# Patient Record
Sex: Male | Born: 1960 | Race: White | Hispanic: No | Marital: Single | Smoking: Current every day smoker
Health system: Southern US, Community
[De-identification: ages and names within clinical notes are randomized; demographics above are authoritative.]

## PROBLEM LIST (undated history)

## (undated) DIAGNOSIS — M545 Low back pain, unspecified: Secondary | ICD-10-CM

## (undated) DIAGNOSIS — I10 Essential (primary) hypertension: Secondary | ICD-10-CM

## (undated) DIAGNOSIS — B019 Varicella without complication: Secondary | ICD-10-CM

## (undated) DIAGNOSIS — I739 Peripheral vascular disease, unspecified: Secondary | ICD-10-CM

## (undated) DIAGNOSIS — E139 Other specified diabetes mellitus without complications: Secondary | ICD-10-CM

## (undated) DIAGNOSIS — Z720 Tobacco use: Secondary | ICD-10-CM

## (undated) HISTORY — PX: INTRAOCULAR LENS INSERTION: SHX110

## (undated) HISTORY — DX: Varicella without complication: B01.9

## (undated) HISTORY — PX: CATARACT EXTRACTION: SUR2

## (undated) HISTORY — DX: Essential (primary) hypertension: I10

## (undated) HISTORY — PX: RETINAL DETACHMENT SURGERY: SHX105

## (undated) HISTORY — DX: Peripheral vascular disease, unspecified: I73.9

## (undated) HISTORY — DX: Other specified diabetes mellitus without complications: E13.9

## (undated) HISTORY — DX: Tobacco use: Z72.0

## (undated) HISTORY — DX: Low back pain, unspecified: M54.50

## (undated) HISTORY — DX: Low back pain: M54.5

---

## 1999-08-23 ENCOUNTER — Inpatient Hospital Stay (HOSPITAL_COMMUNITY): Admission: EM | Admit: 1999-08-23 | Discharge: 1999-08-24 | Payer: Self-pay | Admitting: Emergency Medicine

## 1999-08-24 ENCOUNTER — Encounter: Payer: Self-pay | Admitting: Internal Medicine

## 2000-05-19 ENCOUNTER — Inpatient Hospital Stay (HOSPITAL_COMMUNITY): Admission: EM | Admit: 2000-05-19 | Discharge: 2000-05-20 | Payer: Self-pay | Admitting: Emergency Medicine

## 2001-03-21 ENCOUNTER — Ambulatory Visit (HOSPITAL_COMMUNITY): Admission: RE | Admit: 2001-03-21 | Discharge: 2001-03-21 | Payer: Self-pay | Admitting: Family Medicine

## 2001-03-21 ENCOUNTER — Encounter: Payer: Self-pay | Admitting: Family Medicine

## 2003-04-28 ENCOUNTER — Emergency Department (HOSPITAL_COMMUNITY): Admission: EM | Admit: 2003-04-28 | Discharge: 2003-04-28 | Payer: Self-pay | Admitting: Emergency Medicine

## 2003-06-29 ENCOUNTER — Inpatient Hospital Stay (HOSPITAL_COMMUNITY): Admission: EM | Admit: 2003-06-29 | Discharge: 2003-07-01 | Payer: Self-pay | Admitting: Emergency Medicine

## 2004-01-13 ENCOUNTER — Emergency Department (HOSPITAL_COMMUNITY): Admission: EM | Admit: 2004-01-13 | Discharge: 2004-01-14 | Payer: Self-pay | Admitting: Emergency Medicine

## 2004-02-02 ENCOUNTER — Inpatient Hospital Stay (HOSPITAL_COMMUNITY): Admission: EM | Admit: 2004-02-02 | Discharge: 2004-02-04 | Payer: Self-pay | Admitting: Emergency Medicine

## 2005-03-07 ENCOUNTER — Emergency Department (HOSPITAL_COMMUNITY): Admission: EM | Admit: 2005-03-07 | Discharge: 2005-03-07 | Payer: Self-pay | Admitting: Family Medicine

## 2006-11-16 ENCOUNTER — Encounter
Admission: RE | Admit: 2006-11-16 | Discharge: 2007-02-14 | Payer: Self-pay | Admitting: Physical Medicine & Rehabilitation

## 2006-11-21 ENCOUNTER — Ambulatory Visit: Payer: Self-pay | Admitting: Physical Medicine & Rehabilitation

## 2007-07-16 ENCOUNTER — Encounter: Admission: RE | Admit: 2007-07-16 | Discharge: 2007-09-03 | Payer: Self-pay | Admitting: Anesthesiology

## 2007-11-28 ENCOUNTER — Ambulatory Visit: Payer: Self-pay | Admitting: Vascular Surgery

## 2009-09-08 ENCOUNTER — Encounter: Admission: RE | Admit: 2009-09-08 | Discharge: 2009-09-08 | Payer: Self-pay | Admitting: Endocrinology

## 2010-08-20 ENCOUNTER — Encounter
Admission: RE | Admit: 2010-08-20 | Discharge: 2010-08-20 | Payer: Self-pay | Source: Home / Self Care | Attending: Endocrinology | Admitting: Endocrinology

## 2010-09-17 ENCOUNTER — Other Ambulatory Visit: Payer: Self-pay | Admitting: Endocrinology

## 2010-09-21 ENCOUNTER — Other Ambulatory Visit: Payer: Self-pay | Admitting: Endocrinology

## 2010-09-21 DIAGNOSIS — T07XXXA Unspecified multiple injuries, initial encounter: Secondary | ICD-10-CM

## 2010-10-18 ENCOUNTER — Ambulatory Visit
Admission: RE | Admit: 2010-10-18 | Discharge: 2010-10-18 | Disposition: A | Discharge status: Home / Self Care | Payer: Medicare Other | Source: Ambulatory Visit | Attending: Endocrinology | Admitting: Endocrinology

## 2010-10-18 DIAGNOSIS — T07XXXA Unspecified multiple injuries, initial encounter: Secondary | ICD-10-CM

## 2010-12-24 NOTE — Discharge Summary (Signed)
NAME:  Peter Mendez, Peter Mendez                             ACCOUNT NO.:  [id number]   MEDICAL RECORD NO.:  [id number]                   PATIENT TYPE:  INP   LOCATION:  2023                                 FACILITY:  [facility]   PHYSICIAN:  Franchot Mimes, MD                   DATE OF BIRTH:  01-Oct-1960   DATE OF ADMISSION:  02/02/2004  DATE OF DISCHARGE:  02/04/2004                                 DISCHARGE SUMMARY   ATTENDING PHYSICIAN:  Dr. Lowella Bandy.   DATE OF ADMISSION:  February 02, 2004.   DATE OF DISCHARGE:  February 04, 2004.   DISCHARGE DIAGNOSES:  1. Diabetic ketoacidosis.  2. History of insulin dependent diabetes mellitus.  3. Acute renal failure.  4. Hypokalemia.  5. Hypertension.  6. 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   DISCHARGE MEDICATIONS:  1. Novolin N 30 units in A.M. and 15 units in P.M.  2. Novolin R 15 units in A.M. and 7 units in P.M.  3. Benazepril 10 mg p.o. q.d.  4. Xanax as needed.  5. Percocet as needed for pain.   DISPOSITION:  The patient is discharged home.   FOLLOW UP:  The patient is to follow up with Dr. Luciana Axe for follow-up of his  diabetes at the [facility].  In addition, the patient is to follow  up at [facility] for his continued eye care.   BRIEF HISTORY OF PRESENT ILLNESS:  Mr. Loudermilk is a 50 year old Caucasian male  who presented to [facility] Emergency Department with a three to four day  history of diarrhea, nausea and vomiting.  The patient states that following  his dinner on Friday, which was three days prior to admission, he began to  experience diarrhea.  On Sunday, two days later, he began to experience  vomiting and became dehydrated.  On the day of admission, he noticed that he  was starting to feel very sick and that he was probably going into acidosis,  thus prompting his visit to the emergency department.  The patient does not  report any missed insulin doses or running out of his medications.  He does  not report any  chest pain or shortness of breath with these episodes.  In  addition, he does not report any other fevers.  However, he was around his  mother and sister who had been having upper respiratory symptoms the few  days prior.  However, they had not been having diarrhea.  Thus, there is no  known trigger for his acidosis spell.   PHYSICAL EXAMINATION:  VITAL SIGNS:  Temperature 98.7, blood pressure  102/62, pulse 90, respirations 20, oxygen sat 98% on room air.  GENERAL:  Pleasant.  Awake, alert and oriented x3.  The patient was not in  any apparent distress.  HEENT:  The patient's pupils are minimally reactive.  There is edema  in the  right fundus with poor visualization of the vessels.  There is no  visualization of vessels with edema in the left fundus.  Oropharyngeal exam  revealed dry membranes with false teeth.  NECK:  No JVD, no lymphadenopathy and no thyromegaly.  LUNGS:  Respirations were clear to auscultation bilaterally.  CARDIOVASCULAR:  Regular rate with normal S1 and S2 and no murmurs.  GI:  Soft, nontender and nondistended.  There was no right upper quadrant  tenderness and no hepatosplenomegaly appreciated.  EXTREMITIES:  No edema with +2 dorsalis pedis pulses.  SKIN:  No ulcerations.  However, there were multiple bilateral ecchymoses of  the lower extremities.  The patient is rather tan in his upper extremities.  NEUROLOGIC:  Cranial nerves II-XII are intact.  Detailed exam:  The  patient's gross sensation is slightly decreased in his lower extremities and  he does complain of occasional neuropathic pain in his lower extremities.  His strength was grossly normal.   ADMISSION LABORATORY DATA:  Admission ABG:  His pH 7.30, pCO2 33, pO2 102,  bicarb 16 on room air.  Sodium 143, potassium 4.0, chloride 103, bicarb 19,  BUN 36, creatinine 2.0, glucose 361.  White blood cell count 12, hemoglobin  15.8, BUN 46, platelets 273.  AST 15, ALT 13, protein 5.8, albumin 3.3,  bilirubin  2.3, alkaline phosphatase 62.  The patient had an anion gap of 21.  He had a large amount of acetone in his blood.  Urinalysis revealed a  specific gravity of 1.028, greater than 1,000 glucose, greater than 80  ketone, 30 of protein with negative nitrite and negative leukocyte esterase.   DIAGNOSES:  1. Diabetic ketoacidosis.  The patient had a very mild ketoacidosis on     presentation.  He was started on an insulin drip at 6 units per hour with     CBGs checked every hour.  The patient was given 4 liters of normal saline     while in the emergency department and then was started on 200 cc of     normal saline for IV fluid resuscitation.  Once the patient's glucose was     less than 300, the IV fluids were changed to D5 1/2 normal saline.  The     patient's glucose continued to follow and the insulin drip was adjusted     accordingly.  Once the patient's bicarb was greater than 20, the insulin     drip was continued for one additional hour and then stopped.  At that     time, he was given 10 units of NPH and his CBGs were checked every four     hours with sliding scale of regular insulin.  The following morning after     admission the patient had no nausea, was feeling much better and his     anion gap had completely closed and his bicarb was normal.  Thus we     advanced his diet as tolerated.  The patient advanced his diet without     complication.  On the day of discharge, the patient's  anion gap was 4     and his bicarb was 31.  He did have a slightly low potassium at 3.2.  He     also had potassium in his IV fluids and this was also replaced orally.     On the day of discharge, the patient was on his home regimen of insulin     with adequate control  of his blood sugars.  2. Acute renal failure.  Following aggressive IV fluid resuscitation, the     patient's creatinine decreased from 2.0 to a level of 0.9 on discharge.    A microalbumin was obtained and the patient was found to have  30 mg per     24 hours of albumin in his urine.  Thus, there is no concern for     nephropathic disease.  3. Hypokalemia.  As stated above, the patient's potassium was 3.2 at     discharge.  The patient will be given a small amount of potassium for     continued supplementation as an outpatient.   ADDITIONAL LABORATORY DATA:  The patient's hemoglobin A1c on admission was  10.7.                                                Franchot Mimes, MD    TV/MEDQ  D:  02/04/2004  T:  02/04/2004  Job:  [id number]   cc:   Fanny Dance. Rankins, M.D.  [address]  [place]  [place] [address]  Fax: [phone]
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
## 2010-12-24 NOTE — Discharge Summary (Signed)
NAME:  Peter Mendez, Peter Mendez                             ACCOUNT NO.:  [id number]   MEDICAL RECORD NO.:  [id number]                   PATIENT TYPE:  INP   LOCATION:  2905                                 FACILITY:  [facility]   PHYSICIAN:  Alvester Morin, M.D.               DATE OF BIRTH:  October 13, 1960   DATE OF ADMISSION:  06/29/2003  DATE OF DISCHARGE:  07/01/2003                                 DISCHARGE SUMMARY   DISCHARGE DIAGNOSES:  1. Diabetes mellitus, insulin dependent--diabetic ketoacidosis.  2. Nausea and vomiting.  3. History of febrile illness.  4. Anemia.  5. Hypertension (?).   DISCHARGE MEDICATIONS:  1. Ten units of Regular insulin q.a.m., 30 units of NPH insulin q.a.m.; 16     units of NPH q.p.m., 10 units of Regular insulin q.p.m.  2. Xanax 1 mg t.i.d.  3. Percocet 10/325 mg one t.i.d. p.r.n. pain.   ADMITTING HISTORY AND PHYSICAL:  The patient presented to the ED with a four-  day history of nausea, vomiting and diarrhea.  The patient reports that he  was unable to give himself insulin due to blurry vision and blindness in his  left eye.  He reports a positive history of fever and chills during this  illness.   HOSPITAL COURSE:  PROBLEM #1 - DIABETIC KETOACIDOSIS:  The patient was  appropriately hydrated with normal saline and subsequently half normal  saline with D-5.  A 10-unit insulin bolus was given upon admission and then  the patient was started on an insulin drip.  Serum electrolytes were monitor  and repleted as necessary.  The patient was given subcu insulin and  subsequently the insulin drip was discontinued on discharge.  The patient  was discharged after tolerating adequate p.o. intake on his home insulin  regimen.  Labs on the day of discharge:  CMET was as follows on insulin  drip:  Sodium 135, potassium 3.4, chloride 99, CO2 31, BUN 14, creatinine  0.8, glucose 150, anion gap of 5.  Please note that the patient's potassium  was repleted with 40 mEq of p.o.  K-Dur prior to discharge.   PROBLEM #2 - NAUSEA AND VOMITING:  Initially, the patient was treated with  IV Phenergan 12.5 mg q.6 h. p.r.n., however, nausea, vomiting and diarrhea  symptoms resolved within the first 24 hours of admission.   PROBLEM #3 - HISTORY OF FEBRILE ILLNESS:  The patient was cultured, blood  cultures x2.  Chest film, UA and CBC with differential.  Blood cultures are  negative to date and chest x-ray showed no acute disease.  Urinalysis was  positive for greater than 1000 of glucose and a small amount of hemoglobin,  moderate bilirubin and greater than 80 ketones and 30 of protein; this is  likely due to his state of DKA, but the UA was negative for leukocyte  esterase and nitrite and had 0 to  2 white blood cells, so it was not felt  that he had a urinary tract infection at the time that can be attributed to  his signs and symptoms of febrile illness.  CBC with differential:  Initial  CBC had a white count of 13, hemoglobin 16.9, hematocrit 50.6 and a platelet  count of 257,000 with an absolute neutrophil count of 11.5.  His MCV at that  time was 89.0.  The patient did have an elevated white count, however, he  was dehydrated and in a state of DKA, which also can cause an elevation in  the white count.  He did have an elevated absolute neutrophil count at that  time.  This was monitored closely and on day of discharge, the patient had a  normal white count of 7.6, a hemoglobin of 12.5, hematocrit 36 and platelet  count of 147,000.  The patient is mildly anemic at 12.5 and this may need to  be followed by his primary care physician, Dr. Fanny Dance. Rankins, at  [facility].   PROBLEM #4 - ? ANEMIA:  The patient had a hemoglobin of 12.5 on discharge,  which is a mild anemia.  He will be followed by his primary care physician,  Dr. Barbaraann Barthel, at [facility].   PROBLEM #5 - HYPERTENSION:  The patient did have some elevated blood  pressures during his stay with ranges on  the first day of admission from 120  to 160s over 60s to 70s and on day of discharge, 140s over 60s to 70s and  this should be followed by his primary care physician, Dr. Barbaraann Barthel, at  [facility].   DISPOSITION:  The patient is discharged to home in stable condition with the  following instructions.   FOLLOWUP:  [name] to follow up at [facility] Tuesday morning at  8:30 a.m. on the July 08, 2003 with his primary care physician, Dr  Barbaraann Barthel, and also with the diabetes coordinator there.  This appointment has  already been established for the patient.   DIET AND ACTIVITY:  [name] to follow a diabetic-prudent diet and to return to  activity as normal.      Donald Pore, MD                          Alvester Morin, M.D.    HP/MEDQ  D:  07/01/2003  T:  07/01/2003  Job:  [id number]

## 2011-11-15 ENCOUNTER — Ambulatory Visit: Payer: Medicare Other | Attending: Ophthalmology | Admitting: Occupational Therapy

## 2011-11-15 DIAGNOSIS — E11319 Type 2 diabetes mellitus with unspecified diabetic retinopathy without macular edema: Secondary | ICD-10-CM | POA: Insufficient documentation

## 2011-11-15 DIAGNOSIS — H5410 Blindness, one eye, low vision other eye, unspecified eyes: Secondary | ICD-10-CM | POA: Insufficient documentation

## 2011-11-15 DIAGNOSIS — E1139 Type 2 diabetes mellitus with other diabetic ophthalmic complication: Secondary | ICD-10-CM | POA: Insufficient documentation

## 2011-11-15 DIAGNOSIS — IMO0001 Reserved for inherently not codable concepts without codable children: Secondary | ICD-10-CM | POA: Insufficient documentation

## 2013-02-20 ENCOUNTER — Other Ambulatory Visit (INDEPENDENT_AMBULATORY_CARE_PROVIDER_SITE_OTHER): Payer: Medicare Other

## 2013-02-20 ENCOUNTER — Other Ambulatory Visit: Payer: Self-pay | Admitting: *Deleted

## 2013-02-20 DIAGNOSIS — E1065 Type 1 diabetes mellitus with hyperglycemia: Secondary | ICD-10-CM

## 2013-02-20 DIAGNOSIS — IMO0002 Reserved for concepts with insufficient information to code with codable children: Secondary | ICD-10-CM

## 2013-02-20 DIAGNOSIS — E119 Type 2 diabetes mellitus without complications: Secondary | ICD-10-CM

## 2013-02-20 LAB — COMPREHENSIVE METABOLIC PANEL
ALT: 13 U/L (ref 0–53)
AST: 22 U/L (ref 0–37)
Albumin: 4 g/dL (ref 3.5–5.2)
BUN: 7 mg/dL (ref 6–23)
CO2: 29 mEq/L (ref 19–32)
Calcium: 9.1 mg/dL (ref 8.4–10.5)
Chloride: 95 mEq/L — ABNORMAL LOW (ref 96–112)
Creatinine, Ser: 0.8 mg/dL (ref 0.4–1.5)
GFR: 102.81 mL/min (ref >60.00)
Potassium: 5 mEq/L (ref 3.5–5.1)

## 2013-02-20 LAB — MICROALBUMIN / CREATININE URINE RATIO
Microalb Creat Ratio: 2.6 mg/g (ref 0.0–30.0)
Microalb, Ur: 4.6 mg/dL — ABNORMAL HIGH (ref 0.0–1.9)

## 2013-02-20 LAB — URINALYSIS
Bilirubin Urine: NEGATIVE
Specific Gravity, Urine: 1.02 (ref 1.000–1.030)
Total Protein, Urine: NEGATIVE
pH: 6 (ref 5.0–8.0)

## 2013-02-20 LAB — HEMOGLOBIN A1C: Hgb A1c MFr Bld: 8.2 % — ABNORMAL HIGH (ref 4.6–6.5)

## 2013-02-25 ENCOUNTER — Ambulatory Visit (INDEPENDENT_AMBULATORY_CARE_PROVIDER_SITE_OTHER): Payer: Medicare Other | Admitting: Endocrinology

## 2013-02-25 ENCOUNTER — Encounter: Payer: Self-pay | Admitting: Endocrinology

## 2013-02-25 VITALS — BP 128/70 | HR 63 | Ht 70.0 in | Wt 151.6 lb

## 2013-02-25 DIAGNOSIS — E871 Hypo-osmolality and hyponatremia: Secondary | ICD-10-CM | POA: Insufficient documentation

## 2013-02-25 DIAGNOSIS — E7800 Pure hypercholesterolemia, unspecified: Secondary | ICD-10-CM | POA: Insufficient documentation

## 2013-02-25 DIAGNOSIS — I10 Essential (primary) hypertension: Secondary | ICD-10-CM | POA: Insufficient documentation

## 2013-02-25 DIAGNOSIS — E1065 Type 1 diabetes mellitus with hyperglycemia: Secondary | ICD-10-CM

## 2013-02-25 DIAGNOSIS — E139 Other specified diabetes mellitus without complications: Secondary | ICD-10-CM | POA: Insufficient documentation

## 2013-02-25 DIAGNOSIS — E1049 Type 1 diabetes mellitus with other diabetic neurological complication: Secondary | ICD-10-CM | POA: Insufficient documentation

## 2013-02-25 NOTE — Progress Notes (Signed)
Patient ID: Peter Mendez, male   DOB: May 19, 1961, 52 y.o.   MRN: [id number]  Reason for Appointment: Diabetes follow-up   History of Present Illness    Diagnosis: Type 1 diabetes mellitus.   Complications: retinopathy, peripheral neuropathy.  Diabetic nephropathy: None, Last microalbumin normal.   Insulin delivery device: Pens.  Monitors blood glucose: 6.5 times a day.  Glucometer: One Touch.   Blood Glucose readings: Overall median 178 Highest blood sugars are after about 7 PM with median 206 at bedtime and also readings with averages about 200 late evening and overnight as well as early morning Blood sugars are the lowest before supper with median 127  Hypoglycemia frequency: None recently, was having some low readings may be about 2 weeks ago along with one reading of 64 at 2 AM  Hypoglycemic awareness: Usually develops symptoms when blood glucose is less than 50, usually sweating.   Meals: Inconsistent schedule, Inconsistent quantity and sometimes eating snacks at night.  Carbohydrate intake: at snacks: some during the night with milk. Also diet may be high in fat at times.  Physical activity: exercise: walking very little.  Dietician visit: Most recent:, 4/09.   His A1c previously was consistently around 7.3 along with  marked fluctuation in blood sugars. However it is now relatively higher with somewhat higher readings recently especially overnight [name] still trying to monitor about 7 times a day to help with control but blood sugars can be as high as 435 and as low as 36 Highest blood sugars are late evening and overnight as well as some in the morning lately He has not increased his Lantus even with his morning sugars being high Some of  the high readings at breakfast may be from eating a snack during the night without taking any insulin and also not getting enough insulin to cover his evening meal  He was switched from NovoLog and Humalog because of insurance preference. Overall  he thinks he is needing to take more Humalog and NovoLog around mealtime for control  The insulin regimen is described as premeal rapid analog 6-8 usually, Lantus 35 in a.m. and 23 p.m.Marland Kitchen  Prior A1c readings usually 7.2-7.4  Hypertension:  Here for hypertension followup: BP is now well-controlled with good reading today. Current regimen is  ramipril 10 mg. 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 Cortrosyn stimulation test was also normal Fluid intake has been better restricted and he is not getting Gatorade and other drinks as much. May drink milk at night. Urinary sodium previously was 138 and U Osm 286 in 3/13. Demeclocycline was prescribed but is not covered by insurance. Again currently asymptomatic and has no nausea or weight loss.    Appointment on 02/20/2013  Component Date Value Range Status  . Hemoglobin A1C 02/20/2013 8.2* 4.6 - 6.5 % Final   Glycemic Control Guidelines for People with Diabetes:Non Diabetic:  <6%Goal of Therapy: <7%Additional Action Suggested:  >8%   . Microalb, Ur 02/20/2013 4.6* 0.0 - 1.9 mg/dL Final  . Creatinine,U 40/98/1191 180.0   Final  . Microalb Creat Ratio 02/20/2013 2.6  0.0 - 30.0 mg/g Final  . Color,  Urine 02/20/2013 YELLOW  Yellow;Lt. Yellow Final  . APPearance 02/20/2013 CLEAR  Clear Final  . Specific Gravity, Urine 02/20/2013 1.020  1.000-1.030 Final  . pH 02/20/2013 6.0  5.0 - 8.0 Final  . Total Protein, Urine 02/20/2013 NEGATIVE  Negative Final  . Urine Glucose 02/20/2013 NEGATIVE  Negative Final  . Ketones, ur 02/20/2013 TRACE  Negative Final  . Bilirubin Urine  02/20/2013 NEGATIVE  Negative Final  . Hgb urine dipstick 02/20/2013 NEGATIVE  Negative Final  . Urobilinogen, UA 02/20/2013 1.0  0.0 - 1.0 Final  . Leukocytes, UA 02/20/2013 NEGATIVE  Negative Final  . Nitrite 02/20/2013 NEGATIVE  Negative Final  . Sodium 02/20/2013 130* 135 - 145 mEq/L Final  . Potassium 02/20/2013 5.0  3.5 - 5.1 mEq/L Final  . Chloride 02/20/2013 95* 96 - 112 mEq/L Final  . CO2 02/20/2013 29  19 - 32 mEq/L Final  . Glucose, Bld 02/20/2013 139* 70 - 99 mg/dL Final  . BUN 78/46/9629 7  6 - 23 mg/dL Final  . Creatinine, Ser 02/20/2013 0.8  0.4 - 1.5 mg/dL Final  . Total Bilirubin 02/20/2013 0.5  0.3 - 1.2 mg/dL Final  . Alkaline Phosphatase 02/20/2013 63  39 - 117 U/L Final  . AST 02/20/2013 22  0 - 37 U/L Final  . ALT 02/20/2013 13  0 - 53 U/L Final  . Total Protein 02/20/2013 7.3  6.0 - 8.3 g/dL Final  . Albumin 52/84/1324 4.0  3.5 - 5.2 g/dL Final  . Calcium 40/05/2724 9.1  8.4 - 10.5 mg/dL Final  . GFR 36/64/4034 102.81  >60.00 mL/min Final      Medication List       This list is accurate as of: 02/25/13  2:38 PM.  Always use your most recent med list.               alprazolam 2 MG tablet  Commonly known as:  XANAX  2 mg.     HUMALOG KWIKPEN 100 UNIT/ML Sopn  Generic drug:  insulin lispro     LANTUS SOLOSTAR 100 UNIT/ML Sopn  Generic drug:  Insulin Glargine  34 Units.     oxycodone 30 MG immediate release tablet  Commonly known as:  ROXICODONE     pravastatin 40 MG tablet  Commonly known as:  PRAVACHOL     ramipril 10 MG capsule  Commonly known as:  ALTACE        Allergies: No Known Allergies  No past medical history on file.  No past surgical history on file.  No family history on file.  Social History:  reports that he has been smoking.  He does not have any smokeless tobacco history on file. His alcohol and drug histories are not on file.  Review of Systems   Chronic back pain Visual loss from retinopathy He has had symptoms  of neuropathy    Examination:   BP 128/70  Pulse 63  Ht 5\' 10"  (1.778 m)  Wt 151 lb 9.6 oz (68.765 kg)  BMI 21.75 kg/m2  SpO2 98%  Body mass index is 21.75 kg/(m^2).   Assesment:   1. Diabetes type 2, uncontrolled - 250.02  The patient's diabetes control appears to be not as well controlled with higher A1c  Highest blood sugars around 6-a.m. overall with median 235 which he thinks is from eating a snack or drinking milk during the night without taking any coverage. However may also have an element of a dawn effect. Also has occasional blood  sugars in the 40s, usually before noon or once at 4 p.m.. Overall he thinks he is taking a little more Humalog and NovoLog 4 mealtime control but since his overall control is similar will not request a change from insurance company. [name] taking fairly consistent amounts of Lantus for quite some time and will continue the same doses for now. Will avoiding treating evening doses because of some n low readings overnight also. Suppertime readings are generally fairly good also.   2.  HYPONATREMIA: on his blood test the sodium is now 130 compared to 132 previously. Has not changed his diet or fluid intake and is more consistently normal now. Also was previously  asymptomatic with lower levels and we'll continue to observe   3. Hypertension is well controlled  PLAN:   Robb Sibal 02/25/2013, 2:38 PM

## 2013-02-25 NOTE — Patient Instructions (Signed)
Humalog 6-8 at supper  Lantus 36 in am and 25 in pm  If eating during night take 2-3 units Humalog

## 2013-04-09 ENCOUNTER — Other Ambulatory Visit: Payer: Self-pay | Admitting: *Deleted

## 2013-04-09 MED ORDER — INSULIN GLARGINE 100 UNIT/ML SOLOSTAR PEN: 34.0000 [IU] | PEN_INJECTOR | Freq: Two times a day (BID) | SUBCUTANEOUS | Status: DC

## 2013-04-09 MED ORDER — "PEN NEEDLES 5/16"" 31G X 8 MM MISC": Status: DC

## 2013-04-09 MED ORDER — INSULIN LISPRO 100 UNIT/ML (KWIKPEN): PEN_INJECTOR | SUBCUTANEOUS | Status: DC

## 2013-04-09 MED ORDER — GLUCOSE BLOOD VI STRP: ORAL_STRIP | Status: DC

## 2013-04-12 ENCOUNTER — Telehealth: Payer: Self-pay | Admitting: Endocrinology

## 2013-04-12 MED ORDER — GLUCOSE BLOOD VI STRP: ORAL_STRIP | Status: DC

## 2013-04-12 NOTE — Telephone Encounter (Signed)
Call pharmacy, faxed over a corrected version of his test strips, his insurance will only pay for 5 pens of Lantus at one time.  Tried calling patient back but no answer

## 2013-04-29 ENCOUNTER — Encounter: Payer: Self-pay | Admitting: Endocrinology

## 2013-04-29 ENCOUNTER — Ambulatory Visit (INDEPENDENT_AMBULATORY_CARE_PROVIDER_SITE_OTHER): Payer: Medicare Other | Admitting: Endocrinology

## 2013-04-29 VITALS — BP 130/70 | HR 75 | Temp 98.4°F | Ht 70.0 in | Wt 145.9 lb

## 2013-04-29 DIAGNOSIS — IMO0002 Reserved for concepts with insufficient information to code with codable children: Secondary | ICD-10-CM

## 2013-04-29 DIAGNOSIS — E871 Hypo-osmolality and hyponatremia: Secondary | ICD-10-CM

## 2013-04-29 DIAGNOSIS — I10 Essential (primary) hypertension: Secondary | ICD-10-CM

## 2013-04-29 DIAGNOSIS — E1065 Type 1 diabetes mellitus with hyperglycemia: Secondary | ICD-10-CM

## 2013-04-29 DIAGNOSIS — E7800 Pure hypercholesterolemia, unspecified: Secondary | ICD-10-CM

## 2013-04-29 LAB — MICROALBUMIN / CREATININE URINE RATIO
Creatinine,U: 118.7 mg/dL
Microalb, Ur: 5.6 mg/dL — ABNORMAL HIGH (ref 0.0–1.9)

## 2013-04-29 LAB — URINALYSIS, ROUTINE W REFLEX MICROSCOPIC
Hgb urine dipstick: NEGATIVE
Leukocytes, UA: NEGATIVE
Nitrite: NEGATIVE
Specific Gravity, Urine: 1.02 (ref 1.000–1.030)
Urobilinogen, UA: 0.2 (ref 0.0–1.0)

## 2013-04-29 NOTE — Patient Instructions (Addendum)
Add 1-2 units at lunch if eating starchy foods  Cut smoking down  Mark sugar after meals on meter

## 2013-04-29 NOTE — Progress Notes (Signed)
Patient ID: Peter Mendez, male   DOB: 1960-12-24, 52 y.o.   MRN: [id number]  Reason for Appointment: Diabetes follow-up   History of Present Illness   Diagnosis: Type 1 diabetes mellitus, diagnosis 1991.  He has been on basal bolus insulin regimen for a few years His A1c previously was consistently around 7.3 along with marked fluctuation in blood sugars. However A1c has been relatively higher this year.  On his last visit he was advised to cover his snacks better and he is also avoiding snacks during the night. Although blood sugars are averaging 168 he still has significant fluctuation [name] still trying to monitor about 6-7 times a day to help with control  His and median blood sugar is usually about 170 most of the time except lower late morning and midday Highest blood sugars are late evening and overnight as well as some in the morning again More recently he has had relatively high readings after lunch and has not increased the coverage for his meal. Usually compliant with taking the NovoLog with his meal and trying to adjust it based on meal size  The insulin regimen is described as premeal rapid analog 6-8 usually, Lantus 35 in a.m. and 23 p.m..  Insulin delivery device: Pens.  Monitors blood glucose: 6.5 times a day.  Glucometer: One Touch.   Blood Glucose readings: Overall median 167, previously 178 with median about 170-175 except late morning and mid day Recent readings before breakfast 123-229, midday 79-224, afternoon recently 149-229. Early evening 60-253 and late evening/bedtime 75-284 Overnight 56-337  Hypoglycemia frequency: None recently, was having some low readings may be about 2 weeks ago along with one reading of 64 at 2 AM  Hypoglycemic awareness: Usually develops symptoms when blood glucose is less than 50, usually sweating.   Meals: Inconsistent schedule, Inconsistent quantity and sometimes eating snacks at night. bfst 7-9 am Supper 6-7 pm  1/2 sandwich hs;  no  night snack Carbohydrate intake: at snacks: some during the night with milk. More chicken [place]  Physical activity: exercise: walking some.  Dietician visit: Most recent:, 4/09.   Prior A1c readings usually 7.2-7.4 Complications: retinopathy, peripheral neuropathy.  Diabetic nephropathy: None, Last microalbumin normal.   Hypertension:  Here for hypertension followup: BP is now well-controlled with good reading today. Current regimen is  ramipril 10 mg only. Home blood pressure checks about 130/60  HYPONATREMIA: Long-standing and it appears idiopathic. Appears sodium was consistently below 130 previously it is better more recently without any specific intervention. 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 Cortrosyn stimulation test was also normal Fluid intake has been better restricted and he is not drinking Gatorade as before. May drink milk at night. Urinary sodium previously was 138 and U Osm 286 in 3/13. Demeclocycline was prescribed but is not covered by insurance.      Medication List       This list is accurate as of: 04/29/13  3:13 PM.  Always use your most recent med list.               alprazolam 2 MG tablet  Commonly known as:  XANAX  2 mg.     glucose blood test strip  Use to check blood sugars 7 times per day dx code 250.03     Insulin Glargine 100 UNIT/ML Sopn  Commonly known as:  LANTUS SOLOSTAR  Inject 34 Units into the skin  2 (two) times daily.     insulin lispro 100 UNIT/ML Sopn  Commonly known as:  HUMALOG KWIKPEN  Use as directed     oxycodone 30 MG immediate release tablet  Commonly known as:  ROXICODONE     PEN NEEDLES 31GX5/16" 31G X 8 MM Misc  Use to check blood sugar 7 times per day     pravastatin 40 MG tablet  Commonly known as:  PRAVACHOL     ramipril 10 MG capsule  Commonly known as:  ALTACE        Allergies: No Known Allergies  History  reviewed. No pertinent past medical history.  History reviewed. No pertinent past surgical history.  History reviewed. No pertinent family history.  Social History:  reports that he has been smoking.  He does not have any smokeless tobacco history on file. His alcohol and drug histories are not on file.  Review of Systems   Chronic back pain Visual loss from retinopathy He has had symptoms of neuropathy    Examination:   BP 130/70  Pulse 75  Temp(Src) 98.4 F (36.9 C) (Oral)  Ht 5\' 10"  (1.778 m)  Wt 145 lb 14.4 oz (66.18 kg)  BMI 20.93 kg/m2  SpO2 96%  Body mass index is 20.93 kg/(m^2).   Assesment: 9/14  1. Diabetes type 2, uncontrolled - 250.02  The patient's diabetes control appears to be about the same as usual with fluctuation in blood sugars at all different times. His median reading is about 170 most of the time but relatively lower between about 9 AM-1 PM Most recently appears to be having significantly high readings mid afternoon from his lunch although readings around supper time are sometimes high also. Discussed needing 1 to 2 units at least more at lunch time especially if eating any starch [name] significantly afraid of hypoglycemia and probably did not take enough coverage for his meals and snacks especially late at night. FASTING readings are relatively better recently including a low sugar of 56 at 5 AM; also readings before supper or near normal last 3 days; for this reason will continue his Lantus unchanged   2.  HYPONATREMIA: This is the idiopathic. Possibly some degree of SIADH from taking chronic narcotic pain medications  Last level was 130 compared to 132 previously. Has not changed his diet or fluid intake. He been asymptomatic even with lower levels and will continue to observe   3. Hypertension is well controlled with ramipril alone  PLAN:   Peter Mendez 04/29/2013, 3:13 PM

## 2013-04-30 LAB — BASIC METABOLIC PANEL
BUN: 9 mg/dL (ref 6–23)
Chloride: 95 mEq/L — ABNORMAL LOW (ref 96–112)
Creatinine, Ser: 0.9 mg/dL (ref 0.4–1.5)
GFR: 91.22 mL/min (ref >60.00)
Potassium: 5.1 mEq/L (ref 3.5–5.1)

## 2013-04-30 LAB — LIPID PANEL
Cholesterol: 150 mg/dL (ref 0–200)
LDL Cholesterol: 93 mg/dL (ref 0–99)
Triglycerides: 34 mg/dL (ref 0.0–149.0)

## 2013-05-01 ENCOUNTER — Telehealth: Payer: Self-pay | Admitting: *Deleted

## 2013-05-01 NOTE — Telephone Encounter (Signed)
Left message to return call 

## 2013-05-01 NOTE — Telephone Encounter (Signed)
Message copied by Hermenia Bers on Wed May 01, 2013  2:19 PM ------      Message from: Reather Littler      Created: Wed May 01, 2013 12:52 PM       Notify A1c 8.0 ------

## 2013-07-29 ENCOUNTER — Ambulatory Visit (INDEPENDENT_AMBULATORY_CARE_PROVIDER_SITE_OTHER): Payer: Medicare Other | Admitting: Endocrinology

## 2013-07-29 ENCOUNTER — Encounter: Payer: Self-pay | Admitting: Endocrinology

## 2013-07-29 VITALS — BP 132/82 | HR 61 | Temp 98.2°F | Resp 12 | Ht 70.0 in | Wt 158.1 lb

## 2013-07-29 DIAGNOSIS — E1065 Type 1 diabetes mellitus with hyperglycemia: Secondary | ICD-10-CM

## 2013-07-29 DIAGNOSIS — E7800 Pure hypercholesterolemia, unspecified: Secondary | ICD-10-CM

## 2013-07-29 DIAGNOSIS — E871 Hypo-osmolality and hyponatremia: Secondary | ICD-10-CM

## 2013-07-29 DIAGNOSIS — I10 Essential (primary) hypertension: Secondary | ICD-10-CM

## 2013-07-29 DIAGNOSIS — E1049 Type 1 diabetes mellitus with other diabetic neurological complication: Secondary | ICD-10-CM

## 2013-07-29 LAB — COMPREHENSIVE METABOLIC PANEL
AST: 25 U/L (ref 0–37)
Albumin: 4.1 g/dL (ref 3.5–5.2)
BUN: 5 mg/dL — ABNORMAL LOW (ref 6–23)
CO2: 33 mEq/L — ABNORMAL HIGH (ref 19–32)
Calcium: 8.8 mg/dL (ref 8.4–10.5)
Chloride: 89 mEq/L — ABNORMAL LOW (ref 96–112)
Creatinine, Ser: 1 mg/dL (ref 0.4–1.5)
GFR: 81.83 mL/min (ref >60.00)
Glucose, Bld: 210 mg/dL — ABNORMAL HIGH (ref 70–99)
Potassium: 4.4 mEq/L (ref 3.5–5.1)

## 2013-07-29 NOTE — Progress Notes (Signed)
Patient ID: Peter Mendez, male   DOB: 07/13/1961, 52 y.o.   MRN: [id number]  Reason for Appointment: Diabetes follow-up   History of Present Illness   Diagnosis: Type 1 diabetes mellitus, diagnosis 1991.  He has been on basal bolus insulin regimen for a few years His A1c previously was consistently around 7.3 along with marked fluctuation in blood sugars. He has difficulty giving blood sugars consistent around mealtimes and estimating the amount of insulin needed for various kinds of foods A1c has been higher in 2014.  The insulin regimen is described as premeal rapid analog 6-8 usually, Lantus 35 in a.m. and 23 p.m..  On his last visit he was advised to cover his snacks better and also recommended avoiding snacks during the night without coverage. Current problems:  Significant variation in blood sugars especially around breakfast time. He tends to eat cereal at breakfast and appears to have some high readings with this  Blood sugars are trending higher in the afternoon with median reading 217 and this is the highest in the day; blood sugars are not always high right after lunch  Relatively high readings before supper  Tendency to low normal or low readings around lunchtime possibly from not getting enough protein at breakfast  Significant variability in blood sugars after supper although median reading is only 161. Has a most variability at this time  Not being able to estimate accurately how to cover his meals and snacks as he is not doing carbohydrate counting Usually compliant with taking the NovoLog with his meal and trying to adjust it based on meal size  [name] still trying to monitor about 6-7 times a day to help with control Blood Glucose readings:   PREMEAL Breakfast Lunch Dinner Bedtime Overall  Glucose range:  110-322   46-289   141-221   163-206    Mean/median:  208   126   176    176    POST-MEAL PC Breakfast PC Lunch PC Dinner  Glucose range:  56-290   104-312     Mean/median:   217     Hypoglycemia frequency: Infrequent recently, having occasional low sugars late morning or before lunch  Hypoglycemic awareness: Usually develops symptoms when blood glucose is less than 50, usually sweating.   Meals: Inconsistent schedule, Inconsistent quantity and sometimes eating "junk". Breakfast is at 6-8 am Supper 6-7 pm  Cereal in ams; 1/2 sandwich hs;  no night snack Carbohydrate intake: at snacks: some during the night with milk. Eating more chicken and [place], less meat  Physical activity: exercise: walking some.  Dietician visit: Most recent:, 4/09.   Wt Readings from Last 3 Encounters:  07/29/13 158 lb 1.6 oz (71.714 kg)  04/29/13 145 lb 14.4 oz (66.18 kg)  02/25/13 151 lb 9.6 oz (68.765 kg)    Lab Results  Component Value Date   HGBA1C 7.8* 07/29/2013   HGBA1C 8.0* 04/29/2013   HGBA1C 8.2* 02/20/2013   Lab Results  Component Value Date   MICROALBUR 5.6* 04/29/2013   LDLCALC 93 04/29/2013   CREATININE 1.0 07/29/2013    Complications: retinopathy, peripheral neuropathy.  Diabetic nephropathy: None, Last microalbumin normal.    HYPONATREMIA: Long-standing and it appears idiopathic. Appears sodium was consistently below 130 previously it is better more recently without any specific intervention. 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 Cortrosyn stimulation test was also normal Fluid intake has been better restricted  and he is not drinking Gatorade as before. May drink milk at night. Urinary sodium previously was 138 and U Osm 286 in 3/13. Demeclocycline was prescribed but is not covered by insurance.      Medication List       This list is accurate as of: 07/29/13  3:39 PM.  Always use your most recent med list.               alprazolam 2 MG tablet  Commonly known as:  XANAX  2 mg.     glucose blood test strip  Use to check blood sugars 7 times per  day dx code 250.03     Insulin Glargine 100 UNIT/ML Sopn  Commonly known as:  LANTUS SOLOSTAR  Inject 34 Units into the skin 2 (two) times daily.     insulin lispro 100 UNIT/ML Sopn  Commonly known as:  HUMALOG  4-6 Units. Use as directed     oxycodone 30 MG immediate release tablet  Commonly known as:  ROXICODONE     PEN NEEDLES 31GX5/16" 31G X 8 MM Misc  Use to check blood sugar 7 times per day     pravastatin 40 MG tablet  Commonly known as:  PRAVACHOL     ramipril 10 MG capsule  Commonly known as:  ALTACE        Allergies: No Known Allergies  No past medical history on file.  No past surgical history on file.  No family history on file.  Social History:  reports that he has been smoking.  He does not have any smokeless tobacco history on file. His alcohol and drug histories are not on file.  Review of Systems   Chronic back pain Visual loss from retinopathy He has had symptoms of neuropathy, diabetic foot exam on 07/29/13 HYPERTENSION: Well controlled, he also monitors periodically at drug store . Current regimen is  ramipril 10 mg only. Home blood pressure checks about 130/60   Examination:   BP 132/82  Pulse 61  Temp(Src) 98.2 F (36.8 C)  Resp 12  Ht 5\' 10"  (1.778 m)  Wt 158 lb 1.6 oz (71.714 kg)  BMI 22.69 kg/m2  SpO2 98%  Body mass index is 22.69 kg/(m^2).   Diabetic foot exam done No ankle edema present  Assesment/PLAN:  1. Diabetes type 2, uncontrolled - 250.02  The patient's diabetes control appears to be slightly better overall but with fluctuation in blood sugars at all different times. See history of present illness for detailed discussion of current blood sugar patterns and problems identified He does need to modify his breakfast and have more protein instead of eating cereal Also because of higher readings before supper will increase his LANTUS by 2 units in the morning, advised him to possibly lower the evening dose if overnight  readings start getting at least below 100 [name] significantly afraid of hypoglycemia and probably did not take enough coverage for his meals and snacks especially late at night. Does need to add 1-2 units more for eating out in higher fat meals   2.  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 Probably does better when he does restrict his fluids.  He been asymptomatic even with lower levels and will continue to monitor   3. Hypertension is well controlled with ramipril alone  4. Neuropathy: Symptoms are relatively mild, has only mild sensory loss on the left side   Doristine Shehan  07/29/2013, 3:39 PM    Addendum: A1c 7.8 Sodium is 128, needs better fluid restriction; consider followup chest x-ray also on next visit  Office Visit on 07/29/2013  Component Date Value Range Status  . Hemoglobin A1C 07/29/2013 7.8* 4.6 - 6.5 % Final   Glycemic Control Guidelines for People with Diabetes:Non Diabetic:  <6%Goal of Therapy: <7%Additional Action Suggested:  >8%   . Sodium 07/29/2013 128* 135 - 145 mEq/L Final  . Potassium 07/29/2013 4.4  3.5 - 5.1 mEq/L Final  . Chloride 07/29/2013 89* 96 - 112 mEq/L Final  . CO2 07/29/2013 33* 19 - 32 mEq/L Final  . Glucose, Bld 07/29/2013 210* 70 - 99 mg/dL Final  . BUN 16/05/9603 5* 6 - 23 mg/dL Final  . Creatinine, Ser 07/29/2013 1.0  0.4 - 1.5 mg/dL Final  . Total Bilirubin 07/29/2013 0.4  0.3 - 1.2 mg/dL Final  . Alkaline Phosphatase 07/29/2013 63  39 - 117 U/L Final  . AST 07/29/2013 25  0 - 37 U/L Final  . ALT 07/29/2013 15  0 - 53 U/L Final  . Total Protein 07/29/2013 6.8  6.0 - 8.3 g/dL Final  . Albumin 54/04/8118 4.1  3.5 - 5.2 g/dL Final  . Calcium 14/78/2956 8.8  8.4 - 10.5 mg/dL Final  . GFR 21/30/8657 81.83  >60.00 mL/min Final

## 2013-07-29 NOTE — Patient Instructions (Signed)
Lantus 36 in am and stay on 34 in pm unless 3 am sugar low  No cereal in am

## 2013-08-23 ENCOUNTER — Other Ambulatory Visit: Payer: Self-pay | Admitting: *Deleted

## 2013-08-23 MED ORDER — INSULIN GLARGINE 100 UNIT/ML SOLOSTAR PEN: 34.0000 [IU] | PEN_INJECTOR | Freq: Two times a day (BID) | SUBCUTANEOUS | Status: DC

## 2013-09-19 ENCOUNTER — Other Ambulatory Visit: Payer: Self-pay | Admitting: *Deleted

## 2013-09-19 ENCOUNTER — Telehealth: Payer: Self-pay | Admitting: Endocrinology

## 2013-09-19 MED ORDER — ONETOUCH ULTRASOFT LANCETS MISC: Status: DC

## 2013-09-19 NOTE — Telephone Encounter (Signed)
Pharmacy called and stated that pt needs and Rx for his one touch ultra soft lancets  Call [phone] [facility] on [address]   Thank You :)

## 2013-09-19 NOTE — Telephone Encounter (Signed)
rx sent

## 2013-10-28 ENCOUNTER — Other Ambulatory Visit: Payer: Medicare Other

## 2013-10-28 ENCOUNTER — Encounter: Payer: Self-pay | Admitting: Endocrinology

## 2013-10-28 ENCOUNTER — Ambulatory Visit (INDEPENDENT_AMBULATORY_CARE_PROVIDER_SITE_OTHER): Payer: Medicare Other | Admitting: Endocrinology

## 2013-10-28 VITALS — BP 138/70 | HR 67 | Temp 98.3°F | Resp 16 | Ht 70.0 in | Wt 159.4 lb

## 2013-10-28 DIAGNOSIS — E1049 Type 1 diabetes mellitus with other diabetic neurological complication: Secondary | ICD-10-CM

## 2013-10-28 DIAGNOSIS — I10 Essential (primary) hypertension: Secondary | ICD-10-CM

## 2013-10-28 DIAGNOSIS — E1065 Type 1 diabetes mellitus with hyperglycemia: Principal | ICD-10-CM

## 2013-10-28 DIAGNOSIS — E7800 Pure hypercholesterolemia, unspecified: Secondary | ICD-10-CM

## 2013-10-28 DIAGNOSIS — E871 Hypo-osmolality and hyponatremia: Secondary | ICD-10-CM

## 2013-10-28 LAB — URINALYSIS, ROUTINE W REFLEX MICROSCOPIC
BILIRUBIN URINE: NEGATIVE
Hgb urine dipstick: NEGATIVE
KETONES UR: NEGATIVE
LEUKOCYTES UA: NEGATIVE
Nitrite: NEGATIVE
PH: 6 (ref 5.0–8.0)
Specific Gravity, Urine: 1.01 (ref 1.000–1.030)
Total Protein, Urine: NEGATIVE
URINE GLUCOSE: NEGATIVE
Urobilinogen, UA: 0.2 (ref 0.0–1.0)

## 2013-10-28 LAB — BASIC METABOLIC PANEL
BUN: 6 mg/dL (ref 6–23)
CHLORIDE: 93 meq/L — AB (ref 96–112)
CO2: 32 meq/L (ref 19–32)
CREATININE: 0.8 mg/dL (ref 0.4–1.5)
Calcium: 8.8 mg/dL (ref 8.4–10.5)
GFR: 108.55 mL/min (ref >60.00)
Glucose, Bld: 113 mg/dL — ABNORMAL HIGH (ref 70–99)
POTASSIUM: 4.7 meq/L (ref 3.5–5.1)
Sodium: 129 mEq/L — ABNORMAL LOW (ref 135–145)

## 2013-10-28 LAB — MICROALBUMIN / CREATININE URINE RATIO
Creatinine,U: 59.6 mg/dL
MICROALB/CREAT RATIO: 1.2 mg/g (ref 0.0–30.0)
Microalb, Ur: 0.7 mg/dL (ref 0.0–1.9)

## 2013-10-28 LAB — LIPID PANEL
CHOL/HDL RATIO: 3
Cholesterol: 127 mg/dL (ref 0–200)
HDL: 46.3 mg/dL (ref >39.00)
LDL CALC: 75 mg/dL (ref 0–99)
TRIGLYCERIDES: 28 mg/dL (ref 0.0–149.0)
VLDL: 5.6 mg/dL (ref 0.0–40.0)

## 2013-10-28 LAB — HEMOGLOBIN A1C: HEMOGLOBIN A1C: 7.9 % — AB (ref 4.6–6.5)

## 2013-10-28 NOTE — Patient Instructions (Signed)
If sugar < 120 during night have only 4 oz juice or reg. Neita CarpSoda

## 2013-10-28 NOTE — Progress Notes (Addendum)
Patient ID: Peter Mendez, male   DOB: [birth year], 53 y.o.   MRN: [id number]   Reason for Appointment: Diabetes follow-up   History of Present Illness   Diagnosis: Type 1 diabetes mellitus, diagnosis 1991.  He has been on basal bolus insulin regimen for a few years His A1c previously was consistently around 7-7.5 He tends to have significant fluctuation in blood sugars at all times. He has difficulty keeping his blood sugars consistent with a meal coverage and estimating the amount of insulin needed for various kinds of foods since he does not count carbohydrates A1c has been higher since 2014.  The insulin regimen is described as premeal rapid analog 6-8 usually, Lantus 35 in a.m. and 23 p.m..  On his last visit his Lantus was increased by 2 units in the morning because of higher readings around supper time. Again he is still having mostly high readings in the early evenings but somewhat better in the last 3 days Current problems:  Significant variation in blood sugars at all times.   He tends to eat cereal at breakfast and appears to have some high readings with this  If the blood sugar is in the normal range during the night he will have been chiropractic present and this will raise his blood sugar at breakfast time  Blood sugars are overall the highest  in the afternoon with median reading 217 around 6 PM. His blood sugars are as high  after lunch  Only rare hypoglycemia with delayed mealtime coverage or or estimating his food intake in the evening Usually compliant with taking the NovoLog with his meal and trying to adjust it based on meal size  [name] still trying to monitor about 6-7 times a day to help with control Blood Glucose readings:  PREMEAL Breakfast Lunch Dinner  late p.m.  Overall  Glucose range:  94-277   65-268   102-259   46-281    Mean/median:      157    Hypoglycemia frequency: Infrequent recently, having occasional low sugars after supper  Hypoglycemic  awareness: Usually develops symptoms when blood glucose is less than 50, usually sweating.   Meals: Inconsistent schedule, Inconsistent quantity. Breakfast is at 6-8 am Supper 6-7 pm  Cereal in ams; 1/2 sandwich hs;  no night snack Carbohydrate intake: at snacks: some during the night with milk. Eating more chicken and [place], less meat  Physical activity: exercise: walking some.  Dietician visit: Most recent:, 4/09.   Wt Readings from Last 3 Encounters:  10/28/13 159 lb 6.4 oz (72.303 kg)  07/29/13 158 lb 1.6 oz (71.714 kg)  04/29/13 145 lb 14.4 oz (66.18 kg)    Lab Results  Component Value Date   HGBA1C 7.9* 10/28/2013   HGBA1C 7.8* 07/29/2013   HGBA1C 8.0* 04/29/2013   Lab Results  Component Value Date   MICROALBUR 0.7 10/28/2013   LDLCALC 75 10/28/2013   CREATININE 0.8 10/28/2013    Complications: retinopathy, peripheral neuropathy.  Diabetic nephropathy: None, Last microalbumin normal.    HYPONATREMIA: Long-standing and it appears idiopathic. Appears sodium was consistently below 130 previously it is better more recently without any specific intervention. 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 Cortrosyn stimulation test was also normal Fluid intake has been better restricted and he is not drinking Gatorade as before. May drink milk at night. Urinary sodium previously was 138 and U Osm 286 in 3/13. Demeclocycline  was prescribed but is not covered by insurance.      Medication List       This list is accurate as of: 10/28/13 11:59 PM.  Always use your most recent med list.               alprazolam 2 MG tablet  Commonly known as:  XANAX  2 mg.     glucose blood test strip  Use to check blood sugars 7 times per day dx code 250.03     Insulin Glargine 100 UNIT/ML Solostar Pen  Commonly known as:  LANTUS  Inject 34 Units into the skin 2 (two) times daily. 36 units in am and 34 units  in pm     insulin lispro 100 UNIT/ML KiwkPen  Commonly known as:  HUMALOG  Inject 6-8 Units into the skin 3 (three) times daily. Use as directed     onetouch ultrasoft lancets  Use as instructed to check blood sugars 7 times per day     oxycodone 30 MG immediate release tablet  Commonly known as:  ROXICODONE     PEN NEEDLES 31GX5/16" 31G X 8 MM Misc  Use to check blood sugar 7 times per day     pravastatin 40 MG tablet  Commonly known as:  PRAVACHOL     ramipril 10 MG capsule  Commonly known as:  ALTACE        Allergies: No Known Allergies  No past medical history on file.  No past surgical history on file.  No family history on file.  Social History:  reports that he has been smoking Cigarettes.  He has been smoking about 0.50 packs per day. He does not have any smokeless tobacco history on file. His alcohol and drug histories are not on file.  Review of Systems   Chronic back pain Visual loss from retinopathy He has had symptoms of neuropathy, diabetic foot exam on 07/29/13 HYPERTENSION: Well controlled, he also monitors periodically at drug store . Current regimen is  ramipril 10 mg only. Home blood pressure checks about 128-138 /70 Diabetic foot exam done in 12/14   Examination:   BP 138/70  Pulse 67  Temp(Src) 98.3 F (36.8 C)  Resp 16  Ht 5\' 10"  (1.778 m)  Wt 159 lb 6.4 oz (72.303 kg)  BMI 22.87 kg/m2  SpO2 97%  Body mass index is 22.87 kg/(m^2).   No ankle edema present  Assesment/PLAN:  1. Diabetes type 1, uncontrolled   The patient's diabetes control appears to be about the same as before with usual fluctuations See history of present illness for detailed discussion of current blood sugar patterns and problems identified  Recommendations made today:  Avoid snacking during the night, if he is afraid of eating low sugars he may drink 4 ounces of juice or similar drink  If blood sugars are getting higher at suppertime again Will increase  morning Lantus by 2 units  Continue to be more active which appears to be helping his control  Extra 1-2 units more for eating out and with higher fat meals  Consistent administration of NovoLog right before eating  No change in evening Lantus insulin   2.  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  Usually has better sodium levels when he does restrict his fluids.  He been asymptomatic even with levels below 130 and will continue to follow, urine osmolality to be checked again  3. Hypertension is well controlled with ramipril alone and he is doing some monitoring on his own  4. Neuropathy: Symptoms are relatively mild, has only mild sensory loss on the left side  5. Smoking cessation: He says he is cutting back on his smoking and using some vapor inhalation now, is down to half a pack from one pack a day previously  Counseling time over 50% of today's 25 minute visit   Marcedes Tech 10/29/2013, 11:56 AM    Addendum: Sodium 129, stable, A1c 7.9  Office Visit on 10/28/2013  Component Date Value Ref Range Status  . Hemoglobin A1C 10/28/2013 7.9* 4.6 - 6.5 % Final   Glycemic Control Guidelines for People with Diabetes:Non Diabetic:  <6%Goal of Therapy: <7%Additional Action Suggested:  >8%   . Sodium 10/28/2013 129* 135 - 145 mEq/L Final  . Potassium 10/28/2013 4.7  3.5 - 5.1 mEq/L Final  . Chloride 10/28/2013 93* 96 - 112 mEq/L Final  . CO2 10/28/2013 32  19 - 32 mEq/L Final  . Glucose, Bld 10/28/2013 113* 70 - 99 mg/dL Final  . BUN 16/10/960403/23/2015 6  6 - 23 mg/dL Final  . Creatinine, Ser 10/28/2013 0.8  0.4 - 1.5 mg/dL Final  . Calcium 54/09/811903/23/2015 8.8  8.4 - 10.5 mg/dL Final  . GFR 14/78/295603/23/2015 108.55  >60.00 mL/min Final  . Cholesterol 10/28/2013 127  0 - 200 mg/dL Final   ATP III Classification       Desirable:  < 200 mg/dL               Borderline High:  200 - 239 mg/dL          High:  > = 213240 mg/dL  .  Triglycerides 10/28/2013 28.0  0.0 - 149.0 mg/dL Final   Normal:  <086<150 mg/dLBorderline High:  150 - 199 mg/dL  . HDL 10/28/2013 46.30  >39.00 mg/dL Final  . VLDL 57/84/696203/23/2015 5.6  0.0 - 95.240.0 mg/dL Final  . LDL Cholesterol 10/28/2013 75  0 - 99 mg/dL Final  . Total CHOL/HDL Ratio 10/28/2013 3   Final                  Men          Women1/2 Average Risk     3.4          3.3Average Risk          5.0          4.42X Average Risk          9.6          7.13X Average Risk          15.0          11.0                      . Microalb, Ur 10/28/2013 0.7  0.0 - 1.9 mg/dL Final  . Creatinine,U 84/13/244003/23/2015 59.6   Final  . Microalb Creat Ratio 10/28/2013 1.2  0.0 - 30.0 mg/g Final  . Color, Urine 10/28/2013 YELLOW  Yellow;Lt. Yellow Final  . APPearance 10/28/2013 CLEAR  Clear Final  . Specific Gravity, Urine 10/28/2013 1.010  1.000-1.030 Final  . pH 10/28/2013 6.0  5.0 - 8.0 Final  . Total Protein, Urine 10/28/2013 NEGATIVE  Negative Final  . Urine Glucose 10/28/2013 NEGATIVE  Negative Final  . Ketones, ur 10/28/2013 NEGATIVE  Negative Final  . Bilirubin Urine 10/28/2013 NEGATIVE  Negative Final  . Hgb urine dipstick  10/28/2013 NEGATIVE  Negative Final  . Urobilinogen, UA 10/28/2013 0.2  0.0 - 1.0 Final  . Leukocytes, UA 10/28/2013 NEGATIVE  Negative Final  . Nitrite 10/28/2013 NEGATIVE  Negative Final  . WBC, UA 10/28/2013 0-2/hpf  0-2/hpf Final  . RBC / HPF 10/28/2013 0-2/hpf  0-2/hpf Final  . Squamous Epithelial / LPF 10/28/2013 Rare(0-4/hpf)  Rare(0-4/hpf) Final

## 2013-10-30 LAB — OSMOLALITY, URINE: OSMOLALITY UR: 245 mosm/kg — AB (ref 390–1090)

## 2013-11-11 ENCOUNTER — Telehealth: Payer: Self-pay | Admitting: *Deleted

## 2013-11-11 NOTE — Telephone Encounter (Signed)
Results given to patient

## 2013-11-11 NOTE — Telephone Encounter (Signed)
Patient is requesting lab results.

## 2013-11-11 NOTE — Telephone Encounter (Signed)
Sodium 129, still slightly low, you may give him A1c report

## 2014-01-27 ENCOUNTER — Ambulatory Visit (INDEPENDENT_AMBULATORY_CARE_PROVIDER_SITE_OTHER): Payer: Medicare Other | Admitting: Endocrinology

## 2014-01-27 ENCOUNTER — Other Ambulatory Visit: Payer: Self-pay | Admitting: *Deleted

## 2014-01-27 ENCOUNTER — Encounter: Payer: Self-pay | Admitting: Endocrinology

## 2014-01-27 VITALS — BP 142/74 | HR 60 | Temp 98.3°F | Resp 14 | Ht 70.0 in | Wt 149.8 lb

## 2014-01-27 DIAGNOSIS — E871 Hypo-osmolality and hyponatremia: Secondary | ICD-10-CM

## 2014-01-27 DIAGNOSIS — R634 Abnormal weight loss: Secondary | ICD-10-CM | POA: Diagnosis not present

## 2014-01-27 DIAGNOSIS — I10 Essential (primary) hypertension: Secondary | ICD-10-CM | POA: Diagnosis not present

## 2014-01-27 DIAGNOSIS — E1049 Type 1 diabetes mellitus with other diabetic neurological complication: Secondary | ICD-10-CM

## 2014-01-27 DIAGNOSIS — E1065 Type 1 diabetes mellitus with hyperglycemia: Principal | ICD-10-CM

## 2014-01-27 LAB — COMPREHENSIVE METABOLIC PANEL
ALBUMIN: 4 g/dL (ref 3.5–5.2)
ALK PHOS: 51 U/L (ref 39–117)
ALT: 9 U/L (ref 0–53)
AST: 18 U/L (ref 0–37)
BUN: 8 mg/dL (ref 6–23)
CO2: 32 mEq/L (ref 19–32)
Calcium: 8.8 mg/dL (ref 8.4–10.5)
Chloride: 89 mEq/L — ABNORMAL LOW (ref 96–112)
Creatinine, Ser: 0.8 mg/dL (ref 0.4–1.5)
GFR: 105.37 mL/min (ref >60.00)
Glucose, Bld: 194 mg/dL — ABNORMAL HIGH (ref 70–99)
POTASSIUM: 4.8 meq/L (ref 3.5–5.1)
SODIUM: 125 meq/L — AB (ref 135–145)
TOTAL PROTEIN: 6.6 g/dL (ref 6.0–8.3)
Total Bilirubin: 0.3 mg/dL (ref 0.2–1.2)

## 2014-01-27 LAB — TSH: TSH: 0.34 u[IU]/mL — ABNORMAL LOW (ref 0.35–4.50)

## 2014-01-27 LAB — T4, FREE: Free T4: 1.24 ng/dL (ref 0.60–1.60)

## 2014-01-27 LAB — HEMOGLOBIN A1C: Hgb A1c MFr Bld: 7.8 % — ABNORMAL HIGH (ref 4.6–6.5)

## 2014-01-27 LAB — CORTISOL: Cortisol, Plasma: 6.2 ug/dL

## 2014-01-27 MED ORDER — INSULIN LISPRO 100 UNIT/ML (KWIKPEN): PEN_INJECTOR | SUBCUTANEOUS | Status: DC

## 2014-01-27 NOTE — Patient Instructions (Signed)
INCREASE Lantus to 26 in pms  Ask if [facility]o insulin covered by insurance

## 2014-01-27 NOTE — Progress Notes (Signed)
Patient ID: Peter Mendez, male   DOB: 09/10/1960, 53 y.o.   MRN: [id number]   Reason for Appointment: Diabetes follow-up   History of Present Illness   Diagnosis: Type 1 diabetes mellitus, diagnosis 1991.  He has been on basal bolus insulin regimen for a few years His A1c previously was consistently around 7-7.5 He tends to have significant fluctuation in blood sugars at all times. He has difficulty keeping his blood sugars consistent with a meal coverage and estimating the amount of insulin needed for various kinds of foods since he does not count carbohydrates A1c has been higher since 2014 The insulin regimen is described as premeal rapid analog 6-8 usually, Lantus 36 in a.m. and 24 p.m..  His blood sugars are again quite labile and overall poorly controlled Current problems and blood sugar patterns  Significant variation in blood sugars at all times.   Blood sugars are overall significantly high overnight and early morning although some readings overnight are fairly good also. Overall highest blood sugars are between 65M-9 AM with median 211  Blood sugars are variable midday and afternoon but overall still high  Blood sugars are generally lower before and after supper  He will sometimes take correction doses of insulin during the night or early morning and occasionally this causes hypoglycemia  Does not try to adjust his Lantus insulin even when fasting readings are high  Blood sugars are recently higher at lunchtime also  He says he ran out of his Humalog couple of days ago and this may be causing some of his high readings. [name] Usually compliant with taking the NovoLog with his meal and trying to adjust it based on meal size  [name] still trying to monitor about 6-7 times a day to help with control Blood Glucose readings:  PREMEAL Breakfast Lunch Dinner  PCS  Overall  Glucose range:  44-247   103-292   120-238   98-254     Mean/median:  211   188   145   155  187   Hypoglycemia frequency: Infrequent recently, having occasional low sugars midmorning and once after lunch  Hypoglycemic awareness: Usually develops symptoms when blood glucose is less than 50, usually sweating.   Meals: Inconsistent schedule, Inconsistent quantity. Breakfast is at 6-8 am Supper 6-7 pm  Cereal in ams; 1/2 sandwich hs;  no night snack Carbohydrate intake: at snacks: some during the night with milk. Eating more chicken and [place], less meat  Physical activity: exercise: walking some.  Dietician visit: Most recent:, 4/09.   Wt Readings from Last 3 Encounters:  01/27/14 149 lb 12.8 oz (67.949 kg)  10/28/13 159 lb 6.4 oz (72.303 kg)  07/29/13 158 lb 1.6 oz (71.714 kg)    Lab Results  Component Value Date   HGBA1C 7.8* 01/27/2014   HGBA1C 7.9* 10/28/2013   HGBA1C 7.8* 07/29/2013   Lab Results  Component Value Date   MICROALBUR 0.7 10/28/2013   LDLCALC 75 10/28/2013   CREATININE 0.8 01/27/2014    Complications: retinopathy, peripheral neuropathy.  Diabetic nephropathy: None, Last microalbumin normal.    HYPONATREMIA: Long-standing and it appears idiopathic. Appears sodium was consistently below 130 previously it is better more recently without any specific intervention. 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 Cortrosyn stimulation test was also normal Fluid intake has been better restricted and he is not  drinking Gatorade as before. May drink milk at night. Urinary sodium previously was 138 and U Osm 245 Drinks 4 bottles of water with other fluids   Demeclocycline was prescribed but is not covered by insurance.      Medication List       This list is accurate as of: 01/27/14 11:59 PM.  Always use your most recent med list.               alprazolam 2 MG tablet  Commonly known as:  XANAX  2 mg.     glucose blood test strip  Use to  check blood sugars 7 times per day dx code 250.03     Insulin Glargine 100 UNIT/ML Solostar Pen  Commonly known as:  LANTUS  Inject 34 Units into the skin 2 (two) times daily. 36 units in am and 34 units in pm     insulin lispro 100 UNIT/ML KiwkPen  Commonly known as:  HUMALOG  6-8 units 4 times per day     onetouch ultrasoft lancets  Use as instructed to check blood sugars 7 times per day     oxycodone 30 MG immediate release tablet  Commonly known as:  ROXICODONE     PEN NEEDLES 31GX5/16" 31G X 8 MM Misc  Use to check blood sugar 7 times per day     pravastatin 40 MG tablet  Commonly known as:  PRAVACHOL     ramipril 10 MG capsule  Commonly known as:  ALTACE        Allergies: No Known Allergies  Past Medical History  Diagnosis Date  . Low back pain     History reviewed. No pertinent past surgical history.  Family History  Problem Relation Age of Onset  . Diabetes Mother   . Diabetes Maternal Uncle   . Diabetes Maternal Grandmother   . Cancer Maternal Grandfather   . Diabetes Maternal Grandfather     Social History:  reports that he has been smoking Cigarettes.  He has been smoking about 0.50 packs per day. He does not have any smokeless tobacco history on file. His alcohol and drug histories are not on file.  Review of Systems   Weight loss: This is from stress, family illness  Chronic back pain taking narcotic medications Visual loss from retinopathy He has had symptoms of neuropathy, diabetic foot exam on 07/29/13  HYPERTENSION: Well controlled, he also monitors periodically at drug store . Current regimen is  ramipril 10 mg only. Home blood pressure checks about 128-138 /70 Diabetic foot exam done in 12/14  Smoking cessation: [name] cutting back on his smoking and using some vapor inhalation now, is down to half a pack from one pack a day previously   Examination:   BP 142/74  Pulse 60  Temp(Src) 98.3 F (36.8 C)  Resp 14  Ht 5\' 10"  (1.778 m)   Wt 149 lb 12.8 oz (67.949 kg)  BMI 21.49 kg/m2  SpO2 96%  Body mass index is 21.49 kg/(m^2).   No ankle edema present  Assesment/PLAN:  1. Diabetes type 1, uncontrolled    His blood sugars are overall appearing higher especially overnight See history of present illness for detailed discussion of current blood sugar patterns and problems identified [name] not able to adjust Lantus on his own even though he is having consistently high readings in the mornings Does not also adjust his mealtime dose adequately and mostly taking 6 units Not checking enough correction doses for  high readings  Recommendations made today:  Increase evening Lantus by at least 2 units, discussed needing to increase it further fasting readings are still over at least 150  Take 1 unit correction for every 50 mg over 150  Adjust the mealtime dose based on size of meal and Carbohydrates  Will change his prescription for Humalog so he can get enough supplies to last a whole month    2.  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  Usually has better sodium levels when he does restrict his fluids. Urine osmolality is nearly the same as serum osmolality  He been asymptomatic even with levels below 130 and will  repeat his level today Discussed needing to cut back on his fluid intake as he is drinking excessive amounts of water  3. Hypertension is well controlled with ramipril alone and he is doing some monitoring on his own  4. Neuropathy: Symptoms are relatively mild, has only mild sensory loss on the left side  Counseling time over 50% of today's 25 minute visit   KUMAR,AJAY 01/28/2014, 3:02 PM    Addendum: Sodium 125, stable, A1c. Needs better fluid restriction  Office Visit on 01/27/2014  Component Date Value Ref Range Status  . Hemoglobin A1C 01/27/2014 7.8* 4.6 - 6.5 % Final   Glycemic Control Guidelines for People with  Diabetes:Non Diabetic:  <6%Goal of Therapy: <7%Additional Action Suggested:  >8%   . Sodium 01/27/2014 125* 135 - 145 mEq/L Final  . Potassium 01/27/2014 4.8  3.5 - 5.1 mEq/L Final  . Chloride 01/27/2014 89* 96 - 112 mEq/L Final  . CO2 01/27/2014 32  19 - 32 mEq/L Final  . Glucose, Bld 01/27/2014 194* 70 - 99 mg/dL Final  . BUN 16/10/960406/22/2015 8  6 - 23 mg/dL Final  . Creatinine, Ser 01/27/2014 0.8  0.4 - 1.5 mg/dL Final  . Total Bilirubin 01/27/2014 0.3  0.2 - 1.2 mg/dL Final  . Alkaline Phosphatase 01/27/2014 51  39 - 117 U/L Final  . AST 01/27/2014 18  0 - 37 U/L Final  . ALT 01/27/2014 9  0 - 53 U/L Final  . Total Protein 01/27/2014 6.6  6.0 - 8.3 g/dL Final  . Albumin 54/09/811906/22/2015 4.0  3.5 - 5.2 g/dL Final  . Calcium 14/78/295606/22/2015 8.8  8.4 - 10.5 mg/dL Final  . GFR 21/30/865706/22/2015 105.37  >60.00 mL/min Final  . Free T4 01/27/2014 1.24  0.60 - 1.60 ng/dL Final  . TSH 84/69/629506/22/2015 0.34* 0.35 - 4.50 uIU/mL Final  . Cortisol, Plasma 01/27/2014 6.2   Final   AM:  4.3 - 22.4 ug/dLPM:  3.1 - 16.7 ug/dL

## 2014-01-28 NOTE — Progress Notes (Signed)
Quick Note:  Sodium is down to 125, needs to restrict water as directed. Needs followup BMP in 3 weeks, A1c 7.8 ______

## 2014-02-18 ENCOUNTER — Other Ambulatory Visit (INDEPENDENT_AMBULATORY_CARE_PROVIDER_SITE_OTHER): Payer: Medicare Other

## 2014-02-18 DIAGNOSIS — E871 Hypo-osmolality and hyponatremia: Secondary | ICD-10-CM

## 2014-02-18 LAB — BASIC METABOLIC PANEL
BUN: 10 mg/dL (ref 6–23)
CO2: 31 meq/L (ref 19–32)
CREATININE: 0.9 mg/dL (ref 0.4–1.5)
Calcium: 9.1 mg/dL (ref 8.4–10.5)
Chloride: 98 mEq/L (ref 96–112)
GFR: 93.29 mL/min (ref >60.00)
Glucose, Bld: 124 mg/dL — ABNORMAL HIGH (ref 70–99)
Potassium: 4.4 mEq/L (ref 3.5–5.1)
SODIUM: 134 meq/L — AB (ref 135–145)

## 2014-02-19 NOTE — Progress Notes (Signed)
Quick Note:  Please let patient know that the sodium result is much better and continue fluid restriction as discussed ______

## 2014-03-10 ENCOUNTER — Other Ambulatory Visit: Payer: Self-pay | Admitting: *Deleted

## 2014-03-10 MED ORDER — INSULIN GLARGINE 100 UNIT/ML SOLOSTAR PEN: PEN_INJECTOR | SUBCUTANEOUS | Status: DC

## 2014-04-28 ENCOUNTER — Other Ambulatory Visit: Payer: Self-pay | Admitting: *Deleted

## 2014-04-28 ENCOUNTER — Telehealth: Payer: Self-pay | Admitting: Endocrinology

## 2014-04-28 MED ORDER — INSULIN LISPRO 100 UNIT/ML (KWIKPEN)
PEN_INJECTOR | SUBCUTANEOUS | Status: DC
Start: 2014-04-28 — End: 2015-05-27

## 2014-04-28 MED ORDER — "PEN NEEDLES 5/16"" 31G X 8 MM MISC": Status: DC

## 2014-04-28 NOTE — Telephone Encounter (Signed)
Patient would like a prescription refill of humalog and pin tips 31 gage, he called pharmacy they told him he didn't have any more refills

## 2014-04-28 NOTE — Telephone Encounter (Signed)
rx sent

## 2014-04-29 ENCOUNTER — Encounter: Payer: Self-pay | Admitting: Endocrinology

## 2014-04-29 ENCOUNTER — Other Ambulatory Visit: Payer: Self-pay | Admitting: *Deleted

## 2014-04-29 ENCOUNTER — Other Ambulatory Visit (INDEPENDENT_AMBULATORY_CARE_PROVIDER_SITE_OTHER): Payer: Medicare Other

## 2014-04-29 ENCOUNTER — Ambulatory Visit (INDEPENDENT_AMBULATORY_CARE_PROVIDER_SITE_OTHER): Payer: Medicare Other | Admitting: Endocrinology

## 2014-04-29 VITALS — BP 142/72 | HR 55 | Temp 97.9°F | Resp 16 | Ht 70.0 in | Wt 149.6 lb

## 2014-04-29 DIAGNOSIS — E1065 Type 1 diabetes mellitus with hyperglycemia: Principal | ICD-10-CM

## 2014-04-29 DIAGNOSIS — I10 Essential (primary) hypertension: Secondary | ICD-10-CM

## 2014-04-29 DIAGNOSIS — E1049 Type 1 diabetes mellitus with other diabetic neurological complication: Secondary | ICD-10-CM

## 2014-04-29 DIAGNOSIS — E871 Hypo-osmolality and hyponatremia: Secondary | ICD-10-CM

## 2014-04-29 LAB — LIPID PANEL
CHOLESTEROL: 143 mg/dL (ref 0–200)
HDL: 45.3 mg/dL (ref >39.00)
LDL Cholesterol: 87 mg/dL (ref 0–99)
NonHDL: 97.7
Total CHOL/HDL Ratio: 3
Triglycerides: 52 mg/dL (ref 0.0–149.0)
VLDL: 10.4 mg/dL (ref 0.0–40.0)

## 2014-04-29 LAB — COMPREHENSIVE METABOLIC PANEL
ALBUMIN: 3.8 g/dL (ref 3.5–5.2)
ALT: 9 U/L (ref 0–53)
AST: 18 U/L (ref 0–37)
Alkaline Phosphatase: 56 U/L (ref 39–117)
BUN: 6 mg/dL (ref 6–23)
CO2: 33 mEq/L — ABNORMAL HIGH (ref 19–32)
Calcium: 8.7 mg/dL (ref 8.4–10.5)
Chloride: 93 mEq/L — ABNORMAL LOW (ref 96–112)
Creatinine, Ser: 0.9 mg/dL (ref 0.4–1.5)
GFR: 94.43 mL/min (ref >60.00)
GLUCOSE: 114 mg/dL — AB (ref 70–99)
POTASSIUM: 4.3 meq/L (ref 3.5–5.1)
Sodium: 127 mEq/L — ABNORMAL LOW (ref 135–145)
TOTAL PROTEIN: 6.8 g/dL (ref 6.0–8.3)
Total Bilirubin: 0.4 mg/dL (ref 0.2–1.2)

## 2014-04-29 LAB — HEMOGLOBIN A1C: Hgb A1c MFr Bld: 7.8 % — ABNORMAL HIGH (ref 4.6–6.5)

## 2014-04-29 NOTE — Progress Notes (Signed)
Patient ID: Peter Mendez, male   DOB: 02-26-1961, 53 y.o.   MRN: [id number]   Reason for Appointment: Diabetes follow-up   History of Present Illness   Diagnosis: Type 1 diabetes mellitus, diagnosis 1991.  He has been on basal bolus insulin regimen for a few years His A1c previously was consistently around 7-7.5 He tends to have significant fluctuation in blood sugars at all times. He has difficulty keeping his postprandial blood sugars consistent with mealtime insulin coverage and estimating the amount of insulin needed for various kinds of foods since he does not count carbohydrates A1c has been higher since 2014  The insulin regimen is described as premeal rapid analog 6-8 usually, Lantus 36 in a.m. and 24 p.m..  His blood sugars are  variable but on an average are not as high Also not having significant hypoglycemia except for a couple of occasions Current problems and blood sugar patterns  Variability in blood sugars at all times, most significantly overnight when he is checking his blood sugars at least once or twice.   Blood sugars are overall significantly high after lunch with a median of 183 but this is not consistent especially recently Has tendency to rising blood sugars late in the evening but less recently  Usually compliant with taking the Humalog with his meal and trying to adjust it based on meal size. He thinks he is taking 1-2 units more compared to before and this may be helping recently with postprandial control  [name] still trying to monitor about 6-7 times a day to help with control Blood Glucose readings:  PREMEAL Breakfast Lunch Dinner  PCS  Overall  Glucose range:  65-300   39-239   74-168   74-257    Mean/median:  154   138   166  158   Hypoglycemia frequency: Infrequent recently, having occasional low sugars midmorning and once before lunch. Recently had significant low blood sugar with trying to correct the high reading in the morning  Hypoglycemic  awareness: Usually develops symptoms when blood glucose is less than 50, usually sweating.   Meals: Inconsistent schedule, Inconsistent quantity. Breakfast is at 6-8 am Supper 6-7 pm  Cereal in ams; 1/2 sandwich hs;  no night snack Carbohydrate intake: at snacks: some during the night with milk. Eating more chicken and [place], less meat  Physical activity: exercise: minimal Dietician visit: Most recent:, 4/09.   Wt Readings from Last 3 Encounters:  04/29/14 149 lb 9.6 oz (67.858 kg)  01/27/14 149 lb 12.8 oz (67.949 kg)  10/28/13 159 lb 6.4 oz (72.303 kg)    Lab Results  Component Value Date   HGBA1C 7.8* 04/29/2014   HGBA1C 7.8* 01/27/2014   HGBA1C 7.9* 10/28/2013   Lab Results  Component Value Date   MICROALBUR 0.7 10/28/2013   LDLCALC 87 04/29/2014   CREATININE 0.9 04/29/2014    Complications: retinopathy, peripheral neuropathy.  Diabetic nephropathy: None, Last microalbumin normal.    HYPONATREMIA: Long-standing and it appears idiopathic. Appears sodium was consistently below 130 previously it is better more recently without any specific intervention. 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  Cortrosyn stimulation test was also normal   Urinary sodium previously was 138 and U Osm 245  Fluid intake has been better restricted overall and he is not drinking Gatorade as before.  On his last visit sodium was 124 and he demonstrated drinking extra water and  Gatorade and sodium came back to 134   Demeclocycline was prescribed previously but  not covered by insurance.      Medication List       This list is accurate as of: 04/29/14  9:18 PM.  Always use your most recent med list.               alprazolam 2 MG tablet  Commonly known as:  XANAX  2 mg.     glucose blood test strip  Use to check blood sugars 7 times per day dx code 250.03     Insulin Glargine 100 UNIT/ML Solostar Pen   Commonly known as:  LANTUS  Inject 36 units in am and 34 units in pm     insulin lispro 100 UNIT/ML KiwkPen  Commonly known as:  HUMALOG  6-8 units 4 times per day     onetouch ultrasoft lancets  Use as instructed to check blood sugars 7 times per day     oxycodone 30 MG immediate release tablet  Commonly known as:  ROXICODONE     PEN NEEDLES 31GX5/16" 31G X 8 MM Misc  Use to check blood sugar 7 times per day     pravastatin 40 MG tablet  Commonly known as:  PRAVACHOL     ramipril 10 MG capsule  Commonly known as:  ALTACE        Allergies: No Known Allergies  Past Medical History  Diagnosis Date  . Low back pain     No past surgical history on file.  Family History  Problem Relation Age of Onset  . Diabetes Mother   . Diabetes Maternal Uncle   . Diabetes Maternal Grandmother   . Cancer Maternal Grandfather   . Diabetes Maternal Grandfather     Social History:  reports that he has been smoking Cigarettes.  He has been smoking about 0.50 packs per day. He does not have any smokeless tobacco history on file. His alcohol and drug histories are not on file.  Review of Systems   Weight loss:  Has leveled off but still has significant stress   Chronic back pain taking narcotic medications Visual loss from retinopathy, to see his eye doctor today   He has had symptoms of neuropathy, diabetic foot exam on 07/29/13  HYPERTENSION: Well controlled, he also monitors periodically at drug store. Current regimen is  ramipril 10 mg only. Home blood pressure checks as low as 113 and otherwise around 128  Smoking cessation: [name] cutting back on his smoking and using a vapor inhalation now, is down to half a pack or less   Examination:   BP 142/72  Pulse 55  Temp(Src) 97.9 F (36.6 C)  Resp 16  Ht  (1.778 m)  Wt 149 lb 9.6 oz (67.858 kg)  BMI 21.47 kg/m2  SpO2 97%  Body mass index is 21.47 kg/(m^2).      Assesment/PLAN:  1. Diabetes type 1, uncontrolled     His blood sugars are overall appearing better compared to the last visit but still sporadically getting higher overnight, afternoon and after dinner Most likely with trying to increase his mealtime coverage his postprandial readings are somewhat better Also may be watching his diet better He has not changed his Lantus and recently sugars before breakfast and supper are fairly good  See history of present illness for detailed discussion of current blood sugar patterns and problems identified [name] not able to adjust Lantus on his own even  Has not had significant hypoglycemia except once or twice possibly from over correcting high readings or from inadequate protein in the morning    2.  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  Usually has better sodium levels when he does restrict his fluids and his last level was improved at 134. Urine osmolality is nearly the same as serum osmolality  He been asymptomatic even with levels below 130 and will  repeat his level today Discussed needing to  consistently reduce his fluid intake except as when he is thirsty   3. Hypertension is well controlled with ramipril alone and he doesn't monitor periodically also   4. smoking cessation: [name] trying to do better   Counseling time over 50% of today's 25 minute visit   Derriana Oser 04/29/2014, 9:18 PM    Addendum:  A1c about the same, sodium 127 and needs to further restrict water intake  Appointment on 04/29/2014  Component Date Value Ref Range Status  . Hemoglobin A1C 04/29/2014 7.8* 4.6 - 6.5 % Final   Glycemic Control Guidelines for People with Diabetes:Non Diabetic:  <6%Goal of Therapy: <7%Additional Action Suggested:  >8%   . Sodium 04/29/2014 127* 135 - 145 mEq/L Final  . Potassium 04/29/2014 4.3  3.5 - 5.1 mEq/L Final  . Chloride 04/29/2014 93* 96 - 112 mEq/L Final  . CO2 04/29/2014 33* 19 - 32 mEq/L Final  .  Glucose, Bld 04/29/2014 114* 70 - 99 mg/dL Final  . BUN 40/98/1191 6  6 - 23 mg/dL Final  . Creatinine, Ser 04/29/2014 0.9  0.4 - 1.5 mg/dL Final  . Total Bilirubin 04/29/2014 0.4  0.2 - 1.2 mg/dL Final  . Alkaline Phosphatase 04/29/2014 56  39 - 117 U/L Final  . AST 04/29/2014 18  0 - 37 U/L Final  . ALT 04/29/2014 9  0 - 53 U/L Final  . Total Protein 04/29/2014 6.8  6.0 - 8.3 g/dL Final  . Albumin 47/82/9562 3.8  3.5 - 5.2 g/dL Final  . Calcium 13/03/6577 8.7  8.4 - 10.5 mg/dL Final  . GFR 46/96/2952 94.43  >60.00 mL/min Final  . Cholesterol 04/29/2014 143  0 - 200 mg/dL Final   ATP III Classification       Desirable:  < 200 mg/dL               Borderline High:  200 - 239 mg/dL          High:  > = 841 mg/dL  . Triglycerides 04/29/2014 52.0  0.0 - 149.0 mg/dL Final   Normal:  <324 mg/dLBorderline High:  150 - 199 mg/dL  . HDL 04/29/2014 45.30  >39.00 mg/dL Final  . VLDL 40/05/2724 10.4  0.0 - 40.0 mg/dL Final  . LDL Cholesterol 04/29/2014 87  0 - 99 mg/dL Final  . Total CHOL/HDL Ratio 04/29/2014 3   Final                  Men          Women1/2 Average Risk     3.4          3.3Average Risk          5.0          4.42X Average Risk          9.6          7.13X Average Risk  15.0          11.0                      . NonHDL 04/29/2014 97.70   Final   NOTE:  Non-HDL goal should be 30 mg/dL higher than patient's LDL goal (i.e. LDL goal of < 70 mg/dL, would have non-HDL goal of < 100 mg/dL)

## 2014-04-29 NOTE — Progress Notes (Signed)
Quick Note:  A1c about the same at 7.8, sodium again lower at 127 and needs to further restrict water intake ______

## 2014-04-29 NOTE — Patient Instructions (Signed)
Take 2-4 units Humalog for snacks in the night

## 2014-05-08 ENCOUNTER — Encounter: Payer: Self-pay | Admitting: Vascular Surgery

## 2014-05-09 ENCOUNTER — Encounter: Payer: Self-pay | Admitting: Cardiovascular Disease

## 2014-05-09 ENCOUNTER — Encounter: Payer: Medicare Other | Admitting: Vascular Surgery

## 2014-05-09 ENCOUNTER — Ambulatory Visit (INDEPENDENT_AMBULATORY_CARE_PROVIDER_SITE_OTHER): Payer: Medicare Other | Admitting: Cardiovascular Disease

## 2014-05-09 VITALS — BP 162/94 | HR 60 | Ht 71.0 in | Wt 151.0 lb

## 2014-05-09 DIAGNOSIS — I739 Peripheral vascular disease, unspecified: Secondary | ICD-10-CM | POA: Insufficient documentation

## 2014-05-09 DIAGNOSIS — I10 Essential (primary) hypertension: Secondary | ICD-10-CM

## 2014-05-09 NOTE — Assessment & Plan Note (Signed)
Patient was referred for evaluation of peripheral arterial disease. 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 There are no open wounds. Based on this, I do not feel compelled to pursue this interventionally at this time I have told him that should his symptoms become lifestyle limiting I would be happy to angiogram him and the fine his anatomy and potentially perform endovascular therapy.

## 2014-05-09 NOTE — Progress Notes (Signed)
05/09/2014 Peter Mendez.   [birth year]  [id number]  Primary Physician Peter Mendez, Peter Millin, Peter Mendez Primary Cardiologist: Peter Gess MD Peter Mendez   HPI:  Peter Mendez is a 53 year old thin appearing single Caucasian male with no children referred by [facility] urgent care for evaluation of peripheral vascular disease. His cardiovascular risk factor profile is remarkable for 40-pack-years of tobacco abuse currently smoking one pack per day, treated hypertension, type 1 diabetes as well as a family history of heart disease with a father who had a myocardial infarction at age 20. He has never had a heart attack or stroke. He denies chest pain or shortness of breath. He has been a type I diabetic for 25 or 30 years. He does have peripheral neuropathy as well as retinopathy. 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   Current Outpatient Prescriptions  Medication Sig Dispense Refill  . alprazolam (XANAX) 2 MG tablet 2 mg.      Marland Kitchen glucose blood test strip Use to check blood sugars 7 times per day dx code 250.03  200 each  5  . Insulin Glargine (LANTUS) 100 UNIT/ML Solostar Pen Inject 36 units in am and 34 units in pm  30 mL  3  . insulin lispro (HUMALOG) 100 UNIT/ML KiwkPen 6-8 units 4 times per day  15 mL  5  . Insulin Pen Needle (PEN NEEDLES 31GX5/16") 31G X 8 MM MISC Use to check blood sugar 7 times per day  250 each  5  . Lancets (ONETOUCH ULTRASOFT) lancets Use as instructed to check blood sugars 7 times per day  210 each  3  . oxycodone (ROXICODONE) 30 MG immediate release tablet       . ramipril (ALTACE) 10 MG capsule Take 10 mg by mouth as needed (take as needed for htn).        No current facility-administered medications for this visit.    Allergies  Allergen Reactions  . Pravastatin Sodium     myalgia    History   Social History  .  Marital Status: Single    Spouse Name: N/A    Number of Children: N/A  . Years of Education: N/A   Occupational History  . Not on file.   Social History Main Topics  . Smoking status: Current Every Day Smoker -- 0.50 packs/day    Types: Cigarettes  . Smokeless tobacco: Not on file  . Alcohol Use: Not on file  . Drug Use: Not on file  . Sexual Activity: Not on file   Other Topics Concern  . Not on file   Social History Narrative  . No narrative on file     Review of Systems: General: negative for chills, fever, night sweats or weight changes.  Cardiovascular: negative for chest pain, dyspnea on exertion, edema, orthopnea, palpitations, paroxysmal nocturnal dyspnea or shortness of breath Dermatological: negative for rash Respiratory: negative for cough or wheezing Urologic: negative for hematuria Abdominal: negative for nausea, vomiting, diarrhea, bright red blood per rectum, melena, or hematemesis Neurologic: negative for visual changes, syncope, or dizziness All other systems reviewed and are otherwise negative except as noted above.    Blood pressure 162/94, pulse 60, height 5\' 11"  (1.803 m), weight 151 lb (68.493 kg).  General appearance: alert and no distress Neck: no adenopathy, no carotid bruit, no JVD, supple, symmetrical, trachea midline and  thyroid not enlarged, symmetric, no tenderness/mass/nodules Lungs: clear to auscultation bilaterally Heart: regular rate and rhythm, S1, S2 normal, no murmur, click, rub or gallop Extremities: extremities normal, atraumatic, no cyanosis or edema and left common femoral pulse, 2+ right common femoral pulse with a soft bruit, absent pedal pulses  EKG not performed today  ASSESSMENT AND PLAN:   Essential hypertension Controlled on current medications  Claudication Patient was referred for evaluation of peripheral arterial disease. [facility] revealing what appeared to be left common  iliac artery occlusion.he does have diabetic peripheral neuropathy but denies lifestyle limiting claudication. There are no open wounds. Based on this, I do not feel compelled to pursue this interventionally at this time I have told him that should his symptoms become lifestyle limiting I would be happy to angiogram him and the fine his anatomy and potentially perform endovascular therapy.      Peter GessJonathan J. Ashaya Raftery MD FACP,FACC,FAHA, [facility] 05/09/2014 12:11 PM

## 2014-05-09 NOTE — Assessment & Plan Note (Signed)
Controlled on current medications 

## 2014-05-09 NOTE — Patient Instructions (Signed)
Follow up with Dr Berry as needed.  

## 2014-05-21 ENCOUNTER — Encounter: Payer: Medicare Other | Admitting: Surgery

## 2014-05-26 ENCOUNTER — Ambulatory Visit: Payer: Medicare Other | Admitting: Cardiology

## 2014-05-28 ENCOUNTER — Encounter: Payer: Medicare Other | Admitting: Vascular Surgery

## 2014-06-10 ENCOUNTER — Ambulatory Visit: Payer: Medicare Other | Admitting: Cardiovascular Disease

## 2014-07-09 ENCOUNTER — Other Ambulatory Visit: Payer: Self-pay | Admitting: *Deleted

## 2014-07-09 ENCOUNTER — Telehealth: Payer: Self-pay | Admitting: Endocrinology

## 2014-07-09 MED ORDER — GLUCOSE BLOOD VI STRP: ORAL_STRIP | Status: DC

## 2014-07-09 NOTE — Telephone Encounter (Signed)
[facility] [place] [facility] and never got a response for patient request for the One touch diabetic test strips.  Please advise

## 2014-07-29 ENCOUNTER — Ambulatory Visit (INDEPENDENT_AMBULATORY_CARE_PROVIDER_SITE_OTHER): Payer: Medicare Other | Admitting: Endocrinology

## 2014-07-29 ENCOUNTER — Encounter: Payer: Self-pay | Admitting: Endocrinology

## 2014-07-29 VITALS — BP 139/59 | HR 63 | Temp 98.5°F | Resp 14 | Ht 71.0 in | Wt 151.6 lb

## 2014-07-29 DIAGNOSIS — E871 Hypo-osmolality and hyponatremia: Secondary | ICD-10-CM

## 2014-07-29 DIAGNOSIS — IMO0002 Reserved for concepts with insufficient information to code with codable children: Secondary | ICD-10-CM

## 2014-07-29 DIAGNOSIS — E1041 Type 1 diabetes mellitus with diabetic mononeuropathy: Secondary | ICD-10-CM

## 2014-07-29 DIAGNOSIS — I10 Essential (primary) hypertension: Secondary | ICD-10-CM

## 2014-07-29 DIAGNOSIS — E1065 Type 1 diabetes mellitus with hyperglycemia: Secondary | ICD-10-CM

## 2014-07-29 DIAGNOSIS — E1049 Type 1 diabetes mellitus with other diabetic neurological complication: Secondary | ICD-10-CM

## 2014-07-29 LAB — URINALYSIS, ROUTINE W REFLEX MICROSCOPIC
Bilirubin Urine: NEGATIVE
Hgb urine dipstick: NEGATIVE
KETONES UR: NEGATIVE
Leukocytes, UA: NEGATIVE
Nitrite: NEGATIVE
PH: 6 (ref 5.0–8.0)
RBC / HPF: NONE SEEN (ref 0–?)
SPECIFIC GRAVITY, URINE: 1.02 (ref 1.000–1.030)
TOTAL PROTEIN, URINE-UPE24: NEGATIVE
Urine Glucose: >=1000 — AB
Urobilinogen, UA: 0.2 (ref 0.0–1.0)

## 2014-07-29 LAB — BASIC METABOLIC PANEL
BUN: 7 mg/dL (ref 6–23)
CHLORIDE: 94 meq/L — AB (ref 96–112)
CO2: 31 meq/L (ref 19–32)
Calcium: 8.8 mg/dL (ref 8.4–10.5)
Creatinine, Ser: 0.9 mg/dL (ref 0.4–1.5)
GFR: 99.48 mL/min (ref >60.00)
GLUCOSE: 185 mg/dL — AB (ref 70–99)
Potassium: 5.1 mEq/L (ref 3.5–5.1)
Sodium: 131 mEq/L — ABNORMAL LOW (ref 135–145)

## 2014-07-29 LAB — T4, FREE: Free T4: 1.01 ng/dL (ref 0.60–1.60)

## 2014-07-29 LAB — MICROALBUMIN / CREATININE URINE RATIO
Creatinine,U: 136.6 mg/dL
MICROALB/CREAT RATIO: 2.9 mg/g (ref 0.0–30.0)
Microalb, Ur: 3.9 mg/dL — ABNORMAL HIGH (ref 0.0–1.9)

## 2014-07-29 LAB — HEMOGLOBIN A1C: Hgb A1c MFr Bld: 8.4 % — ABNORMAL HIGH (ref 4.6–6.5)

## 2014-07-29 LAB — TSH: TSH: 1.47 u[IU]/mL (ref 0.35–4.50)

## 2014-07-29 NOTE — Patient Instructions (Addendum)
Supper Novolog AT LEAST 7 UNITS  Lantus 26 in pm and keep at 36 in am  If Toujeo insulin covered switch the dose to one shot of 65 units in am ONLY; need follow up in 3 weeks

## 2014-07-29 NOTE — Progress Notes (Signed)
Patient ID: Peter NewnessEarl T Stickley Jr., male   DOB: 09/08/1960, 53 y.o.   MRN: [id number]   Reason for Appointment: Diabetes follow-up   History of Present Illness   Diagnosis: Type 1 diabetes mellitus, diagnosis 1991.  He has been on basal bolus insulin regimen for a few years His A1c previously was consistently around 7-7.5 He tends to have significant fluctuation in blood sugars at all times. He has difficulty keeping his postprandial blood sugars consistent with mealtime insulin coverage and estimating the amount of insulin needed for various kinds of foods since he does not count carbohydrates A1c has been higher since 2014  The insulin regimen is described as premeal rapid analog 5-6 usually, Lantus 36 in 7a.m. and 24 at 7 p.m..  Recent history: His blood sugars are overall higher than the last time and not clear why He appears to be taking at least 1-2 units less mealtime coverage of the NovoLog He does not adjust his Lantus on his own even though his fasting readings are mostly high Also not having significant hypoglycemia except for a couple of occasions Current problems and blood sugar patterns  Variability in blood sugars at all times, most significantly in the mornings and some overnight.    Fasting blood sugars and overnight blood sugars are showing median readings of 200 or more  He has usually arise of blood sugar between supper and 9 PM when he checks it.  He will sometimes take as much as 4 units of correction doses of NovoLog after his evening meal when blood sugar is high but this does not cause please night hypoglycemia  Has not had any significant hypoglycemia recently and lowest sugar was 50 at about 4:30 PM  Usually compliant with taking the Humalog with his meal  But recently not trying to adjust it based on meal size.  He will take 1-2 units more when blood sugars are higher before eating  [name] still trying to monitor about 6-7 times a day including overnight Blood  Glucose readings:  PRE-MEAL Breakfast Lunch Dinner Bedtime Overall  Glucose range:  154-302   116-261   71-256     Mean/median:  242   194   135   184   POST-MEAL PC Breakfast PC Lunch PC Dinner  Glucose range   79-166   50-302   115-250  Mean/median:   195 189      PREMEAL Breakfast Lunch Dinner  PCS  Overall  Glucose range:  65-300   39-239   74-168   74-257    Mean/median:  154   138   166  158   Hypoglycemia frequency: Infrequent recently, having occasional low sugars midmorning and once before lunch. Recently had significant low blood sugar with trying to correct the high reading in the morning  Hypoglycemic awareness: Usually develops symptoms when blood glucose is less than 50, usually sweating.   Meals: Inconsistent schedule, Inconsistent quantity. Breakfast is at 6-8 am Supper 6-7 pm  Cereal in ams; 1/2 sandwich hs;  no night snack Carbohydrate intake: at snacks: some more chicken and [place], less meat  Physical activity: exercise: minimal Dietician visit: Most recent:, 4/09.   Wt Readings from Last 3 Encounters:  07/29/14 151 lb 9.6 oz (68.765 kg)  05/09/14 151 lb (68.493 kg)  04/29/14 149 lb 9.6 oz (67.858 kg)    Lab Results  Component Value Date   HGBA1C 7.8* 04/29/2014   HGBA1C 7.8* 01/27/2014   HGBA1C 7.9* 10/28/2013  Lab Results  Component Value Date   MICROALBUR 0.7 10/28/2013   LDLCALC 87 04/29/2014   CREATININE 0.9 04/29/2014    Complications: retinopathy, peripheral neuropathy.  Diabetic nephropathy: None, Last microalbumin normal.       Medication List       This list is accurate as of: 07/29/14 10:11 AM.  Always use your most recent med list.               alprazolam 2 MG tablet  Commonly known as:  XANAX  2 mg.     glucose blood test strip  Use to check blood sugars 7 times per day dx code E10.41     ibuprofen 200 MG tablet  Commonly known as:  ADVIL,MOTRIN  Take 200 mg by mouth every 6 (six) hours as needed.     Insulin  Glargine 100 UNIT/ML Solostar Pen  Commonly known as:  LANTUS  Inject 36 units in am and 34 units in pm     insulin lispro 100 UNIT/ML KiwkPen  Commonly known as:  HUMALOG  6-8 units 4 times per day     onetouch ultrasoft lancets  Use as instructed to check blood sugars 7 times per day     oxycodone 30 MG immediate release tablet  Commonly known as:  ROXICODONE     PEN NEEDLES 31GX5/16" 31G X 8 MM Misc  Use to check blood sugar 7 times per day     ramipril 10 MG capsule  Commonly known as:  ALTACE  Take 10 mg by mouth as needed (take as needed for htn).        Allergies:  Allergies  Allergen Reactions  . Pravastatin Sodium     myalgia    Past Medical History  Diagnosis Date  . Low back pain   . Tobacco abuse   . Diabetes 1.5, managed as type 1   . Hypertension   . Claudication     No past surgical history on file.  Family History  Problem Relation Age of Onset  . Diabetes Mother   . Diabetes Maternal Uncle   . Diabetes Maternal Grandmother   . Cancer Maternal Grandfather   . Diabetes Maternal Grandfather   . Heart Problems Mother     enlarged heart  . Heart attack Father   . Stroke Father   . Heart attack Sister     at age 54  . Heart attack Maternal Grandmother     Social History:  reports that he has been smoking Cigarettes.  He has been smoking about 0.50 packs per day. He does not have any smokeless tobacco history on file. His alcohol and drug histories are not on file.  Review of Systems    HYPONATREMIA: Long-standing and asymptomatic. 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  Cortrosyn stimulation test was also normal   Urinary sodium previously was 138 and U Osm 245 Fluid intake has been not monitored and he may be drinking more water  recently.  Avoiding Gatorade and other fluids On his last visit sodium was 127    Demeclocycline was prescribed previously but  not covered by insurance.   Chronic back pain taking narcotic medications Visual loss from retinopathy, to see his eye doctor today   He has had symptoms of  neuropathy, diabetic foot exam on 07/29/13  HYPERTENSION: Well controlled, he also monitors periodically at drug store. Current regimen is  ramipril 10 mg only. Home blood pressure checks as low as 113 and otherwise around 128  Smoking cessation: [name] cutting back on his smoking and using a vapor inhalation now, is still on half a pack    Examination:   BP 139/59 mmHg  Pulse 63  Temp(Src) 98.5 F (36.9 C)  Resp 14  Ht 5\' 11"  (1.803 m)  Wt 151 lb 9.6 oz (68.765 kg)  BMI 21.15 kg/m2  SpO2 96%  Body mass index is 21.15 kg/(m^2).    no ankle edema   Assesment/PLAN:  1. Diabetes type 1, uncontrolled    His blood sugars are overall appearing higher on this visit Most of his high readings are after supper and overnight and this is fairly consistent now See history of present illness for detailed discussion of current blood sugar patterns and problems identified Does not appear to be going off his diet to cause the higher blood sugars  [name] not able to adjust Lantus on his own even when instructed on changing the evening dose based on fasting blood sugars Currently does not appear to be getting enough coverage of his evening meal and he will take at least 2 more units on a regular basis Also increase his supper Lantus by at least 2 units Will check to see if Nathen May is covered by his insurance and will try 65 units in the morning daily   Has not had significant hypoglycemia  Will check A1c today   2.  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  Usually has better sodium levels when he does restrict his fluids . Urine  osmolality is nearly the same as serum osmolality  He been asymptomatic even with levels below 130 and will  repeat his level today Discussed needing to  consistently reduce his fluid intake  especially water   3. Hypertension is well controlled with ramipril alone     4. LIPIDS:  Well controlled as of 9/15   Counseling time over 50% of today's 25 minute visit  Patient Instructions  Supper Novolog AT LEAST 7 UNITS  Lantus 26 in pm and keep at 36 in am  If Toujeo insulin covered switch the dose to one shot of 65 units in am ONLY; need follow up in 3 weeks     Tydus Sanmiguel 07/29/2014, 10:11 AM

## 2014-07-30 NOTE — Progress Notes (Signed)
Quick Note:  Please let patient know that the A1c result is 8.4, sodium better  ______

## 2014-08-22 ENCOUNTER — Ambulatory Visit: Payer: Medicare Other | Admitting: Endocrinology

## 2014-09-15 ENCOUNTER — Encounter: Payer: Self-pay | Admitting: Endocrinology

## 2014-09-15 ENCOUNTER — Ambulatory Visit (INDEPENDENT_AMBULATORY_CARE_PROVIDER_SITE_OTHER): Payer: Medicare Other | Admitting: Endocrinology

## 2014-09-15 VITALS — BP 156/84 | HR 58 | Temp 98.2°F | Resp 14 | Ht 71.0 in | Wt 156.2 lb

## 2014-09-15 DIAGNOSIS — E1065 Type 1 diabetes mellitus with hyperglycemia: Secondary | ICD-10-CM

## 2014-09-15 DIAGNOSIS — I10 Essential (primary) hypertension: Secondary | ICD-10-CM | POA: Diagnosis not present

## 2014-09-15 DIAGNOSIS — IMO0002 Reserved for concepts with insufficient information to code with codable children: Secondary | ICD-10-CM

## 2014-09-15 NOTE — Progress Notes (Signed)
Patient ID: Peter Mendez., male   DOB: 12-11-1960, 54 y.o.   MRN: [id number]   Reason for Appointment: Diabetes follow-up   History of Present Illness   Diagnosis: Type 1 diabetes mellitus, diagnosis 1991.  He has been on basal bolus insulin regimen for a few years His A1c previously was consistently around 7-7.5 He tends to have significant fluctuation in blood sugars at all times. He has difficulty keeping his postprandial blood sugars consistent with mealtime insulin coverage and estimating the amount of insulin needed for various kinds of foods since he does not count carbohydrates A1c has been higher since 2014  The insulin regimen is described as premeal rapid Humalog 3-4 usually, Toujeo 46-48 in a.m. (was on Lantus 36 in 7a.m. and 24 at 7 p.m.).   Recent history: His blood sugars are significantly better with switching to Toujeo about a month ago. He was started on 65 units but because of significantly low blood sugars down to about 30 he has cut back on this However he still is tending to have low sugars overnight and they may be low normal in the morning. He thinks he is also taking about 2 units less Humalog at his meals because of tendency to lower readings after eating   Current problems and blood sugar patterns  Variability in blood sugars at all times with standard deviation 61  His fasting blood sugars are quite variable but usually would be higher if he has lower readings during the night and occasionally may be lower if taking a correction dose at 2 or 3 AM  Blood sugars are modestly higher around lunchtime but not consistent  Has had somewhat higher readings after lunch at times but usually better by suppertime  Blood sugars are on average the lowest before supper  Blood sugars after supper are recently better but they have been previously higher and are the highest of the day  Low blood sugar episodes are sporadically occurring during the night and randomly at  lunch or afternoon but has only 8.5% readings below 70.  Overall highest blood sugar on average is midmorning   Usually compliant with taking the Humalog with his meal  But recently not trying to adjust it based on meal size.  He will take 1-2 units more when blood sugars are higher before eating  [name] still trying to monitor about 6-7 times a day including overnight Blood Glucose readings:  As follows, overnight blood sugars are averaging 140 with overall average 153  PRE-MEAL Breakfast Lunch Dinner Bedtime Overall  Glucose range:  52-272   69-254   40-236   117-183    median:  188   178   125   130  150    Hypoglycemia frequency: Infrequent recently as above  Hypoglycemic awareness: Usually develops symptoms when blood glucose is less than 50, usually sweating.   Meals: Inconsistent schedule, Inconsistent quantity. Breakfast is at 6-8 am Supper 6-7 pm  Cereal in ams; 1/2 sandwich hs;  no night snack Carbohydrate intake: at snacks: some more chicken and [place], less meat  Physical activity: exercise: minimal Dietician visit: Most recent:, 4/09.   Wt Readings from Last 3 Encounters:  09/15/14 156 lb 3.2 oz (70.852 kg)  07/29/14 151 lb 9.6 oz (68.765 kg)  05/09/14 151 lb (68.493 kg)    Lab Results  Component Value Date   HGBA1C 8.4* 07/29/2014   HGBA1C 7.8* 04/29/2014   HGBA1C 7.8* 01/27/2014   Lab Results  Component  Value Date   MICROALBUR 3.9* 07/29/2014   LDLCALC 87 04/29/2014   CREATININE 0.9 07/29/2014    Complications: retinopathy, peripheral neuropathy.  Diabetic nephropathy: None, Last microalbumin normal.       Medication List       This list is accurate as of: 09/15/14 11:59 PM.  Always use your most recent med list.               alprazolam 2 MG tablet  Commonly known as:  XANAX  2 mg.     glucose blood test strip  Use to check blood sugars 7 times per day dx code E10.41     ibuprofen 200 MG tablet  Commonly known as:  ADVIL,MOTRIN  Take 200  mg by mouth every 6 (six) hours as needed.     Insulin Glargine 100 UNIT/ML Solostar Pen  Commonly known as:  LANTUS  Inject 36 units in am and 34 units in pm     TOUJEO SOLOSTAR 300 UNIT/ML Sopn  Generic drug:  Insulin Glargine  Inject 50 Units into the skin daily.     insulin lispro 100 UNIT/ML KiwkPen  Commonly known as:  HUMALOG  6-8 units 4 times per day     onetouch ultrasoft lancets  Use as instructed to check blood sugars 7 times per day     oxycodone 30 MG immediate release tablet  Commonly known as:  ROXICODONE     PEN NEEDLES 31GX5/16" 31G X 8 MM Misc  Use to check blood sugar 7 times per day     ramipril 10 MG capsule  Commonly known as:  ALTACE  Take 10 mg by mouth as needed (take as needed for htn).        Allergies:  Allergies  Allergen Reactions  . Pravastatin Sodium     myalgia    Past Medical History  Diagnosis Date  . Low back pain   . Tobacco abuse   . Diabetes 1.5, managed as type 1   . Hypertension   . Claudication     No past surgical history on file.  Family History  Problem Relation Age of Onset  . Diabetes Mother   . Diabetes Maternal Uncle   . Diabetes Maternal Grandmother   . Cancer Maternal Grandfather   . Diabetes Maternal Grandfather   . Heart Problems Mother     enlarged heart  . Heart attack Father   . Stroke Father   . Heart attack Sister     at age 55  . Heart attack Maternal Grandmother     Social History:  reports that he has been smoking Cigarettes.  He has been smoking about 0.50 packs per day. He does not have any smokeless tobacco history on file. His alcohol and drug histories are not on file.  Review of Systems    HYPONATREMIA: Long-standing and asymptomatic. 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    Demeclocycline was prescribed previously but  not covered by insurance.  Cortrosyn stimulation test was also normal   Urinary sodium previously was 138 and U Osm 245  Fluid intake has been more modest recently.  Avoiding Gatorade and other fluids On his last visit sodium was improved    Chemistry      Component Value Date/Time  NA 131* 07/29/2014 1031   K 5.1 07/29/2014 1031   CL 94* 07/29/2014 1031   CO2 31 07/29/2014 1031   BUN 7 07/29/2014 1031   CREATININE 0.9 07/29/2014 1031      Component Value Date/Time   CALCIUM 8.8 07/29/2014 1031   ALKPHOS 56 04/29/2014 1010   AST 18 04/29/2014 1010   ALT 9 04/29/2014 1010   BILITOT 0.4 04/29/2014 1010      Chronic back pain taking narcotic medications Visual loss from retinopathy, to see his eye doctor today   He has had symptoms of neuropathy, diabetic foot exam on 07/29/13  HYPERTENSION: Blood pressure is higher because he ran out of his medication  Well controlled, he also monitors periodically at drug store. Current regimen is  ramipril 10 mg only. Home blood pressure checks 120-136  Smoking cessation: [name] interested in reducing  his smoking and using a vapor inhalation now, is still on half a pack    Examination:   BP 156/84 mmHg  Pulse 58  Temp(Src) 98.2 F (36.8 C)  Resp 14  Ht  (1.803 m)  Wt 156 lb 3.2 oz (70.852 kg)  BMI 21.80 kg/m2  SpO2 98%  Body mass index is 21.8 kg/(m^2).    no ankle edema   Assesment/PLAN:  1. Diabetes type 1, uncontrolled    His blood sugars are overall appearing much better with his switching to Toujeo instead of Lantus For some reason he is requiring much less basal insulin also with this change Also with Toujeo he is taking 1-2 units less at least obtained mealtime insulin  [name] overall having somewhat less variability However he still is tending to have lower readings overnight and in the mornings and probably needs less basal insulin again Discussed again  adjusting mealtime dose based on amount of carbohydrate and overall meal size and may need somewhat higher doses at lunch; however postprandial readings are quite variable and he probably will not follow carbohydrate counting Has not had significant hypoglycemia since he cut back on his Toujeo except once in the afternoon with glucose 44 Will check A1c on his next visit since he had one less than 2 months ago   2.  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  Usually has better sodium levels when he does restrict his fluids and his last level was excellent at 131. Labs to be checked on the next visit  3. Hypertension is well controlled with ramipril usually but higher today because of running out of medications   4. LIPIDS:  Well controlled as of 9/15   Counseling time over 50% of today's 25 minute visit  Patient Instructions  Toujeo 45 units daily and keep overnight sugars in the 100-130 rage         Erasmus Bistline 09/16/2014, 12:02 PM

## 2014-09-15 NOTE — Patient Instructions (Signed)
Toujeo 45 units daily and keep overnight sugars in the 100-130 rage

## 2014-11-14 ENCOUNTER — Ambulatory Visit (INDEPENDENT_AMBULATORY_CARE_PROVIDER_SITE_OTHER): Payer: Medicare Other | Admitting: Endocrinology

## 2014-11-14 ENCOUNTER — Encounter: Payer: Self-pay | Admitting: Endocrinology

## 2014-11-14 VITALS — BP 135/70 | HR 80 | Temp 98.0°F | Wt 154.0 lb

## 2014-11-14 DIAGNOSIS — I10 Essential (primary) hypertension: Secondary | ICD-10-CM

## 2014-11-14 DIAGNOSIS — E871 Hypo-osmolality and hyponatremia: Secondary | ICD-10-CM

## 2014-11-14 DIAGNOSIS — E7800 Pure hypercholesterolemia, unspecified: Secondary | ICD-10-CM

## 2014-11-14 DIAGNOSIS — E1065 Type 1 diabetes mellitus with hyperglycemia: Secondary | ICD-10-CM | POA: Diagnosis not present

## 2014-11-14 DIAGNOSIS — IMO0002 Reserved for concepts with insufficient information to code with codable children: Secondary | ICD-10-CM

## 2014-11-14 LAB — COMPREHENSIVE METABOLIC PANEL
ALBUMIN: 3.6 g/dL (ref 3.5–5.2)
ALK PHOS: 59 U/L (ref 39–117)
ALT: 10 U/L (ref 0–53)
AST: 20 U/L (ref 0–37)
BILIRUBIN TOTAL: 0.4 mg/dL (ref 0.2–1.2)
BUN: 12 mg/dL (ref 6–23)
CO2: 33 mEq/L — ABNORMAL HIGH (ref 19–32)
CREATININE: 0.91 mg/dL (ref 0.40–1.50)
Calcium: 9.1 mg/dL (ref 8.4–10.5)
Chloride: 93 mEq/L — ABNORMAL LOW (ref 96–112)
GFR: 91.85 mL/min (ref >60.00)
GLUCOSE: 98 mg/dL (ref 70–99)
Potassium: 4.7 mEq/L (ref 3.5–5.1)
Sodium: 128 mEq/L — ABNORMAL LOW (ref 135–145)
Total Protein: 6.4 g/dL (ref 6.0–8.3)

## 2014-11-14 LAB — HEMOGLOBIN A1C: Hgb A1c MFr Bld: 7.5 % — ABNORMAL HIGH (ref 4.6–6.5)

## 2014-11-14 NOTE — Progress Notes (Signed)
Pre visit review using our clinic review tool, if applicable. No additional management support is needed unless otherwise documented below in the visit note. 

## 2014-11-14 NOTE — Patient Instructions (Addendum)
Humalog average 7 units at supper and extra 1 unit if 200 and 2 units if over 250  No extra Humalog during night  LANTUS 38 IN AM AND 30 AT NIGHT  Stop smoking

## 2014-11-14 NOTE — Progress Notes (Signed)
Patient ID: Peter Mendez., male   DOB: 1960-09-11, 54 y.o.   MRN: [id number]   Reason for Appointment: Diabetes follow-up   History of Present Illness   Diagnosis: Type 1 diabetes mellitus, diagnosis 1991.  He has been on basal bolus insulin regimen for a few years His A1c previously was consistently around 7-7.5 He tends to have significant fluctuation in blood sugars at all times. He has difficulty keeping his postprandial blood sugars consistent with mealtime insulin coverage and estimating the amount of insulin needed for various kinds of foods since he does not count carbohydrates A1c has been higher since 2014  The insulin regimen is described as premeal rapid Humalog 4-6 usually,  Toujeo 46-48 in a.m. (was on  Lantus 36 in 7a.m. and 32 at 7 p.m.   Recent history: His blood sugars were significantly better with switching to Toujeo in 08/2014 Although he was able to reduce the dose of his insulin and he was told continued reducing it if he has low sugars see stopped taking this about 2 months ago because he thought it was causing excessive hypoglycemia overnight He has gone back to taking LANTUS insulin but is taking more insulin in the evening than he used to previously. Surprisingly his blood sugars are overall somewhat better even with Lantus However he is appearing to need more mealtime coverage with using Lantus His last A1c had been 8.4 in 12/15   Current problems and blood sugar patterns  Variability in blood sugars is present at all times with standard deviation 67, slightly more than before  His fasting blood sugars again inconsistent.  However this is partly determined by the fact that he sometimes will have lower high readings during the night and will take extra NovoLog or eat some food based on his blood sugar during the night.  He will get some low sugars early morning if he takes extra 2-3 units of NovoLog for high readings at 2-4 AM  SUPPERTIME blood sugars are  sometimes significantly over 200 but partly depend on his blood sugars after lunch which also are inconsistent; overall blood sugars are higher at suppertime than on his last visit  Blood sugars are on average the lowest OVERNIGHT  Blood sugars after supper are recently better but they have been previously higher and are the highest of the day  Low blood sugar episodes are sporadically between 5 AM-9 AM mostly but generally related to taking extra NovoLog in the night, has only one low sugar around supper time last month  He thinks his blood sugars are much higher around the end of last month with going off his diet  Overall highest blood sugar on average is before supper time  Usually compliant with taking the Humalog with his meal although recently not trying to adjust it based on meal size.   He will take 1-2 units more when blood sugars are higher before eating  [name] still trying to monitor about 6-7 times a day including overnight Blood Glucose readings:  As follows, overnight blood sugars are averaging 140 with overall average 153  PRE-MEAL Breakfast Lunch Dinner Bedtime Overall  Glucose range:  62-374   66-261   50-313   49-288    median:  145   151   171   166   151    Hypoglycemia frequency: Infrequent recently as above  Hypoglycemic awareness: Usually develops symptoms when blood glucose is less than 50, usually sweating.   Meals: Inconsistent schedule,  Inconsistent quantity. Breakfast is at 6-8 am Supper 6-7 pm  Cereal in ams; 1/2 sandwich hs;  no night snack Carbohydrate intake: at snacks: some more chicken and [place], less meat  Physical activity: exercise: minimal Dietician visit: Most recent:, 4/09.   Wt Readings from Last 3 Encounters:  11/14/14 154 lb (69.854 kg)  09/15/14 156 lb 3.2 oz (70.852 kg)  07/29/14 151 lb 9.6 oz (68.765 kg)    Lab Results  Component Value Date   HGBA1C 7.5* 11/14/2014   HGBA1C 8.4* 07/29/2014   HGBA1C 7.8* 04/29/2014   Lab  Results  Component Value Date   MICROALBUR 3.9* 07/29/2014   LDLCALC 87 04/29/2014   CREATININE 0.91 11/14/2014    Complications: retinopathy, peripheral neuropathy.  Diabetic nephropathy: None, Last microalbumin normal.       Medication List       This list is accurate as of: 11/14/14 11:59 PM.  Always use your most recent med list.               alprazolam 2 MG tablet  Commonly known as:  XANAX  2 mg.     EVZIO 0.4 MG/0.4ML Soaj  Generic drug:  Naloxone HCl     glucose blood test strip  Use to check blood sugars 7 times per day dx code E10.41     ibuprofen 200 MG tablet  Commonly known as:  ADVIL,MOTRIN  Take 200 mg by mouth every 6 (six) hours as needed.     Insulin Glargine 100 UNIT/ML Solostar Pen  Commonly known as:  LANTUS  Inject 36 units in am and 34 units in pm     TOUJEO SOLOSTAR 300 UNIT/ML Sopn  Generic drug:  Insulin Glargine  Inject 50 Units into the skin daily.     insulin lispro 100 UNIT/ML KiwkPen  Commonly known as:  HUMALOG  6-8 units 4 times per day     onetouch ultrasoft lancets  Use as instructed to check blood sugars 7 times per day     oxycodone 30 MG immediate release tablet  Commonly known as:  ROXICODONE     PEN NEEDLES 31GX5/16" 31G X 8 MM Misc  Use to check blood sugar 7 times per day     ramipril 10 MG capsule  Commonly known as:  ALTACE  Take 10 mg by mouth as needed (take as needed for htn).     triamcinolone cream 0.1 %  Commonly known as:  KENALOG        Allergies:  Allergies  Allergen Reactions  . Pravastatin Sodium     myalgia    Past Medical History  Diagnosis Date  . Low back pain   . Tobacco abuse   . Diabetes 1.5, managed as type 1   . Hypertension   . Claudication     No past surgical history on file.  Family History  Problem Relation Age of Onset  . Diabetes Mother   . Diabetes Maternal Uncle   . Diabetes Maternal Grandmother   . Cancer Maternal Grandfather   . Diabetes Maternal  Grandfather   . Heart Problems Mother     enlarged heart  . Heart attack Father   . Stroke Father   . Heart attack Sister     at age 55  . Heart attack Maternal Grandmother     Social History:  reports that he has been smoking Cigarettes.  He has been smoking about 0.33 packs per day. He does not have any smokeless  tobacco history on file. His alcohol and drug histories are not on file.  Review of Systems    HYPONATREMIA: Long-standing and asymptomatic. 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   Demeclocycline was prescribed previously but  not covered by insurance.  Cortrosyn stimulation test was also normal   Urinary sodium previously was 138 and U Osm 245  Fluid intake has been not excessive recently.  Avoiding Gatorade and other fluids On his last visit sodium was improved Again does not complain of any unusual decrease in appetite, nausea or fatigue  Lab Results  Component Value Date   CREATININE 0.91 11/14/2014   BUN 12 11/14/2014   NA 128* 11/14/2014   K 4.7 11/14/2014   CL 93* 11/14/2014   CO2 33* 11/14/2014    Chronic back pain taking narcotic medications  Visual loss from retinopathy, followed by ophthalmologist  He has had symptoms of neuropathy, last diabetic foot exam on 11/14/14  HYPERTENSION: Blood pressure is well-controlled He also monitors periodically at drug store.  Current regimen is  ramipril 10 mg only. Home blood pressure checks 120-136  Smoking cessation: [name] interested in reducing  his smoking but still not able to reduce very much, 1 pack lasts him about 2-3 days   Examination:   BP 135/70 mmHg  Pulse 80  Temp(Src) 98 F (36.7 C)  Wt 154 lb (69.854 kg)  SpO2 97%  Body mass index is 21.49 kg/(m^2).    no ankle edema   He has  decreased monofilament sensation on some of his toes.  Absent pedal pulses Inspection normal  Assesment/PLAN:  1. Diabetes type 1, uncontrolled    His blood sugars are overall appearing somewhat better recently even though he has gone back to Lantus instead of Toujeo See history of present illness for detailed discussion of current blood sugar patterns, problems identified and current management Although he did require less insulin with Toujeo and was doing well with once a day dosage he did not like to continue this because of hypoglycemia Also was getting somewhat less fluctuation and lower mealtime dose requirement He has however taken relatively larger doses of Lantus in the evening and has some tendency to lower readings overnight but higher readings before supper  Also does not appear to be covering his meals consistently well especially at lunch and occasionally at supper also Hypoglycemia: Some of this is related to excessive correction of high readings during the night when he is not eating Also tends to have some hyperglycemia with getting excessive snacks when blood sugars are low normal His A1c needs to be rechecked, previously has been mostly over 8%  Have discussed adjustment of both basal and bolus insulins, needs 2 units less in the evening and 2 units more in the morning of Lantus To increase suppertime dose by about 2 units also and take higher doses at lunch when eating more carbohydrate or larger meals   2.  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  Usually has better sodium levels when he does restrict his fluids but he is not consistent with this. Labs to be checked today  3. Hypertension is well controlled with ramipril   4. LIPIDS:  Well controlled as of  9/15   5.  Peripheral vascular disease: [name] asymptomatic without claudication but apparently does have atherosclerosis as discussed  by cardiologist on his note.  Reminded him to try and quit smoking now  Counseling time over 50% of today's 25 minute visit  Patient Instructions  Humalog average 7 units at supper and extra 1 unit if 200 and 2 units if over 250  No extra Humalog during night  LANTUS 38 IN AM AND 30 AT NIGHT  Stop smoking     Dezmond Downie 11/16/2014, 8:45 PM

## 2014-11-15 NOTE — Progress Notes (Signed)
Quick Note:  Please let patient know that the A1c better at 7.5, sodium still slightly low at 128  ______

## 2014-12-31 ENCOUNTER — Other Ambulatory Visit: Payer: Self-pay | Admitting: *Deleted

## 2014-12-31 MED ORDER — INSULIN GLARGINE 100 UNIT/ML SOLOSTAR PEN: PEN_INJECTOR | SUBCUTANEOUS | Status: DC

## 2015-02-17 ENCOUNTER — Other Ambulatory Visit: Payer: Self-pay | Admitting: *Deleted

## 2015-02-17 ENCOUNTER — Other Ambulatory Visit (INDEPENDENT_AMBULATORY_CARE_PROVIDER_SITE_OTHER): Payer: Medicare Other

## 2015-02-17 DIAGNOSIS — E7800 Pure hypercholesterolemia, unspecified: Secondary | ICD-10-CM

## 2015-02-17 DIAGNOSIS — E1065 Type 1 diabetes mellitus with hyperglycemia: Secondary | ICD-10-CM

## 2015-02-17 DIAGNOSIS — IMO0002 Reserved for concepts with insufficient information to code with codable children: Secondary | ICD-10-CM

## 2015-02-17 LAB — BASIC METABOLIC PANEL
BUN: 7 mg/dL (ref 6–23)
CALCIUM: 9.1 mg/dL (ref 8.4–10.5)
CO2: 33 mEq/L — ABNORMAL HIGH (ref 19–32)
CREATININE: 0.83 mg/dL (ref 0.40–1.50)
Chloride: 94 mEq/L — ABNORMAL LOW (ref 96–112)
GFR: 102.05 mL/min (ref >60.00)
Glucose, Bld: 156 mg/dL — ABNORMAL HIGH (ref 70–99)
Potassium: 4.8 mEq/L (ref 3.5–5.1)
SODIUM: 131 meq/L — AB (ref 135–145)

## 2015-02-17 LAB — LIPID PANEL
CHOL/HDL RATIO: 3
Cholesterol: 130 mg/dL (ref 0–200)
HDL: 47 mg/dL (ref >39.00)
LDL Cholesterol: 75 mg/dL (ref 0–99)
NonHDL: 83
Triglycerides: 38 mg/dL (ref 0.0–149.0)
VLDL: 7.6 mg/dL (ref 0.0–40.0)

## 2015-02-17 LAB — HEMOGLOBIN A1C: HEMOGLOBIN A1C: 7.3 % — AB (ref 4.6–6.5)

## 2015-02-17 LAB — MICROALBUMIN / CREATININE URINE RATIO
Creatinine,U: 108.5 mg/dL
Microalb Creat Ratio: 2.3 mg/g (ref 0.0–30.0)
Microalb, Ur: 2.5 mg/dL — ABNORMAL HIGH (ref 0.0–1.9)

## 2015-02-17 MED ORDER — ALPRAZOLAM 2 MG PO TABS: ORAL_TABLET | ORAL | Status: DC

## 2015-02-20 ENCOUNTER — Ambulatory Visit (INDEPENDENT_AMBULATORY_CARE_PROVIDER_SITE_OTHER): Payer: Medicare Other | Admitting: Endocrinology

## 2015-02-20 ENCOUNTER — Encounter: Payer: Self-pay | Admitting: Endocrinology

## 2015-02-20 VITALS — BP 128/60 | HR 64 | Temp 98.3°F | Resp 16 | Ht 71.0 in | Wt 152.8 lb

## 2015-02-20 DIAGNOSIS — E1041 Type 1 diabetes mellitus with diabetic mononeuropathy: Secondary | ICD-10-CM

## 2015-02-20 DIAGNOSIS — I10 Essential (primary) hypertension: Secondary | ICD-10-CM | POA: Diagnosis not present

## 2015-02-20 DIAGNOSIS — E7800 Pure hypercholesterolemia, unspecified: Secondary | ICD-10-CM

## 2015-02-20 DIAGNOSIS — E871 Hypo-osmolality and hyponatremia: Secondary | ICD-10-CM

## 2015-02-20 DIAGNOSIS — E1065 Type 1 diabetes mellitus with hyperglycemia: Principal | ICD-10-CM

## 2015-02-20 DIAGNOSIS — E1049 Type 1 diabetes mellitus with other diabetic neurological complication: Secondary | ICD-10-CM

## 2015-02-20 DIAGNOSIS — IMO0002 Reserved for concepts with insufficient information to code with codable children: Secondary | ICD-10-CM

## 2015-02-20 MED ORDER — RAMIPRIL 5 MG PO CAPS: 5.0000 mg | ORAL_CAPSULE | Freq: Every day | ORAL | Status: DC

## 2015-02-20 NOTE — Patient Instructions (Addendum)
Lantus 34 in am and 30 in pms  Mid-am snack with protein daily especially if active

## 2015-02-20 NOTE — Progress Notes (Signed)
Patient ID: Peter Mendez., male   DOB: 08/29/1960, 54 y.o.   MRN: [id number]   Reason for Appointment: Diabetes follow-up   History of Present Illness   Diagnosis: Type 1 diabetes mellitus, diagnosis 1991.  He has been on basal bolus insulin regimen for a few years His A1c previously was consistently around 7-7.5 He tends to have significant fluctuation in blood sugars at all times. He has difficulty keeping his postprandial blood sugars consistent with mealtime insulin coverage and estimating the amount of insulin needed for various kinds of foods since he does not count carbohydrates A1c has been higher since 2014  Recent history:  The insulin regimen is described as premeal rapid Humalog 4-6 usually,  Lantus 36 in 7 a.m. and 32 at 7 p.m.   His blood sugars appear to be overall better recently and A1c has improved from previous levels of around 8% [name] still very compliant with checking his blood sugars on average about 7 times a day including some during the night   Current problems and blood sugar patterns  Variability in blood sugars is present at all times as before with standard deviation 67  His fasting blood sugars are more consistent now and overall excellent  He still has a  trend towards higher blood sugars before and after supper but not consistently  POSTPRANDIAL blood sugars after meals are generally not high including after supper but tend to be be variable after the evening meal  Mealtime coverage: [name] still using somewhat arbitrary doses of insulin and not doing carbohydrate counting; he takes 6 units for his oatmeal in the morning which seems to work fairly well  HYPOGLYCEMIA: This appears to be occurring more in the last week around lunchtime and he thinks this may be from being away from home and more active recently.  Does not have a midmorning snack  He does not know how to explain a reading of 44 last night at 3 AM, usually blood sugar during  the night or mostly high  Only occasionally will get low blood sugar related to taking a correction dose for high reading  Overall highest blood sugar on average is before supper time He will take 1-2 units more when blood sugars are higher before eating  Blood Glucose readings:   Mean values apply above for all meters except median for One Touch  PRE-MEAL Fasting Lunch  6 PM  Bedtime Overall  Glucose range:  75-160   35-276   81-266  78-255  35-394   Mean/median:  116   140    185   151, +/-67    Overnight blood sugar range 44-250 with median 194  Hypoglycemia awareness: Usually develops symptoms when blood glucose is less than 50, usually sweating.   Meals: Inconsistent schedule, Inconsistent quantity. Breakfast is at 6-8 am Supper 6-7 pm  oatmeal in ams; 1/2 sandwich hs;  no night snack Carbohydrate intake: at snacks: some more chicken and [place], less meat  Physical activity: exercise: minimal Dietician visit: Most recent:, 4/09.   Wt Readings from Last 3 Encounters:  02/20/15 152 lb 12.8 oz (69.31 kg)  11/14/14 154 lb (69.854 kg)  09/15/14 156 lb 3.2 oz (70.852 kg)    Lab Results  Component Value Date   HGBA1C 7.3* 02/17/2015   HGBA1C 7.5* 11/14/2014   HGBA1C 8.4* 07/29/2014   Lab Results  Component Value Date   MICROALBUR 2.5* 02/17/2015   LDLCALC 75 02/17/2015   CREATININE  0.83 02/17/2015    Complications: retinopathy, peripheral neuropathy.  Diabetic nephropathy: None, Last microalbumin normal.       Medication List       This list is accurate as of: 02/20/15  3:56 PM.  Always use your most recent med list.               alprazolam 2 MG tablet  Commonly known as:  XANAX  Take 1 table daily as needed for anxiety.     EVZIO 0.4 MG/0.4ML Soaj  Generic drug:  Naloxone HCl     glucose blood test strip  Use to check blood sugars 7 times per day dx code E10.41     ibuprofen 200 MG tablet  Commonly known as:  ADVIL,MOTRIN  Take 200 mg by mouth  every 6 (six) hours as needed.     Insulin Glargine 100 UNIT/ML Solostar Pen  Commonly known as:  LANTUS  Inject 36 units in am and 34 units in pm     insulin lispro 100 UNIT/ML KiwkPen  Commonly known as:  HUMALOG  6-8 units 4 times per day     onetouch ultrasoft lancets  Use as instructed to check blood sugars 7 times per day     oxycodone 30 MG immediate release tablet  Commonly known as:  ROXICODONE     PEN NEEDLES 31GX5/16" 31G X 8 MM Misc  Use to check blood sugar 7 times per day     ramipril 5 MG capsule  Commonly known as:  ALTACE  Take 1 capsule (5 mg total) by mouth daily.     triamcinolone cream 0.1 %  Commonly known as:  KENALOG        Allergies:  Allergies  Allergen Reactions  . Pravastatin Sodium     myalgia    Past Medical History  Diagnosis Date  . Low back pain   . Tobacco abuse   . Diabetes 1.5, managed as type 1   . Hypertension   . Claudication     No past surgical history on file.  Family History  Problem Relation Age of Onset  . Diabetes Mother   . Diabetes Maternal Uncle   . Diabetes Maternal Grandmother   . Cancer Maternal Grandfather   . Diabetes Maternal Grandfather   . Heart Problems Mother     enlarged heart  . Heart attack Father   . Stroke Father   . Heart attack Sister     at age 59  . Heart attack Maternal Grandmother     Social History:  reports that he has been smoking Cigarettes.  He has been smoking about 0.33 packs per day. He does not have any smokeless tobacco history on file. His alcohol and drug histories are not on file.  Review of Systems   Lipids: These are normal without medications  Lab Results  Component Value Date   CHOL 130 02/17/2015   HDL 47.00 02/17/2015   LDLCALC 75 02/17/2015   TRIG 38.0 02/17/2015   CHOLHDL 3 02/17/2015     HYPONATREMIA: Long-standing and asymptomatic. 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   Demeclocycline was prescribed previously but  not covered by insurance.  Cortrosyn stimulation test was also normal   Urinary sodium previously  was 138 and U Osm 245  Fluid intake has been not excessive recently.  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 He thinks that his blood pressure tends to get low frequently and does not know his readings however. Because his blood pressure may tend to get low he is taking the ramipril only about 3 days a week when the blood pressure is high normal  Anxiety: He takes Xanax regularly and was given a prescription recently.  Previously given by a pain clinic  Smoking cessation: [name] interested in reducing  his smoking but still not able to reduce this compared to his last visit,  1 pack lasts him about 2-3 days   Examination:   BP 128/60 mmHg  Pulse 64  Temp(Src) 98.3 F (36.8 C)  Resp 16  Ht 5\' 11"  (1.803 m)  Wt 152 lb 12.8 oz (69.31 kg)  BMI 21.32 kg/m2  SpO2 93%  Body mass index is 21.32 kg/(m^2).    Assesment/PLAN:  1. Diabetes type 1, uncontrolled    His blood sugars are overall appearing somewhat better recently even though he still has some variability See history of present illness for detailed discussion of current blood sugar patterns, problems identified and current  management More recently his fasting blood sugars are fairly consistent and near normal He has the most variability in his blood sugars overnight and not clear why he has a low sugar of 44 last night Most recently he tends to have hypoglycemia before lunch even though his midmorning blood sugars are okay His activity level is usually not much and he thinks he has been more active this week A1c is significantly better at 7.3  Discussed adjustment of mealtime doses based on meal size and carbohydrate intake and also adding protein He does need to have midmorning snack since he tends to have low sugars before lunch Because of his tendency to lower readings will reduce his Lantus by 2 units twice a day   He will continue his mealtime insulin unchanged for now discussed needing to have the next snack if he is more active    2.  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  Usually has better sodium levels when he does restrict his fluids and recently sodium is better at 131   3. Hypertension: He claims his blood pressure gets low with 10 mg ramipril and will reduce this to 5 mg  4. LIPIDS:  Well controlled as of7/16  Counseling time on subjects discussed above is over 50% of today's 25 minute visit   Patient Instructions  Lantus 34 in am and 30 in pms  Mid-am snack with protein daily especially if active     [facility],Azalia Neuberger 02/20/2015, 3:56 PM

## 2015-03-20 ENCOUNTER — Telehealth: Payer: Self-pay | Admitting: Endocrinology

## 2015-03-20 NOTE — Telephone Encounter (Signed)
The primary MD he found cannot give him an appt until after labor and he needs a refill to hold him over of the Alprazalam 2 mg [facility]

## 2015-05-20 ENCOUNTER — Other Ambulatory Visit (INDEPENDENT_AMBULATORY_CARE_PROVIDER_SITE_OTHER): Payer: Medicare Other

## 2015-05-20 DIAGNOSIS — E1065 Type 1 diabetes mellitus with hyperglycemia: Secondary | ICD-10-CM

## 2015-05-20 DIAGNOSIS — E1049 Type 1 diabetes mellitus with other diabetic neurological complication: Secondary | ICD-10-CM | POA: Diagnosis not present

## 2015-05-20 DIAGNOSIS — IMO0002 Reserved for concepts with insufficient information to code with codable children: Secondary | ICD-10-CM

## 2015-05-20 LAB — COMPREHENSIVE METABOLIC PANEL
ALT: 10 U/L (ref 0–53)
AST: 17 U/L (ref 0–37)
Albumin: 3.6 g/dL (ref 3.5–5.2)
Alkaline Phosphatase: 60 U/L (ref 39–117)
BUN: 6 mg/dL (ref 6–23)
CO2: 34 meq/L — AB (ref 19–32)
CREATININE: 0.85 mg/dL (ref 0.40–1.50)
Calcium: 9.1 mg/dL (ref 8.4–10.5)
Chloride: 97 mEq/L (ref 96–112)
GFR: 99.19 mL/min (ref >60.00)
Glucose, Bld: 85 mg/dL (ref 70–99)
Potassium: 5.1 mEq/L (ref 3.5–5.1)
Sodium: 133 mEq/L — ABNORMAL LOW (ref 135–145)
Total Bilirubin: 0.4 mg/dL (ref 0.2–1.2)
Total Protein: 6.3 g/dL (ref 6.0–8.3)

## 2015-05-20 LAB — HEMOGLOBIN A1C: HEMOGLOBIN A1C: 7.6 % — AB (ref 4.6–6.5)

## 2015-05-25 ENCOUNTER — Encounter: Payer: Self-pay | Admitting: Endocrinology

## 2015-05-25 ENCOUNTER — Other Ambulatory Visit: Payer: Self-pay | Admitting: *Deleted

## 2015-05-25 ENCOUNTER — Ambulatory Visit (INDEPENDENT_AMBULATORY_CARE_PROVIDER_SITE_OTHER): Payer: Medicare Other | Admitting: Endocrinology

## 2015-05-25 VITALS — BP 138/66 | HR 58 | Temp 98.2°F | Resp 14 | Ht 71.0 in | Wt 155.6 lb

## 2015-05-25 DIAGNOSIS — E1040 Type 1 diabetes mellitus with diabetic neuropathy, unspecified: Secondary | ICD-10-CM | POA: Insufficient documentation

## 2015-05-25 DIAGNOSIS — E1065 Type 1 diabetes mellitus with hyperglycemia: Secondary | ICD-10-CM

## 2015-05-25 DIAGNOSIS — Z23 Encounter for immunization: Secondary | ICD-10-CM

## 2015-05-25 MED ORDER — ALPRAZOLAM 2 MG PO TABS: ORAL_TABLET | ORAL | Status: DC

## 2015-05-25 NOTE — Progress Notes (Signed)
Patient ID: Peter Mendez., male   DOB: 1961/07/06, 54 y.o.   MRN: [id number]   Reason for Appointment: Diabetes follow-up   History of Present Illness   Diagnosis: Type 1 diabetes mellitus, diagnosis 1991.  He has been on basal bolus insulin regimen for a few years His A1c previously was consistently around 7-7.5 He tends to have significant fluctuation in blood sugars at all times. He has difficulty keeping his postprandial blood sugars consistent with mealtime insulin coverage and estimating the amount of insulin needed for various kinds of foods since he does not count carbohydrates A1c has been higher since 2014  Recent history:   The insulin regimen is premeal rapid Humalog 4-6 usually,  Lantus 36 in 7 a.m. and 32 at 7 p.m.   His blood sugars are again fluctuating as usual with inconsistent control A1c is slightly higher at 7.6 Overall A1c has improved from previous levels of around 8% [name] still very compliant with checking his blood sugars on average about 7 times a day including  during the night   Current problems and blood sugar patterns  His fasting blood sugars are variable and may depend on what his blood sugars are during the night; has frequently high readings but also a couple of times relatively low  Not clear why his blood sugars are mostly high OVERNIGHT even though he does not have a evening snack after 9 PM or so.  Most of his high readings are around 1-5 AM.  He does not think he takes any Novolog during the night  His blood sugar appears to be relatively lower if he takes his Lantus earlier in the morning before breakfast and he thinks that tends to  peak in 3 hours; he takes his LANTUS around suppertime in the evening  Blood sugars are relatively stable around lunchtime and suppertime  May tend to have sporadic low sugars around 4-6 PM  LOWEST blood sugars are before supper  Usually is not having excessive hyperglycemia after supper which also  depends on his blood sugar before eating  OVERALL average blood sugars 161 with standard deviation 65  Blood Glucose readings:   Mean values apply above for all meters except median for One Touch  PRE-MEAL Fasting Lunch Dinner Bedtime  overnight   Glucose range:  56-297   76-221     61-332   Mean/median:  180   129     214    POST-MEAL PC Breakfast PC Lunch PC Dinner  Glucose range:  66-271   52-211   89-263   Mean/median:  132   123   181     Hypoglycemia awareness: Usually develops symptoms when blood glucose is less than 50, usually sweating.   Meals: Inconsistent schedule, Inconsistent quantity. Breakfast is at 6-7 am Supper 6-7 pm  oatmeal in ams; 1/2 sandwich hs;  no night snack Carbohydrate intake: at snacks: some more chicken and [place], less meat  Physical activity: exercise: minimal Dietician visit: Most recent:, 4/09.   Wt Readings from Last 3 Encounters:  05/25/15 155 lb 9.6 oz (70.58 kg)  02/20/15 152 lb 12.8 oz (69.31 kg)  11/14/14 154 lb (69.854 kg)    Lab Results  Component Value Date   HGBA1C 7.6* 05/20/2015   HGBA1C 7.3* 02/17/2015   HGBA1C 7.5* 11/14/2014   Lab Results  Component Value Date   MICROALBUR 2.5* 02/17/2015   LDLCALC 75 02/17/2015   CREATININE 0.85 05/20/2015    Complications:  retinopathy, peripheral neuropathy.  Diabetic nephropathy: None, Last microalbumin normal.       Medication List       This list is accurate as of: 05/25/15 12:36 PM.  Always use your most recent med list.               alprazolam 2 MG tablet  Commonly known as:  XANAX  Take 1 tablet twice a day as needed for anxiety.     EVZIO 0.4 MG/0.4ML Soaj  Generic drug:  Naloxone HCl     glucose blood test strip  Use to check blood sugars 7 times per day dx code E10.41     ibuprofen 200 MG tablet  Commonly known as:  ADVIL,MOTRIN  Take 200 mg by mouth every 6 (six) hours as needed.     Insulin Glargine 100 UNIT/ML Solostar Pen  Commonly known as:   LANTUS  Inject 36 units in am and 34 units in pm     insulin lispro 100 UNIT/ML KiwkPen  Commonly known as:  HUMALOG  6-8 units 4 times per day     onetouch ultrasoft lancets  Use as instructed to check blood sugars 7 times per day     oxycodone 30 MG immediate release tablet  Commonly known as:  ROXICODONE     PEN NEEDLES 31GX5/16" 31G X 8 MM Misc  Use to check blood sugar 7 times per day     ramipril 5 MG capsule  Commonly known as:  ALTACE  Take 1 capsule (5 mg total) by mouth daily.     triamcinolone cream 0.1 %  Commonly known as:  KENALOG        Allergies:  Allergies  Allergen Reactions  . Pravastatin Sodium     myalgia    Past Medical History  Diagnosis Date  . Low back pain   . Tobacco abuse   . Diabetes 1.5, managed as type 1 (HCC)   . Hypertension   . Claudication (HCC)     No past surgical history on file.  Family History  Problem Relation Age of Onset  . Diabetes Mother   . Diabetes Maternal Uncle   . Diabetes Maternal Grandmother   . Cancer Maternal Grandfather   . Diabetes Maternal Grandfather   . Heart Problems Mother     enlarged heart  . Heart attack Father   . Stroke Father   . Heart attack Sister     at age 92  . Heart attack Maternal Grandmother     Social History:  reports that he has been smoking Cigarettes.  He has been smoking about 0.33 packs per day. He does not have any smokeless tobacco history on file. His alcohol and drug histories are not on file.  Review of Systems   HYPERTENSION: Blood pressure is well-controlled He also monitors periodically at home  Blood pressure is fairly well controlled now, ramipril was reduced to 5 mg previously because of low-normal readings especially at home    Lipids: These are normal without medications  Lab Results  Component Value Date   CHOL 130 02/17/2015   HDL 47.00 02/17/2015   LDLCALC 75 02/17/2015   TRIG 38.0 02/17/2015   CHOLHDL 3 02/17/2015    HYPONATREMIA:  Long-standing and asymptomatic. 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   Demeclocycline was prescribed previously but  not covered by insurance.  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    Assesment/PLAN:  1. Diabetes type 1, uncontrolled    His blood sugars are overall are still fluctuating See history of present illness for detailed discussion of current blood sugar patterns, problems identified and current management Most likely he is not getting a true basal effect of Lantus insulin and it may be having relatively earlier peak; currently taking it around breakfast and suppertime His highest blood sugars are OVERNIGHT and not clear why even though he is not eating much late at night Also early morning readings  are relatively high even though at times they are better and not consistent; he is frequently checking blood sugars during the night for fear of hypoglycemia  A1C is reasonably good at 7.6 HYPOGLYCEMIA has been relatively minimal  Recommendations made today:  Try taking Lantus right before breakfast in the morning and also late at night at bedtime to see if the action profile works better for him  Consider using Toujeo again if blood sugars are inconsistent overnight  Continue same amount of Humalog, avoid exposing this to extreme temperatures when eating out.  May consider increasing bedtime Lantus of fasting readings continue to be high  Again avoid taking any correction doses of Humalog during the night  Continue unit butter crackers snack at about 9 PM    2.  HYPONATREMIA: He may have some degree of SIADH from taking chronic narcotic pain medications.  Usually has better sodium levels when he does restrict his fluids and recently sodium is about the best level at 133   3. Hypertension: He has good control with 5 mg ramipril  4.  Smoking cessation: [name] not able to cut back much and recommended that he try the nicotine patch at the lowest dose   Counseling time on subjects discussed above is over 50% of today's 25 minute visit  Influenza vaccine given  Patient Instructions  LANTUS WITH BREAKFAST and at BEDTIME       Lanette Ell 05/25/2015, 12:36 PM

## 2015-05-25 NOTE — Patient Instructions (Signed)
LANTUS WITH BREAKFAST and at BEDTIME

## 2015-05-27 ENCOUNTER — Other Ambulatory Visit: Payer: Self-pay | Admitting: Endocrinology

## 2015-07-13 ENCOUNTER — Telehealth: Payer: Self-pay | Admitting: Endocrinology

## 2015-07-13 ENCOUNTER — Other Ambulatory Visit: Payer: Self-pay | Admitting: *Deleted

## 2015-07-13 MED ORDER — GLUCOSE BLOOD VI STRP: ORAL_STRIP | Status: DC

## 2015-07-13 NOTE — Telephone Encounter (Signed)
sent 

## 2015-07-13 NOTE — Telephone Encounter (Signed)
Patient need a refill of hisTest strips, one touch ultra meter

## 2015-07-14 ENCOUNTER — Other Ambulatory Visit: Payer: Self-pay | Admitting: Endocrinology

## 2015-07-14 NOTE — Telephone Encounter (Signed)
Pt is in need of a refill for test strips [facility]

## 2015-07-14 NOTE — Telephone Encounter (Signed)
Pt also needs lantus solostar 2 boxes and 200 test strips

## 2015-07-23 ENCOUNTER — Other Ambulatory Visit: Payer: Self-pay | Admitting: *Deleted

## 2015-07-23 ENCOUNTER — Telehealth: Payer: Self-pay | Admitting: Endocrinology

## 2015-07-23 MED ORDER — ALPRAZOLAM 2 MG PO TABS: ORAL_TABLET | ORAL | Status: DC

## 2015-07-23 NOTE — Telephone Encounter (Signed)
Patient called stating that he would like a refill on his Rx   Rx: Xanax   Pharmacy: [facility]   Thank you

## 2015-07-23 NOTE — Telephone Encounter (Signed)
Noted, rx sent, patient is aware that he will not be able to get another refill.  He was given the number to [facility] to be scheduled with a PCP.

## 2015-07-23 NOTE — Telephone Encounter (Signed)
He can have Xanax 2 mg twice a day as needed, if 2 tablets.  [facility]

## 2015-07-23 NOTE — Telephone Encounter (Signed)
Please see below, he said he has not found a pcp yet and would like for you to refill this. Please advise.

## 2015-08-20 ENCOUNTER — Other Ambulatory Visit (INDEPENDENT_AMBULATORY_CARE_PROVIDER_SITE_OTHER): Payer: Medicare Other

## 2015-08-20 DIAGNOSIS — E1065 Type 1 diabetes mellitus with hyperglycemia: Secondary | ICD-10-CM

## 2015-08-20 LAB — BASIC METABOLIC PANEL
BUN: 7 mg/dL (ref 6–23)
CHLORIDE: 94 meq/L — AB (ref 96–112)
CO2: 35 meq/L — AB (ref 19–32)
CREATININE: 0.83 mg/dL (ref 0.40–1.50)
Calcium: 9.2 mg/dL (ref 8.4–10.5)
GFR: 101.86 mL/min (ref >60.00)
GLUCOSE: 144 mg/dL — AB (ref 70–99)
POTASSIUM: 4.5 meq/L (ref 3.5–5.1)
Sodium: 130 mEq/L — ABNORMAL LOW (ref 135–145)

## 2015-08-20 LAB — HEMOGLOBIN A1C: Hgb A1c MFr Bld: 7.2 % — ABNORMAL HIGH (ref 4.6–6.5)

## 2015-08-25 ENCOUNTER — Ambulatory Visit (INDEPENDENT_AMBULATORY_CARE_PROVIDER_SITE_OTHER): Payer: Medicare Other | Admitting: Endocrinology

## 2015-08-25 ENCOUNTER — Other Ambulatory Visit: Payer: Self-pay | Admitting: *Deleted

## 2015-08-25 ENCOUNTER — Encounter: Payer: Self-pay | Admitting: Endocrinology

## 2015-08-25 VITALS — BP 142/80 | HR 58 | Temp 98.3°F | Resp 16 | Ht 71.0 in | Wt 153.8 lb

## 2015-08-25 DIAGNOSIS — E1065 Type 1 diabetes mellitus with hyperglycemia: Secondary | ICD-10-CM | POA: Diagnosis not present

## 2015-08-25 DIAGNOSIS — I10 Essential (primary) hypertension: Secondary | ICD-10-CM | POA: Diagnosis not present

## 2015-08-25 DIAGNOSIS — E871 Hypo-osmolality and hyponatremia: Secondary | ICD-10-CM

## 2015-08-25 MED ORDER — INSULIN ASPART 100 UNIT/ML FLEXPEN: PEN_INJECTOR | SUBCUTANEOUS | Status: DC

## 2015-08-25 NOTE — Progress Notes (Signed)
Patient ID: Peter Mendez., male   DOB: May 04, 1961, 55 y.o.   MRN: [id number]   Reason for Appointment: Diabetes follow-up   History of Present Illness   Diagnosis: Type 1 diabetes mellitus, diagnosis 1991.  He has been on basal bolus insulin regimen for a few years His A1c previously was consistently around 7-7.5 He tends to have significant fluctuation in blood sugars at all times. He has difficulty keeping his postprandial blood sugars consistent with mealtime insulin coverage and estimating the amount of insulin needed for various kinds of foods since he does not count carbohydrates A1c has been higher since 2014  Recent history:   The insulin regimen is premeal Humalog 4-6 usually,  Lantus 36 in 7 a.m. and 34 at 7 p.m.   A1c is slightly better at 7.2 Overall A1c has improved from previous levels of around 8% [name] still very compliant with checking his blood sugars on average about 7 times a day including  during the night   Current problems and blood sugar patterns  His fasting blood sugars are again and not clear if he is taking extra snacks during the night when his blood sugars are near normal  [name] usually keeping his Lantus dose consistent despite variability in his morning readings  He checks blood sugars overnight frequently and these are quite variable, has had low sugars only one time overnight  HYPOGLYCEMIA has occurred sporadically in the afternoon last week but usually not otherwise  [name] trying to take his Humalog 5-10 minutes before eating and usually trying to judge the dose based on what he is eating  LOWEST blood sugars are before supper without hypoglycemia around 5-6 PM  Usually is not having excessive hyperglycemia after supper which also depends on his blood sugar before eating  OVERALL average blood sugars are slightly lower than before with slightly less variability  Blood Glucose readings: From monitor download as below  Mean values  apply above for all meters except median for One Touch  PRE-MEAL Fasting Lunch Dinner Bedtime Overall  Glucose range:  108-299     133-244    Mean/median: 157 137   151 +/-58    POST-MEAL PC Breakfast PC Lunch PC Dinner  Glucose range:   40-235    Mean/median:  149 139    Hypoglycemia awareness: Usually develops symptoms when blood glucose is less than 50, usually sweating.   Meals: Inconsistent schedule, Inconsistent quantity. Breakfast is at 6-7 am Supper 6-7 pm  oatmeal in ams; 1/2 sandwich or peanut butter crackers at bedtime  Physical activity: exercise: minimal Dietician visit: Most recent:, 4/09.   Wt Readings from Last 3 Encounters:  08/25/15 153 lb 12.8 oz (69.763 kg)  05/25/15 155 lb 9.6 oz (70.58 kg)  02/20/15 152 lb 12.8 oz (69.31 kg)    Lab Results  Component Value Date   HGBA1C 7.2* 08/20/2015   HGBA1C 7.6* 05/20/2015   HGBA1C 7.3* 02/17/2015   Lab Results  Component Value Date   MICROALBUR 2.5* 02/17/2015   LDLCALC 75 02/17/2015   CREATININE 0.83 08/20/2015    Complications: retinopathy, peripheral neuropathy.  Diabetic nephropathy: None, Last microalbumin normal.       Medication List       This list is accurate as of: 08/25/15 12:28 PM.  Always use your most recent med list.               alprazolam 2 MG tablet  Commonly known as:  XANAX  Take 1 tablet twice a day as needed for anxiety.     EVZIO 0.4 MG/0.4ML Soaj  Generic drug:  Naloxone HCl     glucose blood test strip  Use to check blood sugars 7 times per day dx code E10.41     ibuprofen 200 MG tablet  Commonly known as:  ADVIL,MOTRIN  Take 200 mg by mouth every 6 (six) hours as needed.     insulin aspart 100 UNIT/ML FlexPen  Commonly known as:  NOVOLOG FLEXPEN  Inject 6-8 units 4 times per day     LANTUS SOLOSTAR 100 UNIT/ML Solostar Pen  Generic drug:  Insulin Glargine  INJECT 36 UNITS SUBCUTANEOUSLY IN MORNING AND 34 UNITS IN EVENING     onetouch ultrasoft lancets  Use  as instructed to check blood sugars 7 times per day     oxycodone 30 MG immediate release tablet  Commonly known as:  ROXICODONE     PEN NEEDLES 31GX5/16" 31G X 8 MM Misc  Use to check blood sugar 7 times per day     ramipril 5 MG capsule  Commonly known as:  ALTACE  Take 1 capsule (5 mg total) by mouth daily.     triamcinolone cream 0.1 %  Commonly known as:  KENALOG        Allergies:  Allergies  Allergen Reactions  . Pravastatin Sodium     myalgia    Past Medical History  Diagnosis Date  . Low back pain   . Tobacco abuse   . Diabetes 1.5, managed as type 1 (HCC)   . Hypertension   . Claudication (HCC)     No past surgical history on file.  Family History  Problem Relation Age of Onset  . Diabetes Mother   . Diabetes Maternal Uncle   . Diabetes Maternal Grandmother   . Cancer Maternal Grandfather   . Diabetes Maternal Grandfather   . Heart Problems Mother     enlarged heart  . Heart attack Father   . Stroke Father   . Heart attack Sister     at age 64  . Heart attack Maternal Grandmother     Social History:  reports that he has been smoking Cigarettes.  He has been smoking about 0.33 packs per day. He does not have any smokeless tobacco history on file. His alcohol and drug histories are not on file.  Review of Systems   HYPERTENSION: Blood pressure is well-controlled He also monitors periodically at home  Home BP 126/76 His ramipril was reduced to 5 mg previously    Lipids: These are normal without medications  Lab Results  Component Value Date   CHOL 130 02/17/2015   HDL 47.00 02/17/2015   LDLCALC 75 02/17/2015   TRIG 38.0 02/17/2015   CHOLHDL 3 02/17/2015    HYPONATREMIA: Long-standing and asymptomatic. 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   Demeclocycline was prescribed previously but  not covered by insurance.  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    Assesment/PLAN:  Diabetes type 1 with fair control  See history of present illness for detailed discussion of current blood sugar patterns, problems identified and current management  He has fairly good with A1c at 7.2 despite some variability in his blood sugars His blood sugars are much more variable overnight and in the morning hours Some of his high readings in the morning hours may be related to his getting extra for snacks because of fear of hypoglycemia or not covering his snacks. Although he had previously done better with Toujeo and required much less insulin he was afraid of hypoglycemia with this  His was prandial readings are generally well controlled after lunch and supper with variable after breakfast  HYPOGLYCEMIA has been relatively infrequent, mostly in the afternoon  Recommendations made today:  Trial of Tresiba U-200 when he finishes Lantus, discussed how this is different  and he can start with 50 units compared to a total of 70 units with his Lantus  Given him a flow chart to adjust the dose every 3 days based on fasting blood sugar patterns and keep fasting reading in the 90-140 range  Avoid excessive snacks during the night May need to reduce lunchtime dose if getting consistently low in the afternoon He can also switched back to Novolog is apparently not covered by his insurance instead of Humalog   2.  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  Usually has better sodium levels when he does restrict his fluids and recently sodium is slightly lower at 130 Advised him to go back to avoiding excess fluids  3. Hypertension: He has good control with 5 mg ramipril    Counseling time on subjects discussed above is over 50% of today's 25 minute visit     Patient Instructions  When Lantus over change to Tresiba 50 units once daily in am and adjust every 3-4 days based on am sugars  Limit water     Manda Holstad 08/25/2015, 12:28 PM

## 2015-08-25 NOTE — Patient Instructions (Signed)
When Lantus over change to [place] 50 units once daily in am and adjust every 3-4 days based on am sugars  Limit water

## 2015-09-18 ENCOUNTER — Telehealth: Payer: Self-pay | Admitting: Endocrinology

## 2015-09-18 NOTE — Telephone Encounter (Signed)
Please call in the alprazalam into [facility]

## 2015-09-18 NOTE — Telephone Encounter (Signed)
Pt called back told him Dr. Lucianne Muss would like him to get his Alprazalam from his PCP. Pt verbalized understanding.

## 2015-09-18 NOTE — Telephone Encounter (Signed)
Would like him to get this from his PCP

## 2015-09-18 NOTE — Telephone Encounter (Signed)
Please advise if ok to refill pt's alprazalam. Thank you.

## 2015-09-18 NOTE — Telephone Encounter (Signed)
Left message with wife to call office

## 2015-10-23 ENCOUNTER — Other Ambulatory Visit: Payer: Self-pay | Admitting: Endocrinology

## 2015-10-23 ENCOUNTER — Telehealth: Payer: Self-pay | Admitting: Endocrinology

## 2015-10-23 ENCOUNTER — Encounter: Payer: Self-pay | Admitting: Endocrinology

## 2015-10-23 ENCOUNTER — Ambulatory Visit (INDEPENDENT_AMBULATORY_CARE_PROVIDER_SITE_OTHER): Payer: Medicare Other | Admitting: Endocrinology

## 2015-10-23 VITALS — BP 104/60 | HR 60 | Temp 97.9°F | Resp 14 | Ht 71.0 in | Wt 159.0 lb

## 2015-10-23 DIAGNOSIS — E871 Hypo-osmolality and hyponatremia: Secondary | ICD-10-CM

## 2015-10-23 DIAGNOSIS — E1049 Type 1 diabetes mellitus with other diabetic neurological complication: Secondary | ICD-10-CM

## 2015-10-23 DIAGNOSIS — I10 Essential (primary) hypertension: Secondary | ICD-10-CM

## 2015-10-23 LAB — POCT GLYCOSYLATED HEMOGLOBIN (HGB A1C): Hemoglobin A1C: 7.5

## 2015-10-23 MED ORDER — INSULIN ASPART 100 UNIT/ML FLEXPEN: PEN_INJECTOR | SUBCUTANEOUS | Status: DC

## 2015-10-23 MED ORDER — INSULIN DEGLUDEC 100 UNIT/ML ~~LOC~~ SOPN: 54.0000 [IU] | PEN_INJECTOR | Freq: Every day | SUBCUTANEOUS | Status: DC

## 2015-10-23 NOTE — Patient Instructions (Addendum)
Reduce Ramipril to 5mg  daily  Tresiba once daily, 54 units and adjust 6 units up or down every 5 days if am sugars staying below 100 or over 200

## 2015-10-23 NOTE — Telephone Encounter (Signed)
Prescription had to be printed out and signed before it could be sent, he was never told he could go straight from his appointment to his pharmacy. Dr. Lucianne MussKumar was with patients!

## 2015-10-23 NOTE — Telephone Encounter (Signed)
Pt calling again for the alprazolam

## 2015-10-23 NOTE — Progress Notes (Signed)
Patient ID: Peter Mendez., male   DOB: 1961/05/22, 55 y.o.   MRN: [id number]   Reason for Appointment: Diabetes follow-up   History of Present Illness   Diagnosis: Type 1 diabetes mellitus, diagnosis 1991.  He has been on basal bolus insulin regimen for a few years His A1c previously was consistently around 7-7.5 He tends to have significant fluctuation in blood sugars at all times. He has difficulty keeping his postprandial blood sugars consistent with mealtime insulin coverage and estimating the amount of insulin needed for various kinds of foods since he does not count carbohydrates A1c has been higher since 2014  Recent history:   The insulin regimen is premeal Humalog 4-6 usually,  Lantus 36 in 7 a.m. and 34 at 7 p.m.   A1c is slightly higher at 7.5 Overall A1c has has been mostly under 8% now [name] still very compliant with checking his blood sugars on average about 7 times a day including  during the night   Current problems and blood sugar patterns  His fasting blood sugars are variable but mostly increased  He has checked his blood sugars overnight significantly but these are periodically high even before 3 AM despite not eating late night snacks.  However he may still take extra snacks during the night for fear of hypoglycemia  [name] having more variability in his blood sugars in the afternoon AFTER lunch but he does not know why, recently these are better from the highest level of 344  Also more recently blood sugars before and after supper are better without any change in his morning Lantus dose  Hypoglycemia is occurring only this week before lunch or before supper on 2 separate occasions and mild, he thinks he may have been more active recently  Blood Glucose readings: From monitor download as below  Mean values apply above for all meters except median for One Touch  PRE-MEAL Fasting Lunch Dinner Bedtime Overall  Glucose range: 89-283  56-389        Mean/median: 165 147 162 162 169+/-89     Overnight blood sugar range 126-260, median 185  Hypoglycemia awareness: Usually develops symptoms when blood glucose is less than 50, usually sweating.   Meals: Inconsistent schedule, Inconsistent quantity. Breakfast is at 6-7 am Supper 6-7 pm  oatmeal in ams; 1/2 sandwich or peanut butter crackers at bedtime  Physical activity: exercise: Variable  Dietician visit: Most recent:, 4/09.   Wt Readings from Last 3 Encounters:  10/23/15 159 lb (72.122 kg)  08/25/15 153 lb 12.8 oz (69.763 kg)  05/25/15 155 lb 9.6 oz (70.58 kg)    Lab Results  Component Value Date   HGBA1C 7.5 10/23/2015   HGBA1C 7.2* 08/20/2015   HGBA1C 7.6* 05/20/2015   Lab Results  Component Value Date   MICROALBUR 2.5* 02/17/2015   LDLCALC 75 02/17/2015   CREATININE 0.83 08/20/2015    Complications: retinopathy, peripheral neuropathy.  Diabetic nephropathy: None, Last microalbumin normal.       Medication List       This list is accurate as of: 10/23/15 11:59 PM.  Always use your most recent med list.               alprazolam 2 MG tablet  Commonly known as:  XANAX  take 1 tablet by mouth twice a day if needed for anxiety     EVZIO 0.4 MG/0.4ML Soaj  Generic drug:  Naloxone HCl     glucose blood test  strip  Use to check blood sugars 7 times per day dx code E10.41     ibuprofen 200 MG tablet  Commonly known as:  ADVIL,MOTRIN  Take 200 mg by mouth every 6 (six) hours as needed.     insulin aspart 100 UNIT/ML FlexPen  Commonly known as:  NOVOLOG FLEXPEN  Inject 6-8 units 4 times per day     Insulin Degludec 100 UNIT/ML Sopn  Commonly known as:  TRESIBA FLEXTOUCH  Inject 54 Units into the skin daily.     onetouch ultrasoft lancets  Use as instructed to check blood sugars 7 times per day     oxycodone 30 MG immediate release tablet  Commonly known as:  ROXICODONE     PEN NEEDLES 31GX5/16" 31G X 8 MM Misc  Use to check blood sugar 7 times per  day     ramipril 5 MG capsule  Commonly known as:  ALTACE  Take 1 capsule (5 mg total) by mouth daily.     triamcinolone cream 0.1 %  Commonly known as:  KENALOG        Allergies:  Allergies  Allergen Reactions  . Pravastatin Sodium     myalgia    Past Medical History  Diagnosis Date  . Low back pain   . Tobacco abuse   . Diabetes 1.5, managed as type 1 (HCC)   . Hypertension   . Claudication (HCC)     No past surgical history on file.  Family History  Problem Relation Age of Onset  . Diabetes Mother   . Diabetes Maternal Uncle   . Diabetes Maternal Grandmother   . Cancer Maternal Grandfather   . Diabetes Maternal Grandfather   . Heart Problems Mother     enlarged heart  . Heart attack Father   . Stroke Father   . Heart attack Sister     at age 56  . Heart attack Maternal Grandmother     Social History:  reports that he has been smoking Cigarettes.  He has been smoking about 0.33 packs per day. He does not have any smokeless tobacco history on file. His alcohol and drug histories are not on file.  Review of Systems   HYPERTENSION: Blood pressure is Relatively low He has been told to take 5 mg ramipril but he thinks he is getting 10 mg by mistake  Home BP 126/76 but has not brought his monitor for comparative measurement    Lipids: These are normal without medications  Lab Results  Component Value Date   CHOL 130 02/17/2015   HDL 47.00 02/17/2015   LDLCALC 75 02/17/2015   TRIG 38.0 02/17/2015   CHOLHDL 3 02/17/2015    HYPONATREMIA: Long-standing and asymptomatic. 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   Demeclocycline was prescribed previously but  not covered by insurance.  Cortrosyn stimulation test was  also normal   Urinary sodium previously was 138 and U Osm 245  Fluid intake has been probably higher over the last couple of months and he has a slightly lowered sodium now at 130 Again asymptomatic   Lab Results  Component Value Date   CREATININE 0.83 08/20/2015   BUN 7 08/20/2015   NA 130* 08/20/2015   K 4.5 08/20/2015  CL 94* 08/20/2015   CO2 35* 08/20/2015    Chronic back pain, taking narcotic medications as needed  Visual loss from retinopathy, followed by ophthalmologist  He has had symptoms of neuropathy, last diabetic foot exam on 11/14/14  Anxiety: He takes Xanax regularly But is going to get prescription from his PCP from next month     Examination:   BP 104/60 mmHg  Pulse 60  Temp(Src) 97.9 F (36.6 C)  Resp 14  Ht  (1.803 m)  Wt 159 lb (72.122 kg)  BMI 22.19 kg/m2  SpO2 97%  Body mass index is 22.19 kg/(m^2).    Assesment/PLAN:  Diabetes type 1 with fair control  See history of present illness for detailed discussion of current blood sugar patterns, problems identified and current management  He has fairly good with A1c at 7.5 despite most of his readings being on an average high He has significant variability in his blood sugar especially in the afternoons and overnight  Again discussed that the need to try another basal insulin which will show less variability He still has not requested a prescription for Evaristo Bury that was discussed previously Again discussed benefits of the newer insulins which have been compared with Lantus Although he had previously done better with Toujeo and required much less insulin he was afraid of hypoglycemia with this and does not want to try to gain  His was prandial readings are generally well controlled   HYPOGLYCEMIA has been relatively infrequent, causative factors unclear   Recommendations made today:  Trial of Tresiba U-200 when he finishes Lantus, discussed how this is different and he can start with 54  units.  Given him guidelines on how to adjust this every 3-4 days by 6 units based on fasting blood sugar trend  No change in Novolog as yet  Avoid excessive snacks during the night    2.  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  Usually has better sodium levels when he does restrict his fluids, discussed continuing avoiding excess fluids   3. Hypertension: He has lower blood pressure today and discussed that he needs to go back to 5 mg ramipril instead of 10 mg that he is taking on his own    Counseling time on subjects discussed above is over 50% of today's 25 minute visit     Patient Instructions  Reduce Ramipril to  daily  Tresiba once daily, 54 units and adjust 6 units up or down every 5 days if am sugars staying below 100 or over 200     Allea Kassner 10/25/2015, 9:45 PM

## 2015-10-23 NOTE — Telephone Encounter (Signed)
Pt called and said that he is at the pharmacy and that one of his prescriptions is there and that he needs his Alprazolam 2mg  called in also

## 2015-11-16 ENCOUNTER — Other Ambulatory Visit (INDEPENDENT_AMBULATORY_CARE_PROVIDER_SITE_OTHER): Payer: Medicare Other

## 2015-11-16 DIAGNOSIS — E1065 Type 1 diabetes mellitus with hyperglycemia: Secondary | ICD-10-CM

## 2015-11-16 LAB — COMPREHENSIVE METABOLIC PANEL
ALBUMIN: 4 g/dL (ref 3.5–5.2)
ALK PHOS: 62 U/L (ref 39–117)
ALT: 12 U/L (ref 0–53)
AST: 22 U/L (ref 0–37)
BILIRUBIN TOTAL: 0.6 mg/dL (ref 0.2–1.2)
BUN: 9 mg/dL (ref 6–23)
CO2: 30 mEq/L (ref 19–32)
Calcium: 9.2 mg/dL (ref 8.4–10.5)
Chloride: 94 mEq/L — ABNORMAL LOW (ref 96–112)
Creatinine, Ser: 0.72 mg/dL (ref 0.40–1.50)
GFR: 119.92 mL/min (ref >60.00)
GLUCOSE: 111 mg/dL — AB (ref 70–99)
POTASSIUM: 4.1 meq/L (ref 3.5–5.1)
Sodium: 130 mEq/L — ABNORMAL LOW (ref 135–145)
TOTAL PROTEIN: 7.1 g/dL (ref 6.0–8.3)

## 2015-11-19 ENCOUNTER — Other Ambulatory Visit: Payer: Medicare Other

## 2015-11-19 ENCOUNTER — Ambulatory Visit (INDEPENDENT_AMBULATORY_CARE_PROVIDER_SITE_OTHER): Payer: Medicare Other | Admitting: Endocrinology

## 2015-11-19 ENCOUNTER — Encounter: Payer: Self-pay | Admitting: Endocrinology

## 2015-11-19 VITALS — BP 124/60 | HR 61 | Temp 97.5°F | Resp 14 | Ht 71.0 in | Wt 151.4 lb

## 2015-11-19 DIAGNOSIS — E871 Hypo-osmolality and hyponatremia: Secondary | ICD-10-CM

## 2015-11-19 DIAGNOSIS — I70209 Unspecified atherosclerosis of native arteries of extremities, unspecified extremity: Secondary | ICD-10-CM | POA: Diagnosis not present

## 2015-11-19 DIAGNOSIS — E1065 Type 1 diabetes mellitus with hyperglycemia: Secondary | ICD-10-CM | POA: Diagnosis not present

## 2015-11-19 DIAGNOSIS — E1040 Type 1 diabetes mellitus with diabetic neuropathy, unspecified: Secondary | ICD-10-CM | POA: Diagnosis not present

## 2015-11-19 NOTE — Progress Notes (Signed)
Patient ID: Peter Mendez., male   DOB: [birth year], 55 y.o.   MRN: [id number]   Reason for Appointment: Diabetes follow-up   History of Present Illness   Diagnosis: Type 1 diabetes mellitus, diagnosis 1991.  He has been on basal bolus insulin regimen for a few years His A1c previously was consistently around 7-7.5 He tends to have significant fluctuation in blood sugars at all times. He has difficulty keeping his postprandial blood sugars consistent with mealtime insulin coverage and estimating the amount of insulin needed for various kinds of foods since he does not count carbohydrates A1c has been higher since 2014  Recent history:   The insulin regimen is TRESIBA 50 units daily, premeal Novolog 4-6 usually  He was switched from twice a day Lantus to [place] and he thinks he started this about 2 weeks ago A1c is slightly higher at 7.5 as of 3/17  [name] still very compliant with checking his blood sugars on average about 7 times a day including  during the night   Current problems and blood sugar patterns  His fasting blood sugars are variable but has had tendency to occasional low sugars now, has 2 low readings of 38 and 57.  He reduced his [place] by 4 units when his glucose was 38  Blood sugars are still fluctuating considerably but has not had as many extremely high readings recently  He says that his diet has not been good recently in the evenings because of eating out in has several readings over 200 after supper  He has not changed his insulin at mealtimes even when he is eating larger meals are eating out with other people and drinking sweet drinks   but mostly increased  Not clear why he has had sporadic high readings over 300 at most of the times of the day, some of these are related to rebound from low sugars are also possibly from snacks at night when he is checking his blood sugars sometimes 2 or 3 times  Hypoglycemia is occurring sporadically early  morning and at times late afternoon  Blood Glucose readings: From monitor download as below  Mean values apply above for all meters except median for One Touch  PRE-MEAL Fasting Lunch Dinner Bedtime Overall  Glucose range: 38-275  48-315  56-327  62-296    Mean/median:     180+/-82    POST-MEAL PC Breakfast PC Lunch Overnight   Glucose range:   57-422   Mean/median:       Hypoglycemia awareness: Usually develops symptoms when blood glucose is less than 50, usually sweating.   Meals: Inconsistent schedule, Inconsistent quantity. Breakfast is at 6-7 am Supper 6-7 pm  oatmeal in ams; 1/2 sandwich or peanut butter crackers at bedtime  Physical activity: exercise: Variable  Dietician visit: Most recent:, 4/09.   Wt Readings from Last 3 Encounters:  11/19/15 151 lb 6.4 oz (68.675 kg)  10/23/15 159 lb (72.122 kg)  08/25/15 153 lb 12.8 oz (69.763 kg)    Lab Results  Component Value Date   HGBA1C 7.5 10/23/2015   HGBA1C 7.2* 08/20/2015   HGBA1C 7.6* 05/20/2015   Lab Results  Component Value Date   MICROALBUR 2.5* 02/17/2015   LDLCALC 75 02/17/2015   CREATININE 0.72 11/16/2015    Complications: retinopathy, peripheral neuropathy.  Diabetic nephropathy: None, Last microalbumin normal.       Medication List       This list is accurate as of: 11/19/15 10:49 AM.  Always use your most recent med list.               alprazolam 2 MG tablet  Commonly known as:  XANAX  take 1 tablet by mouth twice a day if needed for anxiety     EVZIO 0.4 MG/0.4ML Soaj  Generic drug:  Naloxone HCl     glucose blood test strip  Use to check blood sugars 7 times per day dx code E10.41     ibuprofen 200 MG tablet  Commonly known as:  ADVIL,MOTRIN  Take 200 mg by mouth every 6 (six) hours as needed.     insulin aspart 100 UNIT/ML FlexPen  Commonly known as:  NOVOLOG FLEXPEN  Inject 6-8 units 4 times per day     Insulin Degludec 100 UNIT/ML Sopn  Commonly known as:  TRESIBA FLEXTOUCH   Inject 54 Units into the skin daily.     onetouch ultrasoft lancets  Use as instructed to check blood sugars 7 times per day     oxycodone 30 MG immediate release tablet  Commonly known as:  ROXICODONE     PEN NEEDLES 31GX5/16" 31G X 8 MM Misc  Use to check blood sugar 7 times per day     ramipril 5 MG capsule  Commonly known as:  ALTACE  Take 1 capsule (5 mg total) by mouth daily.     triamcinolone cream 0.1 %  Commonly known as:  KENALOG        Allergies:  Allergies  Allergen Reactions  . Pravastatin Sodium     myalgia    Past Medical History  Diagnosis Date  . Low back pain   . Tobacco abuse   . Diabetes 1.5, managed as type 1 (HCC)   . Hypertension   . Claudication (HCC)     No past surgical history on file.  Family History  Problem Relation Age of Onset  . Diabetes Mother   . Diabetes Maternal Uncle   . Diabetes Maternal Grandmother   . Cancer Maternal Grandfather   . Diabetes Maternal Grandfather   . Heart Problems Mother     enlarged heart  . Heart attack Father   . Stroke Father   . Heart attack Sister     at age 58  . Heart attack Maternal Grandmother     Social History:  reports that he has been smoking Cigarettes.  He has been smoking about 0.33 packs per day. He does not have any smokeless tobacco history on file. His alcohol and drug histories are not on file.  Review of Systems   HYPERTENSION: Blood pressure is Relatively low He has been told to take 5 mg ramipril but he thinks he is getting 10 mg by mistake  Home BP 126/76 but has not brought his monitor for comparative measurement    Lipids: These are normal without medications  Lab Results  Component Value Date   CHOL 130 02/17/2015   HDL 47.00 02/17/2015   LDLCALC 75 02/17/2015   TRIG 38.0 02/17/2015   CHOLHDL 3 02/17/2015    HYPONATREMIA: Long-standing and asymptomatic. 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   Demeclocycline was prescribed previously but  not covered by insurance.  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    Diabetic Foot Exam - Simple   Simple Foot Form  Diabetic Foot exam was performed with the following findings:  Yes 11/19/2015 10:28 AM  Visual Inspection  No deformities, no ulcerations, no other skin breakdown bilaterally:  Yes  Sensation Testing  Intact to touch and monofilament testing bilaterally:  Yes  Pulse Check  See comments:  Yes  Comments  Absent pulses      Assesment/PLAN:  Diabetes type 1 with fair control  See history of present illness for detailed discussion of current blood sugar patterns, problems identified and current management  He appears to have generally more stable blood sugars with using Tresiba instead of Lantus Overall even with reducing his dose from a total of 70 units of Lantus down to 50 of the [place]iba he is still getting relatively low sugars in the mornings in the last couple of days Most of his  high readings lately have been prominent consistent diet and inadequate mealtime coverage especially in the evenings Discussed needing to cover higher fat meals with extra insulin Also can sometimes take insulin right after eating if he is not sure how much he will eat Since he cannot reliably count carbohydrates he will still continue empirically doses of Novolog   Recommendations made today:  Reduce Tresiba to 48 units  Adjust Novolog as discussed above  A1c in 3 months    2.  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  Usually has better sodium levels when he does restrict his fluids, sodium now stable at 130  3. Hypertension: He has normal blood pressure with 5 mg ramipril  4.  Asymptomatic peripheral vascular  disease present  5.  Foot care: Emphasized checking his feet regularly.  Also can follow up with podiatrist for onychomycosis and thick toenails   Counseling time on subjects discussed above is over 50% of today's 25 minute visit     Patient Instructions  Tresiba 48 units daily  Mark after meal readings     Swayze Kozuch 11/19/2015, 10:49 AM

## 2015-11-19 NOTE — Patient Instructions (Signed)
Tresiba 48 units daily  Mark after meal readings

## 2015-12-01 ENCOUNTER — Telehealth: Payer: Self-pay | Admitting: Endocrinology

## 2015-12-01 ENCOUNTER — Other Ambulatory Visit: Payer: Self-pay | Admitting: *Deleted

## 2015-12-01 MED ORDER — ALPRAZOLAM 2 MG PO TABS: ORAL_TABLET | ORAL | Status: DC

## 2015-12-01 NOTE — Telephone Encounter (Signed)
This was filled on 10/19/15 with 1 refill, he said he mentioned to you that if he could not get into his PCP  In time that maybe you could refill this for him.  Please advise.

## 2015-12-01 NOTE — Telephone Encounter (Signed)
Refill for 30 days  

## 2015-12-01 NOTE — Telephone Encounter (Signed)
Noted, rx is on your desk for signature

## 2015-12-01 NOTE — Telephone Encounter (Signed)
Patient need a refill of medication alprazolam (XANAX) 2 MG tablet, [facility] - [place], [place] - [address] [phone] (Phone) [phone] (Fax)

## 2015-12-15 ENCOUNTER — Other Ambulatory Visit: Payer: Self-pay | Admitting: Endocrinology

## 2015-12-16 ENCOUNTER — Telehealth: Payer: Self-pay | Admitting: Endocrinology

## 2015-12-16 NOTE — Telephone Encounter (Signed)
See note below and please advise, Thanks! 

## 2015-12-16 NOTE — Telephone Encounter (Signed)
PT would like to know where he could get some diabetic shoes.

## 2015-12-16 NOTE — Telephone Encounter (Signed)
Currently does not qualify for shoes under Medicare guidelines

## 2015-12-17 NOTE — Telephone Encounter (Signed)
Noted, detailed voice mail left on patients phone. 

## 2015-12-28 ENCOUNTER — Ambulatory Visit: Payer: Medicare Other | Admitting: Internal Medicine

## 2015-12-29 ENCOUNTER — Encounter: Payer: Self-pay | Admitting: Internal Medicine

## 2015-12-29 ENCOUNTER — Ambulatory Visit (INDEPENDENT_AMBULATORY_CARE_PROVIDER_SITE_OTHER): Payer: Medicare Other | Admitting: Internal Medicine

## 2015-12-29 VITALS — BP 144/86 | HR 69 | Temp 98.8°F | Wt 152.8 lb

## 2015-12-29 DIAGNOSIS — IMO0002 Reserved for concepts with insufficient information to code with codable children: Secondary | ICD-10-CM

## 2015-12-29 DIAGNOSIS — R21 Rash and other nonspecific skin eruption: Secondary | ICD-10-CM | POA: Diagnosis not present

## 2015-12-29 DIAGNOSIS — I10 Essential (primary) hypertension: Secondary | ICD-10-CM | POA: Diagnosis not present

## 2015-12-29 DIAGNOSIS — E1065 Type 1 diabetes mellitus with hyperglycemia: Secondary | ICD-10-CM

## 2015-12-29 DIAGNOSIS — I70209 Unspecified atherosclerosis of native arteries of extremities, unspecified extremity: Secondary | ICD-10-CM

## 2015-12-29 DIAGNOSIS — M549 Dorsalgia, unspecified: Secondary | ICD-10-CM

## 2015-12-29 DIAGNOSIS — G8929 Other chronic pain: Secondary | ICD-10-CM

## 2015-12-29 DIAGNOSIS — E7800 Pure hypercholesterolemia, unspecified: Secondary | ICD-10-CM

## 2015-12-29 DIAGNOSIS — F411 Generalized anxiety disorder: Secondary | ICD-10-CM | POA: Insufficient documentation

## 2015-12-29 DIAGNOSIS — E1041 Type 1 diabetes mellitus with diabetic mononeuropathy: Secondary | ICD-10-CM | POA: Diagnosis not present

## 2015-12-29 DIAGNOSIS — F419 Anxiety disorder, unspecified: Secondary | ICD-10-CM

## 2015-12-29 MED ORDER — TRIAMCINOLONE ACETONIDE 0.1 % EX CREA: TOPICAL_CREAM | Freq: Two times a day (BID) | CUTANEOUS | Status: DC

## 2015-12-29 MED ORDER — ALPRAZOLAM 2 MG PO TABS: ORAL_TABLET | ORAL | Status: DC

## 2015-12-29 MED ORDER — BUSPIRONE HCL 10 MG PO TABS: 10.0000 mg | ORAL_TABLET | Freq: Three times a day (TID) | ORAL | Status: DC

## 2015-12-29 NOTE — Progress Notes (Signed)
Pre visit review using our clinic review tool, if applicable. No additional management support is needed unless otherwise documented below in the visit note. 

## 2015-12-29 NOTE — Assessment & Plan Note (Signed)
We discussed increased risk of mortality of patients using narcotics and benzo's Will start Buspar 10 mg TID RX for Xanax 2 mg QHS only Referral placed to psych

## 2015-12-29 NOTE — Assessment & Plan Note (Signed)
LDL at goal Encouraged him to consume a low fat diet

## 2015-12-29 NOTE — Patient Instructions (Signed)
Generalized Anxiety Disorder Generalized anxiety disorder (GAD) is a mental disorder. It interferes with life functions, including relationships, work, and school. GAD is different from normal anxiety, which everyone experiences at some point in their lives in response to specific life events and activities. Normal anxiety actually helps us prepare for and get through these life events and activities. Normal anxiety goes away after the event or activity is over.  GAD causes anxiety that is not necessarily related to specific events or activities. It also causes excess anxiety in proportion to specific events or activities. The anxiety associated with GAD is also difficult to control. GAD can vary from mild to severe. People with severe GAD can have intense waves of anxiety with physical symptoms (panic attacks).  SYMPTOMS The anxiety and worry associated with GAD are difficult to control. This anxiety and worry are related to many life events and activities and also occur more days than not for 6 months or longer. People with GAD also have three or more of the following symptoms (one or more in children):  Restlessness.   Fatigue.  Difficulty concentrating.   Irritability.  Muscle tension.  Difficulty sleeping or unsatisfying sleep. DIAGNOSIS GAD is diagnosed through an assessment by your health care provider. Your health care provider will ask you questions aboutyour mood,physical symptoms, and events in your life. Your health care provider may ask you about your medical history and use of alcohol or drugs, including prescription medicines. Your health care provider may also do a physical exam and blood tests. Certain medical conditions and the use of certain substances can cause symptoms similar to those associated with GAD. Your health care provider may refer you to a mental health specialist for further evaluation. TREATMENT The following therapies are usually used to treat GAD:    Medication. Antidepressant medication usually is prescribed for long-term daily control. Antianxiety medicines may be added in severe cases, especially when panic attacks occur.   Talk therapy (psychotherapy). Certain types of talk therapy can be helpful in treating GAD by providing support, education, and guidance. A form of talk therapy called cognitive behavioral therapy can teach you healthy ways to think about and react to daily life events and activities.  Stress managementtechniques. These include yoga, meditation, and exercise and can be very helpful when they are practiced regularly. A mental health specialist can help determine which treatment is best for you. Some people see improvement with one therapy. However, other people require a combination of therapies.   This information is not intended to replace advice given to you by your health care provider. Make sure you discuss any questions you have with your health care provider.   Document Released: 11/19/2012 Document Revised: 08/15/2014 Document Reviewed: 11/19/2012 Elsevier Interactive Patient Education 2016 [employer]  

## 2015-12-29 NOTE — Assessment & Plan Note (Signed)
Continue Ramipril 5 mg If remains borderline elevated, increase to 7.5 mg daily

## 2015-12-29 NOTE — Assessment & Plan Note (Signed)
He will continue Lantus and Humalog He will continue to follow with Dr. Lucianne MussKumar
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
## 2015-12-29 NOTE — Assessment & Plan Note (Signed)
He follows with [facility] He will continue Oxycodone as prescribed

## 2016-02-15 ENCOUNTER — Other Ambulatory Visit: Payer: Self-pay | Admitting: Endocrinology

## 2016-02-18 ENCOUNTER — Ambulatory Visit (INDEPENDENT_AMBULATORY_CARE_PROVIDER_SITE_OTHER): Payer: Medicare Other | Admitting: Endocrinology

## 2016-02-18 ENCOUNTER — Encounter: Payer: Self-pay | Admitting: Endocrinology

## 2016-02-18 VITALS — BP 128/64 | HR 65 | Ht 71.0 in | Wt 153.0 lb

## 2016-02-18 DIAGNOSIS — F419 Anxiety disorder, unspecified: Secondary | ICD-10-CM | POA: Diagnosis not present

## 2016-02-18 DIAGNOSIS — E871 Hypo-osmolality and hyponatremia: Secondary | ICD-10-CM | POA: Diagnosis not present

## 2016-02-18 DIAGNOSIS — E1065 Type 1 diabetes mellitus with hyperglycemia: Secondary | ICD-10-CM | POA: Diagnosis not present

## 2016-02-18 DIAGNOSIS — I70209 Unspecified atherosclerosis of native arteries of extremities, unspecified extremity: Secondary | ICD-10-CM | POA: Diagnosis not present

## 2016-02-18 LAB — POCT GLYCOSYLATED HEMOGLOBIN (HGB A1C): HEMOGLOBIN A1C: 7.5

## 2016-02-18 MED ORDER — ALPRAZOLAM 2 MG PO TABS: ORAL_TABLET | ORAL | Status: DC

## 2016-02-18 NOTE — Progress Notes (Signed)
Patient ID: Peter NewnessEarl T Stang Jr., male   DOB: 07/15/1961, 55 y.o.   MRN: [id number]   Reason for Appointment: Diabetes follow-up   History of Present Illness   Diagnosis: Type 1 diabetes mellitus, diagnosis 1991.  He has been on basal bolus insulin regimen for a few years His A1c previously was consistently around 7-7.5 He tends to have significant fluctuation in blood sugars at all times. He has difficulty keeping his postprandial blood sugars consistent with mealtime insulin coverage and estimating the amount of insulin needed for various kinds of foods since he does not count carbohydrates A1c has been higher since 2014  Recent history:   The insulin regimen is Lantus 36--18 with premeal Novolog 4-6 usually  He was switched from twice a day Lantus to [place] in 4/17 However his pharmacy given Lantus a couple of days ago, was previously on 48 Tresiba  A1c is slightly higher than usual at 7.5, unchanged    Current problems and blood sugar patterns  His fasting blood sugars are variable as before but overall still high  No nocturnal hypoglycemia  Has had only rare hypoglycemia sporadically during the day but none after supper are usually, lowest blood sugar was after lunch, 48  With starting Lantus his blood sugars appear to be overall higher at the current dose  Although his the highest blood sugars are after his evening meal they are not as high consistently recently  Again no blood sugar patterns seen as before and his variability may be partly related to his diet and not counting carbohydrates  [name] sometimes eating unbalanced meals at breakfast without protein but otherwise may have a protein like an egg or meet  Blood Glucose readings: From monitor download as below  Mean values apply above for all meters except median for One Touch  PRE-MEAL Fasting Lunch Dinner Bedtime Overall  Glucose range: 51-336   57-287  98-279    Mean/median: 174  186  164  209   181+/-68    Hypoglycemia awareness: Usually develops symptoms when blood glucose is less than 50, usually sweating.   Meals: Inconsistent schedule, Inconsistent quantity. Breakfast is at 6-7 am Supper 6-7 pm  Toast, milk; in ams; 1/2 sandwich or peanut butter crackers at bedtime  Physical activity: exercise: Variable  Dietician visit: Most recent:, 4/09.   Wt Readings from Last 3 Encounters:  02/18/16 153 lb (69.4 kg)  12/29/15 152 lb 12 oz (69.287 kg)  11/19/15 151 lb 6.4 oz (68.675 kg)    Lab Results  Component Value Date   HGBA1C 7.5 02/18/2016   HGBA1C 7.5 10/23/2015   HGBA1C 7.2* 08/20/2015   Lab Results  Component Value Date   MICROALBUR 2.5* 02/17/2015   LDLCALC 75 02/17/2015   CREATININE 0.72 11/16/2015    Complications: retinopathy, peripheral neuropathy.  Diabetic nephropathy: None, Last microalbumin normal.       Medication List       This list is accurate as of: 02/18/16 12:03 PM.  Always use your most recent med list.               alprazolam 2 MG tablet  Commonly known as:  XANAX  Take 1 tablet twice a day as needed for anxiety.     busPIRone 10 MG tablet  Commonly known as:  BUSPAR  Take 1 tablet (10 mg total) by mouth 3 (three) times daily.     glucose blood test strip  Use to check blood sugars 7  times per day dx code E10.41     ibuprofen 200 MG tablet  Commonly known as:  ADVIL,MOTRIN  Take 200 mg by mouth every 6 (six) hours as needed.     insulin aspart 100 UNIT/ML FlexPen  Commonly known as:  NOVOLOG FLEXPEN  Inject 6-8 units 4 times per day     LANTUS SOLOSTAR 100 UNIT/ML Solostar Pen  Generic drug:  Insulin Glargine  Inject 48 Units into the skin daily.     LANTUS SOLOSTAR 100 UNIT/ML Solostar Pen  Generic drug:  Insulin Glargine  inject 36 units subcutaneously every morning and 34 units every evening     onetouch ultrasoft lancets  Use as instructed to check blood sugars 7 times per day     oxycodone 30 MG immediate  release tablet  Commonly known as:  ROXICODONE     PEN NEEDLES 31GX5/16" 31G X 8 MM Misc  Use to check blood sugar 7 times per day     ramipril 5 MG capsule  Commonly known as:  ALTACE  Take 1 capsule (5 mg total) by mouth daily.     triamcinolone cream 0.1 %  Commonly known as:  KENALOG  Apply topically 2 (two) times daily.        Allergies:  Allergies  Allergen Reactions  . Pravastatin Sodium     myalgia    Past Medical History  Diagnosis Date  . Low back pain   . Tobacco abuse   . Diabetes 1.5, managed as type 1 (HCC)   . Hypertension   . Claudication (HCC)   . Chicken pox     Past Surgical History  Procedure Laterality Date  . Retinal detachment surgery Bilateral   . Cataract extraction Bilateral   . Intraocular lens insertion Bilateral     Family History  Problem Relation Age of Onset  . Diabetes Mother   . Atrial fibrillation Mother   . Diabetes Maternal Uncle   . Diabetes Maternal Grandmother   . Heart attack Maternal Grandmother   . Esophageal cancer Maternal Grandfather   . Heart attack Father   . Stroke Father   . Arthritis Father   . Heart attack Sister     at age 52    Social History:  reports that he has been smoking Cigarettes.  He has been smoking about 0.50 packs per day. He has never used smokeless tobacco. He reports that he does not drink alcohol. His drug history is not on file.  Review of Systems   HYPERTENSION: Blood pressure is Relatively low He has been told to take 5 mg ramipril but he thinks he is getting 10 mg by mistake  Home BP 126/76 but has not brought his monitor for comparative measurement    Lipids: These are normal without medications  Lab Results  Component Value Date   CHOL 130 02/17/2015   HDL 47.00 02/17/2015   LDLCALC 75 02/17/2015   TRIG 38.0 02/17/2015   CHOLHDL 3 02/17/2015    HYPONATREMIA: Long-standing and asymptomatic. 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   Demeclocycline was prescribed previously but  not covered by insurance.  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    Assesment/PLAN:  Diabetes type 1, long-standing with fair control  See history of present illness for detailed discussion of current blood sugar patterns, problems identified and current management  Although he had somewhat better and more stable sugars with Evaristo Bury his A1c is about the same [name] checking his blood sugars up to 9 times a day including overnight More recently his pharmacy given Lantus instead of Tresiba Most likely will do better with [place] and he can switch back, will need to contact his pharmacy  Discussed that he most likely needs usually 1-2 units more for his evening meal since highest readings are after supper Also he needs to make sure he has some protein with breakfast Since he cannot reliably count carbohydrates he will still continue empirically doses of Novolog When he goes back to [place] taking try 48 units  again For now will need to increase his Lantus by 2 units twice a day since overall blood sugars are higher    2.  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  Usually has better sodium levels when he does restrict his fluids, sodium to be rechecked today  3. Hypertension: He has normal blood pressure with 5 mg ramipril  4.  Chronic anxiety, will give him a prescription for Xanax 2 mg twice a day and he will try to establish with a PCP that he likes     Counseling time on subjects discussed above is over 50% of today's 25 minute visit     Patient Instructions  Lantus 20 in pm, 38 in am     [facility] 02/18/2016, 12:03 PM

## 2016-02-18 NOTE — Patient Instructions (Signed)
Lantus 20 in pm, 38 in am

## 2016-02-19 LAB — LIPID PANEL
CHOL/HDL RATIO: 4
CHOLESTEROL: 166 mg/dL (ref 0–200)
HDL: 45.7 mg/dL (ref >39.00)
LDL Cholesterol: 109 mg/dL — ABNORMAL HIGH (ref 0–99)
NonHDL: 120.21
Triglycerides: 54 mg/dL (ref 0.0–149.0)
VLDL: 10.8 mg/dL (ref 0.0–40.0)

## 2016-02-19 LAB — BASIC METABOLIC PANEL
BUN: 8 mg/dL (ref 6–23)
CHLORIDE: 94 meq/L — AB (ref 96–112)
CO2: 31 meq/L (ref 19–32)
CREATININE: 0.94 mg/dL (ref 0.40–1.50)
Calcium: 9.2 mg/dL (ref 8.4–10.5)
GFR: 88.07 mL/min (ref >60.00)
Glucose, Bld: 247 mg/dL — ABNORMAL HIGH (ref 70–99)
POTASSIUM: 5.2 meq/L — AB (ref 3.5–5.1)
Sodium: 130 mEq/L — ABNORMAL LOW (ref 135–145)

## 2016-02-19 LAB — MICROALBUMIN / CREATININE URINE RATIO
Creatinine,U: 204.5 mg/dL
MICROALB UR: 8.3 mg/dL — AB (ref 0.0–1.9)
MICROALB/CREAT RATIO: 4.1 mg/g (ref 0.0–30.0)

## 2016-02-22 ENCOUNTER — Telehealth: Payer: Self-pay

## 2016-02-22 MED ORDER — VALSARTAN 80 MG PO TABS: 80.0000 mg | ORAL_TABLET | Freq: Every day | ORAL | Status: DC

## 2016-02-22 NOTE — Telephone Encounter (Signed)
Called and advised patient of the medication change. Discontinued the Rampril, and started patient on Valsartan; Patient had no questions, I advised which pharmacy it was sent too and he stated he would go pick it up.

## 2016-03-01 ENCOUNTER — Ambulatory Visit (HOSPITAL_COMMUNITY): Payer: Medicare Other | Admitting: Psychiatry

## 2016-04-20 ENCOUNTER — Telehealth: Payer: Self-pay | Admitting: Endocrinology

## 2016-04-20 ENCOUNTER — Other Ambulatory Visit: Payer: Self-pay | Admitting: *Deleted

## 2016-04-20 MED ORDER — ALPRAZOLAM 2 MG PO TABS: ORAL_TABLET | ORAL | 0 refills | Status: DC

## 2016-04-20 NOTE — Telephone Encounter (Signed)
Dr. Lucianne MussKumar was only filling this on a temporary basis until he got a PCP. He needs to call them and ask for it.

## 2016-04-20 NOTE — Telephone Encounter (Signed)
aprazalam 2 mg needs to be called into [facility] please

## 2016-05-18 ENCOUNTER — Other Ambulatory Visit: Payer: Medicare Other

## 2016-05-19 ENCOUNTER — Other Ambulatory Visit (INDEPENDENT_AMBULATORY_CARE_PROVIDER_SITE_OTHER): Payer: Medicare Other

## 2016-05-19 DIAGNOSIS — E1065 Type 1 diabetes mellitus with hyperglycemia: Secondary | ICD-10-CM | POA: Diagnosis not present

## 2016-05-19 LAB — COMPREHENSIVE METABOLIC PANEL
ALT: 6 U/L (ref 0–53)
AST: 14 U/L (ref 0–37)
Albumin: 3.6 g/dL (ref 3.5–5.2)
Alkaline Phosphatase: 60 U/L (ref 39–117)
BUN: 10 mg/dL (ref 6–23)
CHLORIDE: 97 meq/L (ref 96–112)
CO2: 35 mEq/L — ABNORMAL HIGH (ref 19–32)
CREATININE: 0.93 mg/dL (ref 0.40–1.50)
Calcium: 8.9 mg/dL (ref 8.4–10.5)
GFR: 89.09 mL/min (ref >60.00)
GLUCOSE: 185 mg/dL — AB (ref 70–99)
POTASSIUM: 4.8 meq/L (ref 3.5–5.1)
SODIUM: 133 meq/L — AB (ref 135–145)
TOTAL PROTEIN: 6.3 g/dL (ref 6.0–8.3)
Total Bilirubin: 0.4 mg/dL (ref 0.2–1.2)

## 2016-05-19 LAB — HEMOGLOBIN A1C: HEMOGLOBIN A1C: 7.6 % — AB (ref 4.6–6.5)

## 2016-05-20 ENCOUNTER — Telehealth: Payer: Self-pay | Admitting: Endocrinology

## 2016-05-20 MED ORDER — ALPRAZOLAM 2 MG PO TABS: ORAL_TABLET | ORAL | 0 refills | Status: DC

## 2016-05-20 NOTE — Telephone Encounter (Signed)
See message and please advise if ok to refill. Thanks!  

## 2016-05-20 NOTE — Telephone Encounter (Signed)
alprazalam needs to be called into [facility] [address] please °

## 2016-05-20 NOTE — Telephone Encounter (Signed)
I printed  

## 2016-05-20 NOTE — Telephone Encounter (Signed)
Refill Faxed.

## 2016-05-23 ENCOUNTER — Encounter: Payer: Self-pay | Admitting: Endocrinology

## 2016-05-23 ENCOUNTER — Ambulatory Visit (INDEPENDENT_AMBULATORY_CARE_PROVIDER_SITE_OTHER): Payer: Medicare Other | Admitting: Endocrinology

## 2016-05-23 ENCOUNTER — Other Ambulatory Visit: Payer: Self-pay | Admitting: *Deleted

## 2016-05-23 VITALS — BP 144/82 | HR 56 | Temp 98.0°F | Resp 16 | Ht 71.0 in | Wt 160.4 lb

## 2016-05-23 DIAGNOSIS — E1042 Type 1 diabetes mellitus with diabetic polyneuropathy: Secondary | ICD-10-CM

## 2016-05-23 DIAGNOSIS — E1065 Type 1 diabetes mellitus with hyperglycemia: Secondary | ICD-10-CM

## 2016-05-23 DIAGNOSIS — I10 Essential (primary) hypertension: Secondary | ICD-10-CM

## 2016-05-23 DIAGNOSIS — I70209 Unspecified atherosclerosis of native arteries of extremities, unspecified extremity: Secondary | ICD-10-CM

## 2016-05-23 MED ORDER — "PEN NEEDLES 5/16"" 31G X 8 MM MISC": 5 refills | Status: DC

## 2016-05-23 MED ORDER — INSULIN DEGLUDEC 100 UNIT/ML ~~LOC~~ SOPN: PEN_INJECTOR | SUBCUTANEOUS | 3 refills | Status: DC

## 2016-05-23 MED ORDER — GABAPENTIN 600 MG PO TABS: 600.0000 mg | ORAL_TABLET | Freq: Three times a day (TID) | ORAL | 2 refills | Status: DC

## 2016-05-23 MED ORDER — GABAPENTIN 600 MG PO TABS: ORAL_TABLET | ORAL | 3 refills | Status: DC

## 2016-05-23 NOTE — Progress Notes (Signed)
Patient ID: Peter Newness., male   DOB: January 17, 1961, 55 y.o.   MRN: [id number]   Reason for Appointment: Diabetes follow-up   History of Present Illness   Diagnosis: Type 1 diabetes mellitus, diagnosis 1991.  He has been on basal bolus insulin regimen for a few years His A1c previously was consistently around 7-7.5 He tends to have significant fluctuation in blood sugars at all times. He has difficulty keeping his postprandial blood sugars consistent with mealtime insulin coverage and estimating the amount of insulin needed for various kinds of foods since he does not count carbohydrates A1c has been higher since 2014  Recent history:   The insulin regimen is Lantus 36--34 with premeal Novolog 4-6 usually  He was switched from twice a day Lantus to [place] in 4/17 with more stable control However his pharmacy has again given Lantus and he does not know why  A1c is slightly higher than usual at 7.6, unchanged  Current problems and blood sugar patterns  His fasting blood sugars are variable as before but not consistently high  HIGHEST blood sugars are during the night even though his bedtime readings are not consistently high  He does tend to have a relatively large bedtime snack with a glass of milk especially blood sugars are low normal  Has rare nocturnal hypoglycemia  Has had only occasional low blood sugars later in the day, once before suppertime and this morning with taking a correction dose of NovoLog at 4 AM for blood sugar of 206  [name] taking his insulin right before eating consistently  However despite readings recently that are low normal after supper he has not adjusted his NovoLog, still taking mostly 6 units at suppertime and most meals  Not able to do much physical activity lately because of various pains  Blood Glucose readings: From monitor download as below  Mean values apply above for all meters except median for One Touch  PRE-MEAL Fasting  Lunch Dinner Bedtime Overall  Glucose range: 46-308 62-184 50-245  76-265    Mean/median: 167   155   160   POST-MEAL PC B PC Lunch Overnight   Glucose range: 68-251   46-285   Mean/median:   197       Hypoglycemia awareness: Usually develops symptoms when blood glucose is less than 50, usually sweating.   Meals: Inconsistent schedule, Inconsistent quantity. Breakfast is at 6-7 am Supper 6-7 pm  Toast, milk; in ams; 1/2 sandwich or peanut butter crackers at bedtime  Physical activity: exercise: Variable  Dietician visit: Most recent:, 4/09.   Wt Readings from Last 3 Encounters:  05/23/16 160 lb 6.4 oz (72.8 kg)  02/18/16 153 lb (69.4 kg)  12/29/15 152 lb 12 oz (69.3 kg)    Lab Results  Component Value Date   HGBA1C 7.6 (H) 05/19/2016   HGBA1C 7.5 02/18/2016   HGBA1C 7.5 10/23/2015   Lab Results  Component Value Date   MICROALBUR 8.3 (H) 02/18/2016   LDLCALC 109 (H) 02/18/2016   CREATININE 0.93 05/19/2016    Other active problems: See review of systems       Medication List       Accurate as of 05/23/16  1:56 PM. Always use your most recent med list.          alprazolam 2 MG tablet Commonly known as:  XANAX Take 1 tablet twice a day as needed for anxiety.   busPIRone 10 MG tablet Commonly known as:  BUSPAR  Take 1 tablet (10 mg total) by mouth 3 (three) times daily.   glucose blood test strip Use to check blood sugars 7 times per day dx code E10.41   ibuprofen 200 MG tablet Commonly known as:  ADVIL,MOTRIN Take 200 mg by mouth every 6 (six) hours as needed.   insulin aspart 100 UNIT/ML FlexPen Commonly known as:  NOVOLOG FLEXPEN Inject 6-8 units 4 times per day   LANTUS SOLOSTAR 100 UNIT/ML Solostar Pen Generic drug:  Insulin Glargine Inject into the skin daily. Takes 36 units in am and 34 units at night   onetouch ultrasoft lancets Use as instructed to check blood sugars 7 times per day   oxycodone 30 MG immediate release tablet Commonly  known as:  ROXICODONE   PEN NEEDLES 31GX5/16" 31G X 8 MM Misc Use to check blood sugar 7 times per day   triamcinolone cream 0.1 % Commonly known as:  KENALOG Apply topically 2 (two) times daily.   valsartan 80 MG tablet Commonly known as:  DIOVAN Take 1 tablet (80 mg total) by mouth daily.       Allergies:  Allergies  Allergen Reactions  . Pravastatin Sodium     myalgia    Past Medical History:  Diagnosis Date  . Chicken pox   . Claudication (HCC)   . Diabetes 1.5, managed as type 1 (HCC)   . Hypertension   . Low back pain   . Tobacco abuse     Past Surgical History:  Procedure Laterality Date  . CATARACT EXTRACTION Bilateral   . INTRAOCULAR LENS INSERTION Bilateral   . RETINAL DETACHMENT SURGERY Bilateral     Family History  Problem Relation Age of Onset  . Diabetes Mother   . Atrial fibrillation Mother   . Diabetes Maternal Uncle   . Diabetes Maternal Grandmother   . Heart attack Maternal Grandmother   . Esophageal cancer Maternal Grandfather   . Heart attack Father   . Stroke Father   . Arthritis Father   . Heart attack Sister     at age 48    Social History:  reports that he has been smoking Cigarettes.  He has been smoking about 0.50 packs per day. He has never used smokeless tobacco. He reports that he does not drink alcohol. His drug history is not on file.  Review of Systems   HYPERTENSION: Blood pressure is Well-controlled   He has taking Diovan instead of ramipril now     Lipids: LDL has been borderline high   Lab Results  Component Value Date   CHOL 166 02/18/2016   HDL 45.70 02/18/2016   LDLCALC 109 (H) 02/18/2016   TRIG 54.0 02/18/2016   CHOLHDL 4 02/18/2016    HYPONATREMIA: Long-standing and asymptomatic. 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  Most likely has SIADH from  chronic pain medications.   Demeclocycline was prescribed previously but  not covered by insurance.  Cortrosyn stimulation test was also normal   Urinary sodium previously was 138 and U Osm 245  Again asymptomatic, Sodium is improved now   Lab Results  Component Value Date   CREATININE 0.93 05/19/2016   BUN 10 05/19/2016   NA 133 (L) 05/19/2016   K 4.8 05/19/2016   CL 97 05/19/2016   CO2 35 (H) 05/19/2016    Chronic back pain, taking narcotic  medications as needed  Visual loss from retinopathy, followed by ophthalmologist  He has had symptoms of neuropathy,having shooting pains Recently, previously treated with gabapentin;  last diabetic foot exam In 4/17  Anxiety: He takes Xanax regularly twice a day but apparently got only a reduced dose from his PCP who has referred him to psychiatrist    Examination:   BP (!) 144/82   Pulse (!) 56   Temp 98 F (36.7 C)   Resp 16   Ht 5\' 11"  (1.803 m)   Wt 160 lb 6.4 oz (72.8 kg)   SpO2 98%   BMI 22.37 kg/m   Body mass index is 22.37 kg/m.    Assesment/PLAN:  Diabetes type 1, long-standing with fair control  See history of present illness for detailed discussion of current blood sugar patterns, problems identified and current management  Although he had somewhat better and more stable sugars with Peter Mendez he is back on Lantus even though he was instructed to change over However his A1c is about the same Blood sugars are fluctuating especially overnight and in the evenings Highest blood sugars are overnight This may be from his over treating low normal blood sugars at bedtime  He may do better with reducing his suppertime coverage to 5 units instead of 6 since sugars are recently low-normal after supper Also he will try not to overtreat low blood sugars and also not have excessive carbohydrates at bedtime such as milk Will send prescription for Tresiba to switch over as this will help reduce the fluctuation in his blood sugars  and hopefully less hypoglycemia   2.  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  The sodium is better at 133 now  3. Hypertension: He has normal blood pressure with 80 mg Diovan without any hyperkalemia  4.  Chronic anxiety, Gen. medical care:  he will try to establish with a PCP    5.  NEUROPATHY: [name] having recurrent peripheral nerve pain symptoms and will restart gabapentin as needed, most likely will need it only at bedtime  Counseling time on subjects discussed above is over 50% of today's 25 minute visit     There are no Patient Instructions on file for this visit.   Margareta Laureano 05/23/2016, 1:56 PM

## 2016-05-23 NOTE — Patient Instructions (Addendum)
Novolog 5 at supper unless eating more starch  RESTART TRESIBA after Lantus out , use 48 units once daily  No milk at [employer]

## 2016-06-20 ENCOUNTER — Other Ambulatory Visit: Payer: Self-pay

## 2016-06-20 ENCOUNTER — Telehealth: Payer: Self-pay | Admitting: Endocrinology

## 2016-06-20 ENCOUNTER — Other Ambulatory Visit: Payer: Self-pay | Admitting: Endocrinology

## 2016-06-20 MED ORDER — ALPRAZOLAM 2 MG PO TABS: ORAL_TABLET | ORAL | 0 refills | Status: DC

## 2016-06-20 NOTE — Telephone Encounter (Signed)
Ordered 06/20/16  

## 2016-06-20 NOTE — Telephone Encounter (Signed)
alprazalam needs to be called into [facility] [address] please

## 2016-06-21 NOTE — Telephone Encounter (Signed)
Patient stated that his pharmacy have  Not received his prescription for alprazolam Prudy Feeler(XANAX) 2 MG tablet    Please advise

## 2016-06-21 NOTE — Telephone Encounter (Signed)
Patient need a refill  for medication alprazolam Prudy Feeler(XANAX) 2 MG tablet  [facility] - [place], [place] - [address] [phone] (Phone) [phone] (Fax)

## 2016-06-21 NOTE — Telephone Encounter (Signed)
Please resubmit Alprazolam script, pharmacy is claiming they have not received it.

## 2016-06-21 NOTE — Telephone Encounter (Signed)
Please advise If ok to refill.

## 2016-06-22 NOTE — Telephone Encounter (Signed)
Misty StanleyLisa,  This is Dr. Remus BlakeKumar's patient.

## 2016-07-20 ENCOUNTER — Other Ambulatory Visit: Payer: Self-pay

## 2016-07-20 ENCOUNTER — Telehealth: Payer: Self-pay | Admitting: Endocrinology

## 2016-07-20 ENCOUNTER — Other Ambulatory Visit: Payer: Self-pay | Admitting: Endocrinology

## 2016-07-20 NOTE — Telephone Encounter (Signed)
[facility] called stated a prescription was faxed over, it wasn't signed.  Please advise

## 2016-07-21 ENCOUNTER — Telehealth: Payer: Self-pay

## 2016-07-21 MED ORDER — ALPRAZOLAM 2 MG PO TABS: ORAL_TABLET | ORAL | 0 refills | Status: DC

## 2016-07-21 NOTE — Telephone Encounter (Signed)
alprazalam needs to be sent in 2 mg tabs please   Call into [facility]

## 2016-07-21 NOTE — Telephone Encounter (Signed)
Spoke with the pharmacy and I reordered

## 2016-07-21 NOTE — Telephone Encounter (Signed)
Ordered 07/21/16 

## 2016-08-18 ENCOUNTER — Other Ambulatory Visit (INDEPENDENT_AMBULATORY_CARE_PROVIDER_SITE_OTHER): Payer: Medicare Other

## 2016-08-18 DIAGNOSIS — E1065 Type 1 diabetes mellitus with hyperglycemia: Secondary | ICD-10-CM | POA: Diagnosis not present

## 2016-08-18 LAB — BASIC METABOLIC PANEL
BUN: 8 mg/dL (ref 6–23)
CALCIUM: 8.9 mg/dL (ref 8.4–10.5)
CO2: 35 meq/L — AB (ref 19–32)
Chloride: 94 mEq/L — ABNORMAL LOW (ref 96–112)
Creatinine, Ser: 0.85 mg/dL (ref 0.40–1.50)
GFR: 98.74 mL/min (ref >60.00)
Glucose, Bld: 96 mg/dL (ref 70–99)
Potassium: 4.6 mEq/L (ref 3.5–5.1)
SODIUM: 132 meq/L — AB (ref 135–145)

## 2016-08-18 LAB — LIPID PANEL
CHOL/HDL RATIO: 3
Cholesterol: 136 mg/dL (ref 0–200)
HDL: 45.4 mg/dL (ref >39.00)
LDL Cholesterol: 81 mg/dL (ref 0–99)
NONHDL: 90.91
Triglycerides: 50 mg/dL (ref 0.0–149.0)
VLDL: 10 mg/dL (ref 0.0–40.0)

## 2016-08-18 LAB — HEMOGLOBIN A1C: Hgb A1c MFr Bld: 7.5 % — ABNORMAL HIGH (ref 4.6–6.5)

## 2016-08-23 ENCOUNTER — Encounter: Payer: Self-pay | Admitting: Endocrinology

## 2016-08-23 ENCOUNTER — Ambulatory Visit (INDEPENDENT_AMBULATORY_CARE_PROVIDER_SITE_OTHER): Payer: Medicare Other | Admitting: Endocrinology

## 2016-08-23 VITALS — BP 148/72 | HR 65 | Ht 71.0 in | Wt 155.0 lb

## 2016-08-23 DIAGNOSIS — I10 Essential (primary) hypertension: Secondary | ICD-10-CM | POA: Diagnosis not present

## 2016-08-23 DIAGNOSIS — E1042 Type 1 diabetes mellitus with diabetic polyneuropathy: Secondary | ICD-10-CM

## 2016-08-23 DIAGNOSIS — F419 Anxiety disorder, unspecified: Secondary | ICD-10-CM | POA: Diagnosis not present

## 2016-08-23 DIAGNOSIS — E1065 Type 1 diabetes mellitus with hyperglycemia: Secondary | ICD-10-CM

## 2016-08-23 MED ORDER — ALPRAZOLAM 2 MG PO TABS: ORAL_TABLET | ORAL | 0 refills | Status: DC

## 2016-08-23 NOTE — Progress Notes (Signed)
Patient ID: Peter Mendez., male   DOB: 15-Mar-1961, 56 y.o.   MRN: [id number]   Reason for Appointment: Diabetes follow-up   History of Present Illness   Diagnosis: Type 1 diabetes mellitus, diagnosis 1991.  He has been on basal bolus insulin regimen for a few years His A1c previously was consistently around 7-7.5 He tends to have significant fluctuation in blood sugars at all times. He has difficulty keeping his postprandial blood sugars consistent with mealtime insulin coverage and estimating the amount of insulin needed for various kinds of foods since he does not count carbohydrates A1c has been higher since 2014  Recent history:   The insulin regimen is Lantus 36--34 with premeal Novolog 4-6 usually  He was switched from twice a day Lantus to [place] in 4/17 with more stable control However his pharmacy switched him back to Lantus but he now is able to get the [place] again  A1c is 7.5, about the same as usual  Current problems and blood sugar patterns  His fasting blood sugars are variable but appear to be mostly high unless he is taking extra insulin early morning to bring it down  He checks his blood sugars 8-9 times a day including at least a couple of times during the night usually  HIGHEST blood sugars are now late in the evening and some overnight  However he has had higher readings probably related to respiratory illness which is not completely gone  More recently his blood sugars are generally higher before and after supper time but he is not taking enough insulin when the blood sugars are high  Has not adjusted Lantus  LOWEST blood sugars are usually after breakfast and before lunch  [name] unable to see his blood sugars on his meter but is relying on his mother or a close friend to help with his day-to-day management  Not able to do much physical activity lately because of various pains  HYPOGLYCEMIA has been minimal  Blood Glucose readings: From  monitor download as below  Mean values apply above for all meters except median for One Touch  PRE-MEAL Fasting Lunch Dinner Overnight  Overall  Glucose range: 93-284   70-345  47-408    Mean/median: 206  159  184   181   POST-MEAL PC Breakfast PC Lunch PC Dinner  Glucose range:   62-427   Mean/median:   190      Hypoglycemia awareness: Usually develops symptoms when blood glucose is less than 50, usually sweating.   Meals: Inconsistent schedule, Inconsistent quantity. Breakfast is at 6-7 am Supper 6-7 pm  Toast, milk; in ams; 1/2 sandwich or peanut butter crackers at bedtime  Physical activity: exercise: Variable  Dietician visit: Most recent:, 4/09.   Wt Readings from Last 3 Encounters:  08/23/16 155 lb (70.3 kg)  05/23/16 160 lb 6.4 oz (72.8 kg)  02/18/16 153 lb (69.4 kg)    Lab Results  Component Value Date   HGBA1C 7.5 (H) 08/18/2016   HGBA1C 7.6 (H) 05/19/2016   HGBA1C 7.5 02/18/2016   Lab Results  Component Value Date   MICROALBUR 8.3 (H) 02/18/2016   LDLCALC 81 08/18/2016   CREATININE 0.85 08/18/2016    Other active problems: See review of systems     Allergies as of 08/23/2016      Reactions   Pravastatin Sodium    myalgia      Medication List       Accurate as of 08/23/16  1:57 PM. Always use your most recent med list.          alprazolam 2 MG tablet Commonly known as:  XANAX take 1 tablet by mouth twice a day if needed for anxiety   busPIRone 10 MG tablet Commonly known as:  BUSPAR Take 1 tablet (10 mg total) by mouth 3 (three) times daily.   gabapentin 600 MG tablet Commonly known as:  NEURONTIN Take 1 tablet 3 times a day as needed   glucose blood test strip Use to check blood sugars 7 times per day dx code E10.41   ibuprofen 200 MG tablet Commonly known as:  ADVIL,MOTRIN Take 200 mg by mouth every 6 (six) hours as needed.   insulin degludec 100 UNIT/ML Sopn FlexTouch Pen Commonly known as:  TRESIBA FLEXTOUCH Inject 48 units  daily   LANTUS SOLOSTAR 100 UNIT/ML Solostar Pen Generic drug:  Insulin Glargine Inject into the skin daily. Takes 36 units in am and 34 units at night   NOVOLOG FLEXPEN 100 UNIT/ML FlexPen Generic drug:  insulin aspart INJECT 6 TO 8 UNITS INTO SKIN 4 TIMES DAILY   onetouch ultrasoft lancets Use as instructed to check blood sugars 7 times per day   oxycodone 30 MG immediate release tablet Commonly known as:  ROXICODONE every 4 (four) hours as needed.   PEN NEEDLES 31GX5/16" 31G X 8 MM Misc Use to check blood sugar 7 times per day   triamcinolone cream 0.1 % Commonly known as:  KENALOG Apply topically 2 (two) times daily.   valsartan 80 MG tablet Commonly known as:  DIOVAN Take 1 tablet (80 mg total) by mouth daily.       Allergies:  Allergies  Allergen Reactions  . Pravastatin Sodium     myalgia    Past Medical History:  Diagnosis Date  . Chicken pox   . Claudication (HCC)   . Diabetes 1.5, managed as type 1 (HCC)   . Hypertension   . Low back pain   . Tobacco abuse     Past Surgical History:  Procedure Laterality Date  . CATARACT EXTRACTION Bilateral   . INTRAOCULAR LENS INSERTION Bilateral   . RETINAL DETACHMENT SURGERY Bilateral     Family History  Problem Relation Age of Onset  . Diabetes Mother   . Atrial fibrillation Mother   . Diabetes Maternal Uncle   . Diabetes Maternal Grandmother   . Heart attack Maternal Grandmother   . Esophageal cancer Maternal Grandfather   . Heart attack Father   . Stroke Father   . Arthritis Father   . Heart attack Sister     at age 57    Social History:  reports that he has been smoking Cigarettes.  He has been smoking about 0.50 packs per day. He has never used smokeless tobacco. He reports that he does not drink alcohol. His drug history is not on file.  Review of Systems   HYPERTENSION: Blood pressure is Higher today, he tends this is related to traffic today and also taking a decongestant for his  court Has not monitored at home lately   He has taking Diovan instead of ramipril now  Lipids: LDL has been well controlled, currently not on any statin drugs   Lab Results  Component Value Date   CHOL 136 08/18/2016   HDL 45.40 08/18/2016   LDLCALC 81 08/18/2016   TRIG 50.0 08/18/2016   CHOLHDL 3 08/18/2016    HYPONATREMIA: Long-standing and asymptomatic. 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   Demeclocycline was prescribed previously but  not covered by insurance.  Cortrosyn stimulation test was also normal   Urinary sodium previously was 138 and U Osm 245  Again asymptomatic, Sodium is staying consistently mildly reduced   Lab Results  Component Value Date   CREATININE 0.85 08/18/2016   BUN 8 08/18/2016   NA 132 (L) 08/18/2016   K 4.6 08/18/2016   CL 94 (L) 08/18/2016   CO2 35 (H) 08/18/2016    Chronic back pain, taking narcotic medications as needed He says he is followed by the pain clinic regularly  Visual loss from retinopathy, followed by ophthalmologist  He has had symptoms of neuropathy,having shooting pains, recently again treated with gabapentin;  last diabetic foot exam In 4/17  Anxiety: He takes Xanax regularly twice a day and apparently has been taking this for 20 years He says he does not feel good when he does not take it and has not taken any for 2 days Has had difficulty getting this refilled from PCP Has not had any difficulties with somnolence even with combining with pain medications and does not ask for prescription ahead of time   Examination:   BP (!) 148/72   Pulse 65   Ht 5\' 11"  (1.803 m)   Wt 155 lb (70.3 kg)   SpO2 97%   BMI 21.62 kg/m   Body mass index is 21.62 kg/m.    Assesment/PLAN:  Diabetes type 1, long-standing  with fair control  See history of present illness for detailed discussion of current blood sugar patterns, problems identified and current management  His blood sugars have been erratic and overall higher especially overnight recently He had done better with [place] to some degree with less fluctuation He thinks he may go to get this again Also has not increased his insulin doses despite relatively high readings from his upper respiratory infection  Recommendations:  Increase Lantus by 2 units in the morning and if suppertime readings are still over 180, go up to 40 units  Also may consider increasing evening Lantus if nighttime readings are high  Increase NovoLog by 2 units at suppertime and also take extra 1-2 units for blood sugars over 180 before meals  He will last the pharmacist if he can get the freestyle [place] system and described how this would be used.  When he switches to [place] he will start with 48 units once a day and discussed adjusting the dose based on morning readings   2.  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  The sodium is stable around 132   3. Hypertension: He has a relatively high reading today and he will need to start monitoring at home, maybe higher because of taking a decongestant and also stress  4.  Chronic anxiety,  he will try to establish with a PCP to start getting his chronic prescriptions, have given him Xanax until he establishes a new M.D.   5.  NEUROPATHY: [name] having some relief of the peripheral nerve pain with gabapentin  Counseling time on subjects discussed above is over 50% of today's 25 minute visit     There are no Patient Instructions on file for this visit.   Via Rosado 08/23/2016, 1:57 PM

## 2016-08-23 NOTE — Patient Instructions (Addendum)
Tresiba 48 units daily  May reduce if am waking sugars get below 110

## 2016-11-12 ENCOUNTER — Other Ambulatory Visit: Payer: Self-pay | Admitting: Endocrinology

## 2016-11-13 ENCOUNTER — Other Ambulatory Visit: Payer: Self-pay | Admitting: Endocrinology

## 2016-11-17 ENCOUNTER — Other Ambulatory Visit (INDEPENDENT_AMBULATORY_CARE_PROVIDER_SITE_OTHER): Payer: Medicare Other

## 2016-11-17 DIAGNOSIS — E1065 Type 1 diabetes mellitus with hyperglycemia: Secondary | ICD-10-CM | POA: Diagnosis not present

## 2016-11-17 LAB — BASIC METABOLIC PANEL
BUN: 7 mg/dL (ref 6–23)
CHLORIDE: 93 meq/L — AB (ref 96–112)
CO2: 33 meq/L — AB (ref 19–32)
CREATININE: 0.94 mg/dL (ref 0.40–1.50)
Calcium: 9 mg/dL (ref 8.4–10.5)
GFR: 87.84 mL/min (ref >60.00)
GLUCOSE: 306 mg/dL — AB (ref 70–99)
Potassium: 4.8 mEq/L (ref 3.5–5.1)
Sodium: 128 mEq/L — ABNORMAL LOW (ref 135–145)

## 2016-11-17 LAB — HEMOGLOBIN A1C: Hgb A1c MFr Bld: 7.9 % — ABNORMAL HIGH (ref 4.6–6.5)

## 2016-11-21 ENCOUNTER — Encounter: Payer: Self-pay | Admitting: Endocrinology

## 2016-11-21 ENCOUNTER — Ambulatory Visit (INDEPENDENT_AMBULATORY_CARE_PROVIDER_SITE_OTHER): Payer: Medicare Other | Admitting: Endocrinology

## 2016-11-21 VITALS — BP 142/70 | HR 76 | Ht 71.0 in | Wt 154.0 lb

## 2016-11-21 DIAGNOSIS — E1065 Type 1 diabetes mellitus with hyperglycemia: Secondary | ICD-10-CM

## 2016-11-21 DIAGNOSIS — E1042 Type 1 diabetes mellitus with diabetic polyneuropathy: Secondary | ICD-10-CM | POA: Diagnosis not present

## 2016-11-21 DIAGNOSIS — F419 Anxiety disorder, unspecified: Secondary | ICD-10-CM

## 2016-11-21 MED ORDER — ALPRAZOLAM 2 MG PO TABS: ORAL_TABLET | ORAL | 0 refills | Status: DC

## 2016-11-21 NOTE — Progress Notes (Signed)
Patient ID: Peter Mendez., male   DOB: [birth year], 56 y.o.   MRN: [id number]   Reason for Appointment: Diabetes follow-up   History of Present Illness   Diagnosis: Type 1 diabetes mellitus, diagnosis 1991.  He has been on basal bolus insulin regimen for a few years His A1c previously was consistently around 7-7.5 He tends to have significant fluctuation in blood sugars at all times. He has difficulty keeping his postprandial blood sugars consistent with mealtime insulin coverage and estimating the amount of insulin needed for various kinds of foods since he does not count carbohydrates A1c has been higher since 2014  Recent history:   The insulin regimen is Lantus 36--34 with premeal Novolog 6-8 usually  He was switched from twice a day Lantus to [place] in 4/17 with more stable control but he stopped this again as he thought it was making him sick  A1c is  higher than before at 7.9  Current problems and blood sugar patterns  He now says that he was taking [place] along with Lantus when he switched instead of dropping Lantus  His fasting blood sugars are variable but not as high as before  [name] also getting occasional low blood sugars in the mornings  Overall he has not had as many high readings during the night as before but there are still mostly high probably from getting extra snacks late at night  He checks his blood sugars 9 times a day at least including at least a couple of times during the night  Difficult to know which of his readings are after meals in the evenings  HIGHEST blood sugars are overall in the afternoons and around suppertime with median readings over 200  Last night had pasta and bread both together and his blood sugars went up over 300: Although he thinks he is adjusting his NovoLog based on what he is eating it is only one or 2 units, may take extra insulin when blood sugars are high  However does not have consistently high readings at  any time except around 3 AM   [name] unable to see his blood sugars on his meter but is relying on his mother or a close friend to help with his day-to-day management  Not able to do much physical activity lately because of various pains  HYPOGLYCEMIA has been occurring sporadically at all times especially waking up and midday  Blood Glucose readings: From monitor download as below  Mean values apply above for all meters except median for One Touch  PRE-MEAL Fasting Lunch Dinner Overnight  Overall  Glucose range: 46-3 19  48-3 47   83-3 43    Mean/median:  188  221  178  189+/-72    POST-MEAL PC Breakfast PC Lunch PC Dinner  Glucose range:   107-317   Mean/median:   176     Hypoglycemia awareness: Usually develops symptoms when blood glucose is less than 50, usually sweating.   Meals: Inconsistent schedule, Inconsistent quantity. Breakfast is at 6-7 am Supper 6-7 pm  Variable intake at breakfast; 1/2 sandwich or peanut butter crackers at bedtime  Physical activity: exercise: Not much walking recently   Dietician visit: Most recent:, 4/09.   Wt Readings from Last 3 Encounters:  11/21/16 154 lb (69.9 kg)  08/23/16 155 lb (70.3 kg)  05/23/16 160 lb 6.4 oz (72.8 kg)    Lab Results  Component Value Date   HGBA1C 7.9 (H) 11/17/2016   HGBA1C  7.5 (H) 08/18/2016   HGBA1C 7.6 (H) 05/19/2016   Lab Results  Component Value Date   MICROALBUR 8.3 (H) 02/18/2016   LDLCALC 81 08/18/2016   CREATININE 0.94 11/17/2016    Other active problems: See review of systems     Allergies as of 11/21/2016      Reactions   Pravastatin Sodium    myalgia      Medication List       Accurate as of 11/21/16  1:12 PM. Always use your most recent med list.          alprazolam 2 MG tablet Commonly known as:  XANAX take 1 tablet by mouth twice a day if needed for anxiety   busPIRone 10 MG tablet Commonly known as:  BUSPAR Take 1 tablet (10 mg total) by mouth 3 (three) times daily.     gabapentin 600 MG tablet Commonly known as:  NEURONTIN Take 1 tablet 3 times a day as needed   glucose blood test strip Use to check blood sugars 7 times per day dx code E10.41   ibuprofen 200 MG tablet Commonly known as:  ADVIL,MOTRIN Take 200 mg by mouth every 6 (six) hours as needed.   LANTUS SOLOSTAR 100 UNIT/ML Solostar Pen Generic drug:  Insulin Glargine Inject into the skin daily. Takes 36 units in am and 34 units at night   LANTUS SOLOSTAR 100 UNIT/ML Solostar Pen Generic drug:  Insulin Glargine INJECT 36 UNITS SUBCUTANEOUSLY EVERY MORNING AND 34 UNITS EVERY EVENING   NOVOLOG FLEXPEN 100 UNIT/ML FlexPen Generic drug:  insulin aspart INJECT 6 TO 8 UNITS INTO SKIN 4 TIMES DAILY   onetouch ultrasoft lancets Use as instructed to check blood sugars 7 times per day   oxycodone 30 MG immediate release tablet Commonly known as:  ROXICODONE every 4 (four) hours as needed.   PEN NEEDLES 31GX5/16" 31G X 8 MM Misc Use to check blood sugar 7 times per day   ramipril 5 MG capsule Commonly known as:  ALTACE take 1 capsule by mouth once daily   triamcinolone cream 0.1 % Commonly known as:  KENALOG Apply topically 2 (two) times daily.   valsartan 80 MG tablet Commonly known as:  DIOVAN Take 1 tablet (80 mg total) by mouth daily.       Allergies:  Allergies  Allergen Reactions  . Pravastatin Sodium     myalgia    Past Medical History:  Diagnosis Date  . Chicken pox   . Claudication (HCC)   . Diabetes 1.5, managed as type 1 (HCC)   . Hypertension   . Low back pain   . Tobacco abuse     Past Surgical History:  Procedure Laterality Date  . CATARACT EXTRACTION Bilateral   . INTRAOCULAR LENS INSERTION Bilateral   . RETINAL DETACHMENT SURGERY Bilateral     Family History  Problem Relation Age of Onset  . Diabetes Mother   . Atrial fibrillation Mother   . Diabetes Maternal Uncle   . Diabetes Maternal Grandmother   . Heart attack Maternal Grandmother   .  Esophageal cancer Maternal Grandfather   . Heart attack Father   . Stroke Father   . Arthritis Father   . Heart attack Sister     at age 57    Social History:  reports that he has been smoking Cigarettes.  He has been smoking about 0.50 packs per day. He has never used smokeless tobacco. He reports that he does not drink alcohol. His  drug history is not on file.  Review of Systems   HYPERTENSION: Blood pressure is fair on ramipril  He was advised to use Diovan but not clear why he went back to ramipril 132/78  Lipids: LDL has been well controlled, currently not on any statin drugs   Lab Results  Component Value Date   CHOL 136 08/18/2016   HDL 45.40 08/18/2016   LDLCALC 81 08/18/2016   TRIG 50.0 08/18/2016   CHOLHDL 3 08/18/2016    HYPONATREMIA: Long-standing and asymptomatic. 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   Demeclocycline was prescribed previously but  not covered by insurance.  Cortrosyn stimulation test was also normal   Urinary sodium previously was 138 and U Osm 245  Again asymptomatic, Sodium is Lower than usual at 128  He thinks he has been drinking more fluids with diet drinks, coffee and water without measuring them   Lab Results  Component Value Date   CREATININE 0.94 11/17/2016   BUN 7 11/17/2016   NA 128 (L) 11/17/2016   K 4.8 11/17/2016   CL 93 (L) 11/17/2016   CO2 33 (H) 11/17/2016    Chronic back pain, taking narcotic medications as needed Up to every 4 hours He says he is followed by the pain clinic regularly  Visual loss from retinopathy, followed by ophthalmologist  He has had symptoms of neuropathy, He will get shooting pains, treated with gabapentin;  last diabetic foot exam In 4/17  Anxiety: He takes Xanax regularly twice a day  and  has been taking this for 20 years He says he does not feel good when he does not take it  Has not had any difficulties with somnolence even with combining with pain medications and does not ask for prescription ahead of time He will be seeing a PCP now or further prescriptions   Examination:   BP (!) 142/70   Pulse 76   Ht  (1.803 m)   Wt 154 lb (69.9 kg)   BMI 21.48 kg/m   Body mass index is 21.48 kg/m.    Assesment/PLAN:  Diabetes type 1, long-standing with fair control  See history of present illness for detailed discussion of current blood sugar patterns, problems identified and current management  His blood sugars have been erratic and he has not continued with [place] as prescribed which was helping with his fluctuating readings He may also have less hypoglycemia with using Tresiba, he has sporadic low sugars at various times including on waking up now He did not understand the need to switch completely from Lantus to using [place] and was possibly using both together His girlfriend was present today and she understood the instructions Also discussed that he needs to assess his carbohydrates better and taken as much as 10 units for higher carbohydrate or higher fat meals which he is not doing with higher readings after meals especially in the afternoon and evening  Recommendations:  Trial of the freestyle [place] sensor, discussed in detail how this would be used and how to go about getting from the DME supply; he will need to have the nurse educator show him and his family how to start using this when he gets this  When he switches to [place] he will start with 48 units once a day and  discussed adjusting the dose based on morning readings   2.  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  The sodium is lower than usual and may be related to his increased fluid intake  Advised him to cut back at least a third on his fluid  intake and try to patient is intake rather than drinking from large bottles   3. Hypertension: Fair control Also continue to monitor at home  4.  Chronic anxiety, have given him Xanax  He should be able to establish with his new PCP soon   5.  NEUROPATHY: Continue treatment with gabapentin He already takes narcotic analgesics for back pain  Counseling time on subjects discussed above is over 50% of today's 25 minute visit     Patient Instructions  [id number]     [facility] 11/21/2016, 1:12 PM

## 2016-11-21 NOTE — Patient Instructions (Addendum)
Tresiba 48 units daily once daily from 4/17  Cut fluid intake by at least 1/3 or 1/2  [employer] for [employer] sensor  Phone: [phone]

## 2016-11-23 ENCOUNTER — Telehealth: Payer: Self-pay | Admitting: Endocrinology

## 2017-01-09 ENCOUNTER — Ambulatory Visit (INDEPENDENT_AMBULATORY_CARE_PROVIDER_SITE_OTHER): Payer: Medicare Other | Admitting: Endocrinology

## 2017-01-09 ENCOUNTER — Encounter: Payer: Self-pay | Admitting: Endocrinology

## 2017-01-09 VITALS — BP 144/78 | HR 52 | Ht 70.0 in | Wt 160.8 lb

## 2017-01-09 DIAGNOSIS — E871 Hypo-osmolality and hyponatremia: Secondary | ICD-10-CM | POA: Diagnosis not present

## 2017-01-09 DIAGNOSIS — E1065 Type 1 diabetes mellitus with hyperglycemia: Secondary | ICD-10-CM

## 2017-01-09 DIAGNOSIS — I10 Essential (primary) hypertension: Secondary | ICD-10-CM

## 2017-01-09 LAB — BASIC METABOLIC PANEL
BUN: 7 mg/dL (ref 6–23)
CALCIUM: 9 mg/dL (ref 8.4–10.5)
CO2: 33 meq/L — AB (ref 19–32)
Chloride: 92 mEq/L — ABNORMAL LOW (ref 96–112)
Creatinine, Ser: 0.84 mg/dL (ref 0.40–1.50)
GFR: 99.96 mL/min (ref >60.00)
Glucose, Bld: 94 mg/dL (ref 70–99)
Potassium: 4.4 mEq/L (ref 3.5–5.1)
SODIUM: 128 meq/L — AB (ref 135–145)

## 2017-01-09 NOTE — Progress Notes (Signed)
Please call to let patient know that the sodium is still 128, needs to cut back on fluid intake and also pain medication if possible

## 2017-01-09 NOTE — Progress Notes (Signed)
Patient ID: Peter Mendez., male   DOB: [birth year], 56 y.o.   MRN: [id number]   Reason for Appointment: Diabetes follow-up   History of Present Illness   Diagnosis: Type 1 diabetes mellitus, diagnosis 1991.  He has been on basal bolus insulin regimen for a few years His A1c previously was consistently around 7-7.5 He tends to have significant fluctuation in blood sugars at all times. He has difficulty keeping his postprandial blood sugars consistent with mealtime insulin coverage and estimating the amount of insulin needed for various kinds of foods since he does not count carbohydrates A1c has been higher since 2014  Recent history:   The insulin regimen is [place] 42 units daily, before meals Novolog 6-8 usually  He was switched from twice a day Lantus to [place] in 4/18  Last A1c was 7.9  Current problems and blood sugar patterns  His fasting blood sugars are variable but not as high as before, however still averaging over 180  [name] getting less tendency to low sugars overnight with switching to [place]  However was tending to have some sporadic low sugars last month but not much recently  Again has significant variability in his blood sugars overall  Previously highest blood sugars were before suppertime and these are better  He checks his blood sugars 7.9 times a day at least including at least a couple of times during the night  HIGHEST blood sugars are overall after supper  [name] not adjusting his mealtime doses based on his carbohydrate intake which can be variable  He has gained weight from probably more, 100 intake and increased portions  Not able to do much physical activity lately because of various pains  HYPOGLYCEMIA has been occurring sporadically and minimal recently except when he took extra insulin at night 2 nights ago  Blood Glucose readings: From monitor download as below  Mean values apply above for all meters except median for One  Touch  PRE-MEAL Fasting Lunch Dinner Overnight  Overall  Glucose range: 70-276     47-467  Mean/median: 190  143  150  163  167+/-68   POST-MEAL PC Breakfast PC Lunch PC Dinner  Glucose range:   110-467   Mean/median:  162  195     Hypoglycemia awareness: Usually develops symptoms when blood glucose is less than 50, usually sweating.   Meals: Inconsistent schedule, Inconsistent quantity. Breakfast is at 6-7 am Supper 6-7 pm  Variable intake at breakfast; 1/2 sandwich or peanut butter crackers at bedtime  Physical activity: exercise: some walking     Dietician visit: Most recent:, 4/09.   Wt Readings from Last 3 Encounters:  01/09/17 160 lb 12.8 oz (72.9 kg)  11/21/16 154 lb (69.9 kg)  08/23/16 155 lb (70.3 kg)    Lab Results  Component Value Date   HGBA1C 7.9 (H) 11/17/2016   HGBA1C 7.5 (H) 08/18/2016   HGBA1C 7.6 (H) 05/19/2016   Lab Results  Component Value Date   MICROALBUR 8.3 (H) 02/18/2016   LDLCALC 81 08/18/2016   CREATININE 0.94 11/17/2016    Other active problems: See review of systems     Allergies as of 01/09/2017      Reactions   Pravastatin Sodium    myalgia      Medication List       Accurate as of 01/09/17  3:16 PM. Always use your most recent med list.          alprazolam 2 MG  tablet Commonly known as:  XANAX take 1 tablet by mouth twice a day if needed for anxiety   gabapentin 600 MG tablet Commonly known as:  NEURONTIN Take 1 tablet 3 times a day as needed   glucose blood test strip Use to check blood sugars 7 times per day dx code E10.41   ibuprofen 200 MG tablet Commonly known as:  ADVIL,MOTRIN Take 200 mg by mouth every 6 (six) hours as needed.   LANTUS SOLOSTAR 100 UNIT/ML Solostar Pen Generic drug:  Insulin Glargine Inject into the skin daily. Takes 36 units in am and 34 units at night   LANTUS SOLOSTAR 100 UNIT/ML Solostar Pen Generic drug:  Insulin Glargine INJECT 36 UNITS SUBCUTANEOUSLY EVERY MORNING AND 34 UNITS  EVERY EVENING   NOVOLOG FLEXPEN 100 UNIT/ML FlexPen Generic drug:  insulin aspart INJECT 6 TO 8 UNITS INTO SKIN 4 TIMES DAILY   onetouch ultrasoft lancets Use as instructed to check blood sugars 7 times per day   oxycodone 30 MG immediate release tablet Commonly known as:  ROXICODONE every 4 (four) hours as needed.   PEN NEEDLES 31GX5/16" 31G X 8 MM Misc Use to check blood sugar 7 times per day   ramipril 5 MG capsule Commonly known as:  ALTACE take 1 capsule by mouth once daily   TRESIBA FLEXTOUCH 100 UNIT/ML Sopn FlexTouch Pen Generic drug:  insulin degludec Inject 42 Units into the skin every morning.   triamcinolone cream 0.1 % Commonly known as:  KENALOG Apply topically 2 (two) times daily.       Allergies:  Allergies  Allergen Reactions  . Pravastatin Sodium     myalgia    Past Medical History:  Diagnosis Date  . Chicken pox   . Claudication (HCC)   . Diabetes 1.5, managed as type 1 (HCC)   . Hypertension   . Low back pain   . Tobacco abuse     Past Surgical History:  Procedure Laterality Date  . CATARACT EXTRACTION Bilateral   . INTRAOCULAR LENS INSERTION Bilateral   . RETINAL DETACHMENT SURGERY Bilateral     Family History  Problem Relation Age of Onset  . Diabetes Mother   . Atrial fibrillation Mother   . Diabetes Maternal Uncle   . Diabetes Maternal Grandmother   . Heart attack Maternal Grandmother   . Esophageal cancer Maternal Grandfather   . Heart attack Father   . Stroke Father   . Arthritis Father   . Heart attack Sister        at age 57    Social History:  reports that he has been smoking Cigarettes.  He has been smoking about 0.50 packs per day. He has never used smokeless tobacco. He reports that he does not drink alcohol. His drug history is not on file.  Review of Systems   HYPERTENSION: Blood pressure is fair on ramipril, somewhat anxious today   Home BP 132-136  Lipids: LDL has been well controlled, not on any statin  drugs   Lab Results  Component Value Date   CHOL 136 08/18/2016   HDL 45.40 08/18/2016   LDLCALC 81 08/18/2016   TRIG 50.0 08/18/2016   CHOLHDL 3 08/18/2016    HYPONATREMIA: Long-standing and asymptomatic. 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   Demeclocycline  was prescribed previously but  not covered by insurance.  Cortrosyn stimulation test was also normal   Urinary sodium previously was 138 and U Osm 245  Again asymptomatic, Sodium Was Lower than usual at 128 Still has been drinking more fluids with diet drinks, coffee and water    Lab Results  Component Value Date   CREATININE 0.94 11/17/2016   BUN 7 11/17/2016   NA 128 (L) 11/17/2016   K 4.8 11/17/2016   CL 93 (L) 11/17/2016   CO2 33 (H) 11/17/2016    Chronic back pain, taking narcotic medications as needed Up to every 4 hours Also followed by pain clinic regularly  Visual loss from retinopathy, followed by ophthalmologist  He has had symptoms of neuropathy, He will get shooting pains, treated with gabapentin;  last diabetic foot exam In 4/17  Anxiety: He takes Xanax regularly twice a day and  has been taking this for 20 years He says he does not feel good when he does not take it  Has not had any difficulties with somnolence even with combining with pain medications and does not ask for prescription ahead of time  He will be seeing a PCP now or further prescriptions   Examination:   BP (!) 144/78   Pulse (!) 52   Ht 5\' 10"  (1.778 m)   Wt 160 lb 12.8 oz (72.9 kg)   SpO2 97%   BMI 23.07 kg/m   Body mass index is 23.07 kg/m.    Assesment/PLAN:  Diabetes type 1, long-standing with fair control  See history of present illness for detailed discussion of current blood sugar patterns, problems identified  and current management  His blood sugars have been somewhat less erratic with switching from Lantus to [place] However he still has variability in his blood sugars Also fasting readings are still mostly high especially recently  Most of his high readings are after supper where they are more consistently higher [name] not able to count carbohydrates accurately although his usually aware of what carbohydrates are [name] not adjusting his mealtime doses based on meal size and carbohydrate intake and will take the same amount of insulin usually regardless Also occasionally with postprandial hyperglycemia even take extra insulin at bedtime causing relatively low sugars He has gained weight and this is partly related to his increased carbohydrate intake, more insulin and inadequate exercise  Recommendations:  He will try to adjust his suppertime dose based on carbohydrate intake and generally takes 7-8 units, may need more for 2 or more servings of carbohydrate or with higher fat intake  Discussed not to take extra insulin more than 2-3 units at bedtime  Increase Tresiba by at least 2 units to get morning sugars down   He will again try to get the  freestyle [employer] sensor, may need to get some help with getting the paperwork Trail started    2.  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  Will recheck this today since his sodium was only 128 on the last visit Discussed again reducing fluid intake  3. Hypertension: Fair control, somewhat anxious today He will continue to monitor at home  4.  Chronic anxiety He should be able to establish with his new PCP now for further prescriptions    Counseling time on subjects discussed above is over 50% of today's 25 minute visit     There are no Patient Instructions on file for this visit.   Elizzie Westergard 01/09/2017, 3:16  PM

## 2017-01-09 NOTE — Patient Instructions (Addendum)
I Unit for 10gm of Carbs per meal  Take 6-7 at supper Novolog  High sugar at bedtime: take only 2-3 Units extra  Tresiiba 44 units

## 2017-01-24 ENCOUNTER — Other Ambulatory Visit: Payer: Self-pay

## 2017-01-24 MED ORDER — FREESTYLE LIBRE READER DEVI: 1.0000 | Freq: Every day | 1 refills | Status: DC

## 2017-01-24 MED ORDER — GLUCOSE BLOOD VI STRP: ORAL_STRIP | 2 refills | Status: DC

## 2017-01-24 MED ORDER — FREESTYLE LIBRE SENSOR SYSTEM MISC: 3 refills | Status: DC

## 2017-02-14 ENCOUNTER — Other Ambulatory Visit: Payer: Self-pay | Admitting: Endocrinology

## 2017-03-14 ENCOUNTER — Other Ambulatory Visit (INDEPENDENT_AMBULATORY_CARE_PROVIDER_SITE_OTHER): Payer: Medicare Other

## 2017-03-14 DIAGNOSIS — E871 Hypo-osmolality and hyponatremia: Secondary | ICD-10-CM | POA: Diagnosis not present

## 2017-03-14 DIAGNOSIS — E1065 Type 1 diabetes mellitus with hyperglycemia: Secondary | ICD-10-CM

## 2017-03-14 LAB — MICROALBUMIN / CREATININE URINE RATIO
Creatinine,U: 140.4 mg/dL
Microalb Creat Ratio: 2.3 mg/g (ref 0.0–30.0)
Microalb, Ur: 3.2 mg/dL — ABNORMAL HIGH (ref 0.0–1.9)

## 2017-03-14 LAB — COMPREHENSIVE METABOLIC PANEL
ALT: 7 U/L (ref 0–53)
AST: 13 U/L (ref 0–37)
Albumin: 3.9 g/dL (ref 3.5–5.2)
Alkaline Phosphatase: 60 U/L (ref 39–117)
BILIRUBIN TOTAL: 0.6 mg/dL (ref 0.2–1.2)
BUN: 16 mg/dL (ref 6–23)
CO2: 32 meq/L (ref 19–32)
CREATININE: 0.96 mg/dL (ref 0.40–1.50)
Calcium: 9.3 mg/dL (ref 8.4–10.5)
Chloride: 94 mEq/L — ABNORMAL LOW (ref 96–112)
GFR: 85.63 mL/min (ref >60.00)
GLUCOSE: 178 mg/dL — AB (ref 70–99)
Potassium: 4.5 mEq/L (ref 3.5–5.1)
Sodium: 130 mEq/L — ABNORMAL LOW (ref 135–145)
Total Protein: 6.7 g/dL (ref 6.0–8.3)

## 2017-03-14 LAB — TSH: TSH: 1.75 u[IU]/mL (ref 0.35–4.50)

## 2017-03-14 LAB — HEMOGLOBIN A1C: HEMOGLOBIN A1C: 7.4 % — AB (ref 4.6–6.5)

## 2017-03-14 LAB — T4, FREE: FREE T4: 1.04 ng/dL (ref 0.60–1.60)

## 2017-03-16 ENCOUNTER — Ambulatory Visit (INDEPENDENT_AMBULATORY_CARE_PROVIDER_SITE_OTHER): Payer: Medicare Other | Admitting: Endocrinology

## 2017-03-16 ENCOUNTER — Telehealth: Payer: Self-pay | Admitting: Endocrinology

## 2017-03-16 ENCOUNTER — Encounter: Payer: Self-pay | Admitting: Endocrinology

## 2017-03-16 VITALS — BP 132/84 | HR 63 | Ht 70.0 in | Wt 151.2 lb

## 2017-03-16 DIAGNOSIS — I10 Essential (primary) hypertension: Secondary | ICD-10-CM

## 2017-03-16 DIAGNOSIS — E871 Hypo-osmolality and hyponatremia: Secondary | ICD-10-CM

## 2017-03-16 DIAGNOSIS — E1065 Type 1 diabetes mellitus with hyperglycemia: Secondary | ICD-10-CM | POA: Diagnosis not present

## 2017-03-16 MED ORDER — FREESTYLE LIBRE SENSOR SYSTEM MISC
3 refills | Status: DC
Start: 2017-03-16 — End: 2017-06-19

## 2017-03-16 MED ORDER — FREESTYLE LIBRE READER DEVI: 1.0000 | Freq: Every day | 1 refills | Status: DC

## 2017-03-16 NOTE — Patient Instructions (Signed)
Take 44 Tresiba at dinner  Take only 3-4 Novolog for cereal or else eat eggs/toast

## 2017-03-16 NOTE — Telephone Encounter (Signed)
MEDICATION: One Touch Test Strips  PHARMACY:  [facility] [phone]#[id number] [address] [place]    IS THIS A 90 DAY SUPPLY : no  IS PATIENT OUT OF MEDICTAION:  no  IF NOT; HOW MUCH IS LEFT: 14 strips left (a day or two left)   LAST APPOINTMENT DATE: Tuesday the 7th for labs  NEXT APPOINTMENT DATE: today 08/09 to see Lucianne MussKumar  OTHER COMMENTS:  Currently had One Touch meter   **Let patient know to contact pharmacy at the end of the day to make sure medication is ready. **  ** Please notify patient to allow 48-72 hours to process**  **Encourage patient to contact the pharmacy for refills or they can [facility]**

## 2017-03-16 NOTE — Progress Notes (Signed)
Patient ID: Peter Mendez., male   DOB: 28-Jun-1961, 56 y.o.   MRN: [id number]   Reason for Appointment: Diabetes follow-up   History of Present Illness   Diagnosis: Type 1 diabetes mellitus, diagnosis 1991.  He has been on basal bolus insulin regimen for a few years His A1c previously was consistently around 7-7.5 He tends to have significant fluctuation in blood sugars at all times. He has difficulty keeping his postprandial blood sugars consistent with mealtime insulin coverage and estimating the amount of insulin needed for various kinds of foods since he does not count carbohydrates A1c has been higher since 2014  Recent history:   The insulin regimen is [place] 42 units daily in am, before meals Novolog 6-8 usually  He was switched from twice a day Lantus to [place] in 4/18  A1c is 7.4, was 7.9  Current problems and blood sugar patterns  His fasting blood sugars are now running mostly high especially recently even though his average blood sugar overnight and early morning is about 160-170  He continues to have variable blood sugars throughout the day at all times with standard deviation 68  His most variable blood sugars appear to be around lunchtime  More recently he is eating cereal in the morning instead of oatmeal or other types of meals and his blood sugars are low normal or low between 9 AM and lunchtime  This is when he is having the most frequent hypoglycemic events with about 15% of the readings being below 70 around those times  HIGHEST blood sugars are either at bedtime or overnight  However blood sugars after evening meal are not consistently high  Still not counting carbohydrates such to calculate his insulin; taking about the same amount of insulin for most meals  [name] trying to be somewhat more active except when it is excessively hot  [name] still not able to get the freestyle Libre sensor and difficult to know how his insurance wants this to  be provided  Blood Glucose readings: From monitor download as below  Mean values apply above for all meters except median for One Touch  PRE-MEAL Fasting Lunch Dinner Bedtime Overall  Glucose range:  73-289     59-235    Mean/median: 167  132  152  196  158   POST-MEAL PC Breakfast PC Lunch PC Dinner  Glucose range:   52-3 28   Mean/median:  166  155      Hypoglycemia awareness: Usually develops symptoms when blood glucose is less than 50, usually sweating.   Meals: Inconsistent schedule, Inconsistent quantity. Breakfast is at 6-7 am Supper 6-7 pm  Variable intake at breakfast; 1/2 sandwich or peanut butter crackers at bedtime  Physical activity: exercise: some walking     Dietician visit: Most recent:, 4/09.   Wt Readings from Last 3 Encounters:  03/16/17 151 lb 3.2 oz (68.6 kg)  01/09/17 160 lb 12.8 oz (72.9 kg)  11/21/16 154 lb (69.9 kg)    Lab Results  Component Value Date   HGBA1C 7.4 (H) 03/14/2017   HGBA1C 7.9 (H) 11/17/2016   HGBA1C 7.5 (H) 08/18/2016   Lab Results  Component Value Date   MICROALBUR 3.2 (H) 03/14/2017   LDLCALC 81 08/18/2016   CREATININE 0.96 03/14/2017    Other active problems: See review of systems     Allergies as of 03/16/2017      Reactions   Pravastatin Sodium    myalgia  Medication List       Accurate as of 03/16/17  8:56 PM. Always use your most recent med list.          alprazolam 2 MG tablet Commonly known as:  XANAX take 1 tablet by mouth twice a day if needed for anxiety   FREESTYLE LIBRE READER Devi 1 Device by Does not apply route daily. Use to check blood sugars multiple times daily   FREESTYLE LIBRE SENSOR SYSTEM Misc Use one sensor for 10 days   gabapentin 600 MG tablet Commonly known as:  NEURONTIN Take 1 tablet 3 times a day as needed   glucose blood test strip Use to check blood sugars 7 times per day dx code E10.41   glucose blood test strip Commonly known as:  FREESTYLE PRECISION NEO  TEST Use to check blood sugars up to 4 times daily with Freestyle Libre   ibuprofen 200 MG tablet Commonly known as:  ADVIL,MOTRIN Take 200 mg by mouth every 6 (six) hours as needed.   LANTUS SOLOSTAR 100 UNIT/ML Solostar Pen Generic drug:  Insulin Glargine Inject into the skin daily. Takes 36 units in am and 34 units at night   NOVOLOG FLEXPEN 100 UNIT/ML FlexPen Generic drug:  insulin aspart inject 6 to 8 units subcutaneously four times a day   onetouch ultrasoft lancets Use as instructed to check blood sugars 7 times per day   oxycodone 30 MG immediate release tablet Commonly known as:  ROXICODONE 15 mg every 4 (four) hours as needed.   PEN NEEDLES 31GX5/16" 31G X 8 MM Misc Use to check blood sugar 7 times per day   ramipril 5 MG capsule Commonly known as:  ALTACE take 1 capsule by mouth once daily   TRESIBA FLEXTOUCH 100 UNIT/ML Sopn FlexTouch Pen Generic drug:  insulin degludec Inject 42 Units into the skin every morning.   triamcinolone cream 0.1 % Commonly known as:  KENALOG Apply topically 2 (two) times daily.       Allergies:  Allergies  Allergen Reactions  . Pravastatin Sodium     myalgia    Past Medical History:  Diagnosis Date  . Chicken pox   . Claudication (HCC)   . Diabetes 1.5, managed as type 1 (HCC)   . Hypertension   . Low back pain   . Tobacco abuse     Past Surgical History:  Procedure Laterality Date  . CATARACT EXTRACTION Bilateral   . INTRAOCULAR LENS INSERTION Bilateral   . RETINAL DETACHMENT SURGERY Bilateral     Family History  Problem Relation Age of Onset  . Diabetes Mother   . Atrial fibrillation Mother   . Diabetes Maternal Uncle   . Diabetes Maternal Grandmother   . Heart attack Maternal Grandmother   . Esophageal cancer Maternal Grandfather   . Heart attack Father   . Stroke Father   . Arthritis Father   . Heart attack Sister        at age 62    Social History:  reports that he has been smoking Cigarettes.   He has been smoking about 0.50 packs per day. He has never used smokeless tobacco. He reports that he does not drink alcohol. His drug history is not on file.  Review of Systems   HYPERTENSION: Blood pressure is fair on ramipril, somewhat anxious today   Home BP 132-136  Lipids: LDL has been well controlled, not on any statin drugs   Lab Results  Component Value Date  CHOL 136 08/18/2016   HDL 45.40 08/18/2016   LDLCALC 81 08/18/2016   TRIG 50.0 08/18/2016   CHOLHDL 3 08/18/2016    HYPONATREMIA: Long-standing and asymptomatic. 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   Demeclocycline was prescribed previously but  not covered by insurance.  Cortrosyn stimulation test was also normal   Urinary sodium previously was 138 and U Osm 245  Again asymptomatic Sodium slightly better at 130, is trying to limit water intake compared to before and avoiding Gatorade, coffee another diet drinks as much  Thyroid functions are also normal   Lab Results  Component Value Date   CREATININE 0.96 03/14/2017   BUN 16 03/14/2017   NA 130 (L) 03/14/2017   K 4.5 03/14/2017   CL 94 (L) 03/14/2017   CO2 32 03/14/2017    Chronic back pain, taking narcotic medications as needed Up to every 4 hours Also followed by pain clinic regularly  Visual loss from retinopathy, followed by ophthalmologist  He has had symptoms of neuropathy, He will get shooting pains, treated with gabapentin;  last diabetic foot exam In 4/17  Anxiety: He takes Xanax regularly twice a day and  has been taking this for 20 years Has not had any difficulties with somnolence even with combining with pain medications Now getting this from PCP   Examination:   BP 132/84   Pulse 63   Ht 5\' 10"  (1.778 m)   Wt 151 lb 3.2 oz  (68.6 kg)   SpO2 98%   BMI 21.69 kg/m   Body mass index is 21.69 kg/m.   Diabetic Foot Exam - Simple   Simple Foot Form Visual Inspection No deformities, no ulcerations, no other skin breakdown bilaterally:  Yes Sensation Testing Intact to touch and monofilament testing bilaterally:  Yes See comments:  Yes Pulse Check See comments:  Yes Comments Onychomycosis of toes present Absent monofilament sensation on most of the toes and plantar surfaces distally Absent pedal pulses     Assesment/PLAN:  Diabetes type 1, long-standing with fair control  See history of present illness for detailed discussion of current blood sugar patterns, problems identified and current management  His blood sugars are again very erratic marked variability at all times as discussed above He tends to have high overnight readings and recently more consistently high fasting readings Although he does need more basal insulin he has not tried to increase this  His blood sugars however tend to be relatively lower before lunch and supper This may be related to increased activity levels, recently having only cereal without any protein in the morning and probably excessive NovoLog coverage for current intake He still not able to get the freestyle Libre sensor because of issues with his insurance and not clear what supplier the prefer  Recommendations:  He will try to adjust his suppertime dose based on meal size and may need 1-2 units more for larger meals with low carbohydrate  Discussed needing to reduce his coverage for breakfast and lunch especially with lighter meals  He needs to stop eating cereal and have more protein with breakfast  He will need to have more snacks when he is more active  Increase Tresiba by at least 2 units to get morning sugars down, he can go to 44 units currently and  add 1-2 units further if fasting readings are high in the next week or 2  He can try taking Tresiba at  dinnertime although this may not make a difference   He will again try to get the  freestyle Libre sensor, [facility]   2.  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  Slightly better now and encouraged him to continue restricting fluid intake  3. Hypertension: Fair control, continue same medications He will continue to monitor at home also  4.  NEUROPATHY: Discussed general principles of foot care and aortic visits with podiatrist    Counseling time on subjects discussed above is over 50% of today's 25 minute visit     Patient Instructions  Take 44 Tresiba at dinner  Take only 3-4 Novolog for cereal or else eat eggs/toast       Parag Dorton 03/16/2017, 8:56 PM

## 2017-03-17 ENCOUNTER — Other Ambulatory Visit: Payer: Self-pay

## 2017-03-17 MED ORDER — GLUCOSE BLOOD VI STRP: ORAL_STRIP | 5 refills | Status: DC

## 2017-03-17 MED ORDER — GLUCOSE BLOOD VI STRP: ORAL_STRIP | 3 refills | Status: DC

## 2017-03-17 NOTE — Telephone Encounter (Signed)
This has been ordered 

## 2017-03-20 ENCOUNTER — Other Ambulatory Visit: Payer: Self-pay

## 2017-03-20 MED ORDER — GLUCOSE BLOOD VI STRP: ORAL_STRIP | 5 refills | Status: DC

## 2017-03-21 ENCOUNTER — Telehealth: Payer: Self-pay

## 2017-03-21 NOTE — Telephone Encounter (Signed)
Spoke to the patient and he is aware that the freestyle [employer] Igolibre will not be covered and I advised him to get in touch with [employer] to see if they can work with him to get it covered

## 2017-06-13 ENCOUNTER — Other Ambulatory Visit (INDEPENDENT_AMBULATORY_CARE_PROVIDER_SITE_OTHER): Payer: Medicare Other

## 2017-06-13 DIAGNOSIS — E1065 Type 1 diabetes mellitus with hyperglycemia: Secondary | ICD-10-CM | POA: Diagnosis not present

## 2017-06-13 LAB — BASIC METABOLIC PANEL
BUN: 16 mg/dL (ref 6–23)
CHLORIDE: 91 meq/L — AB (ref 96–112)
CO2: 34 mEq/L — ABNORMAL HIGH (ref 19–32)
Calcium: 9.4 mg/dL (ref 8.4–10.5)
Creatinine, Ser: 0.98 mg/dL (ref 0.40–1.50)
GFR: 83.55 mL/min (ref >60.00)
Glucose, Bld: 291 mg/dL — ABNORMAL HIGH (ref 70–99)
POTASSIUM: 4.6 meq/L (ref 3.5–5.1)
SODIUM: 129 meq/L — AB (ref 135–145)

## 2017-06-13 LAB — LIPID PANEL
CHOL/HDL RATIO: 4
Cholesterol: 140 mg/dL (ref 0–200)
HDL: 36.7 mg/dL — ABNORMAL LOW (ref >39.00)
LDL Cholesterol: 93 mg/dL (ref 0–99)
NonHDL: 103.48
TRIGLYCERIDES: 54 mg/dL (ref 0.0–149.0)
VLDL: 10.8 mg/dL (ref 0.0–40.0)

## 2017-06-13 LAB — HEMOGLOBIN A1C: HEMOGLOBIN A1C: 7.4 % — AB (ref 4.6–6.5)

## 2017-06-16 ENCOUNTER — Ambulatory Visit: Payer: Medicare Other | Admitting: Endocrinology

## 2017-06-16 ENCOUNTER — Other Ambulatory Visit: Payer: Medicare Other

## 2017-06-19 ENCOUNTER — Encounter: Payer: Self-pay | Admitting: Endocrinology

## 2017-06-19 ENCOUNTER — Ambulatory Visit (INDEPENDENT_AMBULATORY_CARE_PROVIDER_SITE_OTHER): Payer: Medicare Other | Admitting: Endocrinology

## 2017-06-19 VITALS — BP 134/78 | HR 63 | Ht 70.0 in | Wt 147.8 lb

## 2017-06-19 DIAGNOSIS — E1065 Type 1 diabetes mellitus with hyperglycemia: Secondary | ICD-10-CM

## 2017-06-19 DIAGNOSIS — E871 Hypo-osmolality and hyponatremia: Secondary | ICD-10-CM | POA: Diagnosis not present

## 2017-06-19 DIAGNOSIS — I10 Essential (primary) hypertension: Secondary | ICD-10-CM

## 2017-06-19 MED ORDER — TRESIBA FLEXTOUCH 100 UNIT/ML ~~LOC~~ SOPN: 42.0000 [IU] | PEN_INJECTOR | SUBCUTANEOUS | 5 refills | Status: DC

## 2017-06-19 NOTE — Progress Notes (Signed)
Patient ID: Peter NewnessEarl T Sahagian Jr., male   DOB: 03/24/1961, 56 y.o.   MRN: [id number]   Reason for Appointment: Diabetes follow-up   History of Present Illness   Diagnosis: Type 1 diabetes mellitus, diagnosis 1991.  He has been on basal bolus insulin regimen for a few years His A1c previously was consistently around 7-7.5 He tends to have significant fluctuation in blood sugars at all times. He has difficulty keeping his postprandial blood sugars consistent with mealtime insulin coverage and estimating the amount of insulin needed for various kinds of foods since he does not count carbohydrates A1c has been higher since 2014  Recent history:   The insulin regimen is Tresiba (Lantus 42-34) 42 units daily in am, before meals Novolog 6-8 usually  He was switched from twice a day Lantus to [place] in 4/18  A1c is 7.4 as before   Current problems and blood sugar patterns  Recently had gone back to Lantus because he says his pharmacy did not have a prescription for Tresiba  His fasting blood sugars are relatively lower with occasional low sugars overnight also  However blood sugars are mostly higher before suppertime despite not having usually high readings after lunch except possibly recently  Also may have had more fluctuation in blood sugars with his having a respiratory infection in the last 2 weeks  Again he was tending to have sporadic low blood sugars at different times a day except bedtime  He shows significant VARIABILITY in blood sugar with standard deviation 66  Overall blood sugars are however not as high at lunchtime usually  He does try to adjust his insulin at meals based on what he is eating but usually taking similar doses  Is still having difficulty getting coverage or ability to order the freestyle Libre system   Blood Glucose readings: From monitor download as below. [name] checking his blood sugar 7 times or more per day in the last 30 days  Mean values  apply above for all meters except median for One Touch  PRE-MEAL Fasting Lunch Dinner Bedtime Overall  Glucose range:  37-204    99-284    Mean/median: 156 131  170  175 156 +/-66    POST-MEAL PC Breakfast PC Lunch PC Dinner  Glucose range:  54-296    Mean/median:  171       Hypoglycemia awareness: Usually develops symptoms when blood glucose is less than 50, usually sweating.   Meals: Inconsistent schedule, Inconsistent quantity. Breakfast is at 6-7 am Supper 6-7 pm  Variable intake at breakfast; 1/2 sandwich or peanut butter crackers at bedtime  Physical activity: exercise: some walking, none recently     Dietician visit: Most recent:, 4/09.   Wt Readings from Last 3 Encounters:  06/19/17 147 lb 12.8 oz (67 kg)  03/16/17 151 lb 3.2 oz (68.6 kg)  01/09/17 160 lb 12.8 oz (72.9 kg)    Lab Results  Component Value Date   HGBA1C 7.4 (H) 06/13/2017   HGBA1C 7.4 (H) 03/14/2017   HGBA1C 7.9 (H) 11/17/2016   Lab Results  Component Value Date   MICROALBUR 3.2 (H) 03/14/2017   LDLCALC 93 06/13/2017   CREATININE 0.98 06/13/2017    Other active problems: See review of systems     Allergies as of 06/19/2017      Reactions   Pravastatin Sodium    myalgia      Medication List        Accurate as of 06/19/17  8:39 AM. Always use your most recent med list.          alprazolam 2 MG tablet Commonly known as:  XANAX take 1 tablet by mouth twice a day if needed for anxiety   gabapentin 600 MG tablet Commonly known as:  NEURONTIN Take 1 tablet 3 times a day as needed   glucose blood test strip Use to check blood sugars 7 times per day dx code E10.41   glucose blood test strip Use to check blood sugars 7 times per day dx code E10.41   glucose blood test strip Commonly known as:  ONE TOUCH ULTRA TEST Use to check blood sugars 7 times per day dx code E10.41   ibuprofen 200 MG tablet Commonly known as:  ADVIL,MOTRIN Take 200 mg by mouth every 6 (six) hours as  needed.   NOVOLOG FLEXPEN 100 UNIT/ML FlexPen Generic drug:  insulin aspart inject 6 to 8 units subcutaneously four times a day   onetouch ultrasoft lancets Use as instructed to check blood sugars 7 times per day   oxycodone 30 MG immediate release tablet Commonly known as:  ROXICODONE 15 mg every 4 (four) hours as needed.   PEN NEEDLES 31GX5/16" 31G X 8 MM Misc Use to check blood sugar 7 times per day   ramipril 5 MG capsule Commonly known as:  ALTACE take 1 capsule by mouth once daily   TRESIBA FLEXTOUCH 100 UNIT/ML Sopn FlexTouch Pen Generic drug:  insulin degludec Inject 0.42 mLs (42 Units total) every morning into the skin.   triamcinolone cream 0.1 % Commonly known as:  KENALOG Apply topically 2 (two) times daily.       Allergies:  Allergies  Allergen Reactions  . Pravastatin Sodium     myalgia    Past Medical History:  Diagnosis Date  . Chicken pox   . Claudication (HCC)   . Diabetes 1.5, managed as type 1 (HCC)   . Hypertension   . Low back pain   . Tobacco abuse     Past Surgical History:  Procedure Laterality Date  . CATARACT EXTRACTION Bilateral   . INTRAOCULAR LENS INSERTION Bilateral   . RETINAL DETACHMENT SURGERY Bilateral     Family History  Problem Relation Age of Onset  . Diabetes Mother   . Atrial fibrillation Mother   . Diabetes Maternal Uncle   . Diabetes Maternal Grandmother   . Heart attack Maternal Grandmother   . Esophageal cancer Maternal Grandfather   . Heart attack Father   . Stroke Father   . Arthritis Father   . Heart attack Sister        at age 57    Social History:  reports that he has been smoking cigarettes.  He has been smoking about 0.50 packs per day. he has never used smokeless tobacco. He reports that he does not drink alcohol. His drug history is not on file.  Review of Systems   HYPERTENSION: Blood pressure is fair on ramipril, somewhat anxious today   Home BP 132-136  Lipids: LDL has been well  controlled, not on any statin drugs   Lab Results  Component Value Date   CHOL 140 06/13/2017   HDL 36.70 (L) 06/13/2017   LDLCALC 93 06/13/2017   TRIG 54.0 06/13/2017   CHOLHDL 4 06/13/2017    HYPONATREMIA: Long-standing and asymptomatic. 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   Demeclocycline was prescribed previously but  not covered by insurance.  Cortrosyn stimulation test was also normal   Urinary sodium previously was 138 and U Osm 245  Again asymptomatic without any nausea or decreased appetite Sodium slightly lowered 129 He says that he is drinking more Gatorade recently because of his respiratory infection  Thyroid functions are also normal   Lab Results  Component Value Date   CREATININE 0.98 06/13/2017   BUN 16 06/13/2017   NA 129 (L) 06/13/2017   K 4.6 06/13/2017   CL 91 (L) 06/13/2017   CO2 34 (H) 06/13/2017    Chronic back pain, taking narcotic medications as needed upto every 4 hours Also followed by pain clinic regularly  Visual loss from retinopathy, followed by ophthalmologist  He has had symptoms of neuropathy, He will get shooting pains, treated with gabapentin   Anxiety: He takes Xanax regularly twice a day and  has been taking this for 20 years Has not had any difficulties with somnolence even with combining with pain medications  He has had fairly regular follow-up with his PCP now   Examination:   BP 134/78   Pulse 63   Ht 5\' 10"  (1.778 m)   Wt 147 lb 12.8 oz (67 kg)   SpO2 94%   BMI 21.21 kg/m   Body mass index is 21.21 kg/m.     Assesment/PLAN:  Diabetes type 1, long-standing with fair control  See history of present illness for detailed discussion of current blood sugar patterns, problems identified and current  management  His blood sugars are generally well controlled but still showing been consistent patterns His highest blood sugars are more likely to be before suppertime especially with recent respiratory infection [name] again having difficulty getting his Evaristo Buryresiba consistently from the pharmacy With Lantus his blood sugars are tending to be low overnight and relatively higher at suppertime He still not able to get the freestyle Libre sensor because of issues with his insurance and mail-order supply is not filing for his insurance coverage   New prescription for Evaristo Buryresiba has been sent, discussed that this will keep his sugars more consistently controlled with lower doses and wants a day regimen  He was started 42 units again  Meanwhile he'll need to reduce his evening Lantus and increase the morning dosage as discussed  Probably need higher doses of NovoLog at lunchtime which he is not consistently doing  He will also try to get his insurance to help him decide where he can get the freestyle Libre from    2.  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   [name] not symptomatic even with a sodium level of 129 now, recently lower levels may be from his increased fluid intake Reminded him to start restricting fluid intake and not drinking excessive amounts of Gatorade  3. Hypertension: well-controlled   LIPIDS: These are well controlled  He will get his influenza vaccine couple of weeks as he is still recovering from his URI  Counseling time on subjects discussed in assessment and plan sections is over 50% of today's 25 minute visit       Patient Instructions  Lantus 45 in am AND PM IS 30      Peter Mendez 06/19/2017, 8:39 AM

## 2017-06-19 NOTE — Patient Instructions (Addendum)
Lantus 45 in am AND PM IS 30

## 2017-06-23 ENCOUNTER — Other Ambulatory Visit: Payer: Self-pay | Admitting: Endocrinology

## 2017-07-25 ENCOUNTER — Other Ambulatory Visit: Payer: Self-pay | Admitting: Endocrinology

## 2017-07-25 ENCOUNTER — Telehealth: Payer: Self-pay | Admitting: Endocrinology

## 2017-07-25 NOTE — Telephone Encounter (Signed)
Refill of NOVOLOG FLEXPEN 100 UNIT/ML FlexPen [[id number]]  Send to  Pharmacy:  [facility] [address] - [place], [place] - [address] DEA #:  [id number]

## 2017-08-23 ENCOUNTER — Other Ambulatory Visit: Payer: Self-pay

## 2017-08-23 MED ORDER — INSULIN ASPART 100 UNIT/ML FLEXPEN: PEN_INJECTOR | SUBCUTANEOUS | 2 refills | Status: DC

## 2017-08-23 NOTE — Telephone Encounter (Signed)
This has been done.

## 2017-08-23 NOTE — Telephone Encounter (Signed)
Pt needs refill sent to pharmacy      NOVOLOG FLEXPEN 100 UNIT/ML FlexPen    [facility] [address] - [place], [place] - [address]

## 2017-09-18 NOTE — Progress Notes (Signed)
Patient ID: Peter Mendez., male   DOB: 08/28/1960, 57 y.o.   MRN: [id number]   Reason for Appointment: Diabetes follow-up   History of Present Illness   Diagnosis: Type 1 diabetes mellitus, diagnosis 1991.  He has been on basal bolus insulin regimen for a few years His A1c previously was consistently around 7-7.5 He tends to have significant fluctuation in blood sugars at all times. He has difficulty keeping his postprandial blood sugars consistent with mealtime insulin coverage and estimating the amount of insulin needed for various kinds of foods since he does not count carbohydrates A1c has been higher since 2014  Recent history:   The insulin regimen is  (Lantus 36-34)  before meals Novolog 6-8 usually  He was switched from twice a day Lantus to [place] in 4/18 however he has gone back to [place]iba for some time because he thought he was having a rash on his upper back from this  A1c is 8.2, previously 7.4  Current problems and blood sugar patterns  Thinks his blood sugars are overall higher because of excessive snacks with foods like ice cream  [name] not always covering his snacks with extra insulin and also not clear if she is taking extra insulin for higher blood sugars  More recently has started cutting back on his sweets and blood sugars appear to be relatively lower  HIGHEST blood sugars are late at night and overnight on an average  FASTING blood sugars are quite variable but recently somewhat lower  With taking Lantus twice a day he appears to have relatively high readings before dinner time but also around lunchtime  Not clear why his blood sugars maybe higher during the night sometimes, he tends to take some extra insulin to bring it down and couple of times he has had low sugars because of this  Otherwise has occasional hypoglycemia, usually late morning or midday  Is not able to order the freestyle BotinesLibre system because of insurance denial   Blood  Glucose readings: From monitor download as below. [name] checking his blood sugar 7 times or more per day in the last 30 days  Mean values apply above for all meters except median for One Touch  PRE-MEAL Fasting Lunch Dinner Overnight  Overall  Glucose range:  98-341   70-265    Mean/median: 180 193  184  200  189   POST-MEAL PC Breakfast PC Lunch PC Dinner  Glucose range: 51-320     Mean/median:  199      Hypoglycemia awareness: Usually develops symptoms when blood glucose is less than 50, usually sweating.   Meals: Inconsistent schedule, Inconsistent quantity. Breakfast is at 6-7 am Supper 6-7 pm  Variable intake at breakfast; 1/2 sandwich or peanut butter crackers at bedtime  Physical activity: exercise: some walking, none recently     Dietician visit: Most recent:, 4/09.   Wt Readings from Last 3 Encounters:  09/19/17 151 lb 9.6 oz (68.8 kg)  06/19/17 147 lb 12.8 oz (67 kg)  03/16/17 151 lb 3.2 oz (68.6 kg)    Lab Results  Component Value Date   HGBA1C 8.2 09/19/2017   HGBA1C 7.4 (H) 06/13/2017   HGBA1C 7.4 (H) 03/14/2017   Lab Results  Component Value Date   MICROALBUR 3.2 (H) 03/14/2017   LDLCALC 93 06/13/2017   CREATININE 0.98 06/13/2017    Other active problems: See review of systems     Allergies as of 09/19/2017  Reactions   Pravastatin Sodium    myalgia      Medication List        Accurate as of 09/19/17  2:34 PM. Always use your most recent med list.          alprazolam 2 MG tablet Commonly known as:  XANAX take 1 tablet by mouth twice a day if needed for anxiety   gabapentin 600 MG tablet Commonly known as:  NEURONTIN Take 1 tablet 3 times a day as needed   glucose blood test strip Commonly known as:  ONE TOUCH ULTRA TEST Use to check blood sugars 7 times per day dx code E10.41   ibuprofen 200 MG tablet Commonly known as:  ADVIL,MOTRIN Take 200 mg by mouth every 6 (six) hours as needed.   insulin aspart 100 UNIT/ML  FlexPen Commonly known as:  NOVOLOG FLEXPEN inject 6 to 8 units subcutaneously four times a day   insulin degludec 100 UNIT/ML Sopn FlexTouch Pen Commonly known as:  TRESIBA FLEXTOUCH Inject 42 units daily.   Insulin Pen Needle 31G X 8 MM Misc Commonly known as:  B-D ULTRAFINE III SHORT PEN USE TO INJECT INSULIN 5 TIMES A DAY   LANTUS SOLOSTAR 100 UNIT/ML Solostar Pen Generic drug:  Insulin Glargine inject 36 units subcutaneously every morning and 34 units every evening   onetouch ultrasoft lancets Use as instructed to check blood sugars 7 times per day   oxycodone 30 MG immediate release tablet Commonly known as:  ROXICODONE 15 mg every 4 (four) hours as needed.   ramipril 5 MG capsule Commonly known as:  ALTACE take 1 capsule by mouth once daily   triamcinolone cream 0.1 % Commonly known as:  KENALOG Apply topically 2 (two) times daily.       Allergies:  Allergies  Allergen Reactions  . Pravastatin Sodium     myalgia    Past Medical History:  Diagnosis Date  . Chicken pox   . Claudication (HCC)   . Diabetes 1.5, managed as type 1 (HCC)   . Hypertension   . Low back pain   . Tobacco abuse     Past Surgical History:  Procedure Laterality Date  . CATARACT EXTRACTION Bilateral   . INTRAOCULAR LENS INSERTION Bilateral   . RETINAL DETACHMENT SURGERY Bilateral     Family History  Problem Relation Age of Onset  . Diabetes Mother   . Atrial fibrillation Mother   . Diabetes Maternal Uncle   . Diabetes Maternal Grandmother   . Heart attack Maternal Grandmother   . Esophageal cancer Maternal Grandfather   . Heart attack Father   . Stroke Father   . Arthritis Father   . Heart attack Sister        at age 46    Social History:  reports that he has been smoking cigarettes.  He has been smoking about 0.50 packs per day. he has never used smokeless tobacco. He reports that he does not drink alcohol. His drug history is not on file.  Review of Systems    HYPERTENSION: Blood pressure is fairly good today, taking ramipril as before  Lipids: LDL has been well controlled, not on any statin drugs   Lab Results  Component Value Date   CHOL 140 06/13/2017   HDL 36.70 (L) 06/13/2017   LDLCALC 93 06/13/2017   TRIG 54.0 06/13/2017   CHOLHDL 4 06/13/2017    HYPONATREMIA: Long-standing and asymptomatic. 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   Demeclocycline was prescribed previously but  not covered by insurance.  Cortrosyn stimulation test was also normal   Urinary sodium previously was 138 and U Osm 245  Again asymptomatic without any nausea or decreased appetite He has cut back on fluids such as Gatorade recently  Thyroid functions are  normal   Lab Results  Component Value Date   CREATININE 0.98 06/13/2017   BUN 16 06/13/2017   NA 129 (L) 06/13/2017   K 4.6 06/13/2017   CL 91 (L) 06/13/2017   CO2 34 (H) 06/13/2017    Chronic back pain, taking narcotic medications as needed upto every 4 hours, he says he is cutting back on the doses now  Visual loss from retinopathy, followed by ophthalmologist  He has had symptoms of neuropathy, including shooting pains, treated with gabapentin  Anxiety: He takes Xanax regularly twice a day and  has been taking this for 20 years Now prescribed by his PCP       Examination:   BP 138/70 (BP Location: Left Arm, Patient Position: Sitting, Cuff Size: Normal)   Pulse (!) 56   Ht 5\' 10"  (1.778 m)   Wt 151 lb 9.6 oz (68.8 kg)   SpO2 98%   BMI 21.75 kg/m   Body mass index is 21.75 kg/m.     Assesment/PLAN:  Diabetes type 1, long-standing with fair control  See history of present illness for detailed discussion of current blood sugar patterns, problems identified and current  management  His A1c is higher than usual at 8.2  Also having more fluctuation and inconsistent blood sugars This may partly related to his trying Lantus again instead of [place] Although recently fasting blood sugars are better he is having higher readings at lunch and dinnertime probably indicating the need for increase basal insulin Also has had hyperglycemia related to getting more snacks and sweets without adequate coverage with NovoLog Currently also not very active and not able to exercise He not able to get the freestyle [place] sensor because of issues with his insurance    New prescription for Evaristo Bury has been sent  Meanwhile he can increase his Lantus by 2 units in the morning and reduce evening dose by 2 units  He does go back to TRESIBA 42 units again  He needs to take extra Novolog for snacks with carbohydrate  Also adjust doses based on pre-meal blood sugars  Discussed that he needs to avoid taking extra Novolog insulin in the middle of the night unless his having a snack  Prior authorization for freestyle [name] will be started, he is injecting insulin 5 times a day and adjusting the dose based on his pre-meal blood sugar and should not have any denial for Medicare plans   2.  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  Need to follow-up today    3. Hypertension: well-controlled     Counseling time on subjects discussed in assessment and plan sections is over 50% of today's 25 minute visit       Patient Instructions  Lantus 38 in am and 32 in pm  Change to TResiba 42 units daily  Take extra 2-5 units for large snacks    Reather Littler 09/19/2017, 2:34 PM

## 2017-09-19 ENCOUNTER — Other Ambulatory Visit: Payer: Self-pay

## 2017-09-19 ENCOUNTER — Encounter: Payer: Self-pay | Admitting: Endocrinology

## 2017-09-19 ENCOUNTER — Ambulatory Visit (INDEPENDENT_AMBULATORY_CARE_PROVIDER_SITE_OTHER): Payer: Medicare Other | Admitting: Endocrinology

## 2017-09-19 VITALS — BP 138/70 | HR 56 | Ht 70.0 in | Wt 151.6 lb

## 2017-09-19 DIAGNOSIS — I10 Essential (primary) hypertension: Secondary | ICD-10-CM | POA: Diagnosis not present

## 2017-09-19 DIAGNOSIS — E1049 Type 1 diabetes mellitus with other diabetic neurological complication: Secondary | ICD-10-CM

## 2017-09-19 DIAGNOSIS — E871 Hypo-osmolality and hyponatremia: Secondary | ICD-10-CM | POA: Diagnosis not present

## 2017-09-19 DIAGNOSIS — E1065 Type 1 diabetes mellitus with hyperglycemia: Secondary | ICD-10-CM | POA: Diagnosis not present

## 2017-09-19 DIAGNOSIS — IMO0002 Reserved for concepts with insufficient information to code with codable children: Secondary | ICD-10-CM

## 2017-09-19 LAB — BASIC METABOLIC PANEL
BUN: 9 mg/dL (ref 6–23)
CHLORIDE: 95 meq/L — AB (ref 96–112)
CO2: 33 meq/L — AB (ref 19–32)
Calcium: 8.8 mg/dL (ref 8.4–10.5)
Creatinine, Ser: 0.83 mg/dL (ref 0.40–1.50)
GFR: 101.11 mL/min (ref >60.00)
Glucose, Bld: 112 mg/dL — ABNORMAL HIGH (ref 70–99)
Potassium: 4.7 mEq/L (ref 3.5–5.1)
SODIUM: 132 meq/L — AB (ref 135–145)

## 2017-09-19 LAB — POCT GLYCOSYLATED HEMOGLOBIN (HGB A1C): Hemoglobin A1C: 8.2

## 2017-09-19 MED ORDER — INSULIN DEGLUDEC 100 UNIT/ML ~~LOC~~ SOPN: PEN_INJECTOR | SUBCUTANEOUS | 3 refills | Status: DC

## 2017-09-19 MED ORDER — INSULIN PEN NEEDLE 31G X 8 MM MISC: 3 refills | Status: DC

## 2017-09-19 MED ORDER — GABAPENTIN 600 MG PO TABS: ORAL_TABLET | ORAL | 3 refills | Status: DC

## 2017-09-19 MED ORDER — GLUCOSE BLOOD VI STRP: ORAL_STRIP | 3 refills | Status: DC

## 2017-09-19 MED ORDER — INSULIN ASPART 100 UNIT/ML FLEXPEN: PEN_INJECTOR | SUBCUTANEOUS | 2 refills | Status: DC

## 2017-09-19 NOTE — Patient Instructions (Addendum)
Lantus 38 in am and 32 in pm  Change to TResiba 42 units daily  Take extra 2-5 units for large snacks

## 2017-09-21 ENCOUNTER — Telehealth: Payer: Self-pay

## 2017-09-21 NOTE — Telephone Encounter (Signed)
Pt called he is aware of the sodium level

## 2017-09-21 NOTE — Telephone Encounter (Signed)
Called pt. Not available. Left message w/ mother to rtn call.

## 2017-09-21 NOTE — Telephone Encounter (Signed)
-----   Message from Reather LittlerAjay Kumar, MD sent at 09/20/2017  9:34 PM EST ----- Sodium better at 132

## 2017-12-18 ENCOUNTER — Ambulatory Visit: Payer: Medicare Other | Admitting: Endocrinology

## 2017-12-20 ENCOUNTER — Other Ambulatory Visit: Payer: Self-pay

## 2017-12-20 MED ORDER — INSULIN ASPART 100 UNIT/ML FLEXPEN: PEN_INJECTOR | SUBCUTANEOUS | 2 refills | Status: DC

## 2017-12-27 ENCOUNTER — Other Ambulatory Visit: Payer: Self-pay

## 2017-12-27 ENCOUNTER — Encounter: Payer: Self-pay | Admitting: Endocrinology

## 2017-12-27 ENCOUNTER — Ambulatory Visit (INDEPENDENT_AMBULATORY_CARE_PROVIDER_SITE_OTHER): Payer: Medicare Other | Admitting: Endocrinology

## 2017-12-27 VITALS — BP 180/80 | HR 60 | Ht 70.0 in | Wt 145.2 lb

## 2017-12-27 DIAGNOSIS — E871 Hypo-osmolality and hyponatremia: Secondary | ICD-10-CM

## 2017-12-27 DIAGNOSIS — IMO0002 Reserved for concepts with insufficient information to code with codable children: Secondary | ICD-10-CM

## 2017-12-27 DIAGNOSIS — E1065 Type 1 diabetes mellitus with hyperglycemia: Secondary | ICD-10-CM

## 2017-12-27 DIAGNOSIS — I10 Essential (primary) hypertension: Secondary | ICD-10-CM | POA: Diagnosis not present

## 2017-12-27 DIAGNOSIS — E1049 Type 1 diabetes mellitus with other diabetic neurological complication: Secondary | ICD-10-CM

## 2017-12-27 DIAGNOSIS — E1042 Type 1 diabetes mellitus with diabetic polyneuropathy: Secondary | ICD-10-CM | POA: Diagnosis not present

## 2017-12-27 LAB — POCT GLYCOSYLATED HEMOGLOBIN (HGB A1C): Hemoglobin A1C: 7.7 % — AB (ref 4.0–5.6)

## 2017-12-27 MED ORDER — GLUCOSE BLOOD VI STRP: ORAL_STRIP | 12 refills | Status: DC

## 2017-12-27 NOTE — Progress Notes (Signed)
Patient ID: Irma Newness., male   DOB: 04-26-1961, 58 y.o.   MRN: [id number]   Reason for Appointment: Diabetes follow-up   History of Present Illness   Diagnosis: Type 1 diabetes mellitus, diagnosis 1991.  He has been on basal bolus insulin regimen for a few years His A1c previously was consistently around 7-7.5 He tends to have significant fluctuation in blood sugars at all times. He has difficulty keeping his postprandial blood sugars consistent with mealtime insulin coverage and estimating the amount of insulin needed for various kinds of foods since he does not count carbohydrates A1c has been higher since 2014  Recent history:   The insulin regimen is  [place] 42 units daily  before meals Novolog 4-8 usually  He has been consistent on his Evaristo Bury since his last visit  A1c is 7.7, previously 8.2  Current problems and blood sugar patterns  Patient thinks that he has had tendency to low readings with [place] but has not changed the dose  Also documented hypoglycemia  has been only sporadic in the last couple of weeks  Also because of lack of insurance coverage for his One Touch test strips he has not monitored as frequently in the last week or so  Again blood sugars have been VARIABLE over the last month  However it appears that his blood sugars are not quite as high consistently in the last week or so  Most consistent with HYPERGLYCEMIA is around lunchtime  Although he normally takes 6 to 8 units coverage for his meals he is only taking 4 units in the morning for breakfast which may be oatmeal about half a time  Did not adjust his mealtime dose based on meal size consistently and he still has some high readings after lunch and supper  FASTING readings have been extremely variable including overnight readings but recently most of these are near normal or low  Is not able to order the freestyle [place] system because of insurance denial   Blood Glucose  readings: From monitor download as below. [name] checking his blood sugar 5 times or more per day in the last 30 days  Mean values apply above for all meters except median for One Touch  PRE-MEAL Fasting Lunch Dinner Bedtime Overall  Glucose range:  49-290  132-344    42-469  Mean/median:  177  213  217  188 +/-69   POST-MEAL PC Breakfast PC Lunch PC Dinner  Glucose range:    63-308  Mean/median:    182   Time in range        %  % Time Above 140  74  % Time above 250   % Time Below target  4.6    Hypoglycemia awareness: Usually develops symptoms when blood glucose is less than 50, usually sweating.   Meals: Inconsistent schedule, Inconsistent quantity. Breakfast is at 6-7 am Supper 6-7 pm  Variable intake at breakfast, some oatmeal; 1/2 sandwich or peanut butter crackers at bedtime  Physical activity: exercise:  none recently     Dietician visit: Most recent:, 4/09.   Wt Readings from Last 3 Encounters:  12/27/17 145 lb 3.2 oz (65.9 kg)  09/19/17 151 lb 9.6 oz (68.8 kg)  06/19/17 147 lb 12.8 oz (67 kg)    Lab Results  Component Value Date   HGBA1C 7.8 (H) 12/27/2017   HGBA1C 7.7 (A) 12/27/2017   HGBA1C 8.2 09/19/2017   Lab Results  Component Value Date   MICROALBUR  3.2 (H) 03/14/2017   LDLCALC 93 06/13/2017   CREATININE 0.95 12/27/2017    Other active problems: See review of systems     Allergies as of 12/27/2017      Reactions   Pravastatin Sodium    myalgia      Medication List        Accurate as of 12/27/17 11:59 PM. Always use your most recent med list.          alprazolam 2 MG tablet Commonly known as:  XANAX take 1 tablet by mouth twice a day if needed for anxiety   gabapentin 600 MG tablet Commonly known as:  NEURONTIN Take 1 tablet 3 times a day as needed   glucose blood test strip Commonly known as:  ONE TOUCH ULTRA TEST Use to check blood sugars 7 times per day dx code E10.41   glucose blood test strip Commonly known as:  ACCU-CHEK  GUIDE Use as instructed   ibuprofen 200 MG tablet Commonly known as:  ADVIL,MOTRIN Take 200 mg by mouth every 6 (six) hours as needed.   insulin aspart 100 UNIT/ML FlexPen Commonly known as:  NOVOLOG FLEXPEN inject 6 to 8 units subcutaneously four times a day   insulin degludec 100 UNIT/ML Sopn FlexTouch Pen Commonly known as:  TRESIBA FLEXTOUCH Inject 42 units daily.   Insulin Pen Needle 31G X 8 MM Misc Commonly known as:  B-D ULTRAFINE III SHORT PEN USE TO INJECT INSULIN 5 TIMES A DAY   onetouch ultrasoft lancets Use as instructed to check blood sugars 7 times per day   oxycodone 30 MG immediate release tablet Commonly known as:  ROXICODONE 15 mg every 4 (four) hours as needed.   ramipril 5 MG capsule Commonly known as:  ALTACE take 1 capsule by mouth once daily   triamcinolone cream 0.1 % Commonly known as:  KENALOG Apply topically 2 (two) times daily.       Allergies:  Allergies  Allergen Reactions  . Pravastatin Sodium     myalgia    Past Medical History:  Diagnosis Date  . Chicken pox   . Claudication (HCC)   . Diabetes 1.5, managed as type 1 (HCC)   . Hypertension   . Low back pain   . Tobacco abuse     Past Surgical History:  Procedure Laterality Date  . CATARACT EXTRACTION Bilateral   . INTRAOCULAR LENS INSERTION Bilateral   . RETINAL DETACHMENT SURGERY Bilateral     Family History  Problem Relation Age of Onset  . Diabetes Mother   . Atrial fibrillation Mother   . Diabetes Maternal Uncle   . Diabetes Maternal Grandmother   . Heart attack Maternal Grandmother   . Esophageal cancer Maternal Grandfather   . Heart attack Father   . Stroke Father   . Arthritis Father   . Heart attack Sister        at age 28    Social History:  reports that he has been smoking cigarettes.  He has been smoking about 0.50 packs per day. He has never used smokeless tobacco. He reports that he does not drink alcohol. His drug history is not on  file.  Review of Systems   HYPERTENSION: Blood pressure is fairly good, taking ramipril 5 mg as before  Lipids: LDL has been well controlled, not on any statin drugs   Lab Results  Component Value Date   CHOL 140 06/13/2017   HDL 36.70 (L) 06/13/2017   LDLCALC 93 06/13/2017  TRIG 54.0 06/13/2017   CHOLHDL 4 06/13/2017    HYPONATREMIA: Long-standing and asymptomatic. 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   Demeclocycline was prescribed previously but  not covered by insurance.  Cortrosyn stimulation test was also normal   Urinary sodium previously was 138 and U Osm 245  [name] generally asymptomatic without any nausea or decreased appetite He has cut back on fluids but if not consistent with this, sodium level pending  Thyroid functions have been normal   Lab Results  Component Value Date   CREATININE 0.95 12/27/2017   BUN 9 12/27/2017   NA 129 (L) 12/27/2017   K 4.7 12/27/2017   CL 93 (L) 12/27/2017   CO2 33 (H) 12/27/2017    Chronic back pain, taking narcotic medications as needed upto every 4 hours, he is on oxycodone from his PCP  Visual loss from retinopathy, followed by ophthalmologist  He has had symptoms of neuropathy, including shooting pains, treated with gabapentin Has absent sensation in the feet on exam  Anxiety: He takes Xanax regularly twice a day and  has been taking this for 20 years Now prescribed by his PCP  [name] smoking about 8 cigarettes a day and not able to quit     Examination:   BP (!) 180/80 (BP Location: Left Arm, Patient Position: Sitting, Cuff Size: Normal)   Pulse 60   Ht  (1.778 m)   Wt 145 lb 3.2 oz (65.9 kg)   SpO2 98%   BMI 20.83 kg/m   Body mass index is 20.83 kg/m.     Diabetic Foot Exam - Simple   Simple  Foot Form Diabetic Foot exam was performed with the following findings:  Yes 12/27/2017  4:29 PM  Visual Inspection No deformities, no ulcerations, no other skin breakdown bilaterally:  Yes See comments:  Yes Sensation Testing See comments:  Yes Pulse Check See comments:  Yes Comments Absent distal monofilament sensation in the feet Absent pedal pulses     Assesment/PLAN:  Diabetes type 1, long-standing with fair control  See history of present illness for detailed discussion of current blood sugar patterns, problems identified and current management  His A1c is relatively better at 7.7  Blood sugars have not been consistent but recently lower overall Difficulty adjusting his dose more recently because he has not had tested coverage for him his insurance for the One Touch and will be given an Accu-Chek guide meter today High his blood sugars are around lunchtime usually because of inadequate coverage of his breakfast meal when he is eating oatmeal compared to eggs and toast  Currently also not very active, previously walking for exercise [name] still not able to get the freestyle Libre sensor because of issues with his insurance    New prescription for Accu-Chek test strips has been sent  He can reduce [place] by 2 units at least for now and adjusted periodically based on his fasting or overnight readings  He should take at least 6 units to cover oatmeal at breakfast in the morning also he will make note of fur whether this dosage is adequate and also may need to adjust the dose if sugars are higher with other types of meals in the morning  Adjust the dose of NovoLog based on carbohydrate  and meal size more consistently  To check blood sugars more frequently about 2 hours after breakfast  Also may reduce dose if planning to be very active right after a meal   2.  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  Need to follow-up labs  today    3. Hypertension: well-controlled  4.  Neuropathy: Discussed regular foot exams for his neuropathy, encouraged him to get diabetic shoes  He will also try to cut back further on smoking   Counseling time on various problems discussed as in assessment and plan sections is over 50% of today's 25 minute visit        Patient Instructions  Tresiba 40 units daily  Novolog 6 at Breakfast with oatmeal    Reather Littler 12/30/2017, 10:51 AM     Addendum: Sodium 129, needs further fluid restriction except in hot weather

## 2017-12-27 NOTE — Patient Instructions (Addendum)
Tresiba 40 units daily  Novolog 6 at Breakfast with oatmeal

## 2017-12-28 ENCOUNTER — Other Ambulatory Visit: Payer: Self-pay

## 2017-12-28 LAB — COMPREHENSIVE METABOLIC PANEL
ALT: 8 U/L (ref 0–53)
AST: 17 U/L (ref 0–37)
Albumin: 4.1 g/dL (ref 3.5–5.2)
Alkaline Phosphatase: 65 U/L (ref 39–117)
BUN: 9 mg/dL (ref 6–23)
CO2: 33 mEq/L — ABNORMAL HIGH (ref 19–32)
Calcium: 9.2 mg/dL (ref 8.4–10.5)
Chloride: 93 mEq/L — ABNORMAL LOW (ref 96–112)
Creatinine, Ser: 0.95 mg/dL (ref 0.40–1.50)
GFR: 86.43 mL/min (ref >60.00)
Glucose, Bld: 243 mg/dL — ABNORMAL HIGH (ref 70–99)
Potassium: 4.7 mEq/L (ref 3.5–5.1)
Sodium: 129 mEq/L — ABNORMAL LOW (ref 135–145)
Total Bilirubin: 0.6 mg/dL (ref 0.2–1.2)
Total Protein: 7.2 g/dL (ref 6.0–8.3)

## 2017-12-28 LAB — HEMOGLOBIN A1C: Hgb A1c MFr Bld: 7.8 % — ABNORMAL HIGH (ref 4.6–6.5)

## 2017-12-28 MED ORDER — GLUCOSE BLOOD VI STRP: ORAL_STRIP | 12 refills | Status: DC

## 2017-12-28 NOTE — Progress Notes (Signed)
Please call and let him know that his sodium is down to 129, he needs to further restrict fluid intake when not thirsty

## 2018-01-02 ENCOUNTER — Telehealth: Payer: Self-pay | Admitting: Endocrinology

## 2018-01-02 NOTE — Telephone Encounter (Signed)
Patient that he wanted to talk to noah about getting his test [phone]

## 2018-01-03 ENCOUNTER — Telehealth: Payer: Self-pay | Admitting: Endocrinology

## 2018-01-03 NOTE — Telephone Encounter (Signed)
Patient calling back about having some type of paper sent over to his pharmacy for the test strips. Please advise

## 2018-01-03 NOTE — Telephone Encounter (Signed)
Called patient and his mother answered and stated to call pt on his cell phone number at [phone]. Patient was called on cell number and stated that [facility] needs a form filled out by the doctor regarding his test strips. Patient was advised that this form will be filled out and sent back as soon as possible. Pt verbalized understanding.

## 2018-01-04 ENCOUNTER — Telehealth: Payer: Self-pay | Admitting: Endocrinology

## 2018-01-04 NOTE — Telephone Encounter (Addendum)
[facility] is calling about certificate of medical ness. that was faxed over on the 22nd for the patients test strips. They are calling for the status of this paperwork    Peter Mendez ([facility]) [phone]  Please advise

## 2018-01-04 NOTE — Telephone Encounter (Signed)
Called pharmacy and stated that form was faxed already. Pharmacist verified that this form was received.

## 2018-01-04 NOTE — Telephone Encounter (Signed)
Peter Mendez at [facility]   [phone]

## 2018-02-27 ENCOUNTER — Other Ambulatory Visit: Payer: Self-pay

## 2018-02-27 MED ORDER — INSULIN ASPART 100 UNIT/ML FLEXPEN
PEN_INJECTOR | SUBCUTANEOUS | 2 refills | Status: DC
Start: 2018-02-27 — End: 2018-06-29

## 2018-03-29 ENCOUNTER — Ambulatory Visit (INDEPENDENT_AMBULATORY_CARE_PROVIDER_SITE_OTHER): Payer: Medicare Other | Admitting: Endocrinology

## 2018-03-29 ENCOUNTER — Encounter: Payer: Self-pay | Admitting: Endocrinology

## 2018-03-29 VITALS — BP 142/68 | HR 74 | Ht 71.0 in | Wt 144.0 lb

## 2018-03-29 DIAGNOSIS — E782 Mixed hyperlipidemia: Secondary | ICD-10-CM | POA: Diagnosis not present

## 2018-03-29 DIAGNOSIS — E1065 Type 1 diabetes mellitus with hyperglycemia: Secondary | ICD-10-CM | POA: Diagnosis not present

## 2018-03-29 DIAGNOSIS — E871 Hypo-osmolality and hyponatremia: Secondary | ICD-10-CM | POA: Diagnosis not present

## 2018-03-29 LAB — POCT GLYCOSYLATED HEMOGLOBIN (HGB A1C): Hemoglobin A1C: 7.9 % — AB (ref 4.0–5.6)

## 2018-03-29 LAB — LIPID PANEL
CHOLESTEROL: 131 mg/dL (ref 0–200)
HDL: 44.1 mg/dL (ref >39.00)
LDL CALC: 78 mg/dL (ref 0–99)
NonHDL: 86.7
TRIGLYCERIDES: 42 mg/dL (ref 0.0–149.0)
Total CHOL/HDL Ratio: 3
VLDL: 8.4 mg/dL (ref 0.0–40.0)

## 2018-03-29 LAB — BASIC METABOLIC PANEL
BUN: 9 mg/dL (ref 6–23)
CHLORIDE: 93 meq/L — AB (ref 96–112)
CO2: 33 meq/L — AB (ref 19–32)
Calcium: 9.3 mg/dL (ref 8.4–10.5)
Creatinine, Ser: 0.92 mg/dL (ref 0.40–1.50)
GFR: 89.62 mL/min (ref >60.00)
Glucose, Bld: 229 mg/dL — ABNORMAL HIGH (ref 70–99)
POTASSIUM: 4.7 meq/L (ref 3.5–5.1)
Sodium: 129 mEq/L — ABNORMAL LOW (ref 135–145)

## 2018-03-29 MED ORDER — FREESTYLE LIBRE 14 DAY SENSOR MISC: 1.0000 [IU] | 4 refills | Status: DC

## 2018-03-29 MED ORDER — FREESTYLE LIBRE 14 DAY READER DEVI: 1.0000 | Freq: Once | 0 refills | Status: AC

## 2018-03-29 NOTE — Progress Notes (Signed)
Patient ID: Peter NewnessEarl T Bloch Jr., male   DOB: 09/04/1960, 57 y.o.   MRN: [id number]   Reason for Appointment: Endocrinology follow-up   History of Present Illness   Diagnosis: Type 1 diabetes mellitus, diagnosis 1991.  He has been on basal bolus insulin regimen for a few years His A1c previously was consistently around 7-7.5 He tends to have significant fluctuation in blood sugars at all times. He has difficulty keeping his postprandial blood sugars consistent with mealtime insulin coverage and estimating the amount of insulin needed for various kinds of foods since he does not count carbohydrates A1c has been higher since 2014  Recent history:   The insulin regimen is  [place] 42 units daily in am; Novolog 4-8 usually   A1c is about the same at 7.9    Current diabetes management, problems identified and blood sugar patterns  Although he was told to take 40 units of Tresiba on the last visit he says for the last 4 to 6 weeks he has been taking only 36 because of relatively low morning readings  However the last 2 weeks he has frequent low FASTING readings and has not changed his insulin  However his blood sugars are generally higher later in the day and averaging over 200 the rest of the day except probably before suppertime  He usually does take his insulin for meals before eating and not clear why his blood sugars are higher after meals recently without change in diet  His weight is about the same on the lower than in the beginning of the year  Highest blood sugars on average are midday and late evening  As before he has marked variability in his blood sugars with standard deviation 69  [name] not having low blood sugar later in the day  Also not very active lately  Is not able to order the freestyle [employer] system because of reportedly insurance denial   Blood Glucose readings: From monitor download as below. [name] checking his blood sugar 5 times or more per day in  the last 30 days   PRE-MEAL Fasting Lunch Dinner Bedtime Overall  Glucose range: 44-186      Mean/median:  107  221  167  216  189+/-69   POST-MEAL PC Breakfast PC Lunch PC Dinner  Glucose range:     Mean/median:    212    PREVIOUS readings  PRE-MEAL Fasting Lunch Dinner Bedtime Overall  Glucose range:  49-290  132-344    42-469  Mean/median:  177  213  217  188 +/-69   POST-MEAL PC Breakfast PC Lunch PC Dinner  Glucose range:    63-308  Mean/median:    182   Hypoglycemia awareness: Usually develops symptoms when blood glucose is less than 50, usually sweating.   Meals: Inconsistent schedule, Inconsistent quantity. Breakfast is at 6-7 am Supper 6-7 pm  Variable intake at breakfast, some oatmeal; 1/2 sandwich or peanut butter crackers at bedtime  Physical activity: exercise:  no more walking currently   Dietician visit: Most recent:, 4/09.   Wt Readings from Last 3 Encounters:  03/29/18 144 lb (65.3 kg)  12/27/17 145 lb 3.2 oz (65.9 kg)  09/19/17 151 lb 9.6 oz (68.8 kg)    Lab Results  Component Value Date   HGBA1C 7.9 (A) 03/29/2018   HGBA1C 7.8 (H) 12/27/2017   HGBA1C 7.7 (A) 12/27/2017   Lab Results  Component Value Date   MICROALBUR 3.2 (H) 03/14/2017  LDLCALC 93 06/13/2017   CREATININE 0.95 12/27/2017    Other active problems: See review of systems     Allergies as of 03/29/2018      Reactions   Pravastatin Sodium    myalgia      Medication List        Accurate as of 03/29/18 10:38 AM. Always use your most recent med list.          alprazolam 2 MG tablet Commonly known as:  XANAX take 1 tablet by mouth twice a day if needed for anxiety   FREESTYLE LIBRE 14 DAY READER Devi 1 Device by Does not apply route once for 1 dose.   FREESTYLE LIBRE 14 DAY SENSOR Misc 1 Units by Does not apply route every 14 (fourteen) days.   gabapentin 600 MG tablet Commonly known as:  NEURONTIN Take 1 tablet 3 times a day as needed   glucose blood test  strip Use to check blood sugars 7 times per day dx code E10.41   glucose blood test strip Use as instructed to check blood sugar 6 times daily. Dx:E10.49   ibuprofen 200 MG tablet Commonly known as:  ADVIL,MOTRIN Take 200 mg by mouth every 6 (six) hours as needed.   insulin aspart 100 UNIT/ML FlexPen Commonly known as:  NOVOLOG inject 6 to 8 units subcutaneously four times a day   insulin degludec 100 UNIT/ML Sopn FlexTouch Pen Commonly known as:  TRESIBA Inject 42 units daily.   Insulin Pen Needle 31G X 8 MM Misc USE TO INJECT INSULIN 5 TIMES A DAY   onetouch ultrasoft lancets Use as instructed to check blood sugars 7 times per day   oxycodone 30 MG immediate release tablet Commonly known as:  ROXICODONE 15 mg every 4 (four) hours as needed.   ramipril 5 MG capsule Commonly known as:  ALTACE take 1 capsule by mouth once daily   triamcinolone cream 0.1 % Commonly known as:  KENALOG Apply topically 2 (two) times daily.       Allergies:  Allergies  Allergen Reactions  . Pravastatin Sodium     myalgia    Past Medical History:  Diagnosis Date  . Chicken pox   . Claudication (HCC)   . Diabetes 1.5, managed as type 1 (HCC)   . Hypertension   . Low back pain   . Tobacco abuse     Past Surgical History:  Procedure Laterality Date  . CATARACT EXTRACTION Bilateral   . INTRAOCULAR LENS INSERTION Bilateral   . RETINAL DETACHMENT SURGERY Bilateral     Family History  Problem Relation Age of Onset  . Diabetes Mother   . Atrial fibrillation Mother   . Diabetes Maternal Uncle   . Diabetes Maternal Grandmother   . Heart attack Maternal Grandmother   . Esophageal cancer Maternal Grandfather   . Heart attack Father   . Stroke Father   . Arthritis Father   . Heart attack Sister        at age 31    Social History:  reports that he has been smoking cigarettes. He has been smoking about 0.50 packs per day. He has never used smokeless tobacco. He reports that he  does not drink alcohol. His drug history is not on file.  Review of Systems   HYPERTENSION: Blood pressure is controlled  Is on 5 mg ramipril    Lipids: LDL has been well controlled, not on any statin drugs Tends to have low HDL   Lab  Results  Component Value Date   CHOL 140 06/13/2017   HDL 36.70 (L) 06/13/2017   LDLCALC 93 06/13/2017   TRIG 54.0 06/13/2017   CHOLHDL 4 06/13/2017    HYPONATREMIA: Long-standing and asymptomatic. 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   Demeclocycline was prescribed previously but  not covered by insurance.  Cortrosyn stimulation test was also normal   Urinary sodium previously was 138 and U Osm 245  [name] consistently asymptomatic without any nausea or decreased appetite Has been asked to restrict his fluids but he is not able to do this consistently   Lab Results  Component Value Date   CREATININE 0.95 12/27/2017   BUN 9 12/27/2017   NA 129 (L) 12/27/2017   K 4.7 12/27/2017   CL 93 (L) 12/27/2017   CO2 33 (H) 12/27/2017    Chronic back pain, taking narcotic medications as needed upto every 4 hours, he is on oxycodone from his PCP  Visual loss from retinopathy, followed by ophthalmologist  He has had numbness and shooting pains in his legs, treated with gabapentin Has absent sensation in the feet on exam  [name] smoking about 8 cigarettes a day as before     Examination:   BP (!) 142/68   Pulse 74   Ht 5\' 11"  (1.803 m)   Wt 144 lb (65.3 kg)   BMI 20.08 kg/m   Body mass index is 20.08 kg/m.   No ankle edema present  Assesment/PLAN:  Diabetes type 1, long-standing on basal bolus insulin regimen and fair control  See history of present illness for detailed discussion of current blood  sugar patterns, problems identified and current management  His A1c is staying about the same and not below 7 for some time A1c is now 7.9  Most obvious blood sugar pattern is hypoglycemia early morning recently even with reducing his Evaristo Bury compared to the last visit Is taking 36 units for the last few weeks Most of his blood sugars are on waking up and not overnight, lowest blood sugar 44 this Tuesday Although he is sometimes taking his mealtime insulin later in the morning or possibly skipping this when his blood sugar is low his blood sugars are fairly consistently high after meals later in the day This is likely to be from taking inadequate insulin at mealtimes and not looking at meal size and carbohydrate content and this was discussed Does have better readings between lunch and suppertime compared to after supper  Recommendations:  New prescription for freestyle [name] has been sent to DME supplier so he can start using this  He needs to reduce his [place] by at least 4 units down to 32 units and discussed adjusting this at least weekly based on morning blood sugar  He will call if he has consistently low blood sugar  Currently he needs to take 6 units of NovoLog at lunch and 8 at dinner 4 average meals and adjust further based on meal size and carbohydrate intake, usually needs 2 more units if eating more than 1 serving of carbohydrate  May check blood sugars occasionally during the night at 3 AM  Encouraged him to walk for exercise especially late evening when his blood sugars are higher   2.  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 Hypertension: well-controlled     Counseling time on various problems discussed as in assessment and plan sections is over 50% of today's 25 minute visit        Patient Instructions  6 at lunch at 8 at  dinner of Novolog  TRESIBA REDUCE BY 4 TO 32 units in am    Reather Littler 03/29/2018, 10:38 AM

## 2018-03-29 NOTE — Patient Instructions (Addendum)
6 at lunch at 8 at dinner of Novolog  TRESIBA REDUCE BY 4 TO 32 units in am

## 2018-06-26 ENCOUNTER — Other Ambulatory Visit (INDEPENDENT_AMBULATORY_CARE_PROVIDER_SITE_OTHER): Payer: Medicare Other

## 2018-06-26 DIAGNOSIS — E1065 Type 1 diabetes mellitus with hyperglycemia: Secondary | ICD-10-CM | POA: Diagnosis not present

## 2018-06-26 LAB — COMPREHENSIVE METABOLIC PANEL
ALT: 10 U/L (ref 0–53)
AST: 18 U/L (ref 0–37)
Albumin: 4 g/dL (ref 3.5–5.2)
Alkaline Phosphatase: 66 U/L (ref 39–117)
BILIRUBIN TOTAL: 0.6 mg/dL (ref 0.2–1.2)
BUN: 8 mg/dL (ref 6–23)
CALCIUM: 9.2 mg/dL (ref 8.4–10.5)
CO2: 33 meq/L — AB (ref 19–32)
CREATININE: 0.78 mg/dL (ref 0.40–1.50)
Chloride: 94 mEq/L — ABNORMAL LOW (ref 96–112)
GFR: 108.33 mL/min (ref >60.00)
GLUCOSE: 175 mg/dL — AB (ref 70–99)
Potassium: 4.9 mEq/L (ref 3.5–5.1)
Sodium: 130 mEq/L — ABNORMAL LOW (ref 135–145)
Total Protein: 6.7 g/dL (ref 6.0–8.3)

## 2018-06-26 LAB — URINALYSIS, ROUTINE W REFLEX MICROSCOPIC
Bilirubin Urine: NEGATIVE
Hgb urine dipstick: NEGATIVE
Ketones, ur: NEGATIVE
Leukocytes, UA: NEGATIVE
Nitrite: NEGATIVE
PH: 7 (ref 5.0–8.0)
RBC / HPF: NONE SEEN (ref 0–?)
TOTAL PROTEIN, URINE-UPE24: NEGATIVE
Urine Glucose: NEGATIVE
Urobilinogen, UA: 0.2 (ref 0.0–1.0)
WBC UA: NONE SEEN (ref 0–?)

## 2018-06-26 LAB — MICROALBUMIN / CREATININE URINE RATIO
CREATININE, U: 32.6 mg/dL
Microalb Creat Ratio: 3.2 mg/g (ref 0.0–30.0)
Microalb, Ur: 1 mg/dL (ref 0.0–1.9)

## 2018-06-26 LAB — HEMOGLOBIN A1C: Hgb A1c MFr Bld: 8.1 % — ABNORMAL HIGH (ref 4.6–6.5)

## 2018-06-29 ENCOUNTER — Telehealth: Payer: Self-pay | Admitting: Endocrinology

## 2018-06-29 ENCOUNTER — Ambulatory Visit (INDEPENDENT_AMBULATORY_CARE_PROVIDER_SITE_OTHER): Payer: Medicare Other | Admitting: Endocrinology

## 2018-06-29 ENCOUNTER — Encounter: Payer: Self-pay | Admitting: Endocrinology

## 2018-06-29 VITALS — BP 152/84 | HR 86 | Ht 71.0 in | Wt 142.0 lb

## 2018-06-29 DIAGNOSIS — E1065 Type 1 diabetes mellitus with hyperglycemia: Secondary | ICD-10-CM

## 2018-06-29 DIAGNOSIS — E871 Hypo-osmolality and hyponatremia: Secondary | ICD-10-CM | POA: Diagnosis not present

## 2018-06-29 DIAGNOSIS — I10 Essential (primary) hypertension: Secondary | ICD-10-CM

## 2018-06-29 MED ORDER — INSULIN ASPART 100 UNIT/ML FLEXPEN: PEN_INJECTOR | SUBCUTANEOUS | 2 refills | Status: DC

## 2018-06-29 MED ORDER — GABAPENTIN 600 MG PO TABS: ORAL_TABLET | ORAL | 3 refills | Status: DC

## 2018-06-29 MED ORDER — INSULIN DEGLUDEC 100 UNIT/ML ~~LOC~~ SOPN: PEN_INJECTOR | SUBCUTANEOUS | 3 refills | Status: DC

## 2018-06-29 MED ORDER — RAMIPRIL 5 MG PO CAPS: 5.0000 mg | ORAL_CAPSULE | Freq: Every day | ORAL | 3 refills | Status: DC

## 2018-06-29 NOTE — Telephone Encounter (Signed)
This has been done.

## 2018-06-29 NOTE — Addendum Note (Signed)
Addended by: Reather LittlerKUMAR, Tira Lafferty on: 06/29/2018 01:09 PM   Modules accepted: Orders

## 2018-06-29 NOTE — Patient Instructions (Signed)
Take 5-6 units NOVOLOG AT bFST  NOVOLOG 7 U NITS AT SUPPER

## 2018-06-29 NOTE — Telephone Encounter (Signed)
I do not see that this order has been placed- can you place order for referral for this patient to see Oran ReinLaura Jobe

## 2018-06-29 NOTE — Progress Notes (Signed)
Patient ID: Peter NewnessEarl T Cart Jr., male   DOB: 02/19/1961, 57 y.o.   MRN: [id number]   Reason for Appointment: Endocrinology follow-up   History of Present Illness   Diagnosis: Type 1 diabetes mellitus, diagnosis 1991.  He has been on basal bolus insulin regimen for a few years His A1c previously was consistently around 7-7.5 He tends to have significant fluctuation in blood sugars at all times. He has difficulty keeping his postprandial blood sugars consistent with mealtime insulin coverage and estimating the amount of insulin needed for various kinds of foods since he does not count carbohydrates A1c has been higher since 2014  Recent history:   The insulin regimen is  [place] 32 units daily in am; Novolog 4-8 usually   A1c is about the same at 8.1    Current diabetes management, problems identified and blood sugar patterns  His Evaristo Buryresiba was reduced last visit because of frequent low fasting readings  Recently because of not being able to set up his freestyle [place]ibre system he has not checked his sugars much and only using the strips that come with the freestyle libre  No sugar patterns can be identified because of lack of graphic data from his upload  He says he is not using his One Touch meter as it does not active  Also not checking blood sugars as frequently as usual  HYPOGLYCEMIA has been occurring in the morning on waking up this week, 3 days but not the last 2 mornings  He has had one low blood sugar midday and once at suppertime  No recent weight change except a couple of pounds of the bottom   Blood Glucose readings: From monitor download as below.   PRE-MEAL Fasting Lunch Dinner Bedtime Overall  Glucose range: 46-206  47-222  227  195-145   Mean/median:     ?   Previous readings:  PRE-MEAL Fasting Lunch Dinner Bedtime Overall  Glucose range: 44-186      Mean/median:  107  221  167  216  189+/-69   POST-MEAL PC Breakfast PC Lunch PC Dinner  Glucose  range:     Mean/median:    212      Hypoglycemia awareness: Usually develops symptoms when blood glucose is less than 50, usually sweating.   Meals: Inconsistent schedule, Inconsistent quantity. Breakfast is at 6-7 am Supper 6-7 pm  Variable intake at breakfast, some oatmeal; 1/2 sandwich or peanut butter crackers at bedtime  Physical activity: exercise:  no more walking currently   Dietician visit: Most recent:, 4/09.   Wt Readings from Last 3 Encounters:  06/29/18 142 lb (64.4 kg)  03/29/18 144 lb (65.3 kg)  12/27/17 145 lb 3.2 oz (65.9 kg)    Lab Results  Component Value Date   HGBA1C 8.1 (H) 06/26/2018   HGBA1C 7.9 (A) 03/29/2018   HGBA1C 7.8 (H) 12/27/2017   Lab Results  Component Value Date   MICROALBUR 1.0 06/26/2018   LDLCALC 78 03/29/2018   CREATININE 0.78 06/26/2018    Other active problems: See review of systems     Allergies as of 06/29/2018      Reactions   Pravastatin Sodium    myalgia      Medication List        Accurate as of 06/29/18  1:03 PM. Always use your most recent med list.          alprazolam 2 MG tablet Commonly known as:  XANAX take 1 tablet by mouth twice  a day if needed for anxiety   FREESTYLE LIBRE 14 DAY SENSOR Misc 1 Units by Does not apply route every 14 (fourteen) days.   gabapentin 600 MG tablet Commonly known as:  NEURONTIN Take 1 tablet 3 times a day as needed   glucose blood test strip Use to check blood sugars 7 times per day dx code E10.41   glucose blood test strip Use as instructed to check blood sugar 6 times daily. Dx:E10.49   ibuprofen 200 MG tablet Commonly known as:  ADVIL,MOTRIN Take 200 mg by mouth every 6 (six) hours as needed.   insulin aspart 100 UNIT/ML FlexPen Commonly known as:  NOVOLOG inject 6 to 8 units subcutaneously four times a day   insulin degludec 100 UNIT/ML Sopn FlexTouch Pen Commonly known as:  TRESIBA Inject 42 units daily.   Insulin Pen Needle 31G X 8 MM Misc USE TO  INJECT INSULIN 5 TIMES A DAY   onetouch ultrasoft lancets Use as instructed to check blood sugars 7 times per day   oxycodone 30 MG immediate release tablet Commonly known as:  ROXICODONE 15 mg every 4 (four) hours as needed.   ramipril 5 MG capsule Commonly known as:  ALTACE Take 1 capsule (5 mg total) by mouth daily.   triamcinolone cream 0.1 % Commonly known as:  KENALOG Apply topically 2 (two) times daily.       Allergies:  Allergies  Allergen Reactions  . Pravastatin Sodium     myalgia    Past Medical History:  Diagnosis Date  . Chicken pox   . Claudication (HCC)   . Diabetes 1.5, managed as type 1 (HCC)   . Hypertension   . Low back pain   . Tobacco abuse     Past Surgical History:  Procedure Laterality Date  . CATARACT EXTRACTION Bilateral   . INTRAOCULAR LENS INSERTION Bilateral   . RETINAL DETACHMENT SURGERY Bilateral     Family History  Problem Relation Age of Onset  . Diabetes Mother   . Atrial fibrillation Mother   . Diabetes Maternal Uncle   . Diabetes Maternal Grandmother   . Heart attack Maternal Grandmother   . Esophageal cancer Maternal Grandfather   . Heart attack Father   . Stroke Father   . Arthritis Father   . Heart attack Sister        at age 73    Social History:  reports that he has been smoking cigarettes. He has been smoking about 0.50 packs per day. He has never used smokeless tobacco. He reports that he does not drink alcohol. His drug history is not on file.  Review of Systems   HYPERTENSION: Blood pressure is controlled  Is on 5 mg ramipril  Recent BP at home: 136/78 He may be anxious today  Lipids: LDL has been well controlled, not on any statin drugs Tends to have low HDL   Lab Results  Component Value Date   CHOL 131 03/29/2018   HDL 44.10 03/29/2018   LDLCALC 78 03/29/2018   TRIG 42.0 03/29/2018   CHOLHDL 3 03/29/2018    HYPONATREMIA: Long-standing and asymptomatic. 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   Demeclocycline was prescribed previously but  not  covered by insurance.  Cortrosyn stimulation test was also normal   Urinary sodium previously was 138 and U Osm 245  [name] consistently asymptomatic without any nausea or decreased appetite Has been asked to restrict his fluids    Lab Results  Component Value Date   CREATININE 0.78 06/26/2018   BUN 8 06/26/2018   NA 130 (L) 06/26/2018   K 4.9 06/26/2018   CL 94 (L) 06/26/2018   CO2 33 (H) 06/26/2018    Chronic back pain, taking narcotic medications as needed upto every 4 hours, he is on oxycodone from his pain clinic  Visual loss from retinopathy, followed by ophthalmologist  He has had numbness and shooting pains in his legs, treated with gabapentin Has absent sensation in the feet on exam  Has not quit smoking yet     Examination:   BP (!) 152/84   Pulse 86   Ht 5\' 11"  (1.803 m)   Wt 142 lb (64.4 kg)   SpO2 98%   BMI 19.80 kg/m   Body mass index is 19.8 kg/m.   No pedal edema present  Assesment/PLAN:  Diabetes type 1, long-standing on basal bolus insulin regimen at that  See history of present illness for detailed discussion of current blood sugar patterns, problems identified and current management  His A1c is still about the same at 8.1  Without adequate glucose monitoring or information from his meter download difficult to adjust his insulin today Although he still has had episodes of hypoglycemia waking up this time has been inconsistent with his regimen of 32 units [place]  He appears not be getting enough insulin coverage for his breakfast and evening meal   He will be referred for instructions on how to use the freestyle libre  sensor  He will increase his NovoLog by 1 to 2 units at breakfast and least 1 unit at suppertime for now  Discussed blood sugar targets after meals  To call if having more consistently low sugars fasting  Recommended balanced meals with some protein including at breakfast   2.  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 Hypertension: Blood pressure is relatively high today but he has better readings at home May be whitecoat syndrome        Patient Instructions  Take 5-6 units NOVOLOG AT bFST  NOVOLOG 7 U NITS AT SUPPER    Reather Littler 06/29/2018, 1:03 PM

## 2018-06-29 NOTE — Telephone Encounter (Signed)
Patient needs Referral to [facility], per Dr. Lucianne MussKumar

## 2018-08-28 NOTE — Progress Notes (Signed)
Patient ID: Peter Mendez., male   DOB: 04/18/1961, 58 y.o.   MRN: [id number]   Reason for Appointment: Endocrinology follow-up   History of Present Illness   Diagnosis: Type 1 diabetes mellitus, diagnosis 1991.  He has been on basal bolus insulin regimen for a few years His A1c previously was consistently around 7-7.5 He tends to have significant fluctuation in blood sugars at all times. He has difficulty keeping his postprandial blood sugars consistent with mealtime insulin coverage and estimating the amount of insulin needed for various kinds of foods since he does not count carbohydrates A1c has been higher since 2014  Recent history:   The insulin regimen is  [place] 32 units daily in am; Novolog 4 acb 6-8 acl & acs usually   A1c is higher at 8.8  Current diabetes management, problems identified and blood sugar patterns  [name] finally using the freestyle libre sensor  Although he has not compared to his freestyle libre to his fingerstick glucose readings he appears to be having reliable information  His Peter Mendez was continued unchanged on the last visit and he takes it every morning consistently  Although he thinks he taking extra 2 to 3 units NovoLog when his sugars are over 250 he does not appear to be taking corrections for lower readings and blood sugars tend to stay persistently high  He says that he changed his NovoLog pen a few days ago but his sugars are still higher later in the day  HYPOGLYCEMIA has been occurring in the morning on waking only about twice in the last 2 weeks   Blood Glucose readings: From monitor download as below.   CONTINUOUS GLUCOSE MONITORING RECORD INTERPRETATION    Dates of Recording: 1/9 through 08/29/2018  Sensor description: [employer]  Results statistics:   CGM use % of time  95  Average and SD  204+/-63  Time in range     33   %  % Time Above 180  65  % Time above 250  24  % Time Below target 2    Glycemic  patterns summary:   Hyperglycemic episodes are occurring usually late mornings or after breakfast along with tendency to persistent hyperglycemia the rest of the day into late at night.  Has another couple of postprandial spikes after lunch or dinner on his 14-day review  Hypoglycemic episodes have occurred early morning around 7 AM but only on 2 or 3 occasions none for the last 10 days  Overnight periods: Blood sugars are steadily decreasing through the night home starting off around 220 at midnight and decreasing to an average of about 125 at 6 AM  Preprandial periods: Blood sugars are excellent fasting, lowest reading 42 at breakfast Blood sugars are well over 200 at lunch and dinner with some variability  Postprandial periods: After breakfast: Blood sugars are going up significantly but progressively all the way up to lunchtime and this may be somewhat variable and less in the last 2 days But sugars after lunch appear to be relatively flat and only going up incrementally a few days After dinner blood sugars are relatively flat again and only occasionally going up further   PRE-MEAL Fasting Lunch Dinner Bedtime Overall  Glucose range:     195-325   Mean/median:  131  222  237   204   POST-MEAL PC Breakfast PC Lunch PC Dinner  Glucose range:     Mean/median:  168  258  239  Hypoglycemia awareness: Usually develops symptoms when blood glucose is less than 50, usually sweating.   Meals: Inconsistent schedule, Inconsistent quantity. Breakfast is at 6-7 am Supper 6-7 pm  Variable intake at breakfast, some oatmeal; 1/2 sandwich or peanut butter crackers at bedtime  Physical activity: exercise:  no more walking currently   Dietician visit: Most recent:, 4/09.   Wt Readings from Last 3 Encounters:  08/29/18 139 lb (63 kg)  06/29/18 142 lb (64.4 kg)  03/29/18 144 lb (65.3 kg)    Lab Results  Component Value Date   HGBA1C 8.8 (A) 08/29/2018   HGBA1C 8.1 (H) 06/26/2018    HGBA1C 7.9 (A) 03/29/2018   Lab Results  Component Value Date   MICROALBUR 1.0 06/26/2018   LDLCALC 78 03/29/2018   CREATININE 0.78 06/26/2018    Other active problems: See review of systems     Allergies as of 08/29/2018      Reactions   Pravastatin Sodium    myalgia      Medication List       Accurate as of August 29, 2018  2:54 PM. Always use your most recent med list.        alprazolam 2 MG tablet Commonly known as:  XANAX take 1 tablet by mouth twice a day if needed for anxiety   FREESTYLE LIBRE 14 DAY SENSOR Misc 1 Units by Does not apply route every 14 (fourteen) days.   gabapentin 600 MG tablet Commonly known as:  NEURONTIN Take 1 tablet 3 times a day as needed   glucose blood test strip Commonly known as:  ONE TOUCH ULTRA TEST Use to check blood sugars 7 times per day dx code E10.41   glucose blood test strip Commonly known as:  ACCU-CHEK GUIDE Use as instructed to check blood sugar 6 times daily. Dx:E10.49   ibuprofen 200 MG tablet Commonly known as:  ADVIL,MOTRIN Take 200 mg by mouth every 6 (six) hours as needed.   insulin aspart 100 UNIT/ML FlexPen Commonly known as:  NOVOLOG FLEXPEN inject 6 to 8 units subcutaneously four times a day   insulin degludec 100 UNIT/ML Sopn FlexTouch Pen Commonly known as:  TRESIBA FLEXTOUCH Inject 42 units daily.   Insulin Glargine 100 UNIT/ML Solostar Pen Commonly known as:  LANTUS SOLOSTAR Start with 22 units in am and 14 units at 7 pm   Insulin Pen Needle 31G X 8 MM Misc Commonly known as:  B-D ULTRAFINE III SHORT PEN USE TO INJECT INSULIN 5 TIMES A DAY   onetouch ultrasoft lancets Use as instructed to check blood sugars 7 times per day   oxycodone 30 MG immediate release tablet Commonly known as:  ROXICODONE 15 mg every 4 (four) hours as needed.   ramipril 5 MG capsule Commonly known as:  ALTACE Take 1 capsule (5 mg total) by mouth daily.   triamcinolone cream 0.1 % Commonly known as:   KENALOG Apply topically 2 (two) times daily.       Allergies:  Allergies  Allergen Reactions  . Pravastatin Sodium     myalgia    Past Medical History:  Diagnosis Date  . Chicken pox   . Claudication (HCC)   . Diabetes 1.5, managed as type 1 (HCC)   . Hypertension   . Low back pain   . Tobacco abuse     Past Surgical History:  Procedure Laterality Date  . CATARACT EXTRACTION Bilateral   . INTRAOCULAR LENS INSERTION Bilateral   . RETINAL DETACHMENT SURGERY Bilateral  Family History  Problem Relation Age of Onset  . Diabetes Mother   . Atrial fibrillation Mother   . Diabetes Maternal Uncle   . Diabetes Maternal Grandmother   . Heart attack Maternal Grandmother   . Esophageal cancer Maternal Grandfather   . Heart attack Father   . Stroke Father   . Arthritis Father   . Heart attack Sister        at age 59    Social History:  reports that he has been smoking cigarettes. He has been smoking about 0.50 packs per day. He has never used smokeless tobacco. He reports that he does not drink alcohol. No history on file for drug.  Review of Systems   HYPERTENSION: Blood pressure is controlled  Is on 5 mg ramipril  Recent BP at home: 144/78 Left side standing blood pressure was higher than on the right  BP Readings from Last 3 Encounters:  08/29/18 128/68  06/29/18 (!) 152/84  03/29/18 (!) 142/68     Lipids: LDL has been well controlled, not on any statin drugs Tends to have low HDL   Lab Results  Component Value Date   CHOL 131 03/29/2018   HDL 44.10 03/29/2018   LDLCALC 78 03/29/2018   TRIG 42.0 03/29/2018   CHOLHDL 3 03/29/2018    HYPONATREMIA: Long-standing and asymptomatic. 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  Most  likely has SIADH from chronic pain medications.   Demeclocycline was prescribed previously but  not covered by insurance.  Cortrosyn stimulation test was also normal   Urinary sodium previously was 138 and U Osm 245  [name] consistently asymptomatic with no nausea or decreased appetite Has been asked to restrict his fluid intake including water and other fluids   Lab Results  Component Value Date   CREATININE 0.78 06/26/2018   BUN 8 06/26/2018   NA 130 (L) 06/26/2018   K 4.9 06/26/2018   CL 94 (L) 06/26/2018   CO2 33 (H) 06/26/2018    Chronic back pain, taking narcotic medications as needed upto every 4 hours, he is on oxycodone from his pain clinic  Visual loss from retinopathy, followed by ophthalmologist  He has had numbness and shooting pains in his legs, treated with gabapentin Has absent sensation in the feet on exam       Examination:   BP 128/68 (Patient Position: Standing)   Pulse 60   Ht 5\' 11"  (1.803 m)   Wt 139 lb (63 kg)   SpO2 97%   BMI 19.39 kg/m   Body mass index is 19.39 kg/m.   No pedal edema present  Assesment/PLAN:  Diabetes type 1, long-standing on basal bolus insulin regimen at that  See history of present illness for detailed discussion of current blood sugar patterns, problems identified and current management  His A1c is higher at 8.8  Now with his freestyle Peter Mendez it appears that he is having daytime hyperglycemia with blood sugars dropping overnight into the near normal range Not clear why he is requiring much more basal rate during the day and less during the night which does not appear to be adequately regulated with his Peter Mendez He appears not be getting enough mealtime coverage also for most of the meals although blood sugars are  not recently as high after breakfast as other meals [name] also not getting enough correction for high readings  Recommendations:  Since is it unlikely that he will be able to be trained on the use an  insulin pump because of his visual difficulties will try to get him on Lantus twice a day instead of Tresiba for now  He will take 22 units in the morning and 14 in the evening  Discussed that if he has consistently high readings at lunch and dinner he will continue to increase his morning insulin by 2 units Lantus every 3 to 4 days  Also if his morning sugars are running high he will increase his evening insulin by 2 units again  He needs to take at least a unit for every 50 mg over 150 for high sugars  Most likely may need to take higher doses of insulin for larger meals at lunch or dinner     2.  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 Hypertension: Blood pressure is relatively high today but he has relatively low readings standing up indicating some degree of orthostatic pressure changes related to autonomic neuropathy We will continue same regimen   Counseling time on subjects discussed in assessment and plan sections is over 50% of today's 25 minute visit      There are no Patient Instructions on file for this visit.   Reather Littler 08/29/2018, 2:54 PM

## 2018-08-29 ENCOUNTER — Ambulatory Visit (INDEPENDENT_AMBULATORY_CARE_PROVIDER_SITE_OTHER): Payer: Medicare Other | Admitting: Endocrinology

## 2018-08-29 ENCOUNTER — Encounter: Payer: Self-pay | Admitting: Endocrinology

## 2018-08-29 VITALS — BP 128/68 | HR 60 | Ht 71.0 in | Wt 139.0 lb

## 2018-08-29 DIAGNOSIS — E1049 Type 1 diabetes mellitus with other diabetic neurological complication: Secondary | ICD-10-CM

## 2018-08-29 DIAGNOSIS — E1065 Type 1 diabetes mellitus with hyperglycemia: Secondary | ICD-10-CM

## 2018-08-29 DIAGNOSIS — I10 Essential (primary) hypertension: Secondary | ICD-10-CM | POA: Diagnosis not present

## 2018-08-29 DIAGNOSIS — E871 Hypo-osmolality and hyponatremia: Secondary | ICD-10-CM | POA: Diagnosis not present

## 2018-08-29 DIAGNOSIS — IMO0002 Reserved for concepts with insufficient information to code with codable children: Secondary | ICD-10-CM

## 2018-08-29 LAB — POCT GLYCOSYLATED HEMOGLOBIN (HGB A1C): HEMOGLOBIN A1C: 8.8 % — AB (ref 4.0–5.6)

## 2018-08-29 MED ORDER — INSULIN GLARGINE 100 UNIT/ML SOLOSTAR PEN: PEN_INJECTOR | SUBCUTANEOUS | 1 refills | Status: DC

## 2018-09-03 ENCOUNTER — Other Ambulatory Visit: Payer: Self-pay | Admitting: Endocrinology

## 2018-10-17 ENCOUNTER — Ambulatory Visit (INDEPENDENT_AMBULATORY_CARE_PROVIDER_SITE_OTHER): Payer: Medicare Other | Admitting: Endocrinology

## 2018-10-17 ENCOUNTER — Other Ambulatory Visit: Payer: Self-pay

## 2018-10-17 ENCOUNTER — Encounter: Payer: Self-pay | Admitting: Endocrinology

## 2018-10-17 VITALS — BP 120/58 | HR 57 | Ht 71.0 in | Wt 138.2 lb

## 2018-10-17 DIAGNOSIS — E1065 Type 1 diabetes mellitus with hyperglycemia: Secondary | ICD-10-CM

## 2018-10-17 DIAGNOSIS — H543 Unqualified visual loss, both eyes: Secondary | ICD-10-CM | POA: Diagnosis not present

## 2018-10-17 DIAGNOSIS — E103399 Type 1 diabetes mellitus with moderate nonproliferative diabetic retinopathy without macular edema, unspecified eye: Secondary | ICD-10-CM

## 2018-10-17 NOTE — Progress Notes (Signed)
Patient ID: Peter Mendez., male   DOB: 1961/05/04, 58 y.o.   MRN: [id number]   Reason for Appointment: Endocrinology follow-up   History of Present Illness   Diagnosis: Type 1 diabetes mellitus, diagnosis 1991.  He has been on basal bolus insulin regimen for a few years His A1c previously was consistently around 7-7.5 He tends to have significant fluctuation in blood sugars at all times. He has difficulty keeping his postprandial blood sugars consistent with mealtime insulin coverage and estimating the amount of insulin needed for various kinds of foods since he does not count carbohydrates A1c has been higher since 2014  Recent history:   The insulin regimen is  Lantus 28--14 units daily in am; Novolog 4 acb 6-8 acl & acs usually   A1c is higher at 8.8 as of 1/20  Current diabetes management, problems identified and blood sugar patterns  [name] continuing to be using the freestyle libre sensor nearly 90% of the time  Because of his higher blood sugars and tendency to lower readings overnight and higher readings during the day as [place] was changed to LANTUS  About a week ago he says he increase his Lantus by 4 units in the morning since his daytime readings were higher  However his blood sugars are still averaging over 200 throughout the day and only better between 12 AM and 4 AM  Not clear if he is adjusting his NovoLog based on his meal size also or his pre-meal blood sugar enough  He thinks he is taking his NovoLog consistently before eating  Does not appear to have any consistent spikes of his blood sugars after lunch or dinner  Last weekend he ran out of his Lantus and blood sugars are higher because of not having this for 3 days  Blood sugars are starting to improve with restarting Lantus and fasting glucose was 158 this morning  HYPOGLYCEMIA has been extremely rare  His appetite is normal but his weight is slightly lower than in November   Blood  Glucose readings: From monitor download as below.   CGM use % of time  89  2-week average/SD  208  Time in range  29     %  % Time Above 180  50  % Time above 250  20  % Time Below 70 1     PRE-MEAL Fasting Lunch Dinner Bedtime Overall  Glucose range:       Averages: ?  172   201  207  208   POST-MEAL PC Breakfast PC Lunch PC Dinner  Glucose range:     Averages:  222  213  230       Hypoglycemia awareness: Usually develops symptoms when blood glucose is less than 50, usually sweating.   Meals: Inconsistent schedule, Inconsistent quantity. Breakfast is at 6-7 am Supper 6-7 pm  Variable intake at breakfast, some oatmeal; 1/2 sandwich or peanut butter crackers at bedtime  Physical activity: exercise:  no more walking currently   Dietician visit: Most recent:, 4/09.   Wt Readings from Last 3 Encounters:  10/17/18 138 lb 3.2 oz (62.7 kg)  08/29/18 139 lb (63 kg)  06/29/18 142 lb (64.4 kg)    Lab Results  Component Value Date   HGBA1C 8.8 (A) 08/29/2018   HGBA1C 8.1 (H) 06/26/2018   HGBA1C 7.9 (A) 03/29/2018   Lab Results  Component Value Date   MICROALBUR 1.0 06/26/2018   LDLCALC 78 03/29/2018   CREATININE 0.78  06/26/2018    Other active problems: See review of systems     Allergies as of 10/17/2018      Reactions   Pravastatin Sodium    myalgia      Medication List       Accurate as of October 17, 2018 11:30 AM. Always use your most recent med list.        alprazolam 2 MG tablet Commonly known as:  XANAX take 1 tablet by mouth twice a day if needed for anxiety   FreeStyle Libre 14 Day Sensor Misc 1 Units by Does not apply route every 14 (fourteen) days.   gabapentin 600 MG tablet Commonly known as:  NEURONTIN Take 1 tablet 3 times a day as needed   ibuprofen 200 MG tablet Commonly known as:  ADVIL,MOTRIN Take 200 mg by mouth every 6 (six) hours as needed.   Insulin Pen Needle 31G X 8 MM Misc Commonly known as:  B-D ULTRAFINE III SHORT PEN  USE TO INJECT INSULIN 5 TIMES A DAY   Lantus SoloStar 100 UNIT/ML Solostar Pen Generic drug:  Insulin Glargine Inject into the skin daily. Inject 28 units under the skin in the morning at 12 units in the evening.   NovoLOG FlexPen 100 UNIT/ML FlexPen Generic drug:  insulin aspart Inject into the skin See admin instructions. Inject 5-6 units under the skin 2-3 times daily.   onetouch ultrasoft lancets Use as instructed to check blood sugars 7 times per day   oxycodone 30 MG immediate release tablet Commonly known as:  ROXICODONE 15 mg every 4 (four) hours as needed.   ramipril 5 MG capsule Commonly known as:  ALTACE Take 1 capsule (5 mg total) by mouth daily.   triamcinolone cream 0.1 % Commonly known as:  KENALOG Apply topically 2 (two) times daily.       Allergies:  Allergies  Allergen Reactions  . Pravastatin Sodium     myalgia    Past Medical History:  Diagnosis Date  . Chicken pox   . Claudication (HCC)   . Diabetes 1.5, managed as type 1 (HCC)   . Hypertension   . Low back pain   . Tobacco abuse     Past Surgical History:  Procedure Laterality Date  . CATARACT EXTRACTION Bilateral   . INTRAOCULAR LENS INSERTION Bilateral   . RETINAL DETACHMENT SURGERY Bilateral     Family History  Problem Relation Age of Onset  . Diabetes Mother   . Atrial fibrillation Mother   . Diabetes Maternal Uncle   . Diabetes Maternal Grandmother   . Heart attack Maternal Grandmother   . Esophageal cancer Maternal Grandfather   . Heart attack Father   . Stroke Father   . Arthritis Father   . Heart attack Sister        at age 51    Social History:  reports that he has been smoking cigarettes. He has been smoking about 0.50 packs per day. He has never used smokeless tobacco. He reports that he does not drink alcohol. No history on file for drug.  Review of Systems   HYPERTENSION: Blood pressure is controlled  Is on 5 mg ramipril  BP Readings from Last 3 Encounters:   10/17/18 (!) 120/58  08/29/18 128/68  06/29/18 (!) 152/84     Lipids: LDL has been well controlled, not on any statin drugs Tends to have low HDL   Lab Results  Component Value Date   CHOL 131 03/29/2018  HDL 44.10 03/29/2018   LDLCALC 78 03/29/2018   TRIG 42.0 03/29/2018   CHOLHDL 3 03/29/2018    HYPONATREMIA: Long-standing and asymptomatic. 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   Demeclocycline was prescribed previously but  not covered by insurance.  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     Assesment/PLAN:  Diabetes type 1, long-standing on basal bolus insulin regimen at that  See history of present illness for detailed discussion of current blood sugar patterns,  problems identified and current management   His A1c was 8.8 previously   He continues to have significant hyperglycemia except some readings overnight indicating inadequate basal insulin during the night This is despite trying Lantus insulin with the same doses as he was taking [place] previously Today his fasting glucose is 158 indicating that his evening Lantus is probably adequate but needs to increase his morning dosage significantly and progressively Also because of her tendency to blood sugar going up more quickly after breakfast he will need to take 6 units of NovoLog in the morning instead of 4 He will call if he has any difficulties but may need to go up further on his morning Lantus dose, for now he can try doing 30 units in the morning and 16 in the evening Discussed postprandial targets of at least under 180 at all meals Also he needs to increase his NovoLog by 2 to 4 units for larger meals or blood sugar elevation above target He will continue to use the freestyle libre sensor which has been useful   2. . Hypertension: Blood pressure is improved  Counseling time on subjects discussed in assessment and plan sections is over 50% of today's 25 minute visit  Recommend that he see his eye doctor since it has been a few years since he has been back and needs general exam also  Patient Instructions  Lantus 30 in am and 16 in pm  If suppertime sugar stays over 150 then go up to 32 lantus in am  Novolog 6 at [facility]  Get eye exam    Reather Littler 10/17/2018, 11:30 AM

## 2018-10-17 NOTE — Patient Instructions (Addendum)
Lantus 30 in am and 16 in pm  If suppertime sugar stays over 150 then go up to 32 lantus in am  Novolog 6 at [facility]  Get eye exam

## 2018-10-19 ENCOUNTER — Telehealth: Payer: Self-pay | Admitting: Endocrinology

## 2018-10-19 NOTE — Telephone Encounter (Signed)
error 

## 2018-11-08 ENCOUNTER — Other Ambulatory Visit: Payer: Self-pay

## 2018-11-08 ENCOUNTER — Telehealth: Payer: Self-pay | Admitting: Endocrinology

## 2018-11-08 MED ORDER — INSULIN GLARGINE 100 UNIT/ML SOLOSTAR PEN: PEN_INJECTOR | SUBCUTANEOUS | 3 refills | Status: DC

## 2018-11-08 NOTE — Telephone Encounter (Signed)
MEDICATION: Lantus Pens  PHARMACY:  [facility] on [address]  IS THIS A 90 DAY SUPPLY :  Needs 2 boxes  IS PATIENT OUT OF MEDICATION:   IF NOT; HOW MUCH IS LEFT:   LAST APPOINTMENT DATE: @3 /13/2020  NEXT APPOINTMENT DATE:@5 /13/2020  DO WE HAVE YOUR PERMISSION TO LEAVE A DETAILED MESSAGE: YES  OTHER COMMENTS:    **Let patient know to contact pharmacy at the end of the day to make sure medication is ready. **  ** Please notify patient to allow 48-72 hours to process**  **Encourage patient to contact the pharmacy for refills or they can request refills through [facility]**

## 2018-11-08 NOTE — Telephone Encounter (Signed)
Rx sent 

## 2018-12-19 ENCOUNTER — Other Ambulatory Visit: Payer: Self-pay

## 2018-12-19 ENCOUNTER — Ambulatory Visit (INDEPENDENT_AMBULATORY_CARE_PROVIDER_SITE_OTHER): Payer: Medicare Other | Admitting: Endocrinology

## 2018-12-19 ENCOUNTER — Encounter: Payer: Self-pay | Admitting: Endocrinology

## 2018-12-19 VITALS — BP 146/68 | HR 59 | Ht 71.0 in | Wt 136.2 lb

## 2018-12-19 DIAGNOSIS — E1065 Type 1 diabetes mellitus with hyperglycemia: Secondary | ICD-10-CM

## 2018-12-19 DIAGNOSIS — E871 Hypo-osmolality and hyponatremia: Secondary | ICD-10-CM | POA: Diagnosis not present

## 2018-12-19 DIAGNOSIS — E782 Mixed hyperlipidemia: Secondary | ICD-10-CM | POA: Diagnosis not present

## 2018-12-19 LAB — COMPREHENSIVE METABOLIC PANEL
ALT: 11 U/L (ref 0–53)
AST: 21 U/L (ref 0–37)
Albumin: 4 g/dL (ref 3.5–5.2)
Alkaline Phosphatase: 68 U/L (ref 39–117)
BUN: 9 mg/dL (ref 6–23)
CO2: 32 mEq/L (ref 19–32)
Calcium: 8.9 mg/dL (ref 8.4–10.5)
Chloride: 94 mEq/L — ABNORMAL LOW (ref 96–112)
Creatinine, Ser: 0.78 mg/dL (ref 0.40–1.50)
GFR: 101.76 mL/min (ref >60.00)
Glucose, Bld: 145 mg/dL — ABNORMAL HIGH (ref 70–99)
Potassium: 4.7 mEq/L (ref 3.5–5.1)
Sodium: 130 mEq/L — ABNORMAL LOW (ref 135–145)
Total Bilirubin: 0.6 mg/dL (ref 0.2–1.2)
Total Protein: 6.7 g/dL (ref 6.0–8.3)

## 2018-12-19 LAB — TSH: TSH: 1.27 u[IU]/mL (ref 0.35–4.50)

## 2018-12-19 LAB — POCT GLYCOSYLATED HEMOGLOBIN (HGB A1C): Hemoglobin A1C: 9.7 % — AB (ref 4.0–5.6)

## 2018-12-19 LAB — LIPID PANEL
Cholesterol: 147 mg/dL (ref 0–200)
HDL: 51.8 mg/dL (ref >39.00)
LDL Cholesterol: 88 mg/dL (ref 0–99)
NonHDL: 94.79
Total CHOL/HDL Ratio: 3
Triglycerides: 32 mg/dL (ref 0.0–149.0)
VLDL: 6.4 mg/dL (ref 0.0–40.0)

## 2018-12-19 NOTE — Progress Notes (Signed)
Patient ID: Peter Mendez., male   DOB: 1961/01/30, 58 y.o.   MRN: [id number]   Reason for Appointment: Endocrinology follow-up   History of Present Illness   Diagnosis: Type 1 diabetes mellitus, diagnosis 1991.  He has been on basal bolus insulin regimen for a few years His A1c previously was consistently around 7-7.5 He tends to have significant fluctuation in blood sugars at all times. He has difficulty keeping his postprandial blood sugars consistent with mealtime insulin coverage and estimating the amount of insulin needed for various kinds of foods since he does not count carbohydrates A1c has been higher since 2014  Recent history:   The insulin regimen is  Lantus 28--12 units daily in am; Novolog 4 acb 6-8 acl & acs usually   A1c is the highest in a while at 9.7, was at 8.8 previously  Current diabetes management, problems identified and blood sugar patterns  He was told to increase his Lantus by 2 to 4 units and continue to increase the dose until daytime sugars are better but he has not changed his insulin at all; he says that he will rely on his mother to given his insulin doses but she did not apparently read the after visit instructions  Because of his higher blood sugars and tendency to lower readings overnight and higher readings during the day his Evaristo [place] was changed to LANTUS previously  He still has 1 box of Lantus at home  As before he has not changed his NovoLog based on his blood sugars as much and still taking only 4 units at breakfast instead of the recommended dose of 6 units  As discussed below his blood sugars are averaging just over 200 throughout the day and only better after midnight  Postprandial readings are mostly higher after breakfast or if he is having a snack without coverage during the night  Periodically will have low normal or low readings early morning  Day-to-day variability is significant but mostly overnight and before dinner   [name] not very active recently   CONTINUOUS GLUCOSE MONITORING RECORD INTERPRETATION    Dates of Recording: April 30-Dec 19, 2018, printed on 12/19/2018  Sensor description: [employer]  Results statistics:   CGM use % of time  96  Average and SD  209  Time in range  25     %, was 29  % Time Above 180  53  % Time above 250  20  % Time Below target 2    Glycemic patterns summary:   His average blood sugars are above the target range most of the time except between midnight-2 AM Blood sugar average ranges between 169 at midnight and generally in the low 200s 8 hours a day  Highest blood sugar average = 231 between 8-10 AM There is more variability in blood sugars before dinnertime No consistent spikes in blood sugar seen at any given time  Hyperglycemic episodes are occurring almost daily and are most significant when he is rebounding from low normal or low sugars early morning and occasionally before dinnertime Otherwise no consistent postprandial blood sugar spikes are evident except during the night when likely he is having a snack for low normal sugars without insulin  Hypoglycemic episodes occurred sporadically before breakfast and low normal readings before dinnertime on 2 occasions  Overnight periods: Sugars are mildly increased at midnight but tend to rise overall on 3 AM progressively until at 8 AM Hypoglycemia occurred only on 5/12 before  6 AM for about 4 hours  Preprandial periods: Fasting blood sugars at breakfast time are averaging just over 200 and about the same as after meals with more variability at dinnertime  Postprandial periods: Blood sugars are relatively flat before and after eating No significant spike seen in blood sugars except as above when he has low normal sugars before breakfast Occasionally blood sugar spikes may occur in the evenings and less prominently after lunch     Hypoglycemia awareness: Usually develops symptoms when blood glucose  is less than 50, usually sweating.   Meals: Inconsistent schedule, Inconsistent quantity. Breakfast is at 6-7 am Supper 6-7 pm  Variable intake at breakfast, some oatmeal; 1/2 sandwich or peanut butter crackers at bedtime  Physical activity: exercise:  no more walking currently   Dietician visit: Most recent:, 4/09.   Wt Readings from Last 3 Encounters:  12/19/18 136 lb 3.2 oz (61.8 kg)  10/17/18 138 lb 3.2 oz (62.7 kg)  08/29/18 139 lb (63 kg)    Lab Results  Component Value Date   HGBA1C 9.7 (A) 12/19/2018   HGBA1C 8.8 (A) 08/29/2018   HGBA1C 8.1 (H) 06/26/2018   Lab Results  Component Value Date   MICROALBUR 1.0 06/26/2018   LDLCALC 78 03/29/2018   CREATININE 0.78 06/26/2018    Other active problems: See review of systems     Allergies as of 12/19/2018      Reactions   Pravastatin Sodium    myalgia      Medication List       Accurate as of Dec 19, 2018 11:45 AM. If you have any questions, ask your nurse or doctor.        alprazolam 2 MG tablet Commonly known as:  XANAX take 1 tablet by mouth twice a day if needed for anxiety What changed:  additional instructions   FreeStyle Libre 14 Day Sensor Misc 1 Units by Does not apply route every 14 (fourteen) days.   gabapentin 600 MG tablet Commonly known as:  NEURONTIN Take 1 tablet 3 times a day as needed   ibuprofen 200 MG tablet Commonly known as:  ADVIL Take 200 mg by mouth every 6 (six) hours as needed.   Insulin Glargine 100 UNIT/ML Solostar Pen Commonly known as:  Lantus SoloStar Inject 28 units under the skin in the morning at 12 units in the evening.   Insulin Pen Needle 31G X 8 MM Misc Commonly known as:  B-D ULTRAFINE III SHORT PEN USE TO INJECT INSULIN 5 TIMES A DAY   NovoLOG FlexPen 100 UNIT/ML FlexPen Generic drug:  insulin aspart Inject into the skin See admin instructions. Inject 5-6 units under the skin 2-3 times daily.   onetouch ultrasoft lancets Use as instructed to check blood  sugars 7 times per day   oxycodone 30 MG immediate release tablet Commonly known as:  ROXICODONE 15 mg every 4 (four) hours as needed.   ramipril 5 MG capsule Commonly known as:  ALTACE Take 1 capsule (5 mg total) by mouth daily.   triamcinolone cream 0.1 % Commonly known as:  KENALOG Apply topically 2 (two) times daily.       Allergies:  Allergies  Allergen Reactions  . Pravastatin Sodium     myalgia    Past Medical History:  Diagnosis Date  . Chicken pox   . Claudication (HCC)   . Diabetes 1.5, managed as type 1 (HCC)   . Hypertension   . Low back pain   . Tobacco  abuse     Past Surgical History:  Procedure Laterality Date  . CATARACT EXTRACTION Bilateral   . INTRAOCULAR LENS INSERTION Bilateral   . RETINAL DETACHMENT SURGERY Bilateral     Family History  Problem Relation Age of Onset  . Diabetes Mother   . Atrial fibrillation Mother   . Diabetes Maternal Uncle   . Diabetes Maternal Grandmother   . Heart attack Maternal Grandmother   . Esophageal cancer Maternal Grandfather   . Heart attack Father   . Stroke Father   . Arthritis Father   . Heart attack Sister        at age 59    Social History:  reports that he has been smoking cigarettes. He has been smoking about 0.50 packs per day. He has never used smokeless tobacco. He reports that he does not drink alcohol. No history on file for drug.  Review of Systems   HYPERTENSION: Blood pressure is controlled although fluctuates  Is on 5 mg ramipril  Occasionally checking at home also  BP Readings from Last 3 Encounters:  12/19/18 (!) 146/68  10/17/18 (!) 120/58  08/29/18 128/68     Lipids: LDL has been well controlled, not on any statin drugs Tends to have low HDL   Lab Results  Component Value Date   CHOL 131 03/29/2018   HDL 44.10 03/29/2018   LDLCALC 78 03/29/2018   TRIG 42.0 03/29/2018   CHOLHDL 3 03/29/2018    HYPONATREMIA: Long-standing and asymptomatic.  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   Demeclocycline was prescribed previously but  not covered by insurance.  Cortrosyn stimulation test was also normal   Urinary sodium previously was 138 and U Osm 245  [name] consistently asymptomatic with no nausea or decreased appetite He has variable compliance with fluid restriction that has been recommended   Lab Results  Component Value Date   CREATININE 0.78 06/26/2018   BUN 8 06/26/2018   NA 130 (L) 06/26/2018   K 4.9 06/26/2018   CL 94 (L) 06/26/2018   CO2 33 (H) 06/26/2018    Chronic back pain. 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     Assesment/PLAN:  Diabetes type 1, long-standing on basal bolus insulin regimen at that  See history of present illness for detailed discussion of current blood sugar patterns, problems identified and current management  His A1c was 8.8 previously and is now 9.7  His blood sugars are more variable with Lantus and still tending to have higher readings during the day without obvious postprandial peaks except sometimes at breakfast or with nighttime snacks He did not follow instructions for changing his insulin as directed on the last visit [name] relying on  his mother to read the instructions to him and this was not done on the last visit  Today we discussed  in detail the changes needed in his Lantus insulin He will go up at least 4 units for now on the morning Lantus while reducing the evening dose to 10 units to reduce nocturnal low sugars He will need to continue increasing his morning Lantus until suppertime blood sugar is below 150 Also needs 3 units for any large snack anytime Also increase breakfast dose to 6 units NovoLog May benefit from more regular walking for exercise during the day but he does have 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 . Hypertension: Blood pressure is high normal but usually variable  3.  Idiopathic hyponatremia: Needs follow-up levels checked As before needs to limit fluid intake  4.  Needs follow-up lipids  Counseling time on subjects discussed in assessment and plan sections is over 50% of today's 25 minute visit    Patient Instructions  Take 6 Novolog at breakfast  If am sugar is staying over 150 then will go up to 12 units of the Lantus in the evening  If   suppertime blood sugar is staying over 150 after 5 days then go up to 34 units on the morning Lantus  If his sugar is below 110 during the night only have half a cup of milk and no snack.    if having a snack with peanut butter crackers and milk then you need 3 units of NovoLog for that  Change to Tresiba insulin 42 units daily when Lantus is finished     Reather Littler 12/19/2018, 11:45 AM

## 2018-12-19 NOTE — Patient Instructions (Addendum)
Take 6 Novolog at breakfast  If am sugar is staying over 150 then will go up to 12 units of the Lantus in the evening  If   suppertime blood sugar is staying over 150 after 5 days then go up to 34 units on the morning Lantus  If his sugar is below 110 during the night only have half a cup of milk and no snack.    if having a snack with peanut butter crackers and milk then you need 3 units of NovoLog for that  Change to Tresiba insulin 42 units daily when Lantus is finished

## 2019-02-20 ENCOUNTER — Ambulatory Visit: Payer: Medicare Other | Admitting: Endocrinology

## 2019-03-29 ENCOUNTER — Other Ambulatory Visit: Payer: Self-pay | Admitting: Endocrinology

## 2019-04-12 ENCOUNTER — Other Ambulatory Visit: Payer: Self-pay

## 2019-04-17 ENCOUNTER — Other Ambulatory Visit: Payer: Self-pay

## 2019-04-17 ENCOUNTER — Encounter: Payer: Self-pay | Admitting: Endocrinology

## 2019-04-17 ENCOUNTER — Ambulatory Visit (INDEPENDENT_AMBULATORY_CARE_PROVIDER_SITE_OTHER): Payer: Medicare Other | Admitting: Endocrinology

## 2019-04-17 VITALS — BP 128/60 | HR 68 | Ht 71.0 in | Wt 130.6 lb

## 2019-04-17 DIAGNOSIS — E1065 Type 1 diabetes mellitus with hyperglycemia: Secondary | ICD-10-CM

## 2019-04-17 DIAGNOSIS — Z23 Encounter for immunization: Secondary | ICD-10-CM

## 2019-04-17 LAB — MICROALBUMIN / CREATININE URINE RATIO
Creatinine,U: 142.8 mg/dL
Microalb Creat Ratio: 8.7 mg/g (ref 0.0–30.0)
Microalb, Ur: 12.5 mg/dL — ABNORMAL HIGH (ref 0.0–1.9)

## 2019-04-17 LAB — POCT GLYCOSYLATED HEMOGLOBIN (HGB A1C): Hemoglobin A1C: 9.1 % — AB (ref 4.0–5.6)

## 2019-04-17 LAB — COMPREHENSIVE METABOLIC PANEL
ALT: 9 U/L (ref 0–53)
AST: 16 U/L (ref 0–37)
Albumin: 4 g/dL (ref 3.5–5.2)
Alkaline Phosphatase: 65 U/L (ref 39–117)
BUN: 12 mg/dL (ref 6–23)
CO2: 30 mEq/L (ref 19–32)
Calcium: 9.4 mg/dL (ref 8.4–10.5)
Chloride: 91 mEq/L — ABNORMAL LOW (ref 96–112)
Creatinine, Ser: 0.87 mg/dL (ref 0.40–1.50)
GFR: 89.61 mL/min (ref >60.00)
Glucose, Bld: 270 mg/dL — ABNORMAL HIGH (ref 70–99)
Potassium: 4.4 mEq/L (ref 3.5–5.1)
Sodium: 127 mEq/L — ABNORMAL LOW (ref 135–145)
Total Bilirubin: 0.6 mg/dL (ref 0.2–1.2)
Total Protein: 7 g/dL (ref 6.0–8.3)

## 2019-04-17 LAB — URINALYSIS, ROUTINE W REFLEX MICROSCOPIC
Hgb urine dipstick: NEGATIVE
Leukocytes,Ua: NEGATIVE
Nitrite: NEGATIVE
Specific Gravity, Urine: 1.025 (ref 1.000–1.030)
Urine Glucose: 500 — AB
Urobilinogen, UA: 1 (ref 0.0–1.0)
pH: 6 (ref 5.0–8.0)

## 2019-04-17 MED ORDER — ROSUVASTATIN CALCIUM 5 MG PO TABS: 5.0000 mg | ORAL_TABLET | Freq: Every day | ORAL | 3 refills | Status: DC

## 2019-04-17 NOTE — Progress Notes (Signed)
Patient ID: Peter Mendez., male   DOB: Jul 27, 1961, 58 y.o.   MRN: [id number]   Reason for Appointment: Endocrinology follow-up   History of Present Illness   Diagnosis: Type 1 diabetes mellitus, diagnosis 1991.  He has been on basal bolus insulin regimen for a few years His A1c previously was consistently around 7-7.5 He tends to have significant fluctuation in blood sugars at all times. He has difficulty keeping his postprandial blood sugars consistent with mealtime insulin coverage and estimating the amount of insulin needed for various kinds of foods since he does not count carbohydrates A1c has been higher since 2014  Recent history:   The insulin regimen is  Lantus 30--12 units daily in am; Novolog 4-6 acb 6-10 acl & acs usually   A1c is 9.1, was 9.7  Current diabetes management, problems identified and blood sugar patterns  He was told to switch back to [place] but he has had a large supply of Lantus and is still using this  Although overall appears to need more basal insulin with his blood sugars generally running in the 160-180 average throughout the day and night he has moderate variability  Has sporadic low sugars as discussed below  Although he does not think he is having excessive snacks during the night he does have the highest readings around 6 AM before breakfast probably from nighttime snacks without any coverage  Also recently eating more cereal in the morning which may tend to cause sometimes low normal sugars at midday    CONTINUOUS GLUCOSE MONITORING RECORD INTERPRETATION    Dates of Recording: 8/27 through 9/9  Sensor description: [employer]  Results statistics:   CGM use % of time  97  Average and SD  180, GV 32  Time in range  46     %  % Time Above 180  40  % Time above 250  10  % Time Below target  for    Glycemic patterns summary: Overall average blood sugars are mostly on 180 with moderate fluctuation Has marked  variability overnight with but already spikes in blood sugar after 3 AM Otherwise postprandial readings are not consistently showing any hypoglycemia Hypoglycemia has been minimal except transiently possibly from overcorrection  Hyperglycemic episodes are occurring consistently during the night, midafternoon  Hypoglycemic episodes occurred transiently about noon twice and overnight once after 11 PM  Overnight periods: Blood sugars are quite variable with periodic spikes during the night Hypoglycemia occurred on the night of 9/7 starting after 11 PM through 4 AM  Preprandial periods: Blood sugars are mostly high before breakfast Glucose at lunchtime is quite variable but averaging about 160 Glucose at suppertime is generally high also around 170-180 with some variability  Postprandial periods:   After breakfast: Blood sugars are generally declining compared to pre-meal blood sugar  After lunch:   Usually glucose is fairly level except occasionally rising significantly from low normal low normal reading After dinner: Blood sugars are mostly flat and no significant rise in blood sugar after the meal Blood sugar declined after evening meal on 9/6 into hypoglycemic range   PRE-MEAL Fasting Lunch Dinner Bedtime Overall  Glucose range:       Mean/median:  200  162  184  158  180   POST-MEAL PC Breakfast PC Lunch PC Dinner  Glucose range:     Mean/median:  193  182  185     Previous statistics:   CGM use % of time  96  Average and SD  209  Time in range  25     %, was 29  % Time Above 180  53  % Time above 250  20  % Time Below target 2      Hypoglycemia awareness: Usually develops symptoms when blood glucose is less than 50, usually sweating.   Meals: Inconsistent schedule, Inconsistent quantity. Breakfast is at 6-7 am Supper 6-7 pm  Variable intake at breakfast, some oatmeal; 1/2 sandwich or peanut butter crackers at bedtime   Dietician visit: Most recent:, 4/09.   Wt  Readings from Last 3 Encounters:  04/17/19 130 lb 9.6 oz (59.2 kg)  12/19/18 136 lb 3.2 oz (61.8 kg)  10/17/18 138 lb 3.2 oz (62.7 kg)    Lab Results  Component Value Date   HGBA1C 9.1 (A) 04/17/2019   HGBA1C 9.7 (A) 12/19/2018   HGBA1C 8.8 (A) 08/29/2018   Lab Results  Component Value Date   MICROALBUR 1.0 06/26/2018   LDLCALC 88 12/19/2018   CREATININE 0.78 12/19/2018    Other active problems: See review of systems     Allergies as of 04/17/2019      Reactions   Pravastatin Sodium    myalgia      Medication List       Accurate as of April 17, 2019  2:00 PM. If you have any questions, ask your nurse or doctor.        alprazolam 2 MG tablet Commonly known as: XANAX take 1 tablet by mouth twice a day if needed for anxiety What changed: additional instructions   FreeStyle Libre 14 Day Sensor Misc 1 Units by Does not apply route every 14 (fourteen) days.   gabapentin 600 MG tablet Commonly known as: NEURONTIN Take 1 tablet 3 times a day as needed   ibuprofen 200 MG tablet Commonly known as: ADVIL Take 200 mg by mouth every 6 (six) hours as needed.   Insulin Glargine 100 UNIT/ML Solostar Pen Commonly known as: Lantus SoloStar Inject 28 units under the skin in the morning at 12 units in the evening. What changed: additional instructions   Insulin Pen Needle 31G X 8 MM Misc Commonly known as: B-D ULTRAFINE III SHORT PEN USE TO INJECT INSULIN 5 TIMES A DAY   NovoLOG FlexPen 100 UNIT/ML FlexPen Generic drug: insulin aspart Inject 6-10 units before meals   onetouch ultrasoft lancets Use as instructed to check blood sugars 7 times per day   oxycodone 30 MG immediate release tablet Commonly known as: ROXICODONE 15 mg every 4 (four) hours as needed.   ramipril 5 MG capsule Commonly known as: ALTACE Take 1 capsule (5 mg total) by mouth daily.   triamcinolone cream 0.1 % Commonly known as: KENALOG Apply topically 2 (two) times daily.        Allergies:  Allergies  Allergen Reactions  . Pravastatin Sodium     myalgia    Past Medical History:  Diagnosis Date  . Chicken pox   . Claudication (HCC)   . Diabetes 1.5, managed as type 1 (HCC)   . Hypertension   . Low back pain   . Tobacco abuse     Past Surgical History:  Procedure Laterality Date  . CATARACT EXTRACTION Bilateral   . INTRAOCULAR LENS INSERTION Bilateral   . RETINAL DETACHMENT SURGERY Bilateral     Family History  Problem Relation Age of Onset  . Diabetes Mother   . Atrial fibrillation Mother   . Diabetes Maternal  Uncle   . Diabetes Maternal Grandmother   . Heart attack Maternal Grandmother   . Esophageal cancer Maternal Grandfather   . Heart attack Father   . Stroke Father   . Arthritis Father   . Heart attack Sister        at age 59    Social History:  reports that he has been smoking cigarettes. He has been smoking about 0.50 packs per day. He has never used smokeless tobacco. He reports that he does not drink alcohol. No history on file for drug.  Review of Systems   HYPERTENSION: Blood pressure is controlled although fluctuates  Is on 5 mg ramipril  Occasionally checking at home   BP Readings from Last 3 Encounters:  04/17/19 128/60  12/19/18 (!) 146/68  10/17/18 (!) 120/58     Lipids: LDL has been controlled, not on any statin drugs Previously apparently had some leg aches with pravastatin   Lab Results  Component Value Date   CHOL 147 12/19/2018   HDL 51.80 12/19/2018   LDLCALC 88 12/19/2018   TRIG 32.0 12/19/2018   CHOLHDL 3 12/19/2018    HYPONATREMIA: Long-standing and asymptomatic.  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   Demeclocycline was prescribed previously but  not covered by insurance.  Cortrosyn stimulation test was also normal   Urinary  sodium previously was 138 and U Osm 245  [name] consistently asymptomatic with no nausea or decreased appetite He has variable compliance with fluid restriction that has been recommended   Lab Results  Component Value Date   CREATININE 0.78 12/19/2018   BUN 9 12/19/2018   NA 130 (L) 12/19/2018   K 4.7 12/19/2018   CL 94 (L) 12/19/2018   CO2 32 12/19/2018    Chronic back pain. As before taking narcotic medications as needed upto every 4 hours, he is on oxycodone from his pain clinic  Visual loss from retinopathy, currently not followed by ophthalmologist  He has had numbness and shooting pains in his legs, treated with gabapentin Has absent sensation in the feet on exam      Examination:   BP 128/60 (BP Location: Left Arm, Patient Position: Sitting, Cuff Size: Normal)   Pulse 68   Ht 5\' 11"  (1.803 m)   Wt 130 lb 9.6 oz (59.2 kg)   SpO2 98%   BMI 18.22 kg/m   Body mass index is 18.22 kg/m.     Assesment/PLAN:  Diabetes type 1, long-standing on basal bolus insulin regimen at that  See history of present illness for detailed discussion of current blood sugar patterns, problems identified and current management  His A1c was 9.7 and is improving now at 9.1  However as before his blood sugars fluctuate significantly especially overnight This may be partly from the use of Lantus He wants to use of his Lantus before switching back to [place] which slightly will do better In the meantime he can change his Lantus to bedtime instead of 7 PM since blood sugar patterns indicate blood sugars start going up after about 4 AM Also he will increase the dose to 14 units Explained to him that he is likely getting too many carbohydrates if he is having snacks during the night   2. . Hypertension: Blood pressure is normal but appears variable  3.  Idiopathic  hyponatremia: Needs follow-up sodium checked today As before needs to limit fluid intake  4.  Needs cardiovascular prophylaxis  based on his history of diabetes and strong family history He agrees to try every other day Crestor and if tolerated increase to once a day Follow-up lipids on the next visit  Counseling time on subjects discussed in assessment and plan sections is over 50% of today's 25 minute visit    There are no Patient Instructions on file for this visit.   Reather LittlerAjay Delania Ferg 04/17/2019, 2:00 PM

## 2019-04-17 NOTE — Patient Instructions (Addendum)
Take 14 units Lantus at bedtime  Extra 1 unit extra per 50 pts over 100

## 2019-07-12 ENCOUNTER — Other Ambulatory Visit: Payer: Self-pay

## 2019-07-16 ENCOUNTER — Encounter: Payer: Self-pay | Admitting: Endocrinology

## 2019-07-16 ENCOUNTER — Other Ambulatory Visit: Payer: Self-pay

## 2019-07-16 ENCOUNTER — Ambulatory Visit (INDEPENDENT_AMBULATORY_CARE_PROVIDER_SITE_OTHER): Payer: Medicare Other | Admitting: Endocrinology

## 2019-07-16 VITALS — BP 150/60 | HR 56 | Ht 71.0 in | Wt 147.6 lb

## 2019-07-16 DIAGNOSIS — E1065 Type 1 diabetes mellitus with hyperglycemia: Secondary | ICD-10-CM

## 2019-07-16 DIAGNOSIS — E871 Hypo-osmolality and hyponatremia: Secondary | ICD-10-CM

## 2019-07-16 DIAGNOSIS — E782 Mixed hyperlipidemia: Secondary | ICD-10-CM | POA: Diagnosis not present

## 2019-07-16 DIAGNOSIS — E222 Syndrome of inappropriate secretion of antidiuretic hormone: Secondary | ICD-10-CM

## 2019-07-16 DIAGNOSIS — Z122 Encounter for screening for malignant neoplasm of respiratory organs: Secondary | ICD-10-CM

## 2019-07-16 DIAGNOSIS — Z87891 Personal history of nicotine dependence: Secondary | ICD-10-CM

## 2019-07-16 LAB — COMPREHENSIVE METABOLIC PANEL
ALT: 13 U/L (ref 0–53)
AST: 22 U/L (ref 0–37)
Albumin: 3.9 g/dL (ref 3.5–5.2)
Alkaline Phosphatase: 75 U/L (ref 39–117)
BUN: 10 mg/dL (ref 6–23)
CO2: 32 mEq/L (ref 19–32)
Calcium: 8.8 mg/dL (ref 8.4–10.5)
Chloride: 95 mEq/L — ABNORMAL LOW (ref 96–112)
Creatinine, Ser: 0.77 mg/dL (ref 0.40–1.50)
GFR: 103.08 mL/min (ref >60.00)
Glucose, Bld: 171 mg/dL — ABNORMAL HIGH (ref 70–99)
Potassium: 4.7 mEq/L (ref 3.5–5.1)
Sodium: 130 mEq/L — ABNORMAL LOW (ref 135–145)
Total Bilirubin: 0.6 mg/dL (ref 0.2–1.2)
Total Protein: 6.4 g/dL (ref 6.0–8.3)

## 2019-07-16 LAB — POCT GLYCOSYLATED HEMOGLOBIN (HGB A1C): Hemoglobin A1C: 8.6 % — AB (ref 4.0–5.6)

## 2019-07-16 LAB — LIPID PANEL
Cholesterol: 122 mg/dL (ref 0–200)
HDL: 49.7 mg/dL (ref >39.00)
LDL Cholesterol: 66 mg/dL (ref 0–99)
NonHDL: 72.06
Total CHOL/HDL Ratio: 2
Triglycerides: 29 mg/dL (ref 0.0–149.0)
VLDL: 5.8 mg/dL (ref 0.0–40.0)

## 2019-07-16 NOTE — Patient Instructions (Addendum)
For pizza take 8-10 Novolog and extra 2-3 umits at 10 pm   For fried food 8 units  Tresiba 32 units daily and go up or down 2 units every 3 days if sugar >150 or < 90 in am

## 2019-07-16 NOTE — Progress Notes (Signed)
Patient ID: Peter Rams., male   DOB: [birth year], 58 y.o.   MRN: [id number]   Reason for Appointment: Endocrinology follow-up   History of Present Illness   Diagnosis: Type 1 diabetes mellitus, diagnosis 1991.  He has been on basal bolus insulin regimen for a few years His A1c previously was consistently around 7-7.5 He tends to have significant fluctuation in blood sugars at all times. He has difficulty keeping his postprandial blood sugars consistent with mealtime insulin coverage and estimating the amount of insulin needed for various kinds of foods since he does not count carbohydrates A1c has been higher since 2014  Recent history:   The insulin regimen is  Lantus 28--12 units daily in am; Novolog 4-6 acb 6-10 acl & acs usually   A1c is 8.6, gradually improving from highest level of 9.7 this year   Current diabetes management, problems identified and blood sugar patterns  He was told to switch back to [place] but he has still not done so and called for a new prescription  His blood sugars show significant variability as before  However the time within range has improved compared to last.  He will sometimes eat higher fat meals and does not take more than 6 units of NovoLog which will cause his blood sugars to be significantly higher and stay up all night  Last night had small amount of carbohydrate with: Bread and potatoes same with 4 units of NovoLog his blood sugar was gradually decreasing but got low normal only after about 11 PM, usually has dinner at 6 PM  Hypoglycemia has been minimal  Although he sometimes has a higher reading in the middle of the night he does not think he is getting any snacks during the night  He appears to have gained back some of the weight he had lost   CONTINUOUS GLUCOSE MONITORING RECORD INTERPRETATION    Dates of Recording: 11/25 through 12/8  Sensor description: Fre[employer] libre 1  Results statistics:   CGM use % of  time  98  Average and SD 174  Time in range        52%, was 46  % Time Above 180  37  % Time above 250  8  % Time Below target  3    PRE-MEAL Fasting Lunch Dinner Bedtime Overall  Glucose range:       Mean/median:  169  166  180  174    POST-MEAL PC Breakfast PC Lunch PC Dinner  Glucose range:     Mean/median:  160  178  198    Glycemic patterns summary: On an average blood sugars are within the target range except after dinner However blood sugars do show variability after meals especially breakfast and dinner Frequently will have persistently high readings throughout the day  Hyperglycemic episodes are occurring periodically after meals but no consistent pattern Occasionally blood sugars are persistently high through the night also  Hypoglycemic episodes occurred infrequently and transiently, may occur randomly around 10 AM, 12 noon or 12 AM  Overnight periods: Blood sugars are quite variable and show a rise between about 2 AM until 6 AM and minimal hypoglycemia  Preprandial periods: Blood sugars are moderately high on an average at all meals averaging between 160-180  Postprandial periods:   After breakfast:   Blood sugar may occasionally decrease by lunchtime but occasionally spike higher  After lunch: Blood sugars are generally flat compared to premeal readings except once or twice  After dinner: Glucose readings are quite variable with some days significant hypoglycemia which may persist into the night Last night blood sugar was low normal around 11 PM    CONTINUOUS GLUCOSE MONITORING RECORD INTERPRETATION    Dates of Recording: 8/27 through 9/9  Sensor description: [employer]  Results statistics:   CGM use % of time  97  Average and SD  180, GV 32  Time in range  46     %  % Time Above 180  40  % Time above 250  10  % Time Below target  for    Glycemic patterns summary: Overall average blood sugars are mostly on 180 with moderate fluctuation Has  marked variability overnight with but already spikes in blood sugar after 3 AM Otherwise postprandial readings are not consistently showing any hypoglycemia Hypoglycemia has been minimal except transiently possibly from overcorrection  Hyperglycemic episodes are occurring consistently during the night, midafternoon  Hypoglycemic episodes occurred transiently about noon twice and overnight once after 11 PM  Overnight periods: Blood sugars are quite variable with periodic spikes during the night Hypoglycemia occurred on the night of 9/7 starting after 11 PM through 4 AM  Preprandial periods: Blood sugars are mostly high before breakfast Glucose at lunchtime is quite variable but averaging about 160 Glucose at suppertime is generally high also around 170-180 with some variability  Postprandial periods:   After breakfast: Blood sugars are generally declining compared to pre-meal blood sugar  After lunch:   Usually glucose is fairly level except occasionally rising significantly from low normal low normal reading After dinner: Blood sugars are mostly flat and no significant rise in blood sugar after the meal Blood sugar declined after evening meal on 9/6 into hypoglycemic range   PRE-MEAL Fasting Lunch Dinner Bedtime Overall  Glucose range:       Mean/median:  200  162  184  158  180   POST-MEAL PC Breakfast PC Lunch PC Dinner  Glucose range:     Mean/median:  193  182  185     Previous statistics:   CGM use % of time  96  Average and SD  209  Time in range  25     %, was 29  % Time Above 180  53  % Time above 250  20  % Time Below target 2      Hypoglycemia awareness: Usually develops symptoms when blood glucose is less than 50, usually sweating.   Meals: Inconsistent schedule, Inconsistent quantity. Breakfast is at 6-7 am Supper 6-7 pm  Variable intake at breakfast, some oatmeal; 1/2 sandwich or peanut butter crackers at bedtime   Dietician visit: Most recent:, 4/09.    Wt Readings from Last 3 Encounters:  07/16/19 147 lb 9.6 oz (67 kg)  04/17/19 130 lb 9.6 oz (59.2 kg)  12/19/18 136 lb 3.2 oz (61.8 kg)    Lab Results  Component Value Date   HGBA1C 8.6 (A) 07/16/2019   HGBA1C 9.1 (A) 04/17/2019   HGBA1C 9.7 (A) 12/19/2018   Lab Results  Component Value Date   MICROALBUR 12.5 (H) 04/17/2019   LDLCALC 88 12/19/2018   CREATININE 0.87 04/17/2019    Other active problems: See review of systems     Allergies as of 07/16/2019      Reactions   Pravastatin Sodium    myalgia      Medication List       Accurate as of July 16, 2019  2:54 PM.  If you have any questions, ask your nurse or doctor.        alprazolam 2 MG tablet Commonly known as: XANAX take 1 tablet by mouth twice a day if needed for anxiety What changed: additional instructions   FreeStyle Libre 14 Day Sensor Misc 1 Units by Does not apply route every 14 (fourteen) days.   gabapentin 600 MG tablet Commonly known as: NEURONTIN Take 1 tablet 3 times a day as needed   ibuprofen 200 MG tablet Commonly known as: ADVIL Take 200 mg by mouth every 6 (six) hours as needed.   Insulin Glargine 100 UNIT/ML Solostar Pen Commonly known as: Lantus SoloStar Inject 28 units under the skin in the morning at 12 units in the evening. What changed: additional instructions   Insulin Pen Needle 31G X 8 MM Misc Commonly known as: B-D ULTRAFINE III SHORT PEN USE TO INJECT INSULIN 5 TIMES A DAY   NovoLOG FlexPen 100 UNIT/ML FlexPen Generic drug: insulin aspart Inject 6-10 units before meals   onetouch ultrasoft lancets Use as instructed to check blood sugars 7 times per day   oxycodone 30 MG immediate release tablet Commonly known as: ROXICODONE 15 mg every 4 (four) hours as needed.   ramipril 5 MG capsule Commonly known as: ALTACE Take 1 capsule (5 mg total) by mouth daily.   rosuvastatin 5 MG tablet Commonly known as: Crestor Take 1 tablet (5 mg total) by mouth daily.    triamcinolone cream 0.1 % Commonly known as: KENALOG Apply topically 2 (two) times daily.       Allergies:  Allergies  Allergen Reactions  . Pravastatin Sodium     myalgia    Past Medical History:  Diagnosis Date  . Chicken pox   . Claudication (HCC)   . Diabetes 1.5, managed as type 1 (HCC)   . Hypertension   . Low back pain   . Tobacco abuse     Past Surgical History:  Procedure Laterality Date  . CATARACT EXTRACTION Bilateral   . INTRAOCULAR LENS INSERTION Bilateral   . RETINAL DETACHMENT SURGERY Bilateral     Family History  Problem Relation Age of Onset  . Diabetes Mother   . Atrial fibrillation Mother   . Heart attack Father   . Stroke Father   . Arthritis Father   . Heart disease Father   . Diabetes Maternal Uncle   . Diabetes Maternal Grandmother   . Heart attack Maternal Grandmother   . Heart disease Maternal Grandmother   . Esophageal cancer Maternal Grandfather   . Heart attack Sister        at age 77    Social History:  reports that he has been smoking cigarettes. He has been smoking about 0.50 packs per day. He has never used smokeless tobacco. He reports that he does not drink alcohol. No history on file for drug.  Review of Systems   HYPERTENSION: Blood pressure is controlled although fluctuates  Is on 5 mg ramipril  Occasionally checking at home: Before coming here it was 130/70, appears to be stressed today  BP Readings from Last 3 Encounters:  07/16/19 (!) 150/60  04/17/19 128/60  12/19/18 (!) 146/68     Lipids: Not taking Crestor every other day for cardiovascular risk reduction, he thinks he is taking his regularly   Lab Results  Component Value Date   CHOL 147 12/19/2018   HDL 51.80 12/19/2018   LDLCALC 88 12/19/2018   TRIG 32.0 12/19/2018   CHOLHDL  3 12/19/2018    HYPONATREMIA: Long-standing and asymptomatic.  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   Demeclocycline was prescribed previously but  not covered by insurance.  Cortrosyn stimulation test was also normal   Urinary sodium previously was 138 and U Osm 245  [name] again asymptomatic with no nausea or decreased appetite He has variable compliance with fluid restriction and may not consistently do this   Lab Results  Component Value Date   CREATININE 0.87 04/17/2019   BUN 12 04/17/2019   NA 127 (L) 04/17/2019   K 4.4 04/17/2019   CL 91 (L) 04/17/2019   CO2 30 04/17/2019    Chronic back pain. 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     Assesment/PLAN:  Diabetes type 1, long-standing on basal bolus insulin regimen at that  See history of present illness for detailed discussion of current blood sugar patterns, problems identified and current management  His A1c is 8.6  As before his blood sugars are fluctuating at various times and he still has only 52% of blood sugars within target range Hypoglycemia has been infrequent Again discussed the benefits of Lantus and reducing variability and hypoglycemia as well as long-term better control with once a day injection Also discussed mealtime coverage with various types of meals including higher fat meals such as pizza and fried chicken  We will start 32 units of Tresiba which is what he was taking about a year ago Discussed adjusting this by 2 units every 3 days  if blood sugars are below 190 or over 150 and written instructions given Currently is Evaristo Buryresiba is being dialed by a relative and he will need to inform her about this   2. . Hypertension: Blood pressure is higher today but he is anxious, advised him to check regularly at home and call if persistently high  3.  Idiopathic hyponatremia: Needs follow-up sodium checked today Advised him to be consistent with fluid restriction Because of his smoking history we will proceed with CT scan of his chest  4.  Has been on every other day Crestor and will need follow-up  Counseling time on subjects discussed in assessment and plan sections is over 50% of today's 25 minute visit    Patient Instructions  For pizza take 8-10 Novolog and extra 2-3 umits at 10 pm   For fried food 8 units  Tresiba 32 units daily and go up or down 2 units every 3 days if sugar >150 or < 90 in am    Reather LittlerAjay Zadkiel Dragan 07/16/2019, 2:54 PM

## 2019-08-05 ENCOUNTER — Other Ambulatory Visit: Payer: Self-pay | Admitting: Endocrinology

## 2019-09-25 ENCOUNTER — Other Ambulatory Visit: Payer: Self-pay | Admitting: Endocrinology

## 2019-10-08 ENCOUNTER — Other Ambulatory Visit: Payer: Self-pay | Admitting: *Deleted

## 2019-10-08 DIAGNOSIS — F17210 Nicotine dependence, cigarettes, uncomplicated: Secondary | ICD-10-CM

## 2019-10-08 DIAGNOSIS — Z87891 Personal history of nicotine dependence: Secondary | ICD-10-CM

## 2019-10-11 ENCOUNTER — Telehealth: Payer: Self-pay | Admitting: *Deleted

## 2019-10-11 NOTE — Telephone Encounter (Signed)
Spoke with pt and changed SDMV on 10/21/19 to a televisit. Pt verbalized understanding. Nothing further needed.

## 2019-10-14 ENCOUNTER — Other Ambulatory Visit: Payer: Self-pay

## 2019-10-15 ENCOUNTER — Ambulatory Visit (INDEPENDENT_AMBULATORY_CARE_PROVIDER_SITE_OTHER): Payer: Medicare Other | Admitting: Endocrinology

## 2019-10-15 ENCOUNTER — Other Ambulatory Visit: Payer: Self-pay

## 2019-10-15 ENCOUNTER — Encounter: Payer: Self-pay | Admitting: Endocrinology

## 2019-10-15 VITALS — BP 140/70 | HR 64 | Ht 71.0 in | Wt 135.8 lb

## 2019-10-15 DIAGNOSIS — E871 Hypo-osmolality and hyponatremia: Secondary | ICD-10-CM

## 2019-10-15 DIAGNOSIS — E1065 Type 1 diabetes mellitus with hyperglycemia: Secondary | ICD-10-CM | POA: Diagnosis not present

## 2019-10-15 LAB — BASIC METABOLIC PANEL
BUN: 10 mg/dL (ref 6–23)
CO2: 31 mEq/L (ref 19–32)
Calcium: 8.9 mg/dL (ref 8.4–10.5)
Chloride: 93 mEq/L — ABNORMAL LOW (ref 96–112)
Creatinine, Ser: 0.83 mg/dL (ref 0.40–1.50)
GFR: 94.45 mL/min (ref >60.00)
Glucose, Bld: 252 mg/dL — ABNORMAL HIGH (ref 70–99)
Potassium: 4.4 mEq/L (ref 3.5–5.1)
Sodium: 127 mEq/L — ABNORMAL LOW (ref 135–145)

## 2019-10-15 LAB — POCT GLYCOSYLATED HEMOGLOBIN (HGB A1C): Hemoglobin A1C: 9.2 % — AB (ref 4.0–5.6)

## 2019-10-15 MED ORDER — LANTUS SOLOSTAR 100 UNIT/ML ~~LOC~~ SOPN: PEN_INJECTOR | SUBCUTANEOUS | 2 refills | Status: DC

## 2019-10-15 NOTE — Patient Instructions (Addendum)
Lantus 30 in am on waking up and 32 if needed 12 at bedtime  2 Novolog for snacks

## 2019-10-15 NOTE — Progress Notes (Signed)
Patient ID: Peter Mendez., male   DOB: August 08, 1961, 59 y.o.   MRN: [id number]   Reason for Appointment: Endocrinology follow-up   History of Present Illness   Diagnosis: Type 1 diabetes mellitus, diagnosis 1991.  He has been on basal bolus insulin regimen for a few years His A1c previously was consistently around 7-7.5 He tends to have significant fluctuation in blood sugars at all times. He has difficulty keeping his postprandial blood sugars consistent with mealtime insulin coverage and estimating the amount of insulin needed for various kinds of foods since he does not count carbohydrates A1c has been higher since 2014  Recent history:   The insulin regimen is  Lantus 28--12 units at 10 AM and 10 PM; Novolog 4-6 acb 6-10 acl & acs usually   A1c is again higher at 9.2   Current diabetes management, problems identified and blood sugar patterns  He was told to switch back to [place] but he again was afraid of low sugars and did not do so  As discussed in his interpretation below his blood sugars are averaging about 180 most of the time with sporadic highs and lows at different times  Likely needs higher basal rate during the daytime as [place]reme[place] blood sugars are mostly high  Time within range is still inadequate and around 55  Does not usually have postprandial hyperglycemia  However at night he will eat about 4 peanut butter crackers causing her sugars to be higher without any insulin coverage  Not able to do much exercise  [name] trying to have a protein with breakfast consistently  As discussed below hypoglycemia has been infrequent   CONTINUOUS GLUCOSE MONITORING RECORD INTERPRETATION    Dates of Recording: 2/24 through 3/9  Sensor description: [name]  Results statistics:   CGM use % of time  98  Average and SD  173, GV 31  Time in range    55    % was 52  % Time Above 180  24  % Time above 250  8  % Time Below target  3    PRE-MEAL Fasting Lunch  Dinner Bedtime Overall  Glucose range:       Mean/median:  180  167  167  173   POST-MEAL PC Breakfast PC Lunch PC Dinner  Glucose range:     Mean/median:  189  194  190    Glycemic patterns summary: His overall average blood sugars are right around the 180 upper limit most of the time except between about 11 AM-5 AM overnight.  He does have some variability in his blood sugars from day-to-day but no consistent pattern of high or low sugars  Hyperglycemic episodes have occurred either after a low normal or low sugar but also occasionally in the early afternoon and around 2-3 AM  Hypoglycemic episodes occurred infrequently with only 1 significant episode around 2 AM and low normal readings late morning a couple of times  Overnight periods: Blood sugars are mildly increased after 11 PM but generally are rising after about 2 AM with fasting readings averaging about 180  Preprandial periods: Blood sugars are averaging about 180 at all meals  Postprandial periods:   After breakfast: Breakfast usually around 6-7 AM and blood sugars are on an average only modestly increasing at times  After lunch: This is around noontime and has only occasional spikes with most readings relatively flat or lower After dinner: This is around 5-6 PM and blood sugars are fairly  well in line with [place] readings without excessive rise or fall     Hypoglycemia awareness: Usually develops symptoms when blood glucose is less than 50, usually sweating.   Meals: Inconsistent schedule, Inconsistent quantity. Breakfast is at 6-7 am Supper 6-7 pm  Variable intake at breakfast, some oatmeal; 1/2 sandwich or peanut butter crackers at bedtime   Dietician visit: Most recent:, 4/09.   Wt Readings from Last 3 Encounters:  10/15/19 135 lb 12.8 oz (61.6 kg)  07/16/19 147 lb 9.6 oz (67 kg)  04/17/19 130 lb 9.6 oz (59.2 kg)    Lab Results  Component Value Date   HGBA1C 9.2 (A) 10/15/2019   HGBA1C 8.6 (A) 07/16/2019    HGBA1C 9.1 (A) 04/17/2019   Lab Results  Component Value Date   MICROALBUR 12.5 (H) 04/17/2019   LDLCALC 66 07/16/2019   CREATININE 0.83 10/15/2019    Other active problems: See review of systems     Allergies as of 10/15/2019      Reactions   Pravastatin Sodium    myalgia      Medication List       Accurate as of October 15, 2019 11:59 PM. If you have any questions, ask your nurse or doctor.        alprazolam 2 MG tablet Commonly known as: XANAX take 1 tablet by mouth twice a day if needed for anxiety What changed: additional instructions   FreeStyle Libre 14 Day Sensor Misc 1 Units by Does not apply route every 14 (fourteen) days.   gabapentin 600 MG tablet Commonly known as: NEURONTIN Take 1 tablet 3 times a day as needed   ibuprofen 200 MG tablet Commonly known as: ADVIL Take 200 mg by mouth every 6 (six) hours as needed.   Insulin Pen Needle 31G X 8 MM Misc Commonly known as: B-D ULTRAFINE III SHORT PEN USE TO INJECT INSULIN 5 TIMES A DAY   Lantus SoloStar 100 UNIT/ML Solostar Pen Generic drug: insulin glargine Inject 30-32 units under the skin in the morning and 12 units in the evening. What changed: additional instructions Changed by: Darrick Grinder, LPN   NovoLOG FlexPen 100 UNIT/ML FlexPen Generic drug: insulin aspart INJECT 6 TO 10 UNITS BEFORE MEALS AS DIRECTED  (PEN EXPIRES 28 DAYS AFTER OPENED)   onetouch ultrasoft lancets Use as instructed to check blood sugars 7 times per day   oxycodone 30 MG immediate release tablet Commonly known as: ROXICODONE 15 mg every 4 (four) hours as needed.   ramipril 5 MG capsule Commonly known as: ALTACE Take 1 capsule (5 mg total) by mouth daily.   rosuvastatin 5 MG tablet Commonly known as: Crestor Take 1 tablet (5 mg total) by mouth daily.   triamcinolone cream 0.1 % Commonly known as: KENALOG Apply topically 2 (two) times daily.       Allergies:  Allergies  Allergen Reactions  . Pravastatin  Sodium     myalgia    Past Medical History:  Diagnosis Date  . Chicken pox   . Claudication (HCC)   . Diabetes 1.5, managed as type 1 (HCC)   . Hypertension   . Low back pain   . Tobacco abuse     Past Surgical History:  Procedure Laterality Date  . CATARACT EXTRACTION Bilateral   . INTRAOCULAR LENS INSERTION Bilateral   . RETINAL DETACHMENT SURGERY Bilateral     Family History  Problem Relation Age of Onset  . Diabetes Mother   . Atrial fibrillation Mother   .  Heart attack Father   . Stroke Father   . Arthritis Father   . Heart disease Father   . Diabetes Maternal Uncle   . Diabetes Maternal Grandmother   . Heart attack Maternal Grandmother   . Heart disease Maternal Grandmother   . Esophageal cancer Maternal Grandfather   . Heart attack Sister        at age 4    Social History:  reports that he has been smoking cigarettes. He has been smoking about 0.50 packs per day. He has never used smokeless tobacco. He reports that he does not drink alcohol. No history on file for drug.  Review of Systems   HYPERTENSION: Blood pressure is controlled although somewhat variable  Is on 5 mg ramipril  Occasionally checking at home: Recent readings 132/70-75  BP Readings from Last 3 Encounters:  10/15/19 140/70  07/16/19 (!) 150/60  04/17/19 128/60     Lipids: On Crestor every other day for cardiovascular risk reduction, he has had good levels with regular administration   Lab Results  Component Value Date   CHOL 122 07/16/2019   HDL 49.70 07/16/2019   LDLCALC 66 07/16/2019   TRIG 29.0 07/16/2019   CHOLHDL 2 07/16/2019    HYPONATREMIA: Long-standing and asymptomatic.  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   Demeclocycline was prescribed previously but  not covered by insurance.  Cortrosyn stimulation test was also normal    Urinary sodium previously was 138 and U Osm 245  [name] consistently asymptomatic with no nausea or decreased appetite He has variable compliance with fluid restriction His fluid intake is including some V8 juices and some Gatorade also   Lab Results  Component Value Date   CREATININE 0.83 10/15/2019   BUN 10 10/15/2019   NA 127 (L) 10/15/2019   K 4.4 10/15/2019   CL 93 (L) 10/15/2019   CO2 31 10/15/2019    Chronic back pain. 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     Assesment/PLAN:  Diabetes type 1, long-standing on basal bolus insulin regimen   See history of present illness for detailed discussion of current blood sugar patterns, problems identified and current management  His A1c is back up to over 9%, was 8.6  His A1c is higher than expected for his recent average blood sugar 173 Although he is having fairly stable blood sugars most of the time they are usually relatively high except early part of the night As before has some variability with mealtime coverage but likely needs more basal in the daytime Previously [place] had not been able to keep his blood sugars consistently controlled during the day also He does not want to try this again because of fear of hypoglycemia overnight  He will go up to at least 30 units of the Lantus in the morning but take it  in the early morning on waking up, if blood sugars are still high at dinnertime go up to 32 units Evening dose will be still at bedtime He will need to take 2 units of NovoLog for any snacks  during the night and additional for higher sugars during the day    2. . Hypertension: Blood pressure is relatively good today He may have some whitecoat syndrome  Continue to check at home  3.  Idiopathic hyponatremia: Needs follow-up sodium checked today He has been scheduled to do a CT scan of the chest Again discussed needing to cut down on fluids of all kinds  4.  Has been on every other day Crestor and lipids are well controlled  Total visit time for evaluation and management of multiple problems and counseling = 30 minutes    Patient Instructions  Lantus 30 in am on waking up and 32 if needed 12 at bedtime  2 Novolog for snacks      Reather Littler 10/16/2019, 8:59 AM

## 2019-10-21 ENCOUNTER — Encounter: Payer: Self-pay | Admitting: Acute Care

## 2019-10-21 ENCOUNTER — Ambulatory Visit
Admission: RE | Admit: 2019-10-21 | Discharge: 2019-10-21 | Disposition: A | Discharge status: Home / Self Care | Payer: Medicare Other | Source: Ambulatory Visit | Attending: Acute Care | Admitting: Acute Care

## 2019-10-21 ENCOUNTER — Ambulatory Visit (INDEPENDENT_AMBULATORY_CARE_PROVIDER_SITE_OTHER): Payer: Medicare Other | Admitting: Acute Care

## 2019-10-21 ENCOUNTER — Other Ambulatory Visit: Payer: Self-pay

## 2019-10-21 DIAGNOSIS — Z87891 Personal history of nicotine dependence: Secondary | ICD-10-CM

## 2019-10-21 DIAGNOSIS — Z716 Tobacco abuse counseling: Secondary | ICD-10-CM

## 2019-10-21 DIAGNOSIS — F17210 Nicotine dependence, cigarettes, uncomplicated: Secondary | ICD-10-CM

## 2019-10-21 NOTE — Progress Notes (Signed)
Shared Decision Making Visit Lung Cancer Screening Program [phone])   Eligibility:  Age 59 y.o.  Pack Years Smoking History Calculation 30 pack year smoking history (# packs/per year x # years smoked)  Recent History of coughing up blood  no  Unexplained weight loss? no ( >Than 15 pounds within the last 6 months )  Prior History Lung / other cancer no (Diagnosis within the last 5 years already requiring surveillance chest CT Scans).  Smoking Status Current Smoker  Former Smokers: Years since quit: NA  Quit Date: NA  Visit Components:  Discussion included one or more decision making aids. yes  Discussion included risk/benefits of screening. yes  Discussion included potential follow up diagnostic testing for abnormal scans. yes  Discussion included meaning and risk of over diagnosis. yes  Discussion included meaning and risk of False Positives. yes  Discussion included meaning of total radiation exposure. yes  Counseling Included:  Importance of adherence to annual lung cancer LDCT screening. yes  Impact of comorbidities on ability to participate in the program. yes  Ability and willingness to under diagnostic treatment. yes  Smoking Cessation Counseling:  Current Smokers:   Discussed importance of smoking cessation. yes  Information about tobacco cessation classes and interventions provided to patient. yes  Patient provided with "ticket" for LDCT Scan. yes  Symptomatic Patient. no  Counseling  Diagnosis Code: Tobacco Use Z72.0  Asymptomatic Patient yes  Counseling (Intermediate counseling: > three minutes counseling) F7510  Former Smokers:   Discussed the importance of maintaining cigarette abstinence. yes  Diagnosis Code: Personal History of Nicotine Dependence. C58.527  Information about tobacco cessation classes and interventions provided to patient. Yes  Patient provided with "ticket" for LDCT Scan. yes  Written Order for Lung Cancer  Screening with LDCT placed in Epic. Yes (CT Chest Lung Cancer Screening Low Dose W/O CM) [id number] Z12.2-Screening of respiratory organs Z87.891-Personal history of nicotine dependence  I have spent 25 minutes of face to face time with Peter Mendez  discussing the risks and benefits of lung cancer screening. We viewed a power point together that explained in detail the above noted topics. We paused at intervals to allow for questions to be asked and answered to ensure understanding.We discussed that the single most powerful action that he can take to decrease his risk of developing lung cancer is to quit smoking. We discussed whether or not he is ready to commit to setting a quit date. We discussed options for tools to aid in quitting smoking including nicotine replacement therapy, non-nicotine medications, support groups, Quit Smart classes, and behavior modification. We discussed that often times setting smaller, more achievable goals, such as eliminating 1 cigarette a day for a week and then 2 cigarettes a day for a week can be helpful in slowly decreasing the number of cigarettes smoked. This allows for a sense of accomplishment as well as providing a clinical benefit. I gave him the " Be Stronger Than Your Excuses" card with contact information for community resources, classes, free nicotine replacement therapy, and access to mobile apps, text messaging, and on-line smoking cessation help. I have also given him my card and contact information in the event he needs to contact me. We discussed the time and location of the scan, and that either Abigail Miyamoto RN or I will call with the results within 24-48 hours of receiving them. I have offered him  a copy of the power point we viewed  as a resource in the event they need reinforcement  of the concepts we discussed today in the office. The patient verbalized understanding of all of  the above and had no further questions upon leaving the office. They have my contact  information in the event they have any further questions.  I spent 3 minutes counseling on smoking cessation and the health risks of continued tobacco abuse.  I explained to the patient that there has been a high incidence of coronary artery disease noted on these exams. I explained that this is a non-gated exam therefore degree or severity cannot be determined. This patient is currently on statin therapy. I have asked the patient to follow-up with their PCP regarding any incidental finding of coronary artery disease and management with diet or medication as their PCP  feels is clinically indicated. The patient verbalized understanding of the above and had no further questions upon completion of the visit.   Pt is not currently able to make an appointment with pharmacy for smoking cessation counseling. He will call the office when he can afford another appointment.   Magdalen Spatz, NP 10/21/2019 2:22 PM

## 2019-10-21 NOTE — Patient Instructions (Signed)
Thank you for participating in the [facility]. It was our pleasure to meet you today. We will call you with the results of your scan within the next few days. Your scan will be assigned a Lung RADS category score by the physicians reading the scans.  This Lung RADS score determines follow up scanning.  See below for description of categories, and follow up screening recommendations. We will be in touch to schedule your follow up screening annually or based on recommendations of our providers. We will fax a copy of your scan results to your Primary Care Physician, or the physician who referred you to the program, to ensure they have the results. Please call the office if you have any questions or concerns regarding your scanning experience or results.  Our office number is [phone]. Please speak with Denise Phelps, RN. She is our Lung Cancer Screening RN. If she is unavailable when you call, please have the office staff send her a message. She will return your call at her earliest convenience. Remember, if your scan is normal, we will scan you annually as long as you continue to meet the criteria for the program. (Age 55-77, Current smoker or smoker who has quit within the last 15 years). If you are a smoker, remember, quitting is the single most powerful action that you can take to decrease your risk of lung cancer and other pulmonary, breathing related problems. We know quitting is hard, and we are here to help.  Please let us know if there is anything we can do to help you meet your goal of quitting. If you are a former smoker, congratulations. We are proud of you! Remain smoke free! Remember you can refer friends or family members through the number above.  We will screen them to make sure they meet criteria for the program. Thank you for helping us take better care of you by participating in Lung Screening.  Lung RADS Categories:  Lung RADS 1: no nodules  or definitely non-concerning nodules.  Recommendation is for a repeat annual scan in 12 months.  Lung RADS 2:  nodules that are non-concerning in appearance and behavior with a very low likelihood of becoming an active cancer. Recommendation is for a repeat annual scan in 12 months.  Lung RADS 3: nodules that are probably non-concerning , includes nodules with a low likelihood of becoming an active cancer.  Recommendation is for a 6-month repeat screening scan. Often noted after an upper respiratory illness. We will be in touch to make sure you have no questions, and to schedule your 6-month scan.  Lung RADS 4 A: nodules with concerning findings, recommendation is most often for a follow up scan in 3 months or additional testing based on our provider's assessment of the scan. We will be in touch to make sure you have no questions and to schedule the recommended 3 month follow up scan.  Lung RADS 4 B:  indicates findings that are concerning. We will be in touch with you to schedule additional diagnostic testing based on our provider's  assessment of the scan.   

## 2019-10-24 ENCOUNTER — Other Ambulatory Visit: Payer: Self-pay | Admitting: *Deleted

## 2019-10-24 DIAGNOSIS — Z87891 Personal history of nicotine dependence: Secondary | ICD-10-CM

## 2019-10-24 DIAGNOSIS — F17210 Nicotine dependence, cigarettes, uncomplicated: Secondary | ICD-10-CM

## 2019-10-24 NOTE — Progress Notes (Signed)
I have called Mr. Henard and let them  know their  low dose Ct was read as a Lung  RADS 3, nodules that are probably benign findings, short term follow up suggested: includes nodules with a low likelihood of becoming a clinically active cancer. Radiology recommends a 6 month repeat LDCT follow up. .I explained that we will order and schedule their  6 month follow up screening scan for 04/2021. Ilet them  know there was notation of CAD on their  scan.  Please remind the patient  that this is a non-gated exam therefore degree or severity of disease  cannot be determined. Please have them  follow up with their PCP regarding potential risk factor modification, dietary therapy or pharmacologic therapy if clinically indicated. Pt.  is  currently on statin therapy. Angelique Blonder, Please place order for annual  screening scan for  04/2021 and fax results to PCP. Let them know we have a 6 month follow up scan ordered. Thanks so much.

## 2020-01-21 ENCOUNTER — Other Ambulatory Visit: Payer: Self-pay

## 2020-01-21 ENCOUNTER — Encounter: Payer: Self-pay | Admitting: Endocrinology

## 2020-01-21 ENCOUNTER — Ambulatory Visit (INDEPENDENT_AMBULATORY_CARE_PROVIDER_SITE_OTHER): Payer: Medicare Other | Admitting: Endocrinology

## 2020-01-21 VITALS — BP 130/50 | HR 62 | Ht 71.0 in | Wt 133.6 lb

## 2020-01-21 DIAGNOSIS — I10 Essential (primary) hypertension: Secondary | ICD-10-CM | POA: Diagnosis not present

## 2020-01-21 DIAGNOSIS — E782 Mixed hyperlipidemia: Secondary | ICD-10-CM | POA: Diagnosis not present

## 2020-01-21 DIAGNOSIS — E871 Hypo-osmolality and hyponatremia: Secondary | ICD-10-CM

## 2020-01-21 DIAGNOSIS — E1065 Type 1 diabetes mellitus with hyperglycemia: Secondary | ICD-10-CM

## 2020-01-21 LAB — COMPREHENSIVE METABOLIC PANEL
ALT: 9 U/L (ref 0–53)
AST: 15 U/L (ref 0–37)
Albumin: 4.1 g/dL (ref 3.5–5.2)
Alkaline Phosphatase: 69 U/L (ref 39–117)
BUN: 12 mg/dL (ref 6–23)
CO2: 30 mEq/L (ref 19–32)
Calcium: 9.1 mg/dL (ref 8.4–10.5)
Chloride: 90 mEq/L — ABNORMAL LOW (ref 96–112)
Creatinine, Ser: 0.91 mg/dL (ref 0.40–1.50)
GFR: 84.86 mL/min (ref >60.00)
Glucose, Bld: 365 mg/dL — ABNORMAL HIGH (ref 70–99)
Potassium: 4.4 mEq/L (ref 3.5–5.1)
Sodium: 125 mEq/L — ABNORMAL LOW (ref 135–145)
Total Bilirubin: 0.8 mg/dL (ref 0.2–1.2)
Total Protein: 6.8 g/dL (ref 6.0–8.3)

## 2020-01-21 LAB — POCT GLYCOSYLATED HEMOGLOBIN (HGB A1C): Hemoglobin A1C: 8.7 % — AB (ref 4.0–5.6)

## 2020-01-21 LAB — URINALYSIS, ROUTINE W REFLEX MICROSCOPIC
Bilirubin Urine: NEGATIVE
Hgb urine dipstick: NEGATIVE
Ketones, ur: NEGATIVE
Leukocytes,Ua: NEGATIVE
Nitrite: NEGATIVE
Specific Gravity, Urine: 1.01 (ref 1.000–1.030)
Total Protein, Urine: NEGATIVE
Urine Glucose: >=1000 — AB
Urobilinogen, UA: 0.2 (ref 0.0–1.0)
pH: 6.5 (ref 5.0–8.0)

## 2020-01-21 LAB — LIPID PANEL
Cholesterol: 152 mg/dL (ref 0–200)
HDL: 47.7 mg/dL (ref >39.00)
LDL Cholesterol: 91 mg/dL (ref 0–99)
NonHDL: 104.01
Total CHOL/HDL Ratio: 3
Triglycerides: 66 mg/dL (ref 0.0–149.0)
VLDL: 13.2 mg/dL (ref 0.0–40.0)

## 2020-01-21 LAB — MICROALBUMIN / CREATININE URINE RATIO
Creatinine,U: 90 mg/dL
Microalb Creat Ratio: 8.5 mg/g (ref 0.0–30.0)
Microalb, Ur: 7.6 mg/dL — ABNORMAL HIGH (ref 0.0–1.9)

## 2020-01-21 LAB — TSH: TSH: 0.67 u[IU]/mL (ref 0.35–4.50)

## 2020-01-21 LAB — T4, FREE: Free T4: 1.07 ng/dL (ref 0.60–1.60)

## 2020-01-21 NOTE — Progress Notes (Signed)
Patient ID: Peter Mendez., male   DOB: [birth year], 58 y.o.   MRN: [id number]   Reason for Appointment: Endocrinology follow-up   History of Present Illness   Diagnosis: Type 1 diabetes mellitus, diagnosis 1991.  He has been on basal bolus insulin regimen for a few years His A1c previously was consistently around 7-7.5 He tends to have significant fluctuation in blood sugars at all times. He has difficulty keeping his postprandial blood sugars consistent with mealtime insulin coverage and estimating the amount of insulin needed for various kinds of foods since he does not count carbohydrates A1c has been higher since 2014  Recent history:   The insulin regimen is  Lantus 32--12 units at 10 AM and 10 PM; Novolog 4-6 acb 4-8 acl & acs    A1c is only slightly better at 8.7 Previously higher at 9.2   Current diabetes management, problems identified and blood sugar patterns  He was told to increase his Lantus at least 2 units and has gone up from 28 up to 32 units in the morning  However as explained below his blood sugar patterns are still indicating persistently high readings throughout the day  Previously had been reluctant to take [place] for fear of low sugars  In the last few days he has had more variability in his blood sugars including one episode of low sugars seen on his sensor most of the night  Postprandial readings have been variable but likely not getting enough insulin especially at breakfast  Also he does not take extra insulin when Premeal blood sugars are high, today Premeal blood sugar was 250 and now is about 280  He does check his sugar several times a day  [name] somewhat more active  Although he seems to be eating regular meals he is still losing a little weight  Takes NovoLog before starting to eat   CONTINUOUS GLUCOSE MONITORING RECORD INTERPRETATION    Dates of Recording: Last 2 weeks  Sensor description: [name]  Results statistics:     CGM use % of time  98  Average and SD  181, GV 30  Time in range      48%  % Time Above 180  43  % Time above 250  8  % Time Below target  1    PRE-MEAL Fasting Lunch Dinner  12-2 AM Overall  Glucose range:       Mean/median:  180  190  173  148  181   POST-MEAL PC Breakfast PC Lunch PC Dinner  Glucose range:     Mean/median:  192  198  211    Glycemic patterns summary: Overall blood sugars are averaging close to 180 most of the time except early part of the night.  Although there is day-to-day variability there is not much postprandial hyperglycemia except sometimes after dinner or breakfast.  Only rarely blood sugars will be relatively better controlled.  Hypoglycemia occurred only on 1 night on 6/8 for several hours  Hyperglycemic episodes as above are occurring at various times, mostly after meals occasionally early morning including a rebound on 6/8, low sugar.  Hypoglycemic episodes occurred only once on 6/8 overnight  Overnight periods: Blood sugars are the lowest between 12-4 AM with only one episode of hypoglycemia but otherwise start rising around 3 AM overall with average about 180-190 early morning  Preprandial periods: Blood sugars are averaging 180 at breakfast and about 190 at lunch, overall 173 at dinnertime  Postprandial  periods:   On an average blood sugars are not rising excessively but blood sugars are quite variable on some days Averages are listed above  Previous data:   CGM use % of time  98  Average and SD  173, GV 31  Time in range    55    % was 52  % Time Above 180  24  % Time above 250  8  % Time Below target  3    PRE-MEAL Fasting Lunch Dinner Bedtime Overall  Glucose range:       Mean/median:  180  167  167  173   POST-MEAL PC Breakfast PC Lunch PC Dinner  Glucose range:     Mean/median:  189  194  190         Hypoglycemia awareness: Usually develops symptoms when blood glucose is less than 50, usually sweating.   Meals:  Inconsistent schedule, Inconsistent quantity. Breakfast is at 6-7 am Supper 6-7 pm  Variable intake at breakfast, some oatmeal; 1/2 sandwich or peanut butter crackers at bedtime   Dietician visit: Most recent:, 4/09.   Wt Readings from Last 3 Encounters:  01/21/20 133 lb 9.6 oz (60.6 kg)  10/15/19 135 lb 12.8 oz (61.6 kg)  07/16/19 147 lb 9.6 oz (67 kg)    Lab Results  Component Value Date   HGBA1C 8.7 (A) 01/21/2020   HGBA1C 9.2 (A) 10/15/2019   HGBA1C 8.6 (A) 07/16/2019   Lab Results  Component Value Date   MICROALBUR 12.5 (H) 04/17/2019   LDLCALC 66 07/16/2019   CREATININE 0.83 10/15/2019    Other active problems: See review of systems     Allergies as of 01/21/2020      Reactions   Pravastatin Sodium    myalgia      Medication List       Accurate as of January 21, 2020  1:51 PM. If you have any questions, ask your nurse or doctor.        alprazolam 2 MG tablet Commonly known as: XANAX take 1 tablet by mouth twice a day if needed for anxiety What changed: additional instructions   FreeStyle Libre 14 Day Sensor Misc 1 Units by Does not apply route every 14 (fourteen) days.   gabapentin 600 MG tablet Commonly known as: NEURONTIN Take 1 tablet 3 times a day as needed   ibuprofen 200 MG tablet Commonly known as: ADVIL Take 200 mg by mouth every 6 (six) hours as needed.   Insulin Pen Needle 31G X 8 MM Misc Commonly known as: B-D ULTRAFINE III SHORT PEN USE TO INJECT INSULIN 5 TIMES A DAY   Lantus SoloStar 100 UNIT/ML Solostar Pen Generic drug: insulin glargine Inject 30-32 units under the skin in the morning and 12 units in the evening.   NovoLOG FlexPen 100 UNIT/ML FlexPen Generic drug: insulin aspart INJECT 6 TO 10 UNITS BEFORE MEALS AS DIRECTED  (PEN EXPIRES 28 DAYS AFTER OPENED)   onetouch ultrasoft lancets Use as instructed to check blood sugars 7 times per day   oxycodone 30 MG immediate release tablet Commonly known as: ROXICODONE 15 mg  every 4 (four) hours as needed.   ramipril 5 MG capsule Commonly known as: ALTACE Take 1 capsule (5 mg total) by mouth daily.   rosuvastatin 5 MG tablet Commonly known as: Crestor Take 1 tablet (5 mg total) by mouth daily.   triamcinolone cream 0.1 % Commonly known as: KENALOG Apply topically 2 (two) times daily.  Allergies:  Allergies  Allergen Reactions  . Pravastatin Sodium     myalgia    Past Medical History:  Diagnosis Date  . Chicken pox   . Claudication (HCC)   . Diabetes 1.5, managed as type 1 (HCC)   . Hypertension   . Low back pain   . Tobacco abuse     Past Surgical History:  Procedure Laterality Date  . CATARACT EXTRACTION Bilateral   . INTRAOCULAR LENS INSERTION Bilateral   . RETINAL DETACHMENT SURGERY Bilateral     Family History  Problem Relation Age of Onset  . Diabetes Mother   . Atrial fibrillation Mother   . Heart attack Father   . Stroke Father   . Arthritis Father   . Heart disease Father   . Diabetes Maternal Uncle   . Diabetes Maternal Grandmother   . Heart attack Maternal Grandmother   . Heart disease Maternal Grandmother   . Esophageal cancer Maternal Grandfather   . Heart attack Sister        at age 87    Social History:  reports that he has been smoking cigarettes. He has a 31.50 pack-year smoking history. He has never used smokeless tobacco. He reports that he does not drink alcohol. No history on file for drug use.  Review of Systems   HYPERTENSION: Blood pressure is controlled although somewhat variable  Is on 5 mg ramipril  We will periodically check at home also  BP Readings from Last 3 Encounters:  01/21/20 (!) 130/50  10/15/19 140/70  07/16/19 (!) 150/60     Lipids: On Crestor every other day for cardiovascular risk reduction, he has had good levels with regular administration   Lab Results  Component Value Date   CHOL 122 07/16/2019   HDL 49.70 07/16/2019   LDLCALC 66 07/16/2019   TRIG 29.0  07/16/2019   CHOLHDL 2 07/16/2019    HYPONATREMIA: Long-standing and asymptomatic.  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   Demeclocycline was prescribed previously but  not covered by insurance.  Cortrosyn stimulation test was also normal   Urinary sodium previously was 138 and U Osm 245  [name] again asymptomatic with no nausea or decreased appetite although this year has lost weight He does not think he is drinking excessive amounts of juices or Gatorade now and only drinks more water when he is outside in the heat   Lab Results  Component Value Date   CREATININE 0.83 10/15/2019   BUN 10 10/15/2019   NA 127 (L) 10/15/2019   K 4.4 10/15/2019   CL 93 (L) 10/15/2019   CO2 31 10/15/2019    Lung cancer screening: He finally had a CT scan done through the pulmonologist and does not have any significant lesions but 30-month exam is recommended  Chronic back pain. 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     Assesment/PLAN:  Diabetes type 1, long-standing on basal bolus insulin regimen   See history of present illness for detailed discussion of current blood sugar patterns, problems identified and current management  His A1c is back up to over 9%, was 8.6  His A1c is higher than expected for his recent average blood sugar 173 Although he is having fairly stable blood sugars most of the time they are usually relatively high  except early part of the night As before has some variability with mealtime coverage but likely needs more basal in the daytime Previously [place] had not been able to keep his blood sugars consistently controlled during the day also He does not want to try this again because of fear of hypoglycemia overnight  He will go up to at least 30 units of the Lantus in the morning but take it in the early morning on waking up, if blood sugars are still high at dinnertime go up to 32 units Evening dose will be still at bedtime He will need to take 2 units of NovoLog for any snacks during the night and additional for higher sugars during the day    2. . Hypertension: Blood pressure is relatively good today He may have some whitecoat syndrome  Continue to check at home  3.  Idiopathic hyponatremia: Needs follow-up sodium checked today He has been scheduled to do a CT scan of the chest Again discussed needing to cut down on fluids of all kinds  4.  Has been on every other day Crestor and lipids are well controlled  Total visit time for evaluation and management of multiple problems and counseling = 30 minutes    There are no Patient Instructions on file for this visit.   Reather Littler 01/21/2020, 1:51 PM

## 2020-01-21 NOTE — Patient Instructions (Signed)
LANTUS 36 IN AM AND 10 AT BEDTIME

## 2020-01-22 LAB — SODIUM, URINE, RANDOM: Sodium, Ur: <20 mmol/L

## 2020-01-22 LAB — OSMOLALITY, URINE: Osmolality, Ur: 509 mOsmol/kg

## 2020-03-03 ENCOUNTER — Other Ambulatory Visit: Payer: Self-pay

## 2020-03-03 ENCOUNTER — Telehealth: Payer: Self-pay | Admitting: Endocrinology

## 2020-03-03 MED ORDER — BD PEN NEEDLE SHORT U/F 31G X 8 MM MISC: 0 refills | Status: DC

## 2020-03-03 MED ORDER — RAMIPRIL 5 MG PO CAPS: 5.0000 mg | ORAL_CAPSULE | Freq: Every day | ORAL | 3 refills | Status: DC

## 2020-03-03 MED ORDER — LANTUS SOLOSTAR 100 UNIT/ML ~~LOC~~ SOPN: PEN_INJECTOR | SUBCUTANEOUS | 0 refills | Status: DC

## 2020-03-03 NOTE — Telephone Encounter (Signed)
Medication Refill Request  Did you call your pharmacy and request this refill first? Yes-No refills on RX . If patient has not contacted pharmacy first, instruct them to do so for future refills.  . Remind them that contacting the pharmacy for their refill is the quickest method to get the refill.  . Refill policy also stated that it will take anywhere between 24-72 hours to receive the refill.    Name of medication?   insulin glargine (LANTUS SOLOSTAR) 100 UNIT/ML Solostar Pen AND  ramipril (ALTACE) 5 MG capsule AND Insulin Pen Needle (B-D ULTRAFINE III SHORT PEN) 31G X 8 MM MISC  Is this a 90 day supply? Yes   Name and location of pharmacy?  [facility] - [place], [place] - [address] Phone:  [phone]  Fax:  [phone]       . Is the request for diabetes test strips? No . If yes, what brand? N/A

## 2020-03-03 NOTE — Telephone Encounter (Signed)
Rx sent 

## 2020-03-11 ENCOUNTER — Other Ambulatory Visit: Payer: Self-pay | Admitting: Endocrinology

## 2020-04-21 ENCOUNTER — Other Ambulatory Visit: Payer: Self-pay | Admitting: Endocrinology

## 2020-04-27 DIAGNOSIS — G8929 Other chronic pain: Secondary | ICD-10-CM | POA: Insufficient documentation

## 2020-04-28 ENCOUNTER — Encounter: Payer: Self-pay | Admitting: Endocrinology

## 2020-04-28 ENCOUNTER — Other Ambulatory Visit: Payer: Self-pay

## 2020-04-28 ENCOUNTER — Ambulatory Visit (INDEPENDENT_AMBULATORY_CARE_PROVIDER_SITE_OTHER): Payer: Medicare Other | Admitting: Endocrinology

## 2020-04-28 VITALS — BP 120/62 | HR 57 | Wt 131.0 lb

## 2020-04-28 DIAGNOSIS — E1065 Type 1 diabetes mellitus with hyperglycemia: Secondary | ICD-10-CM | POA: Diagnosis not present

## 2020-04-28 LAB — COMPREHENSIVE METABOLIC PANEL
ALT: 11 U/L (ref 0–53)
AST: 14 U/L (ref 0–37)
Albumin: 4 g/dL (ref 3.5–5.2)
Alkaline Phosphatase: 70 U/L (ref 39–117)
BUN: 14 mg/dL (ref 6–23)
CO2: 32 mEq/L (ref 19–32)
Calcium: 9.1 mg/dL (ref 8.4–10.5)
Chloride: 88 mEq/L — ABNORMAL LOW (ref 96–112)
Creatinine, Ser: 0.97 mg/dL (ref 0.40–1.50)
GFR: 78.76 mL/min (ref >60.00)
Glucose, Bld: 292 mg/dL — ABNORMAL HIGH (ref 70–99)
Potassium: 4.6 mEq/L (ref 3.5–5.1)
Sodium: 125 mEq/L — ABNORMAL LOW (ref 135–145)
Total Bilirubin: 1 mg/dL (ref 0.2–1.2)
Total Protein: 6.7 g/dL (ref 6.0–8.3)

## 2020-04-28 LAB — POCT GLYCOSYLATED HEMOGLOBIN (HGB A1C): Hemoglobin A1C: 9.3 % — AB (ref 4.0–5.6)

## 2020-04-28 MED ORDER — FREESTYLE LIBRE 14 DAY SENSOR MISC: 1.0000 [IU] | 4 refills | Status: DC

## 2020-04-28 NOTE — Patient Instructions (Addendum)
LANTUS 36 UNITS IN AM AND 10 UNITS AT NITE  EXTRA NOVOLOG for high sugar over 150:  150-170 take 1 unit   170-230 take 2 units 230-300 take 3 units  Over 300 extra 4 units  Phone: [phone] for [facility] [employer]

## 2020-04-28 NOTE — Progress Notes (Signed)
Patient ID: Peter Newness., male   DOB: 04/29/1961, 59 y.o.   MRN: [id number]   Reason for Appointment: Endocrinology follow-up   History of Present Illness   Diagnosis: Type 1 diabetes mellitus, diagnosis 1991.  Peter Mendez has been on basal bolus insulin regimen for a few years His A1c previously was consistently around 7-7.5 Peter Mendez tends to have significant fluctuation in blood sugars at all times. Peter Mendez has difficulty keeping his postprandial blood sugars consistent with mealtime insulin coverage and estimating the amount of insulin needed for various kinds of foods since Peter Mendez does not count carbohydrates A1c has been higher since 2014  Recent history:   The insulin regimen is  Lantus 32--12 units at 10 AM and 10 PM; Novolog 4-6 acb 4-8 acl & acs    A1c is back up to 9.3  Current diabetes management, problems identified and blood sugar patterns  Peter Mendez has been using 32 units Lantus in the morning  However blood sugars have been fluctuating in the last week they have been mostly higher especially during the daytime  Also blood sugars have been running around 300 for the last day and a half  Peter Mendez does not understand the need to take extra NovoLog when his blood sugars are high  Does not think Peter Mendez has missed any doses of Lantus  HYPOGLYCEMIA has occurred only 1 or 2 times with significant drop overnight; Peter Mendez does not think Peter Mendez takes extra NovoLog during the night for high sugars  Again mostly taking 4 units of NovoLog at meals and is not adjusting it much based on what Peter Mendez is eating   CONTINUOUS GLUCOSE MONITORING RECORD INTERPRETATION    Dates of Recording: Last 2 weeks  Sensor description: Freestyle libre  Results statistics:   CGM use % of time 98  Average and SD  194+/-31  Time in range  40      %  % Time Above 180 43  % Time above 250 15  % Time Below target 2    PRE-MEAL Fasting Lunch Dinner Bedtime Overall  Glucose range:       Mean/median:  163  201  211   194    POST-MEAL PC Breakfast PC Lunch PC Dinner  Glucose range:     Mean/median:  182  215  214    Glycemic patterns summary: Blood sugars are averaging around 180 or more throughout the 24-hour time span and trending higher between about 10 AM-10 PM.  Also has modest variability.  Only rarely will have relatively low readings around 6-7 AM  Hyperglycemic episodes are occurring regularly especially in the last week.  Blood sugars are mostly running consistently high with only occasional blood sugar spikes postprandially.  Also will rebound significantly from low sugars  Hypoglycemic episodes occurred only twice, once around 10 AM and 1 around 7 AM  Overnight periods: Blood sugars are mostly averaging about 160-184 lowest around 6 AM with 1 episode of hypoglycemia and numbers are significant hypoglycemic episode  Preprandial periods: Blood sugars are relatively better on 160 in the morning, mostly higher at lunch and dinner with some variability  Postprandial periods:   No consistent pattern seen, blood sugars are usually rising to a limited degree without excessive spiking   Previous data:   CGM use % of time  98  Average and SD  181, GV 30  Time in range      48%  % Time Above 180  43  % Time  above 250  8  % Time Below target  1    PRE-MEAL Fasting Lunch Dinner  12-2 AM Overall  Glucose range:       Mean/median:  180  190  173  148  181   POST-MEAL PC Breakfast PC Lunch PC Dinner  Glucose range:     Mean/median:  192  198  211    Glycemic patterns summary: Overall blood sugars are averaging close to 180 most of the time except early part of the night.  Although there is day-to-day variability there is not much postprandial hyperglycemia except sometimes after dinner or breakfast.  Only rarely blood sugars will be relatively better controlled.  Hypoglycemia occurred only on 1 night on 6/8 for several hours  Hypoglycemia awareness: Usually develops symptoms when blood glucose is  less than 50, usually sweating.   Meals: Inconsistent schedule, Inconsistent quantity. Breakfast is at 6-7 am Supper 6-7 pm  Variable intake at breakfast, some oatmeal; 1/2 sandwich or peanut butter crackers at bedtime   Dietician visit: Most recent:, 4/09.   Wt Readings from Last 3 Encounters:  04/28/20 131 lb (59.4 kg)  01/21/20 133 lb 9.6 oz (60.6 kg)  10/15/19 135 lb 12.8 oz (61.6 kg)    Lab Results  Component Value Date   HGBA1C 9.3 (A) 04/28/2020   HGBA1C 8.7 (A) 01/21/2020   HGBA1C 9.2 (A) 10/15/2019   Lab Results  Component Value Date   MICROALBUR 7.6 (H) 01/21/2020   LDLCALC 91 01/21/2020   CREATININE 0.97 04/28/2020    Other active problems: See review of systems     Allergies as of 04/28/2020      Reactions   Pravastatin Sodium    myalgia      Medication List       Accurate as of April 28, 2020  8:36 PM. If you have any questions, ask your nurse or doctor.        alprazolam 2 MG tablet Commonly known as: XANAX take 1 tablet by mouth twice a day if needed for anxiety What changed: additional instructions   B-D ULTRAFINE III SHORT PEN 31G X 8 MM Misc Generic drug: Insulin Pen Needle USE TO INJECT INSULIN 5 TIMES A DAY   FreeStyle Libre 14 Day Sensor Misc 1 Units by Does not apply route every 14 (fourteen) days.   gabapentin 600 MG tablet Commonly known as: NEURONTIN Take 1 tablet 3 times a day as needed   ibuprofen 200 MG tablet Commonly known as: ADVIL Take 200 mg by mouth every 6 (six) hours as needed.   Lantus SoloStar 100 UNIT/ML Solostar Pen Generic drug: insulin glargine INJECT 30 UNITS SUBCUTANEOUSLY IN THE MORNING AND 32 UNITS IN THE EVENING.   NovoLOG FlexPen 100 UNIT/ML FlexPen Generic drug: insulin aspart INJECT 6 TO 10 UNITS BEFORE MEALS AS DIRECTED  (PEN EXPIRES 28 DAYS AFTER OPENED)   onetouch ultrasoft lancets Use as instructed to check blood sugars 7 times per day   oxycodone 30 MG immediate release  tablet Commonly known as: ROXICODONE 15 mg every 4 (four) hours as needed.   ramipril 5 MG capsule Commonly known as: ALTACE Take 1 capsule (5 mg total) by mouth daily.   rosuvastatin 5 MG tablet Commonly known as: Crestor Take 1 tablet (5 mg total) by mouth daily.   triamcinolone cream 0.1 % Commonly known as: KENALOG Apply topically 2 (two) times daily.       Allergies:  Allergies  Allergen Reactions  . Pravastatin  Sodium     myalgia    Past Medical History:  Diagnosis Date  . Chicken pox   . Claudication (HCC)   . Diabetes 1.5, managed as type 1 (HCC)   . Hypertension   . Low back pain   . Tobacco abuse     Past Surgical History:  Procedure Laterality Date  . CATARACT EXTRACTION Bilateral   . INTRAOCULAR LENS INSERTION Bilateral   . RETINAL DETACHMENT SURGERY Bilateral     Family History  Problem Relation Age of Onset  . Diabetes Mother   . Atrial fibrillation Mother   . Heart attack Father   . Stroke Father   . Arthritis Father   . Heart disease Father   . Diabetes Maternal Uncle   . Diabetes Maternal Grandmother   . Heart attack Maternal Grandmother   . Heart disease Maternal Grandmother   . Esophageal cancer Maternal Grandfather   . Heart attack Sister        at age 20    Social History:  reports that Peter Mendez has been smoking cigarettes. Peter Mendez has a 31.50 pack-year smoking history. Peter Mendez has never used smokeless tobacco. Peter Mendez reports that Peter Mendez does not drink alcohol. No history on file for drug use.  Review of Systems   HYPERTENSION: Blood pressure is controlled although somewhat variable  Is on 5 mg ramipril  Also has blood pressure meter at home  BP Readings from Last 3 Encounters:  04/28/20 120/62  01/21/20 (!) 130/50  10/15/19 140/70     Lipids: On Crestor every other day for cardiovascular risk reduction with the following levels   Lab Results  Component Value Date   CHOL 152 01/21/2020   HDL 47.70 01/21/2020   LDLCALC 91 01/21/2020    TRIG 66.0 01/21/2020   CHOLHDL 3 01/21/2020    HYPONATREMIA: Long-standing and asymptomatic.  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   Demeclocycline was prescribed previously but  not covered by insurance.  Cortrosyn stimulation test was also normal   Urinary sodium in 6/21 was <20 and U Osm 509  Peter Mendez is asymptomatic with no nausea or decreased appetite even when his sodium was 125 Peter Mendez does not think Peter Mendez is drinking excessive amounts of water or Gatorade   Lab Results  Component Value Date   CREATININE 0.97 04/28/2020   BUN 14 04/28/2020   NA 125 (L) 04/28/2020   K 4.6 04/28/2020   CL 88 (L) 04/28/2020   CO2 32 04/28/2020    Lung cancer screening: Peter Mendez had a CT scan done through the pulmonologist and does not have any significant lesions but 72-month exam is PENDING  Chronic back pain. 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   Diabetic Foot Exam - Simple   Simple Foot Form Diabetic Foot exam was performed with the following findings: Yes   Visual Inspection No deformities, no ulcerations, no other skin breakdown bilaterally: Yes See comments: Yes Sensation Testing See comments: Yes  Pulse Check See comments: Yes Comments Bilaterally absent monofilament sensation in the feet Absent pedal pulses bilaterally Large mycotic toenail on the left great toe and also some onychomycosis on other toenails       Assesment/PLAN:  Diabetes type 1, long-standing on basal bolus insulin regimen   See history of present illness for  detailed discussion of current blood sugar patterns, problems identified and current management  His A1c is back up to over 9%, was 8.6  Appears to be requiring increasing doses of basal insulin without adequate control Currently only 40% of blood sugars are within target in the last 2 weeks compared to 48 Peter Mendez is also not taking additional insulin when blood sugars are higher for correction doses  Taking larger doses of Lantus in the morning since most of his hyperglycemia is between 10 AM and 10 PM as discussed above Although Peter Mendez had done better with [place] Peter Mendez again does not want to retry it because of fear of hypoglycemia overnight  Peter Mendez will go up to at least 36 units of the Lantus in the morning, needs to take it on waking up Reduce evening dose to 10 units to prevent potential low sugars overnight We will call if Peter Mendez is still having high reading We will consider TOUJEO on the next visit for more consistent control and recently less hypoglycemia Since Peter Mendez has just his Lantus-we will wait till his next prescription is due  Given him a sliding scale for high blood sugar correction Also need to adjust his NovoLog up to 8 units for larger carbohydrate meals    2. . Hypertension: Blood pressure is controlled Peter Mendez also will monitor at home and let us know if it is unusually high or low  3.  Idiopathic hyponatremia: Needs follow-up sodium checked today May be related to long-term use of narcotic pain medications SIADH although his last sodium was low  4.  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

## 2020-04-29 ENCOUNTER — Ambulatory Visit
Admission: RE | Admit: 2020-04-29 | Discharge: 2020-04-29 | Disposition: A | Discharge status: Home / Self Care | Payer: Medicare Other | Source: Ambulatory Visit | Attending: Acute Care | Admitting: Acute Care

## 2020-04-29 ENCOUNTER — Telehealth: Payer: Self-pay

## 2020-04-29 DIAGNOSIS — F17210 Nicotine dependence, cigarettes, uncomplicated: Secondary | ICD-10-CM

## 2020-04-29 DIAGNOSIS — Z87891 Personal history of nicotine dependence: Secondary | ICD-10-CM

## 2020-04-29 NOTE — Telephone Encounter (Signed)
Informed patient of results and recommendations.  He will call back to schedule due to multiple upcoming appointments.

## 2020-04-29 NOTE — Telephone Encounter (Signed)
-----   Message from Darrick Grinder, LPN sent at [id number]  7:48 AM EDT -----  ----- Message ----- From: Reather Littler, MD Sent: 04/28/2020   8:39 PM EDT To: Shelly Bombard, CMA, Darrick Grinder, LPN  Please let him know that sodium test is low at 125.  He will need to pick up salt tablets from the pharmacy and take one with each meal, not available by prescription.  Also may increase salty foods like canned soups.  Needs to be seen back in another 3 weeks for follow-up, same day labs

## 2020-04-30 ENCOUNTER — Telehealth: Payer: Self-pay | Admitting: Acute Care

## 2020-04-30 DIAGNOSIS — Z87891 Personal history of nicotine dependence: Secondary | ICD-10-CM

## 2020-04-30 DIAGNOSIS — F17210 Nicotine dependence, cigarettes, uncomplicated: Secondary | ICD-10-CM

## 2020-04-30 NOTE — Telephone Encounter (Signed)
Please call patient and let them  know their  low dose Ct was read as a* Lung RADS 2: nodules that are benign in appearance and behavior with a very low likelihood of becoming a clinically active cancer due to size or lack of growth. Recommendation per radiology is for a repeat LDCT in 12 months. Please let them  know we will order and schedule their  annual screening scan for 04/2021. Please let them  know there was notation of CAD on their  scan.  Please remind the patient  that this is a non-gated exam therefore degree or severity of disease  cannot be determined. Please have them  follow up with their PCP regarding potential risk factor modification, dietary therapy or pharmacologic therapy if clinically indicated. Pt.  is  currently on statin therapy. Please place order for annual  screening scan for  04/2021 and fax results to PCP. Thanks so much.  Angelique Blonder, let her know there has been resolution of the borderline to mild mediastinal lymphadenopathy seen previously. Thanks so much

## 2020-05-01 NOTE — Telephone Encounter (Signed)
Pt informed of CT results per Sarah Groce, NP.  PT verbalized understanding.  Copy sent to PCP.  Order placed for 1 yr f/u CT.  

## 2020-06-03 ENCOUNTER — Other Ambulatory Visit: Payer: Self-pay | Admitting: Endocrinology

## 2020-06-25 ENCOUNTER — Ambulatory Visit
Admission: RE | Admit: 2020-06-25 | Discharge: 2020-06-25 | Disposition: A | Discharge status: Home / Self Care | Payer: Medicare Other | Source: Ambulatory Visit | Attending: Family Medicine | Admitting: Family Medicine

## 2020-06-25 ENCOUNTER — Other Ambulatory Visit: Payer: Self-pay | Admitting: Family Medicine

## 2020-06-25 DIAGNOSIS — W19XXXA Unspecified fall, initial encounter: Secondary | ICD-10-CM

## 2020-06-25 DIAGNOSIS — M545 Low back pain, unspecified: Secondary | ICD-10-CM

## 2020-07-21 ENCOUNTER — Encounter: Payer: Self-pay | Admitting: Endocrinology

## 2020-07-21 ENCOUNTER — Ambulatory Visit (INDEPENDENT_AMBULATORY_CARE_PROVIDER_SITE_OTHER): Payer: Medicare Other | Admitting: Endocrinology

## 2020-07-21 ENCOUNTER — Other Ambulatory Visit: Payer: Medicare Other

## 2020-07-21 VITALS — BP 146/68 | HR 66 | Ht 71.0 in | Wt 132.0 lb

## 2020-07-21 DIAGNOSIS — I10 Essential (primary) hypertension: Secondary | ICD-10-CM | POA: Diagnosis not present

## 2020-07-21 DIAGNOSIS — E1065 Type 1 diabetes mellitus with hyperglycemia: Secondary | ICD-10-CM | POA: Diagnosis not present

## 2020-07-21 DIAGNOSIS — E871 Hypo-osmolality and hyponatremia: Secondary | ICD-10-CM | POA: Diagnosis not present

## 2020-07-21 LAB — COMPREHENSIVE METABOLIC PANEL
ALT: 8 U/L (ref 0–53)
AST: 17 U/L (ref 0–37)
Albumin: 3.6 g/dL (ref 3.5–5.2)
Alkaline Phosphatase: 82 U/L (ref 39–117)
BUN: 9 mg/dL (ref 6–23)
CO2: 30 mEq/L (ref 19–32)
Calcium: 9 mg/dL (ref 8.4–10.5)
Chloride: 89 mEq/L — ABNORMAL LOW (ref 96–112)
Creatinine, Ser: 0.76 mg/dL (ref 0.40–1.50)
GFR: 97.41 mL/min (ref >60.00)
Glucose, Bld: 260 mg/dL — ABNORMAL HIGH (ref 70–99)
Potassium: 4.4 mEq/L (ref 3.5–5.1)
Sodium: 126 mEq/L — ABNORMAL LOW (ref 135–145)
Total Bilirubin: 0.6 mg/dL (ref 0.2–1.2)
Total Protein: 6.5 g/dL (ref 6.0–8.3)

## 2020-07-21 LAB — POCT GLYCOSYLATED HEMOGLOBIN (HGB A1C): Hemoglobin A1C: 8.8 % — AB (ref 4.0–5.6)

## 2020-07-21 MED ORDER — GABAPENTIN 300 MG PO CAPS: 300.0000 mg | ORAL_CAPSULE | Freq: Three times a day (TID) | ORAL | 3 refills | Status: AC

## 2020-07-21 NOTE — Patient Instructions (Addendum)
Novolog 2 units for each starch at each meal For high sugar add 1 unit per 50mg  above 100  Toujeo replace Lantus and try 36 in am and 14 in pm

## 2020-07-21 NOTE — Progress Notes (Signed)
Patient ID: Peter Mendez., male   DOB: August 01, 1961, 59 y.o.   MRN: [id number]   Reason for Appointment: Endocrinology follow-up   History of Present Illness   Diagnosis: Type 1 diabetes mellitus, diagnosis 1991.  He has been on basal bolus insulin regimen for a few years His A1c previously was consistently around 7-7.5 He tends to have significant fluctuation in blood sugars at all times. He has difficulty keeping his postprandial blood sugars consistent with mealtime insulin coverage and estimating the amount of insulin needed for various kinds of foods since he does not count carbohydrates A1c has been higher since 2014  Recent history:   The insulin regimen is  Lantus 34--12 units at 10 AM and 10 PM; Novolog 3-5 acb 4-8 acl & acs    A1c slightly better at 8.8 compared to 9.3  Current diabetes management, problems identified and blood sugar patterns  He has been using 34 units Lantus in the morning, was afraid to go up to 36 as directed  As before his blood sugars have been fluctuating significantly at all times  However on an average his blood sugars are better and his time in range is improved from 40 up to 52  His treatment is again difficult because of unexpected episodes of hypoglycemia including once during the night  Otherwise hypoglycemia randomly occur midday, early afternoon and a total of 4 days in the last 2 weeks with blood sugars  [name] not able to explain why his blood sugars get low including yesterday after lunch  OVERNIGHT blood sugars are highly variable although running mostly higher in the last week  Also does not have any consistent POSTPRANDIAL hyperglycemia with random episodes of high sugars after various meals especially after rebound from low sugars  He may not be always taking enough insulin to cover his meals, took only 3 units for eating a sandwich yesterday  Overall his blood sugars are appearing to be averaging around 150-190 at any  given time of the day or night  CGM use % of time  98  2-week average/SD  175 GV 33  Time in range      52% was 40  % Time Above 180  34  % Time above 250  10  % Time Below 70 4     PRE-MEAL Fasting Lunch Dinner Bedtime Overall  Glucose range:       Averages:  167  154  174  197  175   POST-MEAL PC Breakfast PC Lunch PC Dinner  Glucose range:     Averages:  179  169 183     Previous data:   CGM use % of time 98  Average and SD  194+/-31  Time in range  40      %  % Time Above 180 43  % Time above 250 15  % Time Below target 2    PRE-MEAL Fasting Lunch Dinner Bedtime Overall  Glucose range:       Mean/median:  163  201  211   194   POST-MEAL PC Breakfast PC Lunch PC Dinner  Glucose range:     Mean/median:  182  215  214      Hypoglycemia awareness: Usually develops symptoms when blood glucose is less than 50, usually sweating.   Meals: Inconsistent schedule, Inconsistent quantity. Breakfast is at 6-7 am Supper 6-7 pm  Variable intake at breakfast, some oatmeal; 1/2 sandwich or peanut butter crackers at bedtime  Dietician visit: Most recent:, 4/09.   Wt Readings from Last 3 Encounters:  07/21/20 132 lb (59.9 kg)  04/28/20 131 lb (59.4 kg)  01/21/20 133 lb 9.6 oz (60.6 kg)    Lab Results  Component Value Date   HGBA1C 8.8 (A) 07/21/2020   HGBA1C 9.3 (A) 04/28/2020   HGBA1C 8.7 (A) 01/21/2020   Lab Results  Component Value Date   MICROALBUR 7.6 (H) 01/21/2020   LDLCALC 91 01/21/2020   CREATININE 0.97 04/28/2020    Other active problems: See review of systems     Allergies as of 07/21/2020      Reactions   Pravastatin Sodium    myalgia      Medication List       Accurate as of July 21, 2020  1:20 PM. If you have any questions, ask your nurse or doctor.        alprazolam 2 MG tablet Commonly known as: XANAX take 1 tablet by mouth twice a day if needed for anxiety What changed: additional instructions   Droplet Pen Needles 31G X  8 MM Misc Generic drug: Insulin Pen Needle USE TO INJECT  INSULIN FIVE TIMES DAILY   FreeStyle Libre 14 Day Sensor Misc 1 Units by Does not apply route every 14 (fourteen) days.   gabapentin 600 MG tablet Commonly known as: NEURONTIN Take 1 tablet 3 times a day as needed   ibuprofen 200 MG tablet Commonly known as: ADVIL Take 200 mg by mouth every 6 (six) hours as needed.   Lantus SoloStar 100 UNIT/ML Solostar Pen Generic drug: insulin glargine INJECT 30 UNITS SUBCUTANEOUSLY IN THE MORNING AND 32 UNITS IN THE EVENING. What changed: additional instructions   NovoLOG FlexPen 100 UNIT/ML FlexPen Generic drug: insulin aspart INJECT 6 TO 10 UNITS BEFORE MEALS AS DIRECTED  (PEN EXPIRES 28 DAYS AFTER OPENED)   onetouch ultrasoft lancets Use as instructed to check blood sugars 7 times per day   oxycodone 30 MG immediate release tablet Commonly known as: ROXICODONE 15 mg every 4 (four) hours as needed.   ramipril 5 MG capsule Commonly known as: ALTACE TAKE 1 CAPSULE EVERY DAY   rosuvastatin 5 MG tablet Commonly known as: Crestor Take 1 tablet (5 mg total) by mouth daily.   triamcinolone 0.1 % Commonly known as: KENALOG Apply topically 2 (two) times daily.       Allergies:  Allergies  Allergen Reactions  . Pravastatin Sodium     myalgia    Past Medical History:  Diagnosis Date  . Chicken pox   . Claudication (HCC)   . Diabetes 1.5, managed as type 1 (HCC)   . Hypertension   . Low back pain   . Tobacco abuse     Past Surgical History:  Procedure Laterality Date  . CATARACT EXTRACTION Bilateral   . INTRAOCULAR LENS INSERTION Bilateral   . RETINAL DETACHMENT SURGERY Bilateral     Family History  Problem Relation Age of Onset  . Diabetes Mother   . Atrial fibrillation Mother   . Heart attack Father   . Stroke Father   . Arthritis Father   . Heart disease Father   . Diabetes Maternal Uncle   . Diabetes Maternal Grandmother   . Heart attack Maternal  Grandmother   . Heart disease Maternal Grandmother   . Esophageal cancer Maternal Grandfather   . Heart attack Sister        at age 103    Social History:  reports that he  has been smoking cigarettes. He has a 31.50 pack-year smoking history. He has never used smokeless tobacco. He reports that he does not drink alcohol. No history on file for drug use.  Review of Systems   HYPERTENSION: Blood pressure is controlled although variable  Is on 5 mg ramipril  Also has blood pressure meter at home, recent readings: 130-140/70  BP Readings from Last 3 Encounters:  07/21/20 (!) 146/68  04/28/20 120/62  01/21/20 (!) 130/50     Lipids: On Crestor every other day for cardiovascular risk reduction with the following levels   Lab Results  Component Value Date   CHOL 152 01/21/2020   HDL 47.70 01/21/2020   LDLCALC 91 01/21/2020   TRIG 66.0 01/21/2020   CHOLHDL 3 01/21/2020    HYPONATREMIA: Long-standing and asymptomatic.  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   Demeclocycline was prescribed previously but  not covered by insurance.  Cortrosyn stimulation test was also normal   Urinary sodium in 6/21 was <20 and U Osm 509  [name] asymptomatic with no nausea or decreased appetite even when his sodium was 125 in 9/21  He does not think he is drinking excessive amounts of water or other drinks   Lab Results  Component Value Date   CREATININE 0.97 04/28/2020   BUN 14 04/28/2020   NA 125 (L) 04/28/2020   K 4.6 04/28/2020   CL 88 (L) 04/28/2020   CO2 32 04/28/2020    Lung cancer screening: He had a CT scan done through the pulmonologist and does not have any significant lesions but 2340-month exam is being scheduled  Chronic back pain. 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     Assesment/PLAN:  Diabetes type 1, long-standing on basal bolus insulin regimen   See history of present illness for detailed discussion of current blood sugar patterns, problems identified and current management  His A1c is 8.8  His blood sugars are highly variable and difficult to regulate Has only about 52% of his blood sugars within target range Most of his sugars are running high on an average around 170-180 both day and night Since he has occasional episodes of low sugars even overnight cannot increase his basal insulin any further  Today discussed that he needs to count carbohydrates and take at least 2 units NovoLog for each starch such as 4 units for a sandwich by itself Also advised him to use at least 1 unit for every 50 points to correct for high readings over 100 He should also avoid overtreating low sugars   Taking larger doses of Lantus in the morning since most of his hyperglycemia is between 10 AM and 10 PM as discussed above Although he had done better with [place] he again does not want to retry it because of fear of  hypoglycemia overnight  He will be given a trial of TOUJEO with a sample today for potentially more consistent control and less hypoglycemia He will let us know if this is working better and will switch him even though he has a supply of Lantus at home    2. . Hypertension: Blood pressure is controlled Continue monitoring at home  3.  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   Reather Littler 07/21/2020, 1:20 PM

## 2020-08-10 ENCOUNTER — Encounter: Payer: Self-pay | Admitting: Orthopedic Surgery

## 2020-08-10 ENCOUNTER — Ambulatory Visit (INDEPENDENT_AMBULATORY_CARE_PROVIDER_SITE_OTHER): Payer: Medicare Other

## 2020-08-10 ENCOUNTER — Ambulatory Visit (INDEPENDENT_AMBULATORY_CARE_PROVIDER_SITE_OTHER): Payer: Medicare Other | Admitting: Orthopedic Surgery

## 2020-08-10 VITALS — Ht 71.0 in | Wt 132.0 lb

## 2020-08-10 DIAGNOSIS — I739 Peripheral vascular disease, unspecified: Secondary | ICD-10-CM

## 2020-08-10 DIAGNOSIS — M25571 Pain in right ankle and joints of right foot: Secondary | ICD-10-CM | POA: Diagnosis not present

## 2020-08-10 NOTE — Progress Notes (Signed)
Office Visit Note   Patient: Peter Mendez.           Date of Birth: 1961/07/03           MRN: [id number] Visit Date: 08/10/2020              Requested by: Leilani Able, MD [address] [place],  [place] [address] PCP: Leilani Able, MD  Chief Complaint  Patient presents with  . Right Ankle - Pain      HPI: Patient is a 60 year old gentleman who is seen for initial evaluation for right ankle injury.  Patient states that he rolled his ankle and fell about a month ago.  Patient complains of pain around the ankle he states that his foot is red and swollen and has ischemic ulcers on the heel and great toe.  Patient is diabetic not on dialysis.  He states that he saw Dr. Allyson Sabal about 7 hours ago.  Assessment & Plan: Visit Diagnoses:  1. Pain in right ankle and joints of right foot   2. PVD (peripheral vascular disease) (HCC)     Plan: We will have patient follow-up with Dr. Allyson Sabal due to the ischemic changes to the right foot.  .  Follow-Up Instructions: Return in about 4 weeks (around 09/07/2020).   Ortho Exam  Patient is alert, oriented, no adenopathy, well-dressed, normal affect, normal respiratory effort. Examination patient has venous swelling with redness on the right leg and right foot.  There is no cellulitis no abscess.  Patient has an ischemic dry ulcer on the dorsum of the right great toe as well as a large black ischemic ulcer on the right heel that is 2 cm in diameter.  There is multiple ulcers around the ankle.  Patient is a smoker and has vision loss from his diabetes.  Patient's most recent hemoglobin A1c is 8.8.  Patient had monophasic dorsalis pedis and posterior tibial pulses.  Imaging: XR Ankle 2 Views Right  Result Date: 08/10/2020 2 view radiographs of the right ankle shows no evidence of fracture no widening of the syndesmosis there is calcification of the anterior tibial artery.  No images are attached to the encounter.  Labs: Lab Results  Component  Value Date   HGBA1C 8.8 (A) 07/21/2020   HGBA1C 9.3 (A) 04/28/2020   HGBA1C 8.7 (A) 01/21/2020     Lab Results  Component Value Date   ALBUMIN 3.6 07/21/2020   ALBUMIN 4.0 04/28/2020   ALBUMIN 4.1 01/21/2020    No results found for: MG No results found for: VD25OH  No results found for: PREALBUMIN No flowsheet data found.   Body mass index is 18.41 kg/m.  Orders:  Orders Placed This Encounter  Procedures  . XR Ankle 2 Views Right   No orders of the defined types were placed in this encounter.    Procedures: No procedures performed  Clinical Data: No additional findings.  ROS:  All other systems negative, except as noted in the HPI. Review of Systems  Objective: Vital Signs: Ht 5\' 11"  (1.803 m)   Wt 132 lb (59.9 kg)   BMI 18.41 kg/m   Specialty Comments:  No specialty comments available.  PMFS History: Patient Active Problem List   Diagnosis Date Noted  . Chronic low back pain 04/27/2020  . 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 Chronic back pain 12/29/2015  . Generalized anxiety disorder 12/29/2015  . Type 1 diabetes mellitus with neurological manifestations,  uncontrolled (HCC) 02/25/2013  . Essential hypertension 02/25/2013  . Pure hypercholesterolemia 02/25/2013   Past Medical History:  Diagnosis Date  . Chicken pox   . Claudication (HCC)   . Diabetes 1.5, managed as type 1 (HCC)   . Hypertension   . Low back pain   . Tobacco abuse     Family History  Problem Relation Age of Onset  . Diabetes Mother   . Atrial fibrillation Mother   . Heart attack Father   . Stroke Father   . Arthritis Father   . Heart disease Father   . Diabetes Maternal Uncle   . Diabetes Maternal Grandmother   . Heart attack Maternal Grandmother   . Heart disease Maternal Grandmother   . Esophageal cancer Maternal Grandfather   . Heart attack Sister        at age 67    Past Surgical History:  Procedure  Laterality Date  . CATARACT EXTRACTION Bilateral   . INTRAOCULAR LENS INSERTION Bilateral   . RETINAL DETACHMENT SURGERY Bilateral    Social History   Occupational History  . Not on file  Tobacco Use  . Smoking status: Current Every Day Smoker    Packs/day: 0.75    Years: 42.00    Pack years: 31.50    Types: Cigarettes  . Smokeless tobacco: Never Used  Substance and Sexual Activity  . Alcohol use: No    Alcohol/week: 0.0 standard drinks  . Drug use: Not on file  . Sexual activity: Not on file

## 2020-09-07 ENCOUNTER — Inpatient Hospital Stay (HOSPITAL_COMMUNITY)
Admission: EM | Admit: 2020-09-07 | Discharge: 2020-09-25 | DRG: 270 | Disposition: A | Discharge status: Home Health | Payer: Medicare Other | Attending: Internal Medicine | Admitting: Internal Medicine

## 2020-09-07 ENCOUNTER — Other Ambulatory Visit: Payer: Self-pay

## 2020-09-07 ENCOUNTER — Encounter: Payer: Self-pay | Admitting: Physician Assistant

## 2020-09-07 ENCOUNTER — Emergency Department (HOSPITAL_COMMUNITY): Payer: Medicare Other

## 2020-09-07 ENCOUNTER — Ambulatory Visit (INDEPENDENT_AMBULATORY_CARE_PROVIDER_SITE_OTHER): Payer: Medicare Other | Admitting: Physician Assistant

## 2020-09-07 VITALS — BP 164/65 | HR 77 | Temp 98.4°F

## 2020-09-07 DIAGNOSIS — E1152 Type 2 diabetes mellitus with diabetic peripheral angiopathy with gangrene: Principal | ICD-10-CM | POA: Diagnosis present

## 2020-09-07 DIAGNOSIS — L89152 Pressure ulcer of sacral region, stage 2: Secondary | ICD-10-CM | POA: Diagnosis present

## 2020-09-07 DIAGNOSIS — E11621 Type 2 diabetes mellitus with foot ulcer: Secondary | ICD-10-CM | POA: Diagnosis present

## 2020-09-07 DIAGNOSIS — I779 Disorder of arteries and arterioles, unspecified: Secondary | ICD-10-CM | POA: Diagnosis present

## 2020-09-07 DIAGNOSIS — I998 Other disorder of circulatory system: Secondary | ICD-10-CM | POA: Diagnosis not present

## 2020-09-07 DIAGNOSIS — M79604 Pain in right leg: Secondary | ICD-10-CM

## 2020-09-07 DIAGNOSIS — Z452 Encounter for adjustment and management of vascular access device: Secondary | ICD-10-CM

## 2020-09-07 DIAGNOSIS — Z823 Family history of stroke: Secondary | ICD-10-CM

## 2020-09-07 DIAGNOSIS — E11649 Type 2 diabetes mellitus with hypoglycemia without coma: Secondary | ICD-10-CM | POA: Diagnosis not present

## 2020-09-07 DIAGNOSIS — I350 Nonrheumatic aortic (valve) stenosis: Secondary | ICD-10-CM | POA: Diagnosis present

## 2020-09-07 DIAGNOSIS — L03115 Cellulitis of right lower limb: Secondary | ICD-10-CM | POA: Diagnosis present

## 2020-09-07 DIAGNOSIS — E139 Other specified diabetes mellitus without complications: Secondary | ICD-10-CM | POA: Diagnosis present

## 2020-09-07 DIAGNOSIS — H547 Unspecified visual loss: Secondary | ICD-10-CM | POA: Diagnosis present

## 2020-09-07 DIAGNOSIS — I483 Typical atrial flutter: Secondary | ICD-10-CM | POA: Diagnosis not present

## 2020-09-07 DIAGNOSIS — D75829 Heparin-induced thrombocytopenia, unspecified: Secondary | ICD-10-CM | POA: Diagnosis not present

## 2020-09-07 DIAGNOSIS — G9341 Metabolic encephalopathy: Secondary | ICD-10-CM | POA: Diagnosis not present

## 2020-09-07 DIAGNOSIS — R64 Cachexia: Secondary | ICD-10-CM | POA: Diagnosis not present

## 2020-09-07 DIAGNOSIS — Z888 Allergy status to other drugs, medicaments and biological substances status: Secondary | ICD-10-CM

## 2020-09-07 DIAGNOSIS — F05 Delirium due to known physiological condition: Secondary | ICD-10-CM | POA: Diagnosis not present

## 2020-09-07 DIAGNOSIS — T24002A Burn of unspecified degree of unspecified site of left lower limb, except ankle and foot, initial encounter: Secondary | ICD-10-CM | POA: Diagnosis present

## 2020-09-07 DIAGNOSIS — I4891 Unspecified atrial fibrillation: Secondary | ICD-10-CM

## 2020-09-07 DIAGNOSIS — L8990 Pressure ulcer of unspecified site, unspecified stage: Secondary | ICD-10-CM | POA: Insufficient documentation

## 2020-09-07 DIAGNOSIS — B356 Tinea cruris: Secondary | ICD-10-CM | POA: Diagnosis present

## 2020-09-07 DIAGNOSIS — E43 Unspecified severe protein-calorie malnutrition: Secondary | ICD-10-CM | POA: Diagnosis present

## 2020-09-07 DIAGNOSIS — Z9842 Cataract extraction status, left eye: Secondary | ICD-10-CM

## 2020-09-07 DIAGNOSIS — Z681 Body mass index (BMI) 19 or less, adult: Secondary | ICD-10-CM

## 2020-09-07 DIAGNOSIS — Z8261 Family history of arthritis: Secondary | ICD-10-CM

## 2020-09-07 DIAGNOSIS — W19XXXA Unspecified fall, initial encounter: Secondary | ICD-10-CM | POA: Diagnosis present

## 2020-09-07 DIAGNOSIS — Z9841 Cataract extraction status, right eye: Secondary | ICD-10-CM

## 2020-09-07 DIAGNOSIS — T8130XA Disruption of wound, unspecified, initial encounter: Secondary | ICD-10-CM | POA: Diagnosis not present

## 2020-09-07 DIAGNOSIS — I745 Embolism and thrombosis of iliac artery: Secondary | ICD-10-CM | POA: Diagnosis present

## 2020-09-07 DIAGNOSIS — I96 Gangrene, not elsewhere classified: Secondary | ICD-10-CM

## 2020-09-07 DIAGNOSIS — E1165 Type 2 diabetes mellitus with hyperglycemia: Secondary | ICD-10-CM | POA: Diagnosis present

## 2020-09-07 DIAGNOSIS — R54 Age-related physical debility: Secondary | ICD-10-CM | POA: Diagnosis present

## 2020-09-07 DIAGNOSIS — Z961 Presence of intraocular lens: Secondary | ICD-10-CM | POA: Diagnosis present

## 2020-09-07 DIAGNOSIS — Z833 Family history of diabetes mellitus: Secondary | ICD-10-CM

## 2020-09-07 DIAGNOSIS — I739 Peripheral vascular disease, unspecified: Secondary | ICD-10-CM | POA: Diagnosis not present

## 2020-09-07 DIAGNOSIS — I252 Old myocardial infarction: Secondary | ICD-10-CM

## 2020-09-07 DIAGNOSIS — I898 Other specified noninfective disorders of lymphatic vessels and lymph nodes: Secondary | ICD-10-CM | POA: Diagnosis present

## 2020-09-07 DIAGNOSIS — I70261 Atherosclerosis of native arteries of extremities with gangrene, right leg: Secondary | ICD-10-CM | POA: Diagnosis present

## 2020-09-07 DIAGNOSIS — E1140 Type 2 diabetes mellitus with diabetic neuropathy, unspecified: Secondary | ICD-10-CM | POA: Diagnosis present

## 2020-09-07 DIAGNOSIS — R471 Dysarthria and anarthria: Secondary | ICD-10-CM | POA: Diagnosis not present

## 2020-09-07 DIAGNOSIS — L97419 Non-pressure chronic ulcer of right heel and midfoot with unspecified severity: Secondary | ICD-10-CM | POA: Diagnosis present

## 2020-09-07 DIAGNOSIS — Z20822 Contact with and (suspected) exposure to covid-19: Secondary | ICD-10-CM | POA: Diagnosis present

## 2020-09-07 DIAGNOSIS — F13239 Sedative, hypnotic or anxiolytic dependence with withdrawal, unspecified: Secondary | ICD-10-CM | POA: Diagnosis not present

## 2020-09-07 DIAGNOSIS — F17210 Nicotine dependence, cigarettes, uncomplicated: Secondary | ICD-10-CM | POA: Diagnosis present

## 2020-09-07 DIAGNOSIS — R29810 Facial weakness: Secondary | ICD-10-CM | POA: Diagnosis not present

## 2020-09-07 DIAGNOSIS — E785 Hyperlipidemia, unspecified: Secondary | ICD-10-CM | POA: Diagnosis present

## 2020-09-07 DIAGNOSIS — E871 Hypo-osmolality and hyponatremia: Secondary | ICD-10-CM | POA: Diagnosis present

## 2020-09-07 DIAGNOSIS — Z8249 Family history of ischemic heart disease and other diseases of the circulatory system: Secondary | ICD-10-CM

## 2020-09-07 DIAGNOSIS — T148XXA Other injury of unspecified body region, initial encounter: Secondary | ICD-10-CM

## 2020-09-07 DIAGNOSIS — I480 Paroxysmal atrial fibrillation: Secondary | ICD-10-CM | POA: Diagnosis not present

## 2020-09-07 DIAGNOSIS — S80821A Blister (nonthermal), right lower leg, initial encounter: Secondary | ICD-10-CM | POA: Diagnosis present

## 2020-09-07 DIAGNOSIS — D509 Iron deficiency anemia, unspecified: Secondary | ICD-10-CM | POA: Diagnosis present

## 2020-09-07 DIAGNOSIS — F419 Anxiety disorder, unspecified: Secondary | ICD-10-CM | POA: Diagnosis present

## 2020-09-07 DIAGNOSIS — D7582 Heparin induced thrombocytopenia (HIT): Secondary | ICD-10-CM | POA: Diagnosis present

## 2020-09-07 DIAGNOSIS — I10 Essential (primary) hypertension: Secondary | ICD-10-CM | POA: Diagnosis present

## 2020-09-07 DIAGNOSIS — E876 Hypokalemia: Secondary | ICD-10-CM | POA: Diagnosis not present

## 2020-09-07 HISTORY — DX: Peripheral vascular disease, unspecified: I73.9

## 2020-09-07 LAB — CBC WITH DIFFERENTIAL/PLATELET
Abs Immature Granulocytes: 0.04 10*3/uL (ref 0.00–0.07)
Basophils Absolute: 0 10*3/uL (ref 0.0–0.1)
Basophils Relative: 0 %
Eosinophils Absolute: 0.1 10*3/uL (ref 0.0–0.5)
Eosinophils Relative: 1 %
HCT: 37.7 % — ABNORMAL LOW (ref 39.0–52.0)
Hemoglobin: 13 g/dL (ref 13.0–17.0)
Immature Granulocytes: 0 %
Lymphocytes Relative: 7 %
Lymphs Abs: 0.7 10*3/uL (ref 0.7–4.0)
MCH: 29.1 pg (ref 26.0–34.0)
MCHC: 34.5 g/dL (ref 30.0–36.0)
MCV: 84.3 fL (ref 80.0–100.0)
Monocytes Absolute: 0.6 10*3/uL (ref 0.1–1.0)
Monocytes Relative: 6 %
Neutro Abs: 8.6 10*3/uL — ABNORMAL HIGH (ref 1.7–7.7)
Neutrophils Relative %: 86 %
Platelets: 239 10*3/uL (ref 150–400)
RBC: 4.47 MIL/uL (ref 4.22–5.81)
RDW: 13.5 % (ref 11.5–15.5)
WBC: 10 10*3/uL (ref 4.0–10.5)
nRBC: 0 % (ref 0.0–0.2)

## 2020-09-07 LAB — COMPREHENSIVE METABOLIC PANEL
ALT: 10 U/L (ref 0–44)
AST: 17 U/L (ref 15–41)
Albumin: 2.9 g/dL — ABNORMAL LOW (ref 3.5–5.0)
Alkaline Phosphatase: 112 U/L (ref 38–126)
Anion gap: 11 (ref 5–15)
BUN: 6 mg/dL — ABNORMAL LOW (ref 8–23)
CO2: 27 mmol/L (ref 22–32)
Calcium: 8.9 mg/dL (ref 8.9–10.3)
Chloride: 87 mmol/L — ABNORMAL LOW (ref 98–111)
Creatinine, Ser: 0.75 mg/dL (ref 0.61–1.24)
GFR, Estimated: >60 mL/min (ref >60)
Glucose, Bld: 289 mg/dL — ABNORMAL HIGH (ref 70–99)
Potassium: 4.9 mmol/L (ref 3.5–5.1)
Sodium: 125 mmol/L — ABNORMAL LOW (ref 135–145)
Total Bilirubin: 1 mg/dL (ref 0.3–1.2)
Total Protein: 6.9 g/dL (ref 6.5–8.1)

## 2020-09-07 LAB — LACTIC ACID, PLASMA: Lactic Acid, Venous: 1.9 mmol/L (ref 0.5–1.9)

## 2020-09-07 NOTE — Progress Notes (Addendum)
Office Visit Note   Patient: Peter Mendez.           Date of Birth: [birth year]           MRN: [id number] Visit Date: 09/07/2020              Requested by: Leilani Able, MD [address] [place],  [place] [address] PCP: Leilani Able, MD  No chief complaint on file.     HPI: Patient a 60 year old gentleman with a history of a right foot ischemic ulcers about his ankle and heel.  He was last seen by Dr. Lajoyce Corners on January 3.  Patient is a diabetic and smokes about half a pack of cigarettes a day.  Dr. Lajoyce Corners had concerns for critical limb ischemia and requested an urgent referral to Dr. Allyson Sabal.  Referral was placed.  Patient was told the earliest he could be seen was February 22 and an appointment was made.  In the meantime the patient presents today with increasing pain of the right foot.  [name] vision impaired.  Assessment & Plan: Visit Diagnoses: No diagnosis found.  Plan: I have referred the patient to the emergency room to be admitted to the hospitalist service for IV antibiotics as well as evaluation by vascular.  We will consult on him tomorrow when Dr. Lajoyce Corners referred returns to the office patient understands the importance of going to the emergency room as he had is at high risk of sepsis and limb loss.  He does have someone with him.  Also was counseled about the role of tobacco in his disease.  Follow-Up Instructions: No follow-ups on file.   Ortho Exam  Patient is alert, oriented, no adenopathy, well-dressed, normal affect, normal respiratory effort. Right lower extremity he has monophasic dorsalis pedis pulse that was difficult to auscultate by Doppler.  Also difficult with barely audible posterior tibial monophasic pulse.    Ischemic ulcers have increased from previous exam.  He now has a large ischemic ulcer on the medial side of his ankle that measures 4 cm x 7 cm.  Also a large heel ulcer that has increased from previous measurement of 2 cm in diameter.  He has ulcers around his  leg on the medial side.  Also ulcer on the toe and dorsum of his foot there is surrounding erythema.  [name] extremely tender.  Vital signs are stable  Imaging: No results found.     Labs: Lab Results  Component Value Date   HGBA1C 8.8 (A) 07/21/2020   HGBA1C 9.3 (A) 04/28/2020   HGBA1C 8.7 (A) 01/21/2020     Lab Results  Component Value Date   ALBUMIN 3.6 07/21/2020   ALBUMIN 4.0 04/28/2020   ALBUMIN 4.1 01/21/2020    No results found for: MG No results found for: VD25OH  No results found for: PREALBUMIN No flowsheet data found.   There is no height or weight on file to calculate BMI.  Orders:  No orders of the defined types were placed in this encounter.  No orders of the defined types were placed in this encounter.    Procedures: No procedures performed  Clinical Data: No additional findings.  ROS:  All other systems negative, except as noted in the HPI. Review of Systems  Objective: Vital Signs: There were no vitals taken for this visit.  Specialty Comments:  No specialty comments available.  PMFS History: Patient Active Problem List   Diagnosis Date Noted  . Chronic low back pain  04/27/2020  . 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 Chronic back pain 12/29/2015  . Generalized anxiety disorder 12/29/2015  . Type 1 diabetes mellitus with neurological manifestations, uncontrolled (HCC) 02/25/2013  . Essential hypertension 02/25/2013  . Pure hypercholesterolemia 02/25/2013   Past Medical History:  Diagnosis Date  . Chicken pox   . Claudication (HCC)   . Diabetes 1.5, managed as type 1 (HCC)   . Hypertension   . Low back pain   . Tobacco abuse     Family History  Problem Relation Age of Onset  . Diabetes Mother   . Atrial fibrillation Mother   . Heart attack Father   . Stroke Father   . Arthritis Father   . Heart disease Father   . Diabetes Maternal Uncle   . Diabetes Maternal  Grandmother   . Heart attack Maternal Grandmother   . Heart disease Maternal Grandmother   . Esophageal cancer Maternal Grandfather   . Heart attack Sister        at age 13    Past Surgical History:  Procedure Laterality Date  . CATARACT EXTRACTION Bilateral   . INTRAOCULAR LENS INSERTION Bilateral   . RETINAL DETACHMENT SURGERY Bilateral    Social History   Occupational History  . Not on file  Tobacco Use  . Smoking status: Current Every Day Smoker    Packs/day: 0.75    Years: 42.00    Pack years: 31.50    Types: Cigarettes  . Smokeless tobacco: Never Used  Substance and Sexual Activity  . Alcohol use: No    Alcohol/week: 0.0 standard drinks  . Drug use: Not on file  . Sexual activity: Not on file

## 2020-09-07 NOTE — ED Triage Notes (Signed)
Pt sent from Dr. Audrie Lia office for further eval of ischemic ulcers on R foot/ankle. Hx diabetes, wounds not healing x several weeks.

## 2020-09-08 ENCOUNTER — Emergency Department (HOSPITAL_COMMUNITY): Payer: Medicare Other

## 2020-09-08 ENCOUNTER — Telehealth: Payer: Self-pay | Admitting: Radiology

## 2020-09-08 DIAGNOSIS — Z20822 Contact with and (suspected) exposure to covid-19: Secondary | ICD-10-CM | POA: Diagnosis present

## 2020-09-08 DIAGNOSIS — I739 Peripheral vascular disease, unspecified: Secondary | ICD-10-CM

## 2020-09-08 DIAGNOSIS — D696 Thrombocytopenia, unspecified: Secondary | ICD-10-CM | POA: Diagnosis not present

## 2020-09-08 DIAGNOSIS — E13649 Other specified diabetes mellitus with hypoglycemia without coma: Secondary | ICD-10-CM | POA: Diagnosis not present

## 2020-09-08 DIAGNOSIS — Z681 Body mass index (BMI) 19 or less, adult: Secondary | ICD-10-CM | POA: Diagnosis not present

## 2020-09-08 DIAGNOSIS — Z01810 Encounter for preprocedural cardiovascular examination: Secondary | ICD-10-CM

## 2020-09-08 DIAGNOSIS — E871 Hypo-osmolality and hyponatremia: Secondary | ICD-10-CM | POA: Diagnosis present

## 2020-09-08 DIAGNOSIS — R931 Abnormal findings on diagnostic imaging of heart and coronary circulation: Secondary | ICD-10-CM | POA: Diagnosis not present

## 2020-09-08 DIAGNOSIS — L03115 Cellulitis of right lower limb: Secondary | ICD-10-CM | POA: Diagnosis present

## 2020-09-08 DIAGNOSIS — T8130XA Disruption of wound, unspecified, initial encounter: Secondary | ICD-10-CM | POA: Diagnosis not present

## 2020-09-08 DIAGNOSIS — Z888 Allergy status to other drugs, medicaments and biological substances status: Secondary | ICD-10-CM | POA: Diagnosis not present

## 2020-09-08 DIAGNOSIS — I779 Disorder of arteries and arterioles, unspecified: Secondary | ICD-10-CM | POA: Diagnosis not present

## 2020-09-08 DIAGNOSIS — E11621 Type 2 diabetes mellitus with foot ulcer: Secondary | ICD-10-CM | POA: Diagnosis present

## 2020-09-08 DIAGNOSIS — M79604 Pain in right leg: Secondary | ICD-10-CM | POA: Diagnosis not present

## 2020-09-08 DIAGNOSIS — R001 Bradycardia, unspecified: Secondary | ICD-10-CM | POA: Diagnosis not present

## 2020-09-08 DIAGNOSIS — D7582 Heparin induced thrombocytopenia (HIT): Secondary | ICD-10-CM | POA: Diagnosis present

## 2020-09-08 DIAGNOSIS — I483 Typical atrial flutter: Secondary | ICD-10-CM | POA: Diagnosis not present

## 2020-09-08 DIAGNOSIS — I714 Abdominal aortic aneurysm, without rupture: Secondary | ICD-10-CM | POA: Diagnosis not present

## 2020-09-08 DIAGNOSIS — I10 Essential (primary) hypertension: Secondary | ICD-10-CM | POA: Diagnosis not present

## 2020-09-08 DIAGNOSIS — I771 Stricture of artery: Secondary | ICD-10-CM | POA: Diagnosis not present

## 2020-09-08 DIAGNOSIS — B356 Tinea cruris: Secondary | ICD-10-CM | POA: Diagnosis present

## 2020-09-08 DIAGNOSIS — I96 Gangrene, not elsewhere classified: Secondary | ICD-10-CM | POA: Diagnosis present

## 2020-09-08 DIAGNOSIS — D509 Iron deficiency anemia, unspecified: Secondary | ICD-10-CM | POA: Diagnosis present

## 2020-09-08 DIAGNOSIS — I480 Paroxysmal atrial fibrillation: Secondary | ICD-10-CM | POA: Diagnosis not present

## 2020-09-08 DIAGNOSIS — G9341 Metabolic encephalopathy: Secondary | ICD-10-CM | POA: Diagnosis not present

## 2020-09-08 DIAGNOSIS — I700 Atherosclerosis of aorta: Secondary | ICD-10-CM | POA: Diagnosis not present

## 2020-09-08 DIAGNOSIS — R739 Hyperglycemia, unspecified: Secondary | ICD-10-CM | POA: Diagnosis not present

## 2020-09-08 DIAGNOSIS — W19XXXA Unspecified fall, initial encounter: Secondary | ICD-10-CM | POA: Diagnosis present

## 2020-09-08 DIAGNOSIS — T148XXA Other injury of unspecified body region, initial encounter: Secondary | ICD-10-CM | POA: Diagnosis not present

## 2020-09-08 DIAGNOSIS — E11649 Type 2 diabetes mellitus with hypoglycemia without coma: Secondary | ICD-10-CM | POA: Diagnosis not present

## 2020-09-08 DIAGNOSIS — L89152 Pressure ulcer of sacral region, stage 2: Secondary | ICD-10-CM | POA: Diagnosis present

## 2020-09-08 DIAGNOSIS — R011 Cardiac murmur, unspecified: Secondary | ICD-10-CM | POA: Diagnosis not present

## 2020-09-08 DIAGNOSIS — E139 Other specified diabetes mellitus without complications: Secondary | ICD-10-CM | POA: Diagnosis not present

## 2020-09-08 DIAGNOSIS — E1152 Type 2 diabetes mellitus with diabetic peripheral angiopathy with gangrene: Secondary | ICD-10-CM | POA: Diagnosis present

## 2020-09-08 DIAGNOSIS — I252 Old myocardial infarction: Secondary | ICD-10-CM | POA: Diagnosis not present

## 2020-09-08 DIAGNOSIS — I743 Embolism and thrombosis of arteries of the lower extremities: Secondary | ICD-10-CM | POA: Diagnosis not present

## 2020-09-08 DIAGNOSIS — I70261 Atherosclerosis of native arteries of extremities with gangrene, right leg: Secondary | ICD-10-CM | POA: Diagnosis not present

## 2020-09-08 DIAGNOSIS — Z720 Tobacco use: Secondary | ICD-10-CM | POA: Diagnosis not present

## 2020-09-08 DIAGNOSIS — R9431 Abnormal electrocardiogram [ECG] [EKG]: Secondary | ICD-10-CM

## 2020-09-08 DIAGNOSIS — F05 Delirium due to known physiological condition: Secondary | ICD-10-CM | POA: Diagnosis not present

## 2020-09-08 DIAGNOSIS — F13239 Sedative, hypnotic or anxiolytic dependence with withdrawal, unspecified: Secondary | ICD-10-CM | POA: Diagnosis not present

## 2020-09-08 DIAGNOSIS — L97419 Non-pressure chronic ulcer of right heel and midfoot with unspecified severity: Secondary | ICD-10-CM | POA: Diagnosis present

## 2020-09-08 DIAGNOSIS — E43 Unspecified severe protein-calorie malnutrition: Secondary | ICD-10-CM | POA: Diagnosis present

## 2020-09-08 DIAGNOSIS — I998 Other disorder of circulatory system: Secondary | ICD-10-CM | POA: Diagnosis present

## 2020-09-08 DIAGNOSIS — R64 Cachexia: Secondary | ICD-10-CM | POA: Diagnosis not present

## 2020-09-08 DIAGNOSIS — E1140 Type 2 diabetes mellitus with diabetic neuropathy, unspecified: Secondary | ICD-10-CM | POA: Diagnosis present

## 2020-09-08 LAB — BASIC METABOLIC PANEL
Anion gap: 9 (ref 5–15)
BUN: <5 mg/dL — ABNORMAL LOW (ref 8–23)
CO2: 27 mmol/L (ref 22–32)
Calcium: 8.3 mg/dL — ABNORMAL LOW (ref 8.9–10.3)
Chloride: 92 mmol/L — ABNORMAL LOW (ref 98–111)
Creatinine, Ser: 0.59 mg/dL — ABNORMAL LOW (ref 0.61–1.24)
GFR, Estimated: >60 mL/min (ref >60)
Glucose, Bld: 206 mg/dL — ABNORMAL HIGH (ref 70–99)
Potassium: 4.1 mmol/L (ref 3.5–5.1)
Sodium: 128 mmol/L — ABNORMAL LOW (ref 135–145)

## 2020-09-08 LAB — CBG MONITORING, ED
Glucose-Capillary: 198 mg/dL — ABNORMAL HIGH (ref 70–99)
Glucose-Capillary: 146 mg/dL — ABNORMAL HIGH (ref 70–99)
Glucose-Capillary: 294 mg/dL — ABNORMAL HIGH (ref 70–99)

## 2020-09-08 LAB — LACTIC ACID, PLASMA: Lactic Acid, Venous: 1.1 mmol/L (ref 0.5–1.9)

## 2020-09-08 LAB — MRSA PCR SCREENING: MRSA by PCR: POSITIVE — AB

## 2020-09-08 LAB — SARS CORONAVIRUS 2 BY RT PCR (HOSPITAL ORDER, PERFORMED IN ~~LOC~~ HOSPITAL LAB): SARS Coronavirus 2: NEGATIVE

## 2020-09-08 MED ORDER — ALPRAZOLAM 0.25 MG PO TABS
1.0000 mg | ORAL_TABLET | Freq: Three times a day (TID) | ORAL | Status: DC | PRN
  Administered 2020-09-10: 1 mg via ORAL
  Filled 2020-09-08: qty 4

## 2020-09-08 MED ORDER — SENNA 8.6 MG PO TABS
1.0000 | ORAL_TABLET | Freq: Every day | ORAL | Status: DC
  Administered 2020-09-09 – 2020-09-10 (×2): 8.6 mg via ORAL
  Filled 2020-09-08 (×2): qty 1

## 2020-09-08 MED ORDER — ALPRAZOLAM 0.25 MG PO TABS: 1.0000 mg | ORAL_TABLET | Freq: Two times a day (BID) | ORAL | Status: DC | PRN

## 2020-09-08 MED ORDER — MORPHINE SULFATE (PF) 2 MG/ML IV SOLN
2.0000 mg | INTRAVENOUS | Status: DC | PRN
  Administered 2020-09-08 – 2020-09-09 (×4): 2 mg via INTRAVENOUS
  Filled 2020-09-08 (×4): qty 1

## 2020-09-08 MED ORDER — INSULIN GLARGINE 100 UNIT/ML ~~LOC~~ SOLN
25.0000 [IU] | Freq: Every day | SUBCUTANEOUS | Status: DC
  Administered 2020-09-08: 25 [IU] via SUBCUTANEOUS
  Filled 2020-09-08 (×2): qty 0.25

## 2020-09-08 MED ORDER — ACETAMINOPHEN 325 MG PO TABS: 650.0000 mg | ORAL_TABLET | Freq: Four times a day (QID) | ORAL | Status: DC | PRN

## 2020-09-08 MED ORDER — GABAPENTIN 300 MG PO CAPS
300.0000 mg | ORAL_CAPSULE | Freq: Three times a day (TID) | ORAL | Status: DC
  Administered 2020-09-08 – 2020-09-10 (×9): 300 mg via ORAL
  Filled 2020-09-08 (×9): qty 1

## 2020-09-08 MED ORDER — INSULIN ASPART 100 UNIT/ML ~~LOC~~ SOLN
0.0000 [IU] | Freq: Three times a day (TID) | SUBCUTANEOUS | Status: DC
  Administered 2020-09-08: 2 [IU] via SUBCUTANEOUS
  Administered 2020-09-09: 3 [IU] via SUBCUTANEOUS

## 2020-09-08 MED ORDER — HYDROCODONE-ACETAMINOPHEN 5-325 MG PO TABS
1.0000 | ORAL_TABLET | Freq: Four times a day (QID) | ORAL | Status: DC | PRN
  Administered 2020-09-08: 1 via ORAL
  Filled 2020-09-08: qty 1

## 2020-09-08 MED ORDER — SODIUM CHLORIDE 0.9 % IV BOLUS
1000.0000 mL | Freq: Once | INTRAVENOUS | Status: AC
  Administered 2020-09-08: 1000 mL via INTRAVENOUS

## 2020-09-08 MED ORDER — IOHEXOL 350 MG/ML SOLN
100.0000 mL | Freq: Once | INTRAVENOUS | Status: AC | PRN
  Administered 2020-09-08: 100 mL via INTRAVENOUS

## 2020-09-08 MED ORDER — SODIUM CHLORIDE 0.9 % IV SOLN
2.0000 g | Freq: Once | INTRAVENOUS | Status: AC
  Administered 2020-09-08: 2 g via INTRAVENOUS
  Filled 2020-09-08: qty 20

## 2020-09-08 MED ORDER — INSULIN GLARGINE 100 UNIT/ML ~~LOC~~ SOLN
25.0000 [IU] | Freq: Every day | SUBCUTANEOUS | Status: DC
  Filled 2020-09-08: qty 0.25

## 2020-09-08 MED ORDER — HYDROMORPHONE HCL 1 MG/ML IJ SOLN
0.5000 mg | Freq: Once | INTRAMUSCULAR | Status: AC
  Administered 2020-09-08: 0.5 mg via INTRAVENOUS
  Filled 2020-09-08: qty 1

## 2020-09-08 MED ORDER — HYDROMORPHONE HCL 1 MG/ML IJ SOLN: 0.5000 mg | INTRAMUSCULAR | Status: DC | PRN

## 2020-09-08 MED ORDER — NICOTINE 21 MG/24HR TD PT24
21.0000 mg | MEDICATED_PATCH | Freq: Every day | TRANSDERMAL | Status: DC
  Administered 2020-09-08 – 2020-09-25 (×17): 21 mg via TRANSDERMAL
  Filled 2020-09-08 (×17): qty 1

## 2020-09-08 MED ORDER — ACETAMINOPHEN 650 MG RE SUPP: 650.0000 mg | Freq: Four times a day (QID) | RECTAL | Status: DC | PRN

## 2020-09-08 MED ORDER — RAMIPRIL 5 MG PO CAPS
5.0000 mg | ORAL_CAPSULE | Freq: Every day | ORAL | Status: DC
  Administered 2020-09-09 – 2020-09-10 (×2): 5 mg via ORAL
  Filled 2020-09-08 (×3): qty 1

## 2020-09-08 MED ORDER — INSULIN ASPART 100 UNIT/ML ~~LOC~~ SOLN
0.0000 [IU] | Freq: Every day | SUBCUTANEOUS | Status: DC
  Administered 2020-09-08: 3 [IU] via SUBCUTANEOUS

## 2020-09-08 MED ORDER — HEPARIN SODIUM (PORCINE) 5000 UNIT/ML IJ SOLN
5000.0000 [IU] | Freq: Three times a day (TID) | INTRAMUSCULAR | Status: DC
  Administered 2020-09-08 – 2020-09-15 (×19): 5000 [IU] via SUBCUTANEOUS
  Filled 2020-09-08 (×19): qty 1

## 2020-09-08 MED ORDER — SODIUM CHLORIDE 0.9 % IV SOLN
2.0000 g | INTRAVENOUS | Status: AC
  Administered 2020-09-09 – 2020-09-14 (×6): 2 g via INTRAVENOUS
  Filled 2020-09-08 (×5): qty 20
  Filled 2020-09-08 (×2): qty 2

## 2020-09-08 MED ORDER — OXYCODONE-ACETAMINOPHEN 5-325 MG PO TABS
1.0000 | ORAL_TABLET | ORAL | Status: DC | PRN
  Administered 2020-09-08: 17:00:00 2 via ORAL
  Administered 2020-09-08: 23:00:00 1 via ORAL
  Administered 2020-09-09 (×2): 2 via ORAL
  Administered 2020-09-09 (×2): 1 via ORAL
  Administered 2020-09-10 (×4): 2 via ORAL
  Filled 2020-09-08 (×2): qty 2
  Filled 2020-09-08 (×3): qty 1
  Filled 2020-09-08 (×5): qty 2
  Filled 2020-09-08: qty 1
  Filled 2020-09-08 (×2): qty 2

## 2020-09-08 MED ORDER — HYDRALAZINE HCL 25 MG PO TABS: 25.0000 mg | ORAL_TABLET | Freq: Three times a day (TID) | ORAL | Status: DC

## 2020-09-08 NOTE — ED Provider Notes (Signed)
[facility] DEPT [facility] Emergency Department Provider Note MRN:  [id number]  Arrival date & time: 09/08/20     Chief Complaint   Leg pain History of Present Illness   Peter Mendez. is a 60 y.o. year-old male with a history of diabetes presenting to the ED with chief complaint of leg pain.  Patient explains that he fell and twisted his ankle 1 or 2 weeks ago.  He was told to put a boot on his foot and ankle to support it.  When he took the boot off recently he noticed that he developed black sores where the boot was.  [name] complaining of worsening pain to the right ankle and foot, some redness.  Denies fever, no other plaints.  No chest pain or shortness of breath, no abdominal pain.  Sent here from orthopedic clinic for evaluation.  Review of Systems  A complete 10 system review of systems was obtained and all systems are negative except as noted in the HPI and PMH.   Patient's Health History    Past Medical History:  Diagnosis Date  . Chicken pox   . Claudication (HCC)   . Diabetes 1.5, managed as type 1 (HCC)   . Hypertension   . Low back pain   . Tobacco abuse     Past Surgical History:  Procedure Laterality Date  . CATARACT EXTRACTION Bilateral   . INTRAOCULAR LENS INSERTION Bilateral   . RETINAL DETACHMENT SURGERY Bilateral     Family History  Problem Relation Age of Onset  . Diabetes Mother   . Atrial fibrillation Mother   . Heart attack Father   . Stroke Father   . Arthritis Father   . Heart disease Father   . Diabetes Maternal Uncle   . Diabetes Maternal Grandmother   . Heart attack Maternal Grandmother   . Heart disease Maternal Grandmother   . Esophageal cancer Maternal Grandfather   . Heart attack Sister        at age 3    Social History   Socioeconomic History  . Marital status: Single    Spouse name: Not on file  . Number of children: Not on file  . Years of education: Not on file  . Highest education level: Not on file   Occupational History  . Not on file  Tobacco Use  . Smoking status: Current Every Day Smoker    Packs/day: 0.75    Years: 42.00    Pack years: 31.50    Types: Cigarettes  . Smokeless tobacco: Never Used  Substance and Sexual Activity  . Alcohol use: No    Alcohol/week: 0.0 standard drinks  . Drug use: Not on file  . Sexual activity: Not on file  Other Topics Concern  . Not on file  Social History Narrative  . Not on file   Social Determinants of Health   Financial Resource Strain: Not on file  Food Insecurity: Not on file  Transportation Needs: Not on file  Physical Activity: Not on file  Stress: Not on file  Social Connections: Not on file  Intimate Partner Violence: Not on file     Physical Exam   Vitals:   09/08/20 0150 09/08/20 0417  BP: (!) 133/58 120/62  Pulse: 71 82  Resp: 17 18  Temp:    SpO2: 98% 98%    CONSTITUTIONAL: Chronically ill-appearing, NAD NEURO:  Alert and oriented x 3, no focal deficits EYES:  eyes equal and reactive ENT/NECK:  no  LAD, no JVD CARDIO: Regular rate, well-perfused, normal S1 and S2 PULM:  CTAB no wheezing or rhonchi GI/GU:  normal bowel sounds, non-distended, non-tender MSK/SPINE: Poor cap refill to the right foot SKIN: See below for image PSYCH:  Appropriate speech and behavior  *Additional and/or pertinent findings included in MDM below   Media Information         Document Information  Photos    09/08/2020 06:11  Attached To:  Hospital Encounter on 09/07/20   Source Information  Marlen Koman, Elmer Sow, MD  [facility]     Diagnostic and Interventional Summary    EKG Interpretation  Date/Time:    Ventricular Rate:    PR Interval:    QRS Duration:   QT Interval:    QTC Calculation:   R Axis:     Text Interpretation:        Labs Reviewed  COMPREHENSIVE METABOLIC PANEL - Abnormal; Notable for the following components:      Result Value   Sodium 125 (*)    Chloride 87 (*)    Glucose, Bld  289 (*)    BUN 6 (*)    Albumin 2.9 (*)    All other components within normal limits  CBC WITH DIFFERENTIAL/PLATELET - Abnormal; Notable for the following components:   HCT 37.7 (*)    Neutro Abs 8.6 (*)    All other components within normal limits  SARS CORONAVIRUS 2 BY RT PCR (HOSPITAL ORDER, PERFORMED IN [facility])  LACTIC ACID, PLASMA  LACTIC ACID, PLASMA    DG Foot 2 Views Right  Final Result    DG Ankle Complete Right  Final Result    CT Angio Aortobifemoral W and/or Wo Contrast    (Results Pending)    Medications  HYDROmorphone (DILAUDID) injection 0.5 mg (has no administration in time range)  sodium chloride 0.9 % bolus 1,000 mL (has no administration in time range)  cefTRIAXone (ROCEPHIN) 2 g in sodium chloride 0.9 % 100 mL IVPB (has no administration in time range)     Procedures  /  Critical Care Procedures  ED Course and Medical Decision Making  I have reviewed the triage vital signs, the nursing notes, and pertinent available records from the EMR.  Listed above are laboratory and imaging tests that I personally ordered, reviewed, and interpreted and then considered in my medical decision making (see below for details).  Concern for arterial insufficiency of the right lower extremity, see wounds above.  Hemodynamically stable, no fever or systemic symptoms of infection.  Given the poor cap refill in the wounds, will obtain runoff study.  Suspect will need to be admitted with either orthopedic or vascular consultation.  Signed out to oncoming provider at shift change.       Elmer Sow. Pilar Plate, MD [facility] Emergency Medicine [facility] [email or web address]  Final Clinical Impressions(s) / ED Diagnoses     ICD-10-CM   1. Multiple wounds of skin  T14.8XXA   2. Pain of right lower extremity  M79.604     ED Discharge Orders    None       Discharge Instructions Discussed with and Provided to Patient:   Discharge Instructions    None       Sabas Sous, MD 09/08/20 [phone]

## 2020-09-08 NOTE — ED Notes (Signed)
Pt given ice chips, ok by admitting doctor

## 2020-09-08 NOTE — Telephone Encounter (Signed)
Toniann Fail, thank you for the note I called the patient's sister informed her that we have consulted vascular surgery and patient is scheduled for revascularization on Friday.

## 2020-09-08 NOTE — ED Provider Notes (Signed)
I received this patient in signout from Dr. Pilar Plate.  Briefly, patient had presented with worsening right foot and ankle wounds, had seen Dr. Lajoyce Corners and concern for peripheral vascular disease.  At time of signout, patient had received antibiotics and was awaiting CT angio with runoff to evaluate his limb ischemia.  Plan to admit to medicine with orthopedics consult.  IMPRESSION:  VASCULAR   1. Occluded infrarenal abdominal aorta and bilateral common and  external iliac arteries. Flow reconstitutes in the common femoral  arteries from prominent inferior epigastric and deep circumflex  iliac artery branches. High-grade stenosis noted at the junction of  the inferior epigastric arteries and common femoral arteries  bilaterally.  2. Right superficial femoral artery is occluded with exception of  the proximal 5 cm.  3. Heavily diseased left superficial femoral artery with multiple  foci of near complete occlusion in the distal half.   NON-VASCULAR   No acute abnormality of the abdomen or pelvis.    CTA results as above. Discussed w/ ortho PA Earney Hamburg who will discuss w/ Dr. Lajoyce Corners. Pt likely will need vascular consult while in hospital.  Discussed admission w/ Internal Med teaching service, Asher Muir.    Little, Ambrose Finland, MD 09/08/20 1032

## 2020-09-08 NOTE — Consult Note (Signed)
Cardiology Consultation:   Patient ID: Irma Newness. MRN: [id number]; DOB: 10-29-1960  Admit date: 09/07/2020 Date of Consult: 09/08/2020  Primary Care Provider: Leilani Able, MD [facility] Cardiologist: No primary care provider on file.  [facility] Electrophysiologist:  None    Patient Profile:   Draiden Olshansky. is a 60 y.o. male with a hx of PAD and ongoing tobacco use who is being seen today for the evaluation of preoperative cardiac assessment at the request of Dr Arbie Cookey.  History of Present Illness:   Mr. Mccarley has no personal history of cardiac disease.  He denies any symptoms of chest pain, chest pressure, or shortness of breath.  He has a remote history of postural lightheadedness, but no recent problems.  He has never had frank syncope.  He denies heart palpitations, orthopnea, or PND.  The patient is now hospitalized for severe peripheral arterial disease with dry gangrene of the right leg and severe rest pain.  He has been found to have total occlusion of the abdominal aorta extending into both iliofemoral vessels.  He has been evaluated by vascular surgery with plans for aortobifemoral bypass.  Cardiology consultation is requested for preoperative assessment.  The patient has significant medical comorbidities including insulin-dependent diabetes, blindness, heavy tobacco abuse, and hypertension.     Past Medical History:  Diagnosis Date  . Chicken pox   . Claudication (HCC)   . Diabetes 1.5, managed as type 1 (HCC)   . Hypertension   . Low back pain   . Tobacco abuse     Past Surgical History:  Procedure Laterality Date  . CATARACT EXTRACTION Bilateral   . INTRAOCULAR LENS INSERTION Bilateral   . RETINAL DETACHMENT SURGERY Bilateral      Home Medications:  Prior to Admission medications   Medication Sig Start Date End Date Taking? Authorizing Provider  acetaminophen (TYLENOL) 500 MG tablet Take 1,000 mg by mouth every 6 (six) hours as needed for mild pain  or headache.   Yes [provider]  alprazolam Prudy Feeler) 2 MG tablet take 1 tablet by mouth twice a day if needed for anxiety Patient taking differently: Take 1 mg by mouth 3 (three) times daily as needed for anxiety. 11/21/16  Yes Reather Littler, MD  Continuous Blood Gluc Sensor (FREESTYLE LIBRE 14 DAY SENSOR) MISC 1 Units by Does not apply route every 14 (fourteen) days. 04/28/20  Yes Reather Littler, MD  gabapentin (NEURONTIN) 300 MG capsule Take 1 capsule (300 mg total) by mouth 3 (three) times daily. 07/21/20  Yes Reather Littler, MD  insulin glargine (LANTUS SOLOSTAR) 100 UNIT/ML Solostar Pen INJECT 30 UNITS SUBCUTANEOUSLY IN THE MORNING AND 32 UNITS IN THE EVENING. Patient taking differently: Inject 32 Units into the skin daily. Injects 32 units into the skin every morning; injects extra 6-8 units at bedtime as needed when BG>200 mg/dl 1/77/93  Yes Reather Littler, MD  NOVOLOG FLEXPEN 100 UNIT/ML FlexPen INJECT 6 TO 10 UNITS BEFORE MEALS AS DIRECTED  (PEN EXPIRES 28 DAYS AFTER OPENED) Patient taking differently: Inject 4-6 Units into the skin 3 (three) times daily with meals. 03/12/20  Yes Reather Littler, MD  omeprazole (PRILOSEC) 20 MG capsule Take 20 mg by mouth daily. 08/26/20  Yes [provider]  oxyCODONE (ROXICODONE) 15 MG immediate release tablet Take 15 mg by mouth every 8 (eight) hours as needed for pain. 02/24/13  Yes [provider]  ramipril (ALTACE) 5 MG capsule TAKE 1 CAPSULE EVERY DAY Patient taking differently: Take  5 mg by mouth daily. 06/04/20  Yes Reather Littler, MD  rosuvastatin (CRESTOR) 5 MG tablet Take 1 tablet (5 mg total) by mouth daily. 04/17/19  Yes Reather Littler, MD  DROPLET PEN NEEDLES 31G X 8 MM MISC USE TO INJECT  INSULIN FIVE TIMES DAILY 06/04/20   Reather Littler, MD  Lancets [facility] ULTRASOFT) lancets Use as instructed to check blood sugars 7 times per day 09/19/13   Reather Littler, MD    Inpatient Medications: Scheduled Meds: . gabapentin  300 mg Oral TID  .  heparin  5,000 Units Subcutaneous Q8H  . insulin aspart  0-5 Units Subcutaneous QHS  . insulin aspart  0-9 Units Subcutaneous TID WC  . [START ON 09/09/2020] insulin glargine  25 Units Subcutaneous Q breakfast  . nicotine  21 mg Transdermal Daily  . ramipril  5 mg Oral Daily  . [START ON 09/09/2020] senna  1 tablet Oral Daily   Continuous Infusions: . [START ON 09/09/2020] cefTRIAXone (ROCEPHIN)  IV     PRN Meds: acetaminophen **OR** acetaminophen, ALPRAZolam, morphine injection, oxyCODONE-acetaminophen  Allergies:    Allergies  Allergen Reactions  . Pravastatin Sodium Other (See Comments)    myalgia    Social History:   Social History   Socioeconomic History  . Marital status: Single    Spouse name: Not on file  . Number of children: Not on file  . Years of education: Not on file  . Highest education level: Not on file  Occupational History  . Not on file  Tobacco Use  . Smoking status: Current Every Day Smoker    Packs/day: 0.75    Years: 42.00    Pack years: 31.50    Types: Cigarettes  . Smokeless tobacco: Never Used  Substance and Sexual Activity  . Alcohol use: No    Alcohol/week: 0.0 standard drinks  . Drug use: Not on file  . Sexual activity: Not on file  Other Topics Concern  . Not on file  Social History Narrative  . Not on file   Social Determinants of Health   Financial Resource Strain: Not on file  Food Insecurity: Not on file  Transportation Needs: Not on file  Physical Activity: Not on file  Stress: Not on file  Social Connections: Not on file  Intimate Partner Violence: Not on file    Family History:    Family History  Problem Relation Age of Onset  . Diabetes Mother   . Atrial fibrillation Mother   . Heart attack Father   . Stroke Father   . Arthritis Father   . Heart disease Father   . Diabetes Maternal Uncle   . Diabetes Maternal Grandmother   . Heart attack Maternal Grandmother   . Heart disease Maternal Grandmother   . Esophageal  cancer Maternal Grandfather   . Heart attack Sister        at age 90     ROS:  Please see the history of present illness.  All other ROS reviewed and negative.     Physical Exam/Data:   Vitals:   09/08/20 0900 09/08/20 1000 09/08/20 1100 09/08/20 1441  BP: (!) 162/60 (!) 146/54 (!) 151/81 (!) 153/59  Pulse: 68 72 71 68  Resp: 13 15 17 18   Temp:    98.2 F (36.8 C)  TempSrc:    Oral  SpO2: 94% 96% 97% 99%  Weight:      Height:        Intake/Output Summary (Last 24 hours) at  09/08/2020 1623 Last data filed at 09/08/2020 8546 Gross per 24 hour  Intake 1100 ml  Output --  Net 1100 ml   Last 3 Weights 09/08/2020 08/10/2020 07/21/2020  Weight (lbs) 132 lb 0.9 oz 132 lb 132 lb  Weight (kg) 59.9 kg 59.875 kg 59.875 kg     Body mass index is 18.42 kg/m.  General: Thin male, appears older than his stated age, in no acute distress HEENT: normal Lymph: no adenopathy Neck: no JVD Endocrine:  No thryomegaly Vascular: No carotid bruits; FA pulses nonpalpable Cardiac:  normal S1, S2; RRR; no murmur  Lungs:  clear to auscultation bilaterally, no wheezing, rhonchi or rales  Abd: soft, nontender, no hepatomegaly  Ext: Mild bilateral edema with erythema of the right pretibial area.  Wounds are not exposed on my examination. Musculoskeletal:  No deformities, BUE and BLE strength normal and equal Skin: warm and dry  Neuro:  CNs 2-12 intact, no focal abnormalities noted Psych:  Normal affect   EKG:  The EKG was personally reviewed and demonstrates: Normal sinus rhythm, possible age-indeterminate anterior infarct, nonspecific T wave abnormality   Relevant CV Studies: Pending  Laboratory Data:  High Sensitivity Troponin:  No results for input(s): TROPONINIHS in the last 720 hours.   Chemistry Recent Labs  Lab 09/07/20 1756 09/08/20 1323  NA 125* 128*  K 4.9 4.1  CL 87* 92*  CO2 27 27  GLUCOSE 289* 206*  BUN 6* <5*  CREATININE 0.75 0.59*  CALCIUM 8.9 8.3*  GFRNONAA >60 >60   ANIONGAP 11 9    Recent Labs  Lab 09/07/20 1756  PROT 6.9  ALBUMIN 2.9*  AST 17  ALT 10  ALKPHOS 112  BILITOT 1.0   Hematology Recent Labs  Lab 09/07/20 1756  WBC 10.0  RBC 4.47  HGB 13.0  HCT 37.7*  MCV 84.3  MCH 29.1  MCHC 34.5  RDW 13.5  PLT 239   BNPNo results for input(s): BNP, PROBNP in the last 168 hours.  DDimer No results for input(s): DDIMER in the last 168 hours.   Radiology/Studies:  DG Ankle Complete Right  Result Date: 09/07/2020 CLINICAL DATA:  Ischemic ulcers. Patient reports foot injury 12 weeks ago. Pain in right heel. Lateral ankle pain EXAM: RIGHT ANKLE - COMPLETE 3+ VIEW COMPARISON:  None. FINDINGS: There is no evidence of fracture, dislocation, or joint effusion. Ankle mortise is preserved. No erosion, periosteal reaction, or bony destruction. Vascular calcifications. Skin defect about the plantar calcaneus. Suggestion of skin defect about the lateral aspect of the distal fibula. No tracking soft tissue air. No radiopaque foreign body. IMPRESSION: 1. Skin defect about the plantar calcaneus and probable skin defect about the lateral aspect of the distal ankle. No tracking soft tissue air or radiopaque foreign body. 2. No evidence of osteomyelitis. 3. Vascular calcifications. Electronically Signed   By: Narda Rutherford M.D.   On: 09/07/2020 18:30   CT Angio Aortobifemoral W and/or Wo Contrast  Result Date: 09/08/2020 CLINICAL DATA:  Claudication/leg ischemia Right foot pain and discoloration EXAM: CT ANGIOGRAPHY OF ABDOMINAL AORTA WITH ILIOFEMORAL RUNOFF TECHNIQUE: Multidetector CT imaging of the abdomen, pelvis and lower extremities was performed using the standard protocol during bolus administration of intravenous contrast. Multiplanar CT image reconstructions and MIPs were obtained to evaluate the vascular anatomy. CONTRAST:  OMNIPAQUE IOHEXOL 350 MG/ML SOLN COMPARISON:  None. FINDINGS: VASCULAR Aorta: The infrarenal abdominal aorta is occluded.  Suprarenal abdominal aorta is patent with scattered atherosclerotic plaque. No aneurysm. No dissection  of the suprarenal abdominal aorta. Celiac: Mild stenosis at the origin. Otherwise patent. There is moderate to severe stenosis at the origin of the common hepatic artery. The right hepatic artery is replaced from the superior mesenteric. SMA: Patent without evidence of aneurysm, dissection, vasculitis or significant stenosis. Renals: Mild stenosis at the origin of both renal arteries secondary to calcified plaque. IMA: Occluded origin and proximal segment. Opacification of distal segments indicative of retrograde collateral flow. RIGHT Lower Extremity Inflow: Common and external iliac arteries are occluded. Proximal left internal iliac artery is occluded. Opacification of distal branches indicative of retrograde collateral flow. Outflow: Flow reconstitutes within the right common femoral artery which is diffusely narrowed due to calcified plaque. Mild narrowing at the origin of the profunda femorals artery which is otherwise patent. The proximal 5 cm of the SFA is patent but diffusely diseased. The remainder of the right superficial femoral artery is occluded. Flow reconstitutes within the right popliteal artery which is diminutive but patent. Runoff: Tibioperoneal trunk is patent. There is 2 vessel runoff to the right ankle through the anterior tibial and peroneal arteries. Posterior tibial artery is occluded beyond the mid lower leg. LEFT Lower Extremity Inflow: left common and external iliac arteries are occluded. The left common iliac artery appears severely narrowed. Proximal internal iliac artery is occluded. Opacification of distal branches indicative retrograde collateral flow. Outflow: Flow reconstitutes within the left common femoral artery. Scattered atheromatous plaque without high-grade stenosis of the left common femoral artery. Profundus femorals artery is patent without significant narrowing. There  is severe stenosis of the origin of the left superficial femoral artery. The distal half of the SFA is heavily diseased with multiple foci of near complete occlusion. Pelvic tail artery is diminutive but patent. Runoff: Severe stenosis of the distal tibioperoneal trunk due to calcified plaque. Severe stenosis at the origin of the anterior tibial artery which is otherwise patent. There is three-vessel runoff to the left ankle. Veins: No obvious venous abnormality within the limitations of this arterial phase study. Review of the MIP images confirms the above findings. NON-VASCULAR Lower chest: Minimal bibasilar atelectasis. Mild emphysematous changes seen at the lung bases. Advanced coronary artery calcifications. Heart size within normal limits. Hepatobiliary: No focal hepatic lesion. No bile duct dilatation. Minimal cholelithiasis. Gallbladder otherwise unremarkable. Pancreas: No significant abnormality. Evaluation limited due to lack of intraperitoneal fat and arterial phase imaging. Spleen: Normal in size without focal abnormality. Adrenals/Urinary Tract: 11 mm right adrenal nodule is unchanged in size compared to chest CT from 04/29/2020. The internal density of the lesion on the chest CT is consistent with adenoma. The adrenal glands are thickened is indicative of hyperplasia. Kidneys, ureters, and bladder are normal. Stomach/Bowel: Stomach is within normal limits. Appendix appears normal. No evidence of bowel wall thickening, distention, or inflammatory changes. Lymphatic: No significant vascular findings are present. No enlarged abdominal or pelvic lymph nodes. Reproductive: Mildly enlarged prostate. Other: No abdominal wall hernia or abnormality. No abdominopelvic ascites. Musculoskeletal: Deformity of the sternum consistent with prior healed fracture. Otherwise no acute abnormality. IMPRESSION: VASCULAR 1. 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 Flow  reconstitutes in the common femoral arteries from prominent inferior epigastric and deep circumflex iliac artery branches. High-grade stenosis noted at the junction of the inferior epigastric arteries and common femoral arteries bilaterally. 2. Right superficial femoral artery is occluded with exception of the proximal 5 cm. 3. Heavily diseased left superficial femoral artery with multiple foci of near complete occlusion in  the distal half. NON-VASCULAR No acute abnormality of the abdomen or pelvis. Electronically Signed   By: Acquanetta Belling M.D.   On: 09/08/2020 08:56   DG Foot 2 Views Right  Result Date: 09/07/2020 CLINICAL DATA:  Ischemic ulcers. Patient reports foot injury 12 weeks ago. Pain in right heel. Lateral ankle pain. EXAM: RIGHT FOOT - 2 VIEW COMPARISON:  None. FINDINGS: There is no evidence of fracture or dislocation. Normal alignment and joint spaces. There is no erosion, bony destruction or periosteal reaction. Vascular calcifications. Skin defect noted at the plantar aspect of the calcaneus. No soft tissue air. No radiopaque foreign body. IMPRESSION: 1. Skin defect at the plantar aspect of the calcaneus consistent with ulcer. No radiographic findings of osteomyelitis. 2. Vascular calcifications. Electronically Signed   By: Narda Rutherford M.D.   On: 09/07/2020 18:29     Assessment and Plan:   1. Peripheral arterial disease with ischemic rest pain and gangrene 2. Hypertension, treated with an ACE inhibitor 3. Insulin-dependent diabetes 4. Tobacco abuse  The patient does not appear to have any active cardiac issues.  His EKG is abnormal but nondiagnostic.  He has no clinical signs or symptoms of angina, congestive heart failure, or cardiac arrhythmia.  While he has significant risk of CAD, he requires urgent surgery due to gangrene and tissue loss.  I will check an echocardiogram to make sure that he does not have significant left ventricular dysfunction.  As long as his echocardiogram  has no high risk features, especially considering his lack of cardiac symptoms and necessity/urgency of surgery, I think he can proceed with surgery at appropriately low cardiac risk of complication.  Risk Assessment/Risk Scores:                For questions or updates, please contact [facility] Please consult [email or web address] for contact info under    Signed, Tonny Bollman, MD  09/08/2020 4:23 PM

## 2020-09-08 NOTE — ED Notes (Signed)
Pt in CT.

## 2020-09-08 NOTE — ED Notes (Signed)
Dinner Tray Ordered @ 1651. 

## 2020-09-08 NOTE — ED Notes (Signed)
CBG 198 

## 2020-09-08 NOTE — Consult Note (Signed)
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 is a 60 y.o. male who presents with dry gangrene to the right lower extremity.  Patient was initially seen in my office 4 weeks ago and had a small superficial gangrenous ulcer to his heel.  Patient had monophasic pulses at the ankle and urgent referral was made to cardiology.  Patient reports progressive pain and presented to the emergency room with rapidly progressive gangrenous changes to the right lower extremity.  Past Medical History:  Diagnosis Date  . Chicken pox   . Claudication (HCC)   . Diabetes 1.5, managed as type 1 (HCC)   . Hypertension   . Low back pain   . Tobacco abuse    Past Surgical History:  Procedure Laterality Date  . CATARACT EXTRACTION Bilateral   . INTRAOCULAR LENS INSERTION Bilateral   . RETINAL DETACHMENT SURGERY Bilateral    Social History   Socioeconomic History  . Marital status: Single    Spouse name: Not on file  . Number of children: Not on file  . Years of education: Not on file  . Highest education level: Not on file  Occupational History  . Not on file  Tobacco Use  . Smoking status: Current Every Day Smoker    Packs/day: 0.75    Years: 42.00    Pack years: 31.50    Types: Cigarettes  . Smokeless tobacco: Never Used  Substance and Sexual Activity  . Alcohol use: No    Alcohol/week: 0.0 standard drinks  . Drug use: Not on file  . Sexual activity: Not on file  Other Topics Concern  . Not on file  Social History Narrative  . Not on file   Social Determinants of Health   Financial Resource Strain: Not on file  Food Insecurity: Not on file  Transportation Needs: Not on file  Physical Activity: Not on file  Stress: Not on file  Social Connections: Not on file   Family History  Problem Relation Age of Onset  . Diabetes Mother   . Atrial fibrillation  Mother   . Heart attack Father   . Stroke Father   . Arthritis Father   . Heart disease Father   . Diabetes Maternal Uncle   . Diabetes Maternal Grandmother   . Heart attack Maternal Grandmother   . Heart disease Maternal Grandmother   . Esophageal cancer Maternal Grandfather   . Heart attack Sister        at age 53   - negative except otherwise stated in the family history section Allergies  Allergen Reactions  . Pravastatin Sodium     myalgia   Prior to Admission medications   Medication Sig Start Date End Date Taking? Authorizing Provider  alprazolam Prudy Feeler) 2 MG tablet take 1 tablet by mouth twice a day if needed for anxiety Patient taking differently: take 1/2 tablet (1 mg ) by mouth three times a day if needed for anxiety 11/21/16   Reather Littler, MD  Continuous Blood Gluc Sensor (FREESTYLE LIBRE 14 DAY SENSOR) MISC 1 Units by Does not apply route every 14 (fourteen) days. 04/28/20   Reather Littler, MD  DROPLET PEN NEEDLES 31G X 8 MM MISC USE TO INJECT  INSULIN FIVE TIMES DAILY 06/04/20   Reather Littler, MD  gabapentin (NEURONTIN) 300 MG capsule Take 1 capsule (300 mg total) by mouth 3 (three) times daily. 07/21/20  Reather Littler, MD  ibuprofen (ADVIL,MOTRIN) 200 MG tablet Take 200 mg by mouth every 6 (six) hours as needed.    [provider]  insulin glargine (LANTUS SOLOSTAR) 100 UNIT/ML Solostar Pen INJECT 30 UNITS SUBCUTANEOUSLY IN THE MORNING AND 32 UNITS IN THE EVENING. Patient taking differently: INJECT 34 UNITS SUBCUTANEOUSLY IN THE MORNING AND 32 UNITS IN THE EVENING. 04/22/20   Reather Littler, MD  Lancets [facility] ULTRASOFT) lancets Use as instructed to check blood sugars 7 times per day 09/19/13   Reather Littler, MD  NOVOLOG FLEXPEN 100 UNIT/ML FlexPen INJECT 6 TO 10 UNITS BEFORE MEALS AS DIRECTED  (PEN EXPIRES 28 DAYS AFTER OPENED) 03/12/20   Reather Littler, MD  oxycodone (ROXICODONE) 30 MG immediate release tablet 15 mg every 4 (four) hours as needed. 02/24/13   [provider]  ramipril (ALTACE) 5 MG capsule TAKE 1 CAPSULE EVERY DAY 06/04/20   Reather Littler, MD  rosuvastatin (CRESTOR) 5 MG tablet Take 1 tablet (5 mg total) by mouth daily. 04/17/19   Reather Littler, MD  triamcinolone cream (KENALOG) 0.1 % Apply topically 2 (two) times daily. 12/29/15   Lorre Munroe, NP   DG Ankle Complete Right  Result Date: 09/07/2020 CLINICAL DATA:  Ischemic ulcers. Patient reports foot injury 12 weeks ago. Pain in right heel. Lateral ankle pain EXAM: RIGHT ANKLE - COMPLETE 3+ VIEW COMPARISON:  None. FINDINGS: There is no evidence of fracture, dislocation, or joint effusion. Ankle mortise is preserved. No erosion, periosteal reaction, or bony destruction. Vascular calcifications. Skin defect about the plantar calcaneus. Suggestion of skin defect about the lateral aspect of the distal fibula. No tracking soft tissue air. No radiopaque foreign body. IMPRESSION: 1. Skin defect about the plantar calcaneus and probable skin defect about the lateral aspect of the distal ankle. No tracking soft tissue air or radiopaque foreign body. 2. No evidence of osteomyelitis. 3. Vascular calcifications. Electronically Signed   By: Narda Rutherford M.D.   On: 09/07/2020 18:30   DG Foot 2 Views Right  Result Date: 09/07/2020 CLINICAL DATA:  Ischemic ulcers. Patient reports foot injury 12 weeks ago. Pain in right heel. Lateral ankle pain. EXAM: RIGHT FOOT - 2 VIEW COMPARISON:  None. FINDINGS: There is no evidence of fracture or dislocation. Normal alignment and joint spaces. There is no erosion, bony destruction or periosteal reaction. Vascular calcifications. Skin defect noted at the plantar aspect of the calcaneus. No soft tissue air. No radiopaque foreign body. IMPRESSION: 1. Skin defect at the plantar aspect of the calcaneus consistent with ulcer. No radiographic findings of osteomyelitis. 2. Vascular calcifications. Electronically Signed   By: Narda Rutherford M.D.   On: 09/07/2020 18:29   -  pertinent xrays, CT, MRI studies were reviewed and independently interpreted  Positive ROS: All other systems have been reviewed and were otherwise negative with the exception of those mentioned in the HPI and as above.  Physical Exam: General: Alert, no acute distress Psychiatric: Patient is competent for consent with normal mood and affect Lymphatic: No axillary or cervical lymphadenopathy Cardiovascular: No pedal edema Respiratory: No cyanosis, no use of accessory musculature GI: No organomegaly, abdomen is soft and non-tender    Images:  @ENCIMAGES @  Labs:  Lab Results  Component Value Date   HGBA1C 8.8 (A) 07/21/2020   HGBA1C 9.3 (A) 04/28/2020   HGBA1C 8.7 (A) 01/21/2020    Lab Results  Component Value Date   ALBUMIN 2.9 (L) 09/07/2020   ALBUMIN 3.6 07/21/2020   ALBUMIN 4.0  04/28/2020    Neurologic: Patient does not have protective sensation bilateral lower extremities.   MUSCULOSKELETAL:   Skin: Examination patient has rapidly progressive gangrene to the right lower extremity with small petechial gangrenous changes in the calf and large gangrenous ulcers to the heel and hindfoot.  4 weeks ago patient only had a superficial small gangrenous ulcer to the heel.  I cannot palpate a femoral or popliteal pulse.  Assessment: Rapidly progressive gangrenous changes to the right lower extremity.  Plan: Patient scheduled for CT arteriogram of both lower extremities from the diaphragm distal.  Patient may have infra inguinal occlusion.  Patient may require vascular intervention.  I will plan for orthopedic intervention once vascular status is established.  Thank you for the consult and the opportunity to see Mr. Peter Marling, MD [facility] [phone] 7:36 AM

## 2020-09-08 NOTE — Telephone Encounter (Signed)
Patient's sister called, is concerned and wants to make sure she has all the info on what is going on.  She called hospital 2 hours ago and was told by the nurse that patient is on stretcher in the ED hallway.  ED Nurse is to call back but no one has.  She is aware that Dr Lajoyce Corners is to do surgery, she just wants to know details.  When surgery will be etc.  She was told patient could not take any belongings, so he does not have a cell phone or any way to communicate with her.  Can you all check on things/please call her and advise any more details?

## 2020-09-08 NOTE — Consult Note (Addendum)
Hospital Consult    Reason for Consult:  PVD with non healing wounds right lower extremity Requesting Physician:  Lajoyce Corners MRN #:  [id number]  History of Present Illness: This is a 60 y.o. male who is followed by Dr. Lajoyce Corners for rapidly progressive dry gangrene of RLE.  He was initially seen about a month ago by Dr. Lajoyce Corners and found to have a small superficial gangrenous ulcer to his heel.  Pt had monophasic pulses at the ankle and urgent referral was made to cardiology.  Pt started having progressive pain and presented to the ED with rapidly progressive changes to the RLE.  He was seen by Dr. Lajoyce Corners in the ER.  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  [facility] is consulted.  He states that in [place] November, he twisted his ankle, he was placed in a boot and then developed some ulcers after he had some swelling in his leg and these have progressively gotten worse.  He states that he does have pain at night that keeps him up.  He does not have any wounds on the left leg.    The pt is on a statin for cholesterol management.  The pt is not on a daily aspirin.   Other AC:  Sq heparin The pt is not on medication for hypertension.   The pt is diabetic.  [name] on insulin at home.  Tobacco hx:  current  Past Medical History:  Diagnosis Date  . Chicken pox   . Claudication (HCC)   . Diabetes 1.5, managed as type 1 (HCC)   . Hypertension   . Low back pain   . Tobacco abuse     Past Surgical History:  Procedure Laterality Date  . CATARACT EXTRACTION Bilateral   . INTRAOCULAR LENS INSERTION Bilateral   . RETINAL DETACHMENT SURGERY Bilateral     Allergies  Allergen Reactions  . Pravastatin Sodium     myalgia    Prior to Admission medications   Medication Sig Start Date End Date Taking? Authorizing Provider  alprazolam Prudy Feeler) 2 MG tablet take 1 tablet by mouth twice a day if needed for anxiety Patient taking differently: take 1/2 tablet (1 mg ) by mouth three times a day if  needed for anxiety 11/21/16   Reather Littler, MD  Continuous Blood Gluc Sensor (FREESTYLE LIBRE 14 DAY SENSOR) MISC 1 Units by Does not apply route every 14 (fourteen) days. 04/28/20   Reather Littler, MD  DROPLET PEN NEEDLES 31G X 8 MM MISC USE TO INJECT  INSULIN FIVE TIMES DAILY 06/04/20   Reather Littler, MD  gabapentin (NEURONTIN) 300 MG capsule Take 1 capsule (300 mg total) by mouth 3 (three) times daily. 07/21/20   Reather Littler, MD  ibuprofen (ADVIL,MOTRIN) 200 MG tablet Take 200 mg by mouth every 6 (six) hours as needed.    [provider]  insulin glargine (LANTUS SOLOSTAR) 100 UNIT/ML Solostar Pen INJECT 30 UNITS SUBCUTANEOUSLY IN THE MORNING AND 32 UNITS IN THE EVENING. Patient taking differently: INJECT 34 UNITS SUBCUTANEOUSLY IN THE MORNING AND 32 UNITS IN THE EVENING. 04/22/20   Reather Littler, MD  Lancets [facility] ULTRASOFT) lancets Use as instructed to check blood sugars 7 times per day 09/19/13   Reather Littler, MD  NOVOLOG FLEXPEN 100 UNIT/ML FlexPen INJECT 6 TO 10 UNITS BEFORE MEALS AS DIRECTED  (PEN EXPIRES 28 DAYS AFTER OPENED) 03/12/20   Reather Littler, MD  oxycodone (ROXICODONE) 30 MG immediate release tablet 15  mg every 4 (four) hours as needed. 02/24/13   [provider]  ramipril (ALTACE) 5 MG capsule TAKE 1 CAPSULE EVERY DAY 06/04/20   Reather Littler, MD  rosuvastatin (CRESTOR) 5 MG tablet Take 1 tablet (5 mg total) by mouth daily. 04/17/19   Reather Littler, MD  triamcinolone cream (KENALOG) 0.1 % Apply topically 2 (two) times daily. 12/29/15   Lorre Munroe, NP    Social History   Socioeconomic History  . Marital status: Single    Spouse name: Not on file  . Number of children: Not on file  . Years of education: Not on file  . Highest education level: Not on file  Occupational History  . Not on file  Tobacco Use  . Smoking status: Current Every Day Smoker    Packs/day: 0.75    Years: 42.00    Pack years: 31.50    Types: Cigarettes  . Smokeless tobacco: Never Used  Substance  and Sexual Activity  . Alcohol use: No    Alcohol/week: 0.0 standard drinks  . Drug use: Not on file  . Sexual activity: Not on file  Other Topics Concern  . Not on file  Social History Narrative  . Not on file   Social Determinants of Health   Financial Resource Strain: Not on file  Food Insecurity: Not on file  Transportation Needs: Not on file  Physical Activity: Not on file  Stress: Not on file  Social Connections: Not on file  Intimate Partner Violence: Not on file     Family History  Problem Relation Age of Onset  . Diabetes Mother   . Atrial fibrillation Mother   . Heart attack Father   . Stroke Father   . Arthritis Father   . Heart disease Father   . Diabetes Maternal Uncle   . Diabetes Maternal Grandmother   . Heart attack Maternal Grandmother   . Heart disease Maternal Grandmother   . Esophageal cancer Maternal Grandfather   . Heart attack Sister        at age 49    ROS: [x]  Positive   [ ]  Negative   [ ]  All sytems reviewed and are negative  Cardiac: []  chest pain/pressure []  dyspnea on exertion  Vascular: [x]  pain in legs while walking []  pain in legs at rest []  pain in legs at night [x]  non-healing ulcers []  hx of DVT []  swelling in legs  Pulmonary: []  asthma/wheezing []  home O2  Neurologic: []  hx of CVA []  mini stroke   Hematologic: []  hx of cancer  Endocrine:   [x]  diabetes []  thyroid disease  GI []  GERD  GU: []  CKD/renal failure []  HD--[]  M/W/F or []  T/T/S  Psychiatric: []  anxiety []  depression  Musculoskeletal: []  arthritis []  joint pain  Integumentary: [x]   ulcers  Constitutional: []  fever  []  chills  Physical Examination  Vitals:   09/08/20 1000 09/08/20 1100  BP: (!) 146/54 (!) 151/81  Pulse: 72 71  Resp: 15 17  Temp:    SpO2: 96% 97%   Body mass index is 18.42 kg/m.  General:  WDWN in NAD Gait: Not observed HENT: WNL, normocephalic Pulmonary: normal non-labored breathing Cardiac:  regular Abdomen:  soft, NT/ND Skin: without rashes Vascular Exam/Pulses:  Right Left  Radial 2+ (normal) 2+ (normal)  DP Not palpable Not palpable  PT Not palpable Not palpable   Extremities: RLE with full thickness tissue loss around the heel and medial ankle area as well as other ulcers proximally  Musculoskeletal: no muscle wasting or atrophy  Neurologic: A&O X 3; speech is fluent/normal Psychiatric:  The pt has Normal affect.   CBC    Component Value Date/Time   WBC 10.0 09/07/2020 1756   RBC 4.47 09/07/2020 1756   HGB 13.0 09/07/2020 1756   HCT 37.7 (L) 09/07/2020 1756   PLT 239 09/07/2020 1756   MCV 84.3 09/07/2020 1756   MCH 29.1 09/07/2020 1756   MCHC 34.5 09/07/2020 1756   RDW 13.5 09/07/2020 1756   LYMPHSABS 0.7 09/07/2020 1756   MONOABS 0.6 09/07/2020 1756   EOSABS 0.1 09/07/2020 1756   BASOSABS 0.0 09/07/2020 1756    BMET    Component Value Date/Time   NA 125 (L) 09/07/2020 1756   K 4.9 09/07/2020 1756   CL 87 (L) 09/07/2020 1756   CO2 27 09/07/2020 1756   GLUCOSE 289 (H) 09/07/2020 1756   BUN 6 (L) 09/07/2020 1756   CREATININE 0.75 09/07/2020 1756   CALCIUM 8.9 09/07/2020 1756   GFRNONAA >60 09/07/2020 1756    COAGS: No results found for: INR, PROTIME   Non-Invasive Vascular Imaging:   CTA on 09/08/2020: IMPRESSION: VASCULAR  1. 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 Flow reconstitutes in the common femoral arteries from prominent inferior epigastric and deep circumflex iliac artery branches. High-grade stenosis noted at the junction of the inferior epigastric arteries and common femoral arteries bilaterally. 2. Right superficial femoral artery is occluded with exception of the proximal 5 cm. 3. Heavily diseased left superficial femoral artery with multiple foci of near complete occlusion in the distal half.  NON-VASCULAR  No acute abnormality of the abdomen or  pelvis   ASSESSMENT/PLAN: This is a 60 y.o. 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   -pt seen with Dr. Arbie Cookey.  He discussed proceeding with aortobifemoral bypass grafting as well as right femoral to above knee bypass to improve blood flow to the give him the best chance to heal these wounds.  Dr. Arbie Cookey discussed with pt even with improving blood flow, there is small chance of these healing.  If they do not improve, he would need a below knee amputation.  Dr. Arbie Cookey also discussed with pt that we could proceed only with above knee amputation but rehabilitation is dramatically different with above knee vs below knee amputation.  Pt expresses understanding and wishes to proceed with aortobifemoral bypass graft with right leg bypass.  This will also improve blood flow to the left leg given the extent of his PVD.    -pt will need medication for pain control.  Will order Morphine/Percocet.  Dilaudid and Vicodin discontinued.  -pt being admitted to [facility].  He does have chronic hyponatremia that will also need to be addressed.   -pt will need to have cardiology clearance prior to surgery.  They are aware and will see pt.   -plan for surgery on Friday by Dr. Anne Hahn, PA-C [facility] [phone]   I have examined the patient, reviewed and agree with above.  Very difficult situation.  Patient with history of right foot pain.  Initially had minimal tissue loss.  Presents now with a much more extensive tissue loss.  Initially had ankle strain.  Has severe bilateral lower extremity ischemia.  CT angiogram reveals complete occlusion of his aorta just below the level of the renal arteries with occlusion of the iliac vessels  bilaterally.  He has reconstitution of a relatively normal caliber common femoral artery bilaterally.  He has occlusion of his superficial femoral arteries in the mid thigh  bilaterally.  He has normal flow from the popliteal arteries distally into the tibial vessels.  Unfortunately he has extensive full-thickness gangrenous changes in his right foot.  His medial malleolus and heel are both involved.  Had a very long discussion with the patient.  With his degree of tissue loss, I feel that he is at high risk for amputation even with revascularization.  [name] quite functional otherwise.  I do feel that [place] with revascularization he may demarcate these wounds with time and heal.  I explained that the other option would be above-knee amputation.  I explained the option of aortobifemoral bypass along with right femoral to popliteal bypass for normalization of flow to his right foot and then wound care to follow.  I explained the magnitude of aortofemoral bypass and the expected hospitalization.  He understands and wishes to proceed with aortic surgery.  He will be admitted to hospital for optimization of his diabetic control and also hyponatremia.  We have asked cardiology to see him for optimization prior to aortic surgery.  We will plan surgery for Friday September 11, 2020  Gretta Began, MD 09/08/2020 11:58 PM

## 2020-09-08 NOTE — H&P (Signed)
Date: 09/08/2020               Patient Name:  Peter Mendez. MRN: [id number]  DOB: [birth year] Age / Sex: 60 y.o., male   PCP: Leilani Able, MD         Medical Service: Internal Medicine Teaching Service         Attending Physician: Dr. Clarene Duke, Ambrose Finland, MD    First Contact: Dr. Marijo Conception Pager: [phone]  Second Contact: Dr. 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 He states he twisted his ankle over a month ago and was given a boot to wear by podiatry. Shortly before this he had noticed a blood blister on his right 1st toe. The boot seemed to rub against his ankle and formed a sore and soon after he begin to notice sores forming on other parts of the leg. The pain is 8/10. He has chronic neuropathy but can sometimes feel his feet. His left foot does not currently hurt. He did use a heating pad on his right foot shortly after falling and this burned his left leg, which still has not healed.  After the ulcer on his ankle formed he went to orthopedics and was instructed to follow-up with vascular but was unable to get an appointment until late February. He returned to orthopedics yesterday and was sent to the ER.  He denies fever and has checked temperature at home, chills, nausea, weight loss, decreased appetite, dizziness, shortness of breath, other pain, diarrhea or constipation.  [name] very nearly blind, he thinks from his diabetes and can see the shape of some things but can not recognize a person.   ED Course: Vitals were within normal limits except for intermittently elevated blood pressure. Na was 125, corrected  to 130 for glucose 289, and albumin 2.9. Orthopedics was consulted and CT angio was ordered which showed infrarenal aortic stenosis, bilateral common and external iliac arteries and right and left superficial femoral arteries. Vascular was consulted. He was given 1L of NS and 2g ceftriaxone.   Social:   He lives at home with his mother, who helps him as he is blind He smokes 1ppd for 25 years  He does not drink alcohol or use drugs recreationally   Family History:   Family History  Problem Relation Age of Onset  . Diabetes Mother   . Atrial fibrillation Mother   . Heart attack Father   . Stroke Father   . Arthritis Father   . Heart disease Father   . Diabetes Maternal Uncle   . Diabetes Maternal Grandmother   . Heart attack Maternal Grandmother   . Heart disease Maternal Grandmother   . Esophageal cancer Maternal Grandfather   . Heart attack Sister        at age 35     Meds:  No outpatient medications have been marked as taking for the 09/07/20 encounter [facility] Encounter).     Allergies: Allergies as of 09/07/2020 - Review Complete 09/07/2020  Allergen Reaction Noted  . Pravastatin sodium  05/09/2014   Past Medical History:  Diagnosis Date  . Chicken  pox   . Claudication (HCC)   . Diabetes 1.5, managed as type 1 (HCC)   . Hypertension   . Low back pain   . Tobacco abuse      Review of Systems: A complete ROS was negative except as per HPI.   Physical Exam: Blood pressure (!) 146/54, pulse 72, temperature 98.5 F (36.9 C), temperature source Oral, resp. rate 15, height 5\' 11"  (1.803 m), weight 59.9 kg, SpO2 96 %.  Constitution: thin appearing, NAD  HENT: [place]/AT  Eyes: no icterus or injection  Cardio: RRR, no m/r/g, no LE edema, no palpable pedal pulses bilaterally, strong left posterior tibal pulse, strong radial pulse Respiratory: clear to auscultation bilaterally  Abdominal: soft, non-distended, NTTP, +BS  MSK: moving all extremities; no sensation  bilateral feet up to ankle  Neuro: alert and oriented, normal affect  Skin:  LLE: 1cm circumferential scab RLE: multiple dry eschars on the heal, medial ankle, and foot extending up the calf, warmth and erythema to mid calf, no puruelence, fluctuance or crepitus           EKG: personally reviewed my interpretation is - baseline ECG ordered   Right DG Ankle and Foot: 1. Skin defect about the plantar calcaneus and probable skin defect about the lateral aspect of the distal ankle. No tracking soft tissue air or radiopaque foreign body. 1. Skin defect at the plantar aspect of the calcaneus consistent with ulcer. No radiographic findings of osteomyelitis. 2. No evidence of osteomyelitis. 3. Vascular calcifications. Electronically Signed   By: M.D.   On: 09/07/2020 18:30  CT Angio IMPRESSION: VASCULAR  1. 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 Flow reconstitutes in the common femoral arteries from prominent inferior epigastric and deep circumflex iliac artery branches. High-grade stenosis noted at the junction of the inferior epigastric arteries and common femoral arteries bilaterally. 2. Right superficial femoral artery is occluded with exception of the proximal 5 cm. 3. Heavily diseased left superficial femoral artery with multiple foci of near complete occlusion in the distal half.  Electronically Signed   By: 09/09/2020 M.D.   On: 09/08/2020 08:56  Assessment & Plan by Problem: Active Problems:   PVD (peripheral vascular disease) (HCC)  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   Arterial Disease with Infrarenal Aortic Occlusion, Bilateral Common and Femoral Occlusions  Dry Gangrene with Moderate Cellulitis  RLE Superficial Femoral Artery Occlusions - bilateral No systemic signs or symptoms. Worsening dry gangrenous ulcerations for the past month. Signs of cellulitis extending to lower calf without purulence, fluctuance or crepitus.   - continue ceftriaxone 2g qd  - MRSA pcr  - orthopedics and vascular consulted, appreciate recommendations - norco 5-325 q6h prn, dilaudid .5mg  q4h prn  - blood cultures pending  - CM diet for now  - will need PT/OT after definitive treatment   Latent Autoimmune Diabetes Endorses using lantus 32U qam and novolog 3-4U tid with meals   - SSI moderate and qhs ssi - 25 U lantus qam  Chronic Hyponatremia Chronic. Na 125 and 128 corrected to 130 and 131 for hyperglycemia, which appear to be around corrected baseline. Possibly 2/2 tea and toast diet. Patient is very thin with BMI 18 and albumin 2.9. Chart reviewed and there is no recent weight loss.   -  Ur Na and Urine Osms ordered   Tobacco Use Discussed risk factors for arterial disease with smoking. [name] considering trying to quit while admitted   - nicotine patch   HTN - cont. Home ramipril   Anxiety - cont. Xanax 1 mg tid prn    Diet: CM  VTE: lovenox  IVF: none Code: full  Dispo: Admit patient to Inpatient with expected length of stay greater than 2 midnights.  SignedVersie Starks, DO 09/08/2020, 10:21 AM  Pager: [phone]

## 2020-09-08 NOTE — ED Notes (Signed)
Dr. Early at bedside 

## 2020-09-09 ENCOUNTER — Inpatient Hospital Stay (HOSPITAL_COMMUNITY): Payer: Medicare Other

## 2020-09-09 ENCOUNTER — Encounter (HOSPITAL_COMMUNITY)
Admission: EM | Disposition: A | Discharge status: Home Health | Payer: Self-pay | Source: Home / Self Care | Attending: Internal Medicine

## 2020-09-09 DIAGNOSIS — E876 Hypokalemia: Secondary | ICD-10-CM

## 2020-09-09 DIAGNOSIS — Z794 Long term (current) use of insulin: Secondary | ICD-10-CM

## 2020-09-09 DIAGNOSIS — Z720 Tobacco use: Secondary | ICD-10-CM

## 2020-09-09 DIAGNOSIS — I743 Embolism and thrombosis of arteries of the lower extremities: Secondary | ICD-10-CM

## 2020-09-09 DIAGNOSIS — R9431 Abnormal electrocardiogram [ECG] [EKG]: Secondary | ICD-10-CM

## 2020-09-09 DIAGNOSIS — F419 Anxiety disorder, unspecified: Secondary | ICD-10-CM

## 2020-09-09 DIAGNOSIS — I700 Atherosclerosis of aorta: Secondary | ICD-10-CM

## 2020-09-09 DIAGNOSIS — I10 Essential (primary) hypertension: Secondary | ICD-10-CM

## 2020-09-09 DIAGNOSIS — I252 Old myocardial infarction: Secondary | ICD-10-CM | POA: Diagnosis not present

## 2020-09-09 DIAGNOSIS — L03115 Cellulitis of right lower limb: Secondary | ICD-10-CM

## 2020-09-09 DIAGNOSIS — E13649 Other specified diabetes mellitus with hypoglycemia without coma: Secondary | ICD-10-CM

## 2020-09-09 LAB — COMPREHENSIVE METABOLIC PANEL
ALT: 7 U/L (ref 0–44)
AST: 16 U/L (ref 15–41)
Albumin: 2.5 g/dL — ABNORMAL LOW (ref 3.5–5.0)
Alkaline Phosphatase: 89 U/L (ref 38–126)
Anion gap: 14 (ref 5–15)
BUN: 7 mg/dL — ABNORMAL LOW (ref 8–23)
CO2: 24 mmol/L (ref 22–32)
Calcium: 8.1 mg/dL — ABNORMAL LOW (ref 8.9–10.3)
Chloride: 95 mmol/L — ABNORMAL LOW (ref 98–111)
Creatinine, Ser: 0.65 mg/dL (ref 0.61–1.24)
GFR, Estimated: >60 mL/min (ref >60)
Glucose, Bld: 54 mg/dL — ABNORMAL LOW (ref 70–99)
Potassium: 3.4 mmol/L — ABNORMAL LOW (ref 3.5–5.1)
Sodium: 133 mmol/L — ABNORMAL LOW (ref 135–145)
Total Bilirubin: 0.7 mg/dL (ref 0.3–1.2)
Total Protein: 5.9 g/dL — ABNORMAL LOW (ref 6.5–8.1)

## 2020-09-09 LAB — CBC WITH DIFFERENTIAL/PLATELET
Abs Immature Granulocytes: 0.01 10*3/uL (ref 0.00–0.07)
Basophils Absolute: 0 10*3/uL (ref 0.0–0.1)
Basophils Relative: 0 %
Eosinophils Absolute: 0.2 10*3/uL (ref 0.0–0.5)
Eosinophils Relative: 4 %
HCT: 41 % (ref 39.0–52.0)
Hemoglobin: 13.2 g/dL (ref 13.0–17.0)
Immature Granulocytes: 0 %
Lymphocytes Relative: 26 %
Lymphs Abs: 1.4 10*3/uL (ref 0.7–4.0)
MCH: 28.1 pg (ref 26.0–34.0)
MCHC: 32.2 g/dL (ref 30.0–36.0)
MCV: 87.2 fL (ref 80.0–100.0)
Monocytes Absolute: 0.7 10*3/uL (ref 0.1–1.0)
Monocytes Relative: 12 %
Neutro Abs: 3.1 10*3/uL (ref 1.7–7.7)
Neutrophils Relative %: 58 %
Platelets: 197 10*3/uL (ref 150–400)
RBC: 4.7 MIL/uL (ref 4.22–5.81)
RDW: 13.5 % (ref 11.5–15.5)
WBC: 5.3 10*3/uL (ref 4.0–10.5)
nRBC: 0 % (ref 0.0–0.2)

## 2020-09-09 LAB — ECHOCARDIOGRAM COMPLETE
Area-P 1/2: 5.02 cm2
Height: 71 in
S' Lateral: 3.3 cm
Weight: 2112 oz

## 2020-09-09 LAB — OSMOLALITY, URINE: Osmolality, Ur: 457 mOsm/kg (ref 300–900)

## 2020-09-09 LAB — GLUCOSE, CAPILLARY
Glucose-Capillary: 232 mg/dL — ABNORMAL HIGH (ref 70–99)
Glucose-Capillary: 200 mg/dL — ABNORMAL HIGH (ref 70–99)
Glucose-Capillary: 95 mg/dL (ref 70–99)

## 2020-09-09 LAB — CBG MONITORING, ED: Glucose-Capillary: 52 mg/dL — ABNORMAL LOW (ref 70–99)

## 2020-09-09 LAB — MAGNESIUM: Magnesium: 1.8 mg/dL (ref 1.7–2.4)

## 2020-09-09 LAB — HIV ANTIBODY (ROUTINE TESTING W REFLEX): HIV Screen 4th Generation wRfx: NONREACTIVE

## 2020-09-09 LAB — SODIUM, URINE, RANDOM: Sodium, Ur: 120 mmol/L

## 2020-09-09 SURGERY — LOWER EXTREMITY ANGIOGRAPHY
Anesthesia: LOCAL

## 2020-09-09 MED ORDER — POTASSIUM CHLORIDE CRYS ER 20 MEQ PO TBCR
30.0000 meq | EXTENDED_RELEASE_TABLET | Freq: Once | ORAL | Status: AC
  Administered 2020-09-09: 30 meq via ORAL
  Filled 2020-09-09: qty 1

## 2020-09-09 MED ORDER — VANCOMYCIN HCL 750 MG/150ML IV SOLN
750.0000 mg | Freq: Two times a day (BID) | INTRAVENOUS | Status: DC
  Administered 2020-09-09 – 2020-09-13 (×7): 750 mg via INTRAVENOUS
  Filled 2020-09-09 (×8): qty 150

## 2020-09-09 MED ORDER — POTASSIUM CHLORIDE CRYS ER 20 MEQ PO TBCR
40.0000 meq | EXTENDED_RELEASE_TABLET | Freq: Once | ORAL | Status: AC
  Administered 2020-09-09: 40 meq via ORAL
  Filled 2020-09-09: qty 2

## 2020-09-09 MED ORDER — ACETAMINOPHEN 325 MG PO TABS
650.0000 mg | ORAL_TABLET | Freq: Two times a day (BID) | ORAL | Status: DC
  Administered 2020-09-09 – 2020-09-10 (×4): 650 mg via ORAL
  Filled 2020-09-09 (×5): qty 2

## 2020-09-09 MED ORDER — MUPIROCIN 2 % EX OINT
1.0000 "application " | TOPICAL_OINTMENT | Freq: Two times a day (BID) | CUTANEOUS | Status: AC
  Administered 2020-09-09 – 2020-09-13 (×9): 1 via NASAL
  Filled 2020-09-09 (×2): qty 22

## 2020-09-09 MED ORDER — POTASSIUM CHLORIDE 20 MEQ PO PACK: 40.0000 meq | PACK | Freq: Once | ORAL | Status: DC

## 2020-09-09 MED ORDER — VANCOMYCIN HCL 1250 MG/250ML IV SOLN
1250.0000 mg | Freq: Once | INTRAVENOUS | Status: AC
  Administered 2020-09-09: 1250 mg via INTRAVENOUS
  Filled 2020-09-09: qty 250

## 2020-09-09 MED ORDER — INSULIN GLARGINE 100 UNIT/ML ~~LOC~~ SOLN
18.0000 [IU] | Freq: Every day | SUBCUTANEOUS | Status: DC
  Administered 2020-09-09: 18 [IU] via SUBCUTANEOUS
  Filled 2020-09-09 (×2): qty 0.18

## 2020-09-09 MED ORDER — INSULIN ASPART 100 UNIT/ML ~~LOC~~ SOLN
0.0000 [IU] | Freq: Three times a day (TID) | SUBCUTANEOUS | Status: DC
  Administered 2020-09-09 – 2020-09-10 (×2): 2 [IU] via SUBCUTANEOUS
  Administered 2020-09-10: 1 [IU] via SUBCUTANEOUS
  Administered 2020-09-10 – 2020-09-12 (×3): 3 [IU] via SUBCUTANEOUS
  Administered 2020-09-12: 16:00:00 2 [IU] via SUBCUTANEOUS
  Administered 2020-09-12: 13:00:00 3 [IU] via SUBCUTANEOUS
  Administered 2020-09-14: 2 [IU] via SUBCUTANEOUS
  Administered 2020-09-15: 1 [IU] via SUBCUTANEOUS

## 2020-09-09 MED ORDER — CARVEDILOL 3.125 MG PO TABS: 3.1250 mg | ORAL_TABLET | Freq: Two times a day (BID) | ORAL | Status: DC

## 2020-09-09 MED ORDER — CHLORHEXIDINE GLUCONATE CLOTH 2 % EX PADS
6.0000 | MEDICATED_PAD | Freq: Every day | CUTANEOUS | Status: AC
  Administered 2020-09-10 – 2020-09-13 (×5): 6 via TOPICAL

## 2020-09-09 NOTE — Progress Notes (Signed)
  Echocardiogram 2D Echocardiogram has been performed.  [name] 09/09/2020, 3:46 PM

## 2020-09-09 NOTE — Progress Notes (Signed)
Inpatient Diabetes Program Recommendations  AACE/ADA: New Consensus Statement on Inpatient Glycemic Control (2015)  Target Ranges:  Prepandial:   less than 140 mg/dL      Peak postprandial:   less than 180 mg/dL (1-2 hours)      Critically ill patients:  140 - 180 mg/dL   Lab Results  Component Value Date   GLUCAP 232 (H) 09/09/2020   HGBA1C 8.8 (A) 07/21/2020    Review of Glycemic Control Results for KEIFFER, PIPER (MRN [id number]) as of 09/09/2020 14:38  Ref. Range 04/28/2020 13:43 07/21/2020 13:40 09/07/2020 17:56 09/08/2020 13:23 09/09/2020 05:50  Glucose Latest Ref Range: 70 - 99 mg/dL 397 (H) 673 (H) 419 (H) 206 (H) 54 (L)  Results for KAYLUM, SHRUM (MRN [id number]) as of 09/09/2020 14:38  Ref. Range 09/08/2020 17:04 09/08/2020 19:40 09/08/2020 23:07 09/09/2020 07:21 09/09/2020 11:58  Glucose-Capillary Latest Ref Range: 70 - 99 mg/dL 353 (H) 299 (H) 242 (H) 52 (L) 232 (H)   Diabetes history: Latent Autoimmune Diabetes Outpatient Diabetes medications:  Lantus 32 units q PM, Novolog 6-10 units tid with meals Current orders for Inpatient glycemic control:  Lantus 25 units q HS, Novolog sensitive tid with meals and HS  Inpatient Diabetes Program Recommendations:    Note low fasting CBG.  Recommend reduction of Lantus to 18 units q HS.  Also consider reducing Novolog correction to very-sensitive (0-6 units) tid with meals and add low dose meal coverage 2 units tid with meals (hold if patient eats less than 50% or NPO).   Thanks  Beryl Meager, RN, BC-ADM Inpatient Diabetes Coordinator Pager [phone] (8a-5p)

## 2020-09-09 NOTE — ED Notes (Signed)
Breakfast Ordered 

## 2020-09-09 NOTE — Progress Notes (Signed)
Patient ID: Peter Mendez., male   DOB: 1961/02/15, 60 y.o.   MRN: [id number]  Progress Note    09/09/2020 2:36 PM * No surgery date entered *  Subjective: The patient is mated to his room from the [facility] room.  [name] stable.  [name] here today with his fiance.  Again discussed plan for surgery for Friday.  Again discussed the magnitude of surgery with aortobifemoral bypass and right femoral to popliteal bypass.  He does have extensive tissue loss on his right foot and is concern regarding salvage of his foot even with revascularization   Vitals:   09/09/20 1110 09/09/20 1349  BP: (!) 159/64 (!) 142/65  Pulse: 68 60  Resp: 17 18  Temp: 98.2 F (36.8 C) 98 F (36.7 C)  SpO2: 100% 100%   Physical Exam: No change.  No femoral pulses  CBC    Component Value Date/Time   WBC 5.3 09/09/2020 0550   RBC 4.70 09/09/2020 0550   HGB 13.2 09/09/2020 0550   HCT 41.0 09/09/2020 0550   PLT 197 09/09/2020 0550   MCV 87.2 09/09/2020 0550   MCH 28.1 09/09/2020 0550   MCHC 32.2 09/09/2020 0550   RDW 13.5 09/09/2020 0550   LYMPHSABS 1.4 09/09/2020 0550   MONOABS 0.7 09/09/2020 0550   EOSABS 0.2 09/09/2020 0550   BASOSABS 0.0 09/09/2020 0550    BMET    Component Value Date/Time   NA 133 (L) 09/09/2020 0550   K 3.4 (L) 09/09/2020 0550   CL 95 (L) 09/09/2020 0550   CO2 24 09/09/2020 0550   GLUCOSE 54 (L) 09/09/2020 0550   BUN 7 (L) 09/09/2020 0550   CREATININE 0.65 09/09/2020 0550   CALCIUM 8.1 (L) 09/09/2020 0550   GFRNONAA >60 09/09/2020 0550    INR No results found for: INR  No intake or output data in the 24 hours ending 09/09/20 1436   Assessment/Plan:  60 y.o. male appreciate cardiology input.  Appreciate internal medicine service care.  Sodium improved to 133.     Larina Earthly, MD FACS [facility] [phone] 09/09/2020 2:36 PM

## 2020-09-09 NOTE — Progress Notes (Addendum)
Subjective: Patient reports significant pain overnight, with some improvement to 6/10 following pain meds. Denies chest pain, abdominal pain, and sob. Expressed understanding on plan for revascularization procedure Friday.    Objective:  Vital signs in last 24 hours: Vitals:   09/09/20 0655 09/09/20 1038 09/09/20 1100 09/09/20 1110  BP: (!) 170/52 (!) 171/58  (!) 159/64  Pulse: 76 62  68  Resp: (!) 22 16  17   Temp:    98.2 F (36.8 C)  TempSrc:      SpO2: 99% 100%  100%  Weight:   59.9 kg   Height:   5\' 11"  (1.803 m)    Weight change:  No intake or output data in the 24 hours ending 09/09/20 1301   Physical Exam Constitutional:      General: [name] not in acute distress.    Appearance: [name] underweight. [name] not toxic-appearing or diaphoretic.     Comments: Thin, pleasant gentleman seen lying in bed, conversant, NAD.   Cardiovascular:     Rate and Rhythm: Normal rate and regular rhythm.  Pulmonary:     Effort: Pulmonary effort is normal. No respiratory distress.  Abdominal:     Palpations: Abdomen is soft.     Tenderness: There is no abdominal tenderness.  Musculoskeletal:     Comments: Numerous dry, necrotic ulcerations along RLE, extending mid-shin. Surrounding erythema.   Neurological:     Mental Status: [name] alert.     Assessment/Plan:  Active Problems:   PVD (peripheral vascular disease) (HCC)   Lower limb ischemia  Yeudiel T. 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 CT Angio showed occlusion of infrarenal abdominal aorta and bilateral common and external iliacs; significant stenosis at junction of inferior epigastrics and common femorals, distal right SFA occlusion, and heavily occluded left SFA.    Arterial Disease with Infrarenal Aortic Occlusion, Bilateral Common and Femoral Occlusions   Dry Gangrene with Moderate Cellulitis RLE Superficial Femoral Artery Occlusions - Bilateral No systemic signs or symptoms of infection. Worsening dry gangrenous ulcerations for the past month. Signs of cellulitis extending to lower calf without purulence, fluctuance or crepitus. Remains afebrile with normal WBC, and clinical stability. MRSA positive per Nasopharyngeal PCR. Patient with 6/10 pain on current pain regimine.   -Appreciate cardiology consult for surgical clearance, echo pending -Orthopedics and Vascular following  -Plan for revascularization Friday (aortobifemoral bypass and right fem pop bypass)  -Hopeful for limb salvage however high risk for amputation after revascularization given extensive disease -Started Vancomycin per pharmacy -Continue ceftriaxone 2g qd  - will need PT/OT after definitive treatment  - f/u blood cultures - morphine 2mg  q2 prn, percocet q4 prn -Started scheduled Tylenol BID    Latent Autoimmune Diabetes Endorses using lantus 32U qam and novolog 3-4U tid with meals. Hypoglycemic in AM##   -SSI moderate and qhs ssi -decreased lantus to 18 units, Novolog 0-6   Chronic Hyponatremia Chronic. Na 125 and 128 corrected to 130 and 131 for hyperglycemia, which appear to be around corrected baseline. Possibly 2/2 tea and toast diet. Patient is very thin with BMI 18 and albumin 2.9. Chart reviewed and there is no recent weight loss. Sodium 133 today with low glucose. Hyponatremia workup in June 2021 showed low urine sodium and appropriate urine osmolality.    - Ur Na and Urine Osms pending  Hypokalemia Patient with drop in potassium to 3.4 from on 4.9 admission. Magnesium appropriate. Repleated.  -Monitor BMP   Tobacco Use Patient considering trying to quit while admitted   - nicotine patch     HTN Patient with hx of HTN on home ramipril. BP remained elevated on home ramipril overnight, but bp appropriate at bedside.  -continue Home ramipril   -consider addition of additional agent if required    Anxiety Continue Xanax 1 mg tid prn     LOS: 1 day   Audria Nine, Medical Student 09/09/2020, 11:32 AM

## 2020-09-09 NOTE — Progress Notes (Signed)
Progress Note  Patient Name: Peter Mendez. Date of Encounter: 09/09/2020  [facility] Cardiologist: No primary care provider on file.   Subjective   Patient feeling fine.  No chest pain or shortness of breath.  Inpatient Medications    Scheduled Meds: . acetaminophen  650 mg Oral BID  . [START ON 09/10/2020] Chlorhexidine Gluconate Cloth  6 each Topical Q0600  . gabapentin  300 mg Oral TID  . heparin  5,000 Units Subcutaneous Q8H  . insulin aspart  0-6 Units Subcutaneous TID WC  . insulin glargine  18 Units Subcutaneous QHS  . mupirocin ointment  1 application Nasal BID  . nicotine  21 mg Transdermal Daily  . ramipril  5 mg Oral Daily  . senna  1 tablet Oral Daily   Continuous Infusions: . cefTRIAXone (ROCEPHIN)  IV Stopped (09/09/20 0647)  . vancomycin     PRN Meds: ALPRAZolam, morphine injection, oxyCODONE-acetaminophen   Vital Signs    Vitals:   09/09/20 1038 09/09/20 1100 09/09/20 1110 09/09/20 1349  BP: (!) 171/58  (!) 159/64 (!) 142/65  Pulse: 62  68 60  Resp: 16  17 18   Temp:   98.2 F (36.8 C) 98 F (36.7 C)  TempSrc:      SpO2: 100%  100% 100%  Weight:  59.9 kg    Height:  5\' 11"  (1.803 m)      Intake/Output Summary (Last 24 hours) at 09/09/2020 1817 Last data filed at 09/09/2020 1514 Gross per 24 hour  Intake 200.09 ml  Output 300 ml  Net -99.91 ml   Last 3 Weights 09/09/2020 09/08/2020 08/10/2020  Weight (lbs) 132 lb 132 lb 0.9 oz 132 lb  Weight (kg) 59.875 kg 59.9 kg 59.875 kg       Physical Exam  Alert, oriented, no distress GEN: No acute distress.   Neck: No JVD Cardiac: RRR Respiratory: Clear to auscultation bilaterally. GI: Soft, nontender, non-distended  MS:  1+ bilateral pretibial edema; No deformity. Neuro:  Nonfocal  Psych: Normal affect   Labs    High Sensitivity Troponin:  No results for input(s): TROPONINIHS in the last 720 hours.    Chemistry Recent Labs  Lab 09/07/20 1756 09/08/20 1323 09/09/20 0550  NA 125* 128*  133*  K 4.9 4.1 3.4*  CL 87* 92* 95*  CO2 27 27 24   GLUCOSE 289* 206* 54*  BUN 6* <5* 7*  CREATININE 0.75 0.59* 0.65  CALCIUM 8.9 8.3* 8.1*  PROT 6.9  --  5.9*  ALBUMIN 2.9*  --  2.5*  AST 17  --  16  ALT 10  --  7  ALKPHOS 112  --  89  BILITOT 1.0  --  0.7  GFRNONAA >60 >60 >60  ANIONGAP 11 9 14      Hematology Recent Labs  Lab 09/07/20 1756 09/09/20 0550  WBC 10.0 5.3  RBC 4.47 4.70  HGB 13.0 13.2  HCT 37.7* 41.0  MCV 84.3 87.2  MCH 29.1 28.1  MCHC 34.5 32.2  RDW 13.5 13.5  PLT 239 197    BNPNo results for input(s): BNP, PROBNP in the last 168 hours.   DDimer No results for input(s): DDIMER in the last 168 hours.   Radiology    CT Angio Aortobifemoral W and/or Wo Contrast  Result Date: 09/08/2020 CLINICAL DATA:  Claudication/leg ischemia Right foot pain and discoloration EXAM: CT ANGIOGRAPHY OF ABDOMINAL AORTA WITH ILIOFEMORAL RUNOFF TECHNIQUE: Multidetector CT imaging of the abdomen, pelvis and lower extremities  was performed using the standard protocol during bolus administration of intravenous contrast. Multiplanar CT image reconstructions and MIPs were obtained to evaluate the vascular anatomy. CONTRAST:  OMNIPAQUE IOHEXOL 350 MG/ML SOLN COMPARISON:  None. FINDINGS: VASCULAR Aorta: The infrarenal abdominal aorta is occluded. Suprarenal abdominal aorta is patent with scattered atherosclerotic plaque. No aneurysm. No dissection of the suprarenal abdominal aorta. Celiac: Mild stenosis at the origin. Otherwise patent. There is moderate to severe stenosis at the origin of the common hepatic artery. The right hepatic artery is replaced from the superior mesenteric. SMA: Patent without evidence of aneurysm, dissection, vasculitis or significant stenosis. Renals: Mild stenosis at the origin of both renal arteries secondary to calcified plaque. IMA: Occluded origin and proximal segment. Opacification of distal segments indicative of retrograde collateral flow. RIGHT Lower  Extremity Inflow: Common and external iliac arteries are occluded. Proximal left internal iliac artery is occluded. Opacification of distal branches indicative of retrograde collateral flow. Outflow: Flow reconstitutes within the right common femoral artery which is diffusely narrowed due to calcified plaque. Mild narrowing at the origin of the profunda femorals artery which is otherwise patent. The proximal 5 cm of the SFA is patent but diffusely diseased. The remainder of the right superficial femoral artery is occluded. Flow reconstitutes within the right popliteal artery which is diminutive but patent. Runoff: Tibioperoneal trunk is patent. There is 2 vessel runoff to the right ankle through the anterior tibial and peroneal arteries. Posterior tibial artery is occluded beyond the mid lower leg. LEFT Lower Extremity Inflow: left common and external iliac arteries are occluded. The left common iliac artery appears severely narrowed. Proximal internal iliac artery is occluded. Opacification of distal branches indicative retrograde collateral flow. Outflow: Flow reconstitutes within the left common femoral artery. Scattered atheromatous plaque without high-grade stenosis of the left common femoral artery. Profundus femorals artery is patent without significant narrowing. There is severe stenosis of the origin of the left superficial femoral artery. The distal half of the SFA is heavily diseased with multiple foci of near complete occlusion. Pelvic tail artery is diminutive but patent. Runoff: Severe stenosis of the distal tibioperoneal trunk due to calcified plaque. Severe stenosis at the origin of the anterior tibial artery which is otherwise patent. There is three-vessel runoff to the left ankle. Veins: No obvious venous abnormality within the limitations of this arterial phase study. Review of the MIP images confirms the above findings. NON-VASCULAR Lower chest: Minimal bibasilar atelectasis. Mild emphysematous  changes seen at the lung bases. Advanced coronary artery calcifications. Heart size within normal limits. Hepatobiliary: No focal hepatic lesion. No bile duct dilatation. Minimal cholelithiasis. Gallbladder otherwise unremarkable. Pancreas: No significant abnormality. Evaluation limited due to lack of intraperitoneal fat and arterial phase imaging. Spleen: Normal in size without focal abnormality. Adrenals/Urinary Tract: 11 mm right adrenal nodule is unchanged in size compared to chest CT from 04/29/2020. The internal density of the lesion on the chest CT is consistent with adenoma. The adrenal glands are thickened is indicative of hyperplasia. Kidneys, ureters, and bladder are normal. Stomach/Bowel: Stomach is within normal limits. Appendix appears normal. No evidence of bowel wall thickening, distention, or inflammatory changes. Lymphatic: No significant vascular findings are present. No enlarged abdominal or pelvic lymph nodes. Reproductive: Mildly enlarged prostate. Other: No abdominal wall hernia or abnormality. No abdominopelvic ascites. Musculoskeletal: Deformity of the sternum consistent with prior healed fracture. Otherwise no acute abnormality. IMPRESSION: VASCULAR 1. 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 Flow reconstitutes in the  common femoral arteries from prominent inferior epigastric and deep circumflex iliac artery branches. High-grade stenosis noted at the junction of the inferior epigastric arteries and common femoral arteries bilaterally. 2. Right superficial femoral artery is occluded with exception of the proximal 5 cm. 3. Heavily diseased left superficial femoral artery with multiple foci of near complete occlusion in the distal half. NON-VASCULAR No acute abnormality of the abdomen or pelvis. Electronically Signed   By: Acquanetta Belling M.D.   On: 09/08/2020 08:56    Cardiac Studies   2D echo images reviewed.  Formal interpretation  pending.  Patient Profile     60 y.o. male seen for preoperative cardiovascular evaluation with aortoiliac occlusion and ischemic rest pain with dry gangrene of the right foot  Assessment & Plan    1.  Peripheral arterial disease with aortoiliac occlusion and ischemic rest pain/gangrene.  Plans for aortobifemoral bypass surgery by Dr. Arbie Cookey on Friday. 2.  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  Echo images personally reviewed and by my assessment, the patient has an akinetic segment of the mid to distal anteroseptal wall.  The other LV wall segments appear normal.  I do not appreciate any significant valvular disease or evidence of RV dysfunction.  He has no angina, active heart failure symptoms, or symptoms of arrhythmia.  We will add a low-dose beta-blocker for perioperative management.  He has at least at intermediate cardiac risk of surgery.  However, considering the critical nature of his peripheral arterial disease with gangrene and a threatened leg, I think it is appropriate and reasonable to proceed with revascularization surgery as planned.  Again, the patient is not having any active cardiac symptoms.  Will follow postoperatively. Discussed with Dr Arbie Cookey.   For questions or updates, please contact [facility] Please consult [email or web address] for contact info under     Signed, Tonny Bollman, MD  09/09/2020, 6:17 PM

## 2020-09-09 NOTE — Progress Notes (Signed)
Pharmacy Antibiotic Note  Peter Mendez. is a 60 y.o. male admitted on 09/07/2020 with cellulitis.  Pharmacy has been consulted for vancomycin dosing. Also on ceftriaxone per MD. SCr 0.65 stable.  Plan: Vancomycin 1250mg  IV x 1; then 750mg  IV q12h. Goal AUC 400-550. Expected AUC: 479 SCr used: 0.8 Ceftriaxone 2g IV q24h per MD Monitor clinical progress, c/s, renal function F/u de-escalation plan/LOT, vancomycin levels as indicated   Height: 5\' 11"  (180.3 cm) Weight: 59.9 kg (132 lb 0.9 oz) IBW/kg (Calculated) : 75.3  Temp (24hrs), Avg:98.2 F (36.8 C), Min:98.1 F (36.7 C), Max:98.3 F (36.8 C)  Recent Labs  Lab 09/07/20 1756 09/08/20 0712 09/08/20 1323 09/09/20 0550  WBC 10.0  --   --  5.3  CREATININE 0.75  --  0.59* 0.65  LATICACIDVEN 1.9 1.1  --   --     Estimated Creatinine Clearance: 82.2 mL/min (by C-G formula based on SCr of 0.65 mg/dL).    Allergies  Allergen Reactions  . Pravastatin Sodium Other (See Comments)    myalgia    11/06/20, PharmD, BCPS Please check AMION for all [facility] contact numbers Clinical Pharmacist 09/09/2020 8:13 AM

## 2020-09-10 ENCOUNTER — Encounter (HOSPITAL_COMMUNITY): Payer: Self-pay | Admitting: Internal Medicine

## 2020-09-10 DIAGNOSIS — T148XXA Other injury of unspecified body region, initial encounter: Secondary | ICD-10-CM

## 2020-09-10 LAB — COMPREHENSIVE METABOLIC PANEL
ALT: 9 U/L (ref 0–44)
AST: 19 U/L (ref 15–41)
Albumin: 2.6 g/dL — ABNORMAL LOW (ref 3.5–5.0)
Alkaline Phosphatase: 82 U/L (ref 38–126)
Anion gap: 10 (ref 5–15)
BUN: 9 mg/dL (ref 8–23)
CO2: 28 mmol/L (ref 22–32)
Calcium: 8.7 mg/dL — ABNORMAL LOW (ref 8.9–10.3)
Chloride: 96 mmol/L — ABNORMAL LOW (ref 98–111)
Creatinine, Ser: 0.69 mg/dL (ref 0.61–1.24)
GFR, Estimated: >60 mL/min (ref >60)
Glucose, Bld: 212 mg/dL — ABNORMAL HIGH (ref 70–99)
Potassium: 4.5 mmol/L (ref 3.5–5.1)
Sodium: 134 mmol/L — ABNORMAL LOW (ref 135–145)
Total Bilirubin: 0.7 mg/dL (ref 0.3–1.2)
Total Protein: 6.2 g/dL — ABNORMAL LOW (ref 6.5–8.1)

## 2020-09-10 LAB — CBC
HCT: 36.9 % — ABNORMAL LOW (ref 39.0–52.0)
Hemoglobin: 12.1 g/dL — ABNORMAL LOW (ref 13.0–17.0)
MCH: 28.5 pg (ref 26.0–34.0)
MCHC: 32.8 g/dL (ref 30.0–36.0)
MCV: 87 fL (ref 80.0–100.0)
Platelets: 220 10*3/uL (ref 150–400)
RBC: 4.24 MIL/uL (ref 4.22–5.81)
RDW: 13.6 % (ref 11.5–15.5)
WBC: 8.6 10*3/uL (ref 4.0–10.5)
nRBC: 0 % (ref 0.0–0.2)

## 2020-09-10 LAB — GLUCOSE, CAPILLARY
Glucose-Capillary: 164 mg/dL — ABNORMAL HIGH (ref 70–99)
Glucose-Capillary: 133 mg/dL — ABNORMAL HIGH (ref 70–99)
Glucose-Capillary: 244 mg/dL — ABNORMAL HIGH (ref 70–99)
Glucose-Capillary: 66 mg/dL — ABNORMAL LOW (ref 70–99)
Glucose-Capillary: 119 mg/dL — ABNORMAL HIGH (ref 70–99)

## 2020-09-10 LAB — MAGNESIUM: Magnesium: 1.9 mg/dL (ref 1.7–2.4)

## 2020-09-10 MED ORDER — MAGNESIUM CITRATE PO SOLN
1.0000 | Freq: Once | ORAL | Status: DC
  Filled 2020-09-10: qty 296

## 2020-09-10 MED ORDER — INSULIN GLARGINE 100 UNIT/ML ~~LOC~~ SOLN
12.0000 [IU] | Freq: Every day | SUBCUTANEOUS | Status: DC
  Administered 2020-09-10: 23:00:00 12 [IU] via SUBCUTANEOUS
  Filled 2020-09-10 (×3): qty 0.12

## 2020-09-10 MED ORDER — RAMIPRIL 10 MG PO CAPS
10.0000 mg | ORAL_CAPSULE | Freq: Every day | ORAL | Status: DC
  Filled 2020-09-10: qty 1

## 2020-09-10 MED ORDER — HYDRALAZINE HCL 10 MG PO TABS
15.0000 mg | ORAL_TABLET | Freq: Once | ORAL | Status: AC
  Administered 2020-09-10: 15 mg via ORAL
  Filled 2020-09-10: qty 2

## 2020-09-10 NOTE — Progress Notes (Addendum)
  Progress Note    09/10/2020 7:29 AM * No surgery date entered *  Subjective:  C/o of RLE pain. Sitting up on side of bed eating breakfast   Vitals:   09/09/20 2318 09/10/20 0631  BP: (!) 156/63 (!) 161/70  Pulse: (!) 50 (!) 55  Resp: 17 18  Temp: 98.5 F (36.9 C) 97.7 F (36.5 C)  SpO2: 100% 100%    Physical Exam: General appearance: Awake, alert in no apparent distress Cardiac: Heart rate and rhythm are regular Respirations: Nonlabored, CTAB Extremities: Dry, ischemic ulcers of left foot, ankle/lower leg    CBC    Component Value Date/Time   WBC 5.3 09/09/2020 0550   RBC 4.70 09/09/2020 0550   HGB 13.2 09/09/2020 0550   HCT 41.0 09/09/2020 0550   PLT 197 09/09/2020 0550   MCV 87.2 09/09/2020 0550   MCH 28.1 09/09/2020 0550   MCHC 32.2 09/09/2020 0550   RDW 13.5 09/09/2020 0550   LYMPHSABS 1.4 09/09/2020 0550   MONOABS 0.7 09/09/2020 0550   EOSABS 0.2 09/09/2020 0550   BASOSABS 0.0 09/09/2020 0550    BMET    Component Value Date/Time   NA 133 (L) 09/09/2020 0550   K 3.4 (L) 09/09/2020 0550   CL 95 (L) 09/09/2020 0550   CO2 24 09/09/2020 0550   GLUCOSE 54 (L) 09/09/2020 0550   BUN 7 (L) 09/09/2020 0550   CREATININE 0.65 09/09/2020 0550   CALCIUM 8.1 (L) 09/09/2020 0550   GFRNONAA >60 09/09/2020 0550     Intake/Output Summary (Last 24 hours) at 09/10/2020 0729 Last data filed at 09/10/2020 0631 Gross per 24 hour  Intake 400.09 ml  Output 725 ml  Net -324.91 ml    HOSPITAL MEDICATIONS Scheduled Meds: . acetaminophen  650 mg Oral BID  . carvedilol  3.125 mg Oral BID WC  . Chlorhexidine Gluconate Cloth  6 each Topical Q0600  . gabapentin  300 mg Oral TID  . heparin  5,000 Units Subcutaneous Q8H  . insulin aspart  0-6 Units Subcutaneous TID WC  . insulin glargine  18 Units Subcutaneous QHS  . mupirocin ointment  1 application Nasal BID  . nicotine  21 mg Transdermal Daily  . ramipril  5 mg Oral Daily  . senna  1 tablet Oral Daily   Continuous  Infusions: . cefTRIAXone (ROCEPHIN)  IV 2 g (09/10/20 0537)  . 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 VSS. Afebrile -Cardiology eval completed>>intermediate cardiac risk of surgery -Hyponatremia>>trending upward. Normal serum creatinine. CBC WNL -Plan for aortobifemoral bypass and right femoral to popliteal bypass tomorrow by Dr. Bonney Aid, PA-C [facility] [phone] 09/10/2020  7:29 AM   I have examined the patient, reviewed and agree with above. Again discussed the procedure with patient who understands. We will plan aortobifemoral bypass and right femoral to popliteal bypass tomorrow.  Gretta Began, MD 09/10/2020 11:30 AM

## 2020-09-10 NOTE — Progress Notes (Signed)
Subjective:  Patient reports pain control appropriate on current regimen, with added scheduled tylenol. Waiting on effects since recently received scheduled pain med. Expressed ready for revascularization procedure tomorrow.    Objective:  Vital signs in last 24 hours: Vitals:   09/09/20 1110 09/09/20 1349 09/09/20 2318 09/10/20 0631  BP: (!) 159/64 (!) 142/65 (!) 156/63 (!) 161/70  Pulse: 68 60 (!) 50 (!) 55  Resp: 17 18 17 18   Temp: 98.2 F (36.8 C) 98 F (36.7 C) 98.5 F (36.9 C) 97.7 F (36.5 C)  TempSrc:   Oral Oral  SpO2: 100% 100% 100% 100%  Weight:      Height:       Weight change: -0.025 kg  Intake/Output Summary (Last 24 hours) at 09/10/2020 1116 Last data filed at 09/10/2020 11/08/2020 Gross per 24 hour  Intake 640.09 ml  Output 1027 ml  Net -386.91 ml     Physical Exam Constitutional:      General: [name] not in acute distress.    Appearance: [name] not toxic-appearing.  Cardiovascular:     Rate and Rhythm: Regular rhythm. Bradycardia present.  Pulmonary:     Effort: Pulmonary effort is normal. No respiratory distress.     Breath sounds: Normal breath sounds.  Musculoskeletal:     Comments: Scattered necrotic ulcerations on RE, to pre-tibial region  Neurological:     Mental Status: [name] alert.     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 CT Angio showed occlusion of infrarenal abdominal aorta and bilateral common and external iliacs; significant stenosis at junction of inferior epigastrics and common femorals, distal right SFA occlusion, and heavily occluded left SFA.    Arterial Disease with  Infrarenal Aortic Occlusion, Bilateral Common and Femoral Occlusions  Dry Gangrene with Moderate Cellulitis RLE Superficial Femoral Artery Occlusions - Bilateral No systemic signs or symptoms of infection. Worsening dry gangrenous ulcerations for the past month. Signs of cellulitis extending to lower calf without fluctuance or crepitus. Remains afebrile with normal WBC, and clinical stability. MRSA positive per Nasopharyngeal PCR. Patient pain improving on current regimine.   -Appreciate cardiology consult for surgical clearance  -intermediate risk; cleared  -Orthopedics and Vascular following  -Plan for revascularization Friday (aortobifemoral bypass and right fem pop bypass)  -Hopeful for limb salvage however high risk for amputation after revascularization given extensive disease -Continue Vancomycin per pharmacy -Continue ceftriaxone 2g qd  - will need PT/OT after definitive treatment  - f/u blood cultures - morphine 2mg  q2 prn, percocet q4 prn, scheduled Tylenol BID    Latent Autoimmune Diabetes Endorses using lantus 32U qam and novolog 3-4U tid with meals.   -SSI and qhs ssi -lantus 18 units, Novolog 0-6   Chronic Hyponatremia Chronic. Possibly 2/2 tea and toast diet. Patient is very thin with BMI 18 and albumin 2.9. Chart reviewed and there is no recent weight loss. Sodium improved to 134 today (corrected 136). Hyponatremia workup in June 2021 showed low urine sodium and appropriate urine osmolality. Urine osmolality during this admission appropriate, urine sodium 120 (appropriate given low-normal corrected sodium).   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 BP remained elevated on home ramipril.  Cardiology recommending low dose beta blocker for perioperative management, in light of echo showing  anteroseptal wall abnormalities consistent with age-indeterminate prior MI. Newly bradycardic in 50s per monitor, so holding off on beta blocker per additional Cardiology rec. Persistent HTN on home ramipril.   -Increased home ramipril to 10mg  -Hold off on B-blocker at this time in light of bradycardia   Anxiety Continue Xanax 1 mg tid prn    LOS: 2 days   , Medical Student 09/10/2020, 11:16 AM

## 2020-09-10 NOTE — Progress Notes (Incomplete)
Hypoglycemic Event  CBG: 66  Treatment: Orange juice and crake and peanut butter  Symptoms: pt c/o feeling weak and shakey  Follow-up CBG: Time: 2050 CBG Result:166  Possible Reasons for Event: N/A  Comments/MD notified: 2019 MD on call paged Glenford Bayley returned paged at 2032, no new orders given. Will continue to monitor.    Ronan Duecker Bernadene Bell

## 2020-09-10 NOTE — H&P (View-Only) (Signed)
  Progress Note    09/10/2020 7:29 AM * No surgery date entered *  Subjective:  C/o of RLE pain. Sitting up on side of bed eating breakfast   Vitals:   09/09/20 2318 09/10/20 0631  BP: (!) 156/63 (!) 161/70  Pulse: (!) 50 (!) 55  Resp: 17 18  Temp: 98.5 F (36.9 C) 97.7 F (36.5 C)  SpO2: 100% 100%    Physical Exam: General appearance: Awake, alert in no apparent distress Cardiac: Heart rate and rhythm are regular Respirations: Nonlabored, CTAB Extremities: Dry, ischemic ulcers of left foot, ankle/lower leg    CBC    Component Value Date/Time   WBC 5.3 09/09/2020 0550   RBC 4.70 09/09/2020 0550   HGB 13.2 09/09/2020 0550   HCT 41.0 09/09/2020 0550   PLT 197 09/09/2020 0550   MCV 87.2 09/09/2020 0550   MCH 28.1 09/09/2020 0550   MCHC 32.2 09/09/2020 0550   RDW 13.5 09/09/2020 0550   LYMPHSABS 1.4 09/09/2020 0550   MONOABS 0.7 09/09/2020 0550   EOSABS 0.2 09/09/2020 0550   BASOSABS 0.0 09/09/2020 0550    BMET    Component Value Date/Time   NA 133 (L) 09/09/2020 0550   K 3.4 (L) 09/09/2020 0550   CL 95 (L) 09/09/2020 0550   CO2 24 09/09/2020 0550   GLUCOSE 54 (L) 09/09/2020 0550   BUN 7 (L) 09/09/2020 0550   CREATININE 0.65 09/09/2020 0550   CALCIUM 8.1 (L) 09/09/2020 0550   GFRNONAA >60 09/09/2020 0550     Intake/Output Summary (Last 24 hours) at 09/10/2020 0729 Last data filed at 09/10/2020 0631 Gross per 24 hour  Intake 400.09 ml  Output 725 ml  Net -324.91 ml    HOSPITAL MEDICATIONS Scheduled Meds: . acetaminophen  650 mg Oral BID  . carvedilol  3.125 mg Oral BID WC  . Chlorhexidine Gluconate Cloth  6 each Topical Q0600  . gabapentin  300 mg Oral TID  . heparin  5,000 Units Subcutaneous Q8H  . insulin aspart  0-6 Units Subcutaneous TID WC  . insulin glargine  18 Units Subcutaneous QHS  . mupirocin ointment  1 application Nasal BID  . nicotine  21 mg Transdermal Daily  . ramipril  5 mg Oral Daily  . senna  1 tablet Oral Daily   Continuous  Infusions: . cefTRIAXone (ROCEPHIN)  IV 2 g (09/10/20 0537)  . 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 VSS. Afebrile -Cardiology eval completed>>intermediate cardiac risk of surgery -Hyponatremia>>trending upward. Normal serum creatinine. CBC WNL -Plan for aortobifemoral bypass and right femoral to popliteal bypass tomorrow by Dr. Chandy Mendez  Peter SETZER, PA-C [facility] [phone] 09/10/2020  7:29 AM   I have examined the patient, reviewed and agree with above. Again discussed the procedure with patient who understands. We will plan aortobifemoral bypass and right femoral to popliteal bypass tomorrow.  Peter Roessner, MD 09/10/2020 11:30 AM  

## 2020-09-11 ENCOUNTER — Encounter (HOSPITAL_COMMUNITY): Payer: Self-pay | Admitting: Internal Medicine

## 2020-09-11 ENCOUNTER — Inpatient Hospital Stay (HOSPITAL_COMMUNITY): Payer: Medicare Other | Admitting: Anesthesiology

## 2020-09-11 ENCOUNTER — Encounter (HOSPITAL_COMMUNITY)
Admission: EM | Disposition: A | Discharge status: Home Health | Payer: Self-pay | Source: Home / Self Care | Attending: Internal Medicine

## 2020-09-11 ENCOUNTER — Inpatient Hospital Stay (HOSPITAL_COMMUNITY): Payer: Medicare Other

## 2020-09-11 DIAGNOSIS — I998 Other disorder of circulatory system: Secondary | ICD-10-CM | POA: Diagnosis not present

## 2020-09-11 DIAGNOSIS — T148XXA Other injury of unspecified body region, initial encounter: Secondary | ICD-10-CM | POA: Diagnosis not present

## 2020-09-11 DIAGNOSIS — E139 Other specified diabetes mellitus without complications: Secondary | ICD-10-CM

## 2020-09-11 DIAGNOSIS — I739 Peripheral vascular disease, unspecified: Secondary | ICD-10-CM | POA: Diagnosis not present

## 2020-09-11 DIAGNOSIS — I771 Stricture of artery: Secondary | ICD-10-CM

## 2020-09-11 DIAGNOSIS — I714 Abdominal aortic aneurysm, without rupture: Secondary | ICD-10-CM

## 2020-09-11 HISTORY — PX: AORTA - BILATERAL FEMORAL ARTERY BYPASS GRAFT: SHX1175

## 2020-09-11 HISTORY — PX: FEMORAL-POPLITEAL BYPASS GRAFT: SHX937

## 2020-09-11 LAB — BASIC METABOLIC PANEL
Anion gap: 12 (ref 5–15)
BUN: 6 mg/dL — ABNORMAL LOW (ref 8–23)
CO2: 23 mmol/L (ref 22–32)
Calcium: 8.5 mg/dL — ABNORMAL LOW (ref 8.9–10.3)
Chloride: 97 mmol/L — ABNORMAL LOW (ref 98–111)
Creatinine, Ser: 0.61 mg/dL (ref 0.61–1.24)
GFR, Estimated: >60 mL/min (ref >60)
Glucose, Bld: 229 mg/dL — ABNORMAL HIGH (ref 70–99)
Potassium: 3.9 mmol/L (ref 3.5–5.1)
Sodium: 132 mmol/L — ABNORMAL LOW (ref 135–145)

## 2020-09-11 LAB — COMPREHENSIVE METABOLIC PANEL
ALT: 12 U/L (ref 0–44)
AST: 27 U/L (ref 15–41)
Albumin: 2.7 g/dL — ABNORMAL LOW (ref 3.5–5.0)
Alkaline Phosphatase: 101 U/L (ref 38–126)
Anion gap: 13 (ref 5–15)
BUN: 8 mg/dL (ref 8–23)
CO2: 28 mmol/L (ref 22–32)
Calcium: 8.8 mg/dL — ABNORMAL LOW (ref 8.9–10.3)
Chloride: 93 mmol/L — ABNORMAL LOW (ref 98–111)
Creatinine, Ser: 0.67 mg/dL (ref 0.61–1.24)
GFR, Estimated: >60 mL/min (ref >60)
Glucose, Bld: 78 mg/dL (ref 70–99)
Potassium: 3.8 mmol/L (ref 3.5–5.1)
Sodium: 134 mmol/L — ABNORMAL LOW (ref 135–145)
Total Bilirubin: 0.5 mg/dL (ref 0.3–1.2)
Total Protein: 6.5 g/dL (ref 6.5–8.1)

## 2020-09-11 LAB — CBC
HCT: 37.2 % — ABNORMAL LOW (ref 39.0–52.0)
HCT: 35 % — ABNORMAL LOW (ref 39.0–52.0)
Hemoglobin: 12.5 g/dL — ABNORMAL LOW (ref 13.0–17.0)
Hemoglobin: 11.6 g/dL — ABNORMAL LOW (ref 13.0–17.0)
MCH: 28.8 pg (ref 26.0–34.0)
MCH: 28.7 pg (ref 26.0–34.0)
MCHC: 33.6 g/dL (ref 30.0–36.0)
MCHC: 33.1 g/dL (ref 30.0–36.0)
MCV: 85.7 fL (ref 80.0–100.0)
MCV: 86.6 fL (ref 80.0–100.0)
Platelets: 243 10*3/uL (ref 150–400)
Platelets: 186 10*3/uL (ref 150–400)
RBC: 4.34 MIL/uL (ref 4.22–5.81)
RBC: 4.04 MIL/uL — ABNORMAL LOW (ref 4.22–5.81)
RDW: 13.5 % (ref 11.5–15.5)
RDW: 13.7 % (ref 11.5–15.5)
WBC: 7.6 10*3/uL (ref 4.0–10.5)
WBC: 8.8 10*3/uL (ref 4.0–10.5)
nRBC: 0 % (ref 0.0–0.2)
nRBC: 0 % (ref 0.0–0.2)

## 2020-09-11 LAB — POCT I-STAT 7, (LYTES, BLD GAS, ICA,H+H)
Acid-Base Excess: 6 mmol/L — ABNORMAL HIGH (ref 0.0–2.0)
Bicarbonate: 30.3 mmol/L — ABNORMAL HIGH (ref 20.0–28.0)
Calcium, Ion: 1.15 mmol/L (ref 1.15–1.40)
HCT: 33 % — ABNORMAL LOW (ref 39.0–52.0)
Hemoglobin: 11.2 g/dL — ABNORMAL LOW (ref 13.0–17.0)
O2 Saturation: 100 %
Patient temperature: 35.9
Potassium: 3.9 mmol/L (ref 3.5–5.1)
Sodium: 134 mmol/L — ABNORMAL LOW (ref 135–145)
TCO2: 32 mmol/L (ref 22–32)
pCO2 arterial: 38.6 mmHg (ref 32.0–48.0)
pH, Arterial: 7.499 — ABNORMAL HIGH (ref 7.350–7.450)
pO2, Arterial: 172 mmHg — ABNORMAL HIGH (ref 83.0–108.0)

## 2020-09-11 LAB — BLOOD GAS, ARTERIAL
Acid-Base Excess: 0.5 mmol/L (ref 0.0–2.0)
Bicarbonate: 24.9 mmol/L (ref 20.0–28.0)
FIO2: 44
O2 Saturation: 99.2 %
Patient temperature: 36.1
pCO2 arterial: 40.3 mmHg (ref 32.0–48.0)
pH, Arterial: 7.403 (ref 7.350–7.450)
pO2, Arterial: 190 mmHg — ABNORMAL HIGH (ref 83.0–108.0)

## 2020-09-11 LAB — GLUCOSE, CAPILLARY
Glucose-Capillary: 144 mg/dL — ABNORMAL HIGH (ref 70–99)
Glucose-Capillary: 191 mg/dL — ABNORMAL HIGH (ref 70–99)
Glucose-Capillary: 241 mg/dL — ABNORMAL HIGH (ref 70–99)
Glucose-Capillary: 248 mg/dL — ABNORMAL HIGH (ref 70–99)
Glucose-Capillary: 68 mg/dL — ABNORMAL LOW (ref 70–99)
Glucose-Capillary: 70 mg/dL (ref 70–99)

## 2020-09-11 LAB — SURGICAL PCR SCREEN
MRSA, PCR: POSITIVE — AB
Staphylococcus aureus: POSITIVE — AB

## 2020-09-11 LAB — POCT I-STAT, CHEM 8
BUN: 7 mg/dL — ABNORMAL LOW (ref 8–23)
Calcium, Ion: 1.17 mmol/L (ref 1.15–1.40)
Chloride: 94 mmol/L — ABNORMAL LOW (ref 98–111)
Creatinine, Ser: 0.3 mg/dL — ABNORMAL LOW (ref 0.61–1.24)
Glucose, Bld: 150 mg/dL — ABNORMAL HIGH (ref 70–99)
HCT: 33 % — ABNORMAL LOW (ref 39.0–52.0)
Hemoglobin: 11.2 g/dL — ABNORMAL LOW (ref 13.0–17.0)
Potassium: 3.9 mmol/L (ref 3.5–5.1)
Sodium: 134 mmol/L — ABNORMAL LOW (ref 135–145)
TCO2: 28 mmol/L (ref 22–32)

## 2020-09-11 LAB — PROTIME-INR
INR: 1.2 (ref 0.8–1.2)
Prothrombin Time: 14.9 seconds (ref 11.4–15.2)

## 2020-09-11 LAB — MAGNESIUM: Magnesium: 1.6 mg/dL — ABNORMAL LOW (ref 1.7–2.4)

## 2020-09-11 LAB — CORTISOL-AM, BLOOD: Cortisol - AM: 15.8 ug/dL (ref 6.7–22.6)

## 2020-09-11 LAB — PREPARE RBC (CROSSMATCH)

## 2020-09-11 LAB — APTT: aPTT: 38 seconds — ABNORMAL HIGH (ref 24–36)

## 2020-09-11 LAB — ABO/RH: ABO/RH(D): AB POS

## 2020-09-11 SURGERY — CREATION, BYPASS, ARTERIAL, AORTA TO FEMORAL, BILATERAL, USING GRAFT
Anesthesia: General | Site: Leg Upper | Laterality: Right

## 2020-09-11 MED ORDER — DEXAMETHASONE SODIUM PHOSPHATE 10 MG/ML IJ SOLN
INTRAMUSCULAR | Status: DC | PRN
  Administered 2020-09-11: 5 mg via INTRAVENOUS

## 2020-09-11 MED ORDER — HYDROMORPHONE HCL 1 MG/ML IJ SOLN
INTRAMUSCULAR | Status: AC
  Filled 2020-09-11: qty 1

## 2020-09-11 MED ORDER — SODIUM CHLORIDE 0.9% IV SOLUTION: Freq: Once | INTRAVENOUS | Status: AC

## 2020-09-11 MED ORDER — FENTANYL CITRATE (PF) 250 MCG/5ML IJ SOLN
INTRAMUSCULAR | Status: AC
  Filled 2020-09-11: qty 5

## 2020-09-11 MED ORDER — LIDOCAINE 2% (20 MG/ML) 5 ML SYRINGE
INTRAMUSCULAR | Status: AC
  Filled 2020-09-11: qty 5

## 2020-09-11 MED ORDER — RAMIPRIL 10 MG PO CAPS
10.0000 mg | ORAL_CAPSULE | Freq: Every day | ORAL | Status: DC
  Administered 2020-09-12: 10 mg
  Filled 2020-09-11: qty 1

## 2020-09-11 MED ORDER — SODIUM CHLORIDE 0.9 % IV SOLN: INTRAVENOUS | Status: DC | PRN

## 2020-09-11 MED ORDER — ALBUMIN HUMAN 5 % IV SOLN: INTRAVENOUS | Status: DC | PRN

## 2020-09-11 MED ORDER — GABAPENTIN 250 MG/5ML PO SOLN
300.0000 mg | Freq: Three times a day (TID) | ORAL | Status: DC
  Administered 2020-09-11 – 2020-09-12 (×2): 300 mg
  Filled 2020-09-11 (×5): qty 6

## 2020-09-11 MED ORDER — PHENYLEPHRINE HCL-NACL 10-0.9 MG/250ML-% IV SOLN
INTRAVENOUS | Status: DC | PRN
  Administered 2020-09-11: 80 ug/min via INTRAVENOUS
  Administered 2020-09-11: 30 ug/min via INTRAVENOUS
  Administered 2020-09-11: 80 ug/min via INTRAVENOUS

## 2020-09-11 MED ORDER — PHENYLEPHRINE 40 MCG/ML (10ML) SYRINGE FOR IV PUSH (FOR BLOOD PRESSURE SUPPORT)
PREFILLED_SYRINGE | INTRAVENOUS | Status: AC
  Filled 2020-09-11: qty 10

## 2020-09-11 MED ORDER — HYDROMORPHONE HCL 1 MG/ML IJ SOLN
0.2500 mg | INTRAMUSCULAR | Status: DC | PRN
  Administered 2020-09-11 (×4): 0.5 mg via INTRAVENOUS

## 2020-09-11 MED ORDER — LIDOCAINE 2% (20 MG/ML) 5 ML SYRINGE
INTRAMUSCULAR | Status: DC | PRN
  Administered 2020-09-11: 80 mg via INTRAVENOUS

## 2020-09-11 MED ORDER — MIDAZOLAM HCL 2 MG/2ML IJ SOLN
INTRAMUSCULAR | Status: AC
  Filled 2020-09-11: qty 2

## 2020-09-11 MED ORDER — LACTATED RINGERS IV SOLN: INTRAVENOUS | Status: DC | PRN

## 2020-09-11 MED ORDER — SODIUM CHLORIDE 0.9 % IV SOLN
INTRAVENOUS | Status: AC
  Filled 2020-09-11: qty 1.2

## 2020-09-11 MED ORDER — ACETAMINOPHEN 325 MG PO TABS
650.0000 mg | ORAL_TABLET | Freq: Two times a day (BID) | ORAL | Status: DC
  Administered 2020-09-11 – 2020-09-12 (×2): 650 mg
  Filled 2020-09-11: qty 2

## 2020-09-11 MED ORDER — EPHEDRINE SULFATE 50 MG/ML IJ SOLN
INTRAMUSCULAR | Status: DC | PRN
  Administered 2020-09-11: 5 mg via INTRAVENOUS

## 2020-09-11 MED ORDER — VANCOMYCIN HCL 1000 MG IV SOLR
INTRAVENOUS | Status: DC | PRN
  Administered 2020-09-11: 1000 mg via INTRAVENOUS

## 2020-09-11 MED ORDER — PROTAMINE SULFATE 10 MG/ML IV SOLN
INTRAVENOUS | Status: DC | PRN
  Administered 2020-09-11: 50 mg via INTRAVENOUS

## 2020-09-11 MED ORDER — MAGNESIUM SULFATE 2 GM/50ML IV SOLN
2.0000 g | Freq: Every day | INTRAVENOUS | Status: DC | PRN
Start: 2020-09-11 — End: 2020-09-25
  Filled 2020-09-11: qty 50

## 2020-09-11 MED ORDER — MAGNESIUM SULFATE 2 GM/50ML IV SOLN
2.0000 g | Freq: Once | INTRAVENOUS | Status: AC
  Administered 2020-09-11: 2 g via INTRAVENOUS
  Filled 2020-09-11: qty 50

## 2020-09-11 MED ORDER — DEXTROSE 50 % IV SOLN
INTRAVENOUS | Status: DC | PRN
  Administered 2020-09-11: 12.5 g via INTRAVENOUS

## 2020-09-11 MED ORDER — ACETAMINOPHEN 325 MG PO TABS: 325.0000 mg | ORAL_TABLET | ORAL | Status: DC | PRN

## 2020-09-11 MED ORDER — EPHEDRINE 5 MG/ML INJ
INTRAVENOUS | Status: AC
  Filled 2020-09-11: qty 10

## 2020-09-11 MED ORDER — LABETALOL HCL 5 MG/ML IV SOLN
INTRAVENOUS | Status: AC
  Filled 2020-09-11: qty 4

## 2020-09-11 MED ORDER — POTASSIUM CHLORIDE CRYS ER 20 MEQ PO TBCR
20.0000 meq | EXTENDED_RELEASE_TABLET | Freq: Every day | ORAL | Status: AC | PRN
Start: 2020-09-11 — End: 2020-09-12
  Administered 2020-09-12: 20 meq via ORAL
  Filled 2020-09-11: qty 2

## 2020-09-11 MED ORDER — PANTOPRAZOLE SODIUM 40 MG IV SOLR
40.0000 mg | INTRAVENOUS | Status: DC
  Administered 2020-09-11: 40 mg via INTRAVENOUS
  Filled 2020-09-11 (×2): qty 40

## 2020-09-11 MED ORDER — HEPARIN SODIUM (PORCINE) 1000 UNIT/ML IJ SOLN
INTRAMUSCULAR | Status: AC
  Filled 2020-09-11: qty 2

## 2020-09-11 MED ORDER — INSULIN GLARGINE 100 UNIT/ML ~~LOC~~ SOLN
10.0000 [IU] | Freq: Every day | SUBCUTANEOUS | Status: DC
  Administered 2020-09-11: 10 [IU] via SUBCUTANEOUS
  Filled 2020-09-11 (×2): qty 0.1

## 2020-09-11 MED ORDER — LABETALOL HCL 5 MG/ML IV SOLN
10.0000 mg | INTRAVENOUS | Status: DC | PRN
  Administered 2020-09-11 (×2): 10 mg via INTRAVENOUS
  Filled 2020-09-11: qty 4

## 2020-09-11 MED ORDER — PHENOL 1.4 % MT LIQD: 1.0000 | OROMUCOSAL | Status: DC | PRN

## 2020-09-11 MED ORDER — HYDRALAZINE HCL 20 MG/ML IJ SOLN
5.0000 mg | INTRAMUSCULAR | Status: AC | PRN
Start: 2020-09-11 — End: 2020-09-13
  Administered 2020-09-11 – 2020-09-13 (×2): 5 mg via INTRAVENOUS
  Filled 2020-09-11 (×3): qty 1

## 2020-09-11 MED ORDER — ACETAMINOPHEN 325 MG RE SUPP: 325.0000 mg | RECTAL | Status: DC | PRN

## 2020-09-11 MED ORDER — VANCOMYCIN HCL IN DEXTROSE 1-5 GM/200ML-% IV SOLN
1000.0000 mg | INTRAVENOUS | Status: DC
  Filled 2020-09-11: qty 200

## 2020-09-11 MED ORDER — DEXAMETHASONE SODIUM PHOSPHATE 10 MG/ML IJ SOLN
INTRAMUSCULAR | Status: AC
  Filled 2020-09-11: qty 1

## 2020-09-11 MED ORDER — HEPARIN SODIUM (PORCINE) 1000 UNIT/ML IJ SOLN
INTRAMUSCULAR | Status: DC | PRN
  Administered 2020-09-11: 6000 [IU] via INTRAVENOUS
  Administered 2020-09-11 (×2): 3000 [IU] via INTRAVENOUS

## 2020-09-11 MED ORDER — METOPROLOL TARTRATE 5 MG/5ML IV SOLN
2.0000 mg | INTRAVENOUS | Status: DC | PRN
  Administered 2020-09-14: 5 mg via INTRAVENOUS
  Filled 2020-09-11: qty 5

## 2020-09-11 MED ORDER — PROPOFOL 10 MG/ML IV BOLUS
INTRAVENOUS | Status: AC
  Filled 2020-09-11: qty 20

## 2020-09-11 MED ORDER — PROTAMINE SULFATE 10 MG/ML IV SOLN
INTRAVENOUS | Status: AC
  Filled 2020-09-11: qty 5

## 2020-09-11 MED ORDER — DEXTROSE 50 % IV SOLN
INTRAVENOUS | Status: AC
  Filled 2020-09-11: qty 50

## 2020-09-11 MED ORDER — SODIUM CHLORIDE 0.9 % IV SOLN
INTRAVENOUS | Status: DC | PRN
  Administered 2020-09-11: 09:00:00 500 mL

## 2020-09-11 MED ORDER — 0.9 % SODIUM CHLORIDE (POUR BTL) OPTIME
TOPICAL | Status: DC | PRN
  Administered 2020-09-11 (×3): 1000 mL

## 2020-09-11 MED ORDER — ONDANSETRON HCL 4 MG/2ML IJ SOLN
INTRAMUSCULAR | Status: DC | PRN
  Administered 2020-09-11: 4 mg via INTRAVENOUS

## 2020-09-11 MED ORDER — ROCURONIUM BROMIDE 10 MG/ML (PF) SYRINGE
PREFILLED_SYRINGE | INTRAVENOUS | Status: AC
  Filled 2020-09-11: qty 10

## 2020-09-11 MED ORDER — PHENYLEPHRINE 40 MCG/ML (10ML) SYRINGE FOR IV PUSH (FOR BLOOD PRESSURE SUPPORT)
PREFILLED_SYRINGE | INTRAVENOUS | Status: DC | PRN
  Administered 2020-09-11: 80 ug via INTRAVENOUS
  Administered 2020-09-11: 40 ug via INTRAVENOUS
  Administered 2020-09-11: 80 ug via INTRAVENOUS
  Administered 2020-09-11: 40 ug via INTRAVENOUS

## 2020-09-11 MED ORDER — OXYCODONE-ACETAMINOPHEN 5-325 MG PO TABS
1.0000 | ORAL_TABLET | ORAL | Status: DC | PRN
  Administered 2020-09-11: 1
  Filled 2020-09-11: qty 2

## 2020-09-11 MED ORDER — ONDANSETRON HCL 4 MG/2ML IJ SOLN
4.0000 mg | Freq: Four times a day (QID) | INTRAMUSCULAR | Status: DC | PRN
  Administered 2020-09-12 – 2020-09-18 (×3): 4 mg via INTRAVENOUS
  Filled 2020-09-11 (×3): qty 2

## 2020-09-11 MED ORDER — FENTANYL CITRATE (PF) 250 MCG/5ML IJ SOLN
INTRAMUSCULAR | Status: DC | PRN
  Administered 2020-09-11: 50 ug via INTRAVENOUS
  Administered 2020-09-11: 25 ug via INTRAVENOUS
  Administered 2020-09-11: 100 ug via INTRAVENOUS
  Administered 2020-09-11 (×5): 50 ug via INTRAVENOUS
  Administered 2020-09-11: 150 ug via INTRAVENOUS

## 2020-09-11 MED ORDER — STERILE WATER FOR IRRIGATION IR SOLN
Status: DC | PRN
  Administered 2020-09-11: 1000 mL

## 2020-09-11 MED ORDER — SUGAMMADEX SODIUM 200 MG/2ML IV SOLN
INTRAVENOUS | Status: DC | PRN
  Administered 2020-09-11: 200 mg via INTRAVENOUS

## 2020-09-11 MED ORDER — MORPHINE SULFATE (PF) 2 MG/ML IV SOLN
2.0000 mg | INTRAVENOUS | Status: DC | PRN
Start: 2020-09-11 — End: 2020-09-16
  Administered 2020-09-15 – 2020-09-16 (×4): 2 mg via INTRAVENOUS
  Filled 2020-09-11 (×4): qty 1

## 2020-09-11 MED ORDER — PROPOFOL 10 MG/ML IV BOLUS
INTRAVENOUS | Status: DC | PRN
  Administered 2020-09-11: 140 mg via INTRAVENOUS

## 2020-09-11 MED ORDER — ROCURONIUM BROMIDE 10 MG/ML (PF) SYRINGE
PREFILLED_SYRINGE | INTRAVENOUS | Status: DC | PRN
  Administered 2020-09-11 (×2): 30 mg via INTRAVENOUS
  Administered 2020-09-11: 20 mg via INTRAVENOUS
  Administered 2020-09-11: 30 mg via INTRAVENOUS
  Administered 2020-09-11: 20 mg via INTRAVENOUS
  Administered 2020-09-11: 50 mg via INTRAVENOUS
  Administered 2020-09-11: 20 mg via INTRAVENOUS

## 2020-09-11 MED ORDER — SODIUM CHLORIDE 0.9 % IV SOLN: 500.0000 mL | Freq: Once | INTRAVENOUS | Status: DC | PRN

## 2020-09-11 MED ORDER — SODIUM CHLORIDE 0.9 % IV SOLN: INTRAVENOUS | Status: DC

## 2020-09-11 MED ORDER — ONDANSETRON HCL 4 MG/2ML IJ SOLN
4.0000 mg | Freq: Once | INTRAMUSCULAR | Status: AC
  Administered 2020-09-11: 4 mg via INTRAVENOUS

## 2020-09-11 MED ORDER — MIDAZOLAM HCL 5 MG/5ML IJ SOLN
INTRAMUSCULAR | Status: DC | PRN
  Administered 2020-09-11 (×2): 1 mg via INTRAVENOUS

## 2020-09-11 MED ORDER — SENNA 8.6 MG PO TABS: 1.0000 | ORAL_TABLET | Freq: Every day | ORAL | Status: DC

## 2020-09-11 SURGICAL SUPPLY — 70 items
BAG ISL DRAPE 18X18 STRL (DRAPES) ×2
BAG ISOLATION DRAPE 18X18 (DRAPES) IMPLANT
BANDAGE ESMARK 6X9 LF (GAUZE/BANDAGES/DRESSINGS) IMPLANT
BNDG ESMARK 6X9 LF (GAUZE/BANDAGES/DRESSINGS)
CANISTER SUCT 3000ML PPV (MISCELLANEOUS) ×3 IMPLANT
CANNULA VESSEL 3MM 2 BLNT TIP (CANNULA) ×6 IMPLANT
CLIP LIGATING EXTRA MED SLVR (CLIP) ×4 IMPLANT
CLIP LIGATING EXTRA SM BLUE (MISCELLANEOUS) ×4 IMPLANT
COVER BACK TABLE 60X90IN (DRAPES) ×3 IMPLANT
CUFF TOURN SGL QUICK 34 (TOURNIQUET CUFF)
CUFF TOURN SGL QUICK 42 (TOURNIQUET CUFF) IMPLANT
CUFF TRNQT CYL 34X4.125X (TOURNIQUET CUFF) IMPLANT
DERMABOND ADVANCED (GAUZE/BANDAGES/DRESSINGS) ×2
DERMABOND ADVANCED .7 DNX12 (GAUZE/BANDAGES/DRESSINGS) ×4 IMPLANT
DRAIN SNY 10X20 3/4 PERF (WOUND CARE) IMPLANT
DRAPE CV SPLIT W-CLR ANES SCRN (DRAPES) IMPLANT
DRAPE HALF SHEET 40X57 (DRAPES) IMPLANT
DRAPE ISOLATION BAG 18X18 (DRAPES) ×6
DRAPE PERI GROIN 82X75IN TIB (DRAPES) IMPLANT
DRAPE X-RAY CASS 24X20 (DRAPES) IMPLANT
ELECT BLADE 4.0 EZ CLEAN MEGAD (MISCELLANEOUS)
ELECT REM PT RETURN 9FT ADLT (ELECTROSURGICAL) ×3
ELECTRODE BLDE 4.0 EZ CLN MEGD (MISCELLANEOUS) IMPLANT
ELECTRODE REM PT RTRN 9FT ADLT (ELECTROSURGICAL) ×2 IMPLANT
EVACUATOR SILICONE 100CC (DRAIN) IMPLANT
FELT TEFLON 1X6 (MISCELLANEOUS) ×1 IMPLANT
GLOVE SS BIOGEL STRL SZ 7.5 (GLOVE) ×2 IMPLANT
GLOVE SUPERSENSE BIOGEL SZ 7.5 (GLOVE) ×1
GOWN STRL REUS W/ TWL LRG LVL3 (GOWN DISPOSABLE) ×6 IMPLANT
GOWN STRL REUS W/TWL LRG LVL3 (GOWN DISPOSABLE) ×9
GRAFT HEMASHIELD 14X8MM (Vascular Products) ×1 IMPLANT
INSERT FOGARTY 61MM (MISCELLANEOUS) ×3 IMPLANT
INSERT FOGARTY SM (MISCELLANEOUS) ×6 IMPLANT
KIT BASIN OR (CUSTOM PROCEDURE TRAY) ×3 IMPLANT
KIT TURNOVER KIT B (KITS) ×3 IMPLANT
NS IRRIG 1000ML POUR BTL (IV SOLUTION) ×6 IMPLANT
PACK AORTA (CUSTOM PROCEDURE TRAY) ×3 IMPLANT
PACK PERIPHERAL VASCULAR (CUSTOM PROCEDURE TRAY) ×3 IMPLANT
PAD ARMBOARD 7.5X6 YLW CONV (MISCELLANEOUS) ×6 IMPLANT
PADDING CAST COTTON 6X4 STRL (CAST SUPPLIES) IMPLANT
SET COLLECT BLD 21X3/4 12 (NEEDLE) IMPLANT
SPONGE LAP 18X18 RF (DISPOSABLE) IMPLANT
STOPCOCK 4 WAY LG BORE MALE ST (IV SETS) IMPLANT
SUT ETHIBOND 5 LR DA (SUTURE) IMPLANT
SUT ETHILON 3 0 PS 1 (SUTURE) IMPLANT
SUT PDS AB 1 TP1 54 (SUTURE) ×6 IMPLANT
SUT PROLENE 3 0 SH1 36 (SUTURE) ×3 IMPLANT
SUT PROLENE 5 0 C 1 24 (SUTURE) ×13 IMPLANT
SUT PROLENE 5 0 C 1 36 (SUTURE) IMPLANT
SUT PROLENE 6 0 CC (SUTURE) ×8 IMPLANT
SUT SILK 2 0 (SUTURE) ×3
SUT SILK 2 0 SH (SUTURE) ×3 IMPLANT
SUT SILK 2 0 SH CR/8 (SUTURE) ×3 IMPLANT
SUT SILK 2-0 18XBRD TIE 12 (SUTURE) ×2 IMPLANT
SUT SILK 3 0 (SUTURE) ×6
SUT SILK 3-0 18XBRD TIE 12 (SUTURE) ×2 IMPLANT
SUT SILK 4 0 (SUTURE) ×3
SUT SILK 4-0 18XBRD TIE 12 (SUTURE) IMPLANT
SUT VIC AB 2-0 CT1 27 (SUTURE) ×12
SUT VIC AB 2-0 CT1 36 (SUTURE) ×6 IMPLANT
SUT VIC AB 2-0 CT1 TAPERPNT 27 (SUTURE) IMPLANT
SUT VIC AB 2-0 CTX 36 (SUTURE) ×6 IMPLANT
SUT VIC AB 3-0 SH 27 (SUTURE) ×27
SUT VIC AB 3-0 SH 27X BRD (SUTURE) ×8 IMPLANT
TOWEL GREEN STERILE (TOWEL DISPOSABLE) ×3 IMPLANT
TOWEL SURG RFD BLUE STRL DISP (DISPOSABLE) ×6 IMPLANT
TRAY FOLEY MTR SLVR 16FR STAT (SET/KITS/TRAYS/PACK) ×3 IMPLANT
TUBING EXTENTION W/L.L. (IV SETS) IMPLANT
UNDERPAD 30X36 HEAVY ABSORB (UNDERPADS AND DIAPERS) ×3 IMPLANT
WATER STERILE IRR 1000ML POUR (IV SOLUTION) ×6 IMPLANT

## 2020-09-11 NOTE — Anesthesia Procedure Notes (Signed)
Procedure Name: Intubation Date/Time: 09/11/2020 8:05 AM Performed by: Harden Mo, CRNA Pre-anesthesia Checklist: Patient identified, Emergency Drugs available, Suction available and Patient being monitored Patient Re-evaluated:Patient Re-evaluated prior to induction Oxygen Delivery Method: Circle system utilized Preoxygenation: Pre-oxygenation with 100% oxygen Induction Type: IV induction Ventilation: Mask ventilation without difficulty and Oral airway inserted - appropriate to patient size Laryngoscope Size: Mac and 4 Grade View: Grade II Tube type: Oral Tube size: 7.5 mm Number of attempts: 1 Airway Equipment and Method: Stylet and Oral airway Placement Confirmation: ETT inserted through vocal cords under direct vision,  positive ETCO2 and breath sounds checked- equal and bilateral Secured at: 23 cm Tube secured with: Tape Dental Injury: Teeth and Oropharynx as per pre-operative assessment  Comments: Intubated by Paulina Fusi, SRNA

## 2020-09-11 NOTE — Progress Notes (Signed)
Subjective:  Patient seen in PACU following aorta bifemoral and right fem-pop bypass. Doing well, no concerns at present. Patient to transition to 2H.   Objective:  Vital signs in last 24 hours: Vitals:   09/11/20 1500 09/11/20 1504 09/11/20 1515 09/11/20 1530  BP: (!) 142/69 (!) 149/74  122/65  Pulse: 62 62 63 66  Resp: 15 (!) 9 14 17   Temp:      TempSrc:      SpO2: 100% 100% 100% 100%  Weight:      Height:       Weight change:   Intake/Output Summary (Last 24 hours) at 09/11/2020 1611 Last data filed at 09/11/2020 1415 Gross per 24 hour  Intake 4000 ml  Output 4435 ml  Net -435 ml   Physical Exam Constitutional:      General: [name] awake.     Appearance: [name] underweight.     Comments: Pleasant, thin appearing male seen lying in bed. NAD, lethargic, but easily aroused and conversant.   Cardiovascular:     Rate and Rhythm: Normal rate and regular rhythm.  Musculoskeletal:     Comments: Hemostatic incisions  Neurological:     Mental Status: [name] alert.     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   Arterial Disease with Infrarenal Aortic Occlusion, Bilateral Common and Femoral Occlusions  Dry Gangrene with Moderate Cellulitis RLE Superficial Femoral Artery Occlusions -Bilateral No systemic signs or symptomsof infection.  Worsening dry gangrenous ulcerations for the past month. Signs of cellulitis extending to lower calf without fluctuance or crepitus. Remains afebrile with normal WBC, and clinical stability. MRSA positive per Nasopharyngeal PCR. Patient pain improving on current regimine.  -Orthopedics and Vascular following -s/p revascularization today(aortobifemoral bypass and right fem pop bypass) -Hopeful for limbsalvage however high risk for amputation after revascularization given extensive disease -Continue Vancomycin per pharmacy -Continue ceftriaxone 2g qd  - will need PT/OT after definitive treatment  -f/ublood cultures -morphine 2mg  q2 prn,percocet q4prn, scheduled Tylenol BID  Latent Autoimmune Diabetes Endorses using lantus 32U qam and novolog 3-4U tid with meals. Symptomatic hypoglycemia at 66 this AM that resolved with juice.   -SSI and qhs ssi -decreased lantus to 12 units -Novolog 0-6   Chronic Hyponatremia Chronic. Possibly 2/2 tea and toast diet. Patient is very thin with BMI 18 and albumin 2.9. Chart reviewed and there is no recent weight loss.Sodium improving, corrected sodium 135 today. Hyponatremia workup in June 2021 showed low urine sodium and appropriate urine osmolality. Urine osmolality during this admission appropriate, urine sodium 120 (appropriate given low-normal corrected sodium). Primary adrenal insufficiency unlikely given normal early morning cortisol; and hypertension.   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 BP remained elevated on home ramipril.  Cardiology recommending low dose beta blocker for perioperative management, in light of echo  showing anteroseptal wall abnormalities consistent with age-indeterminate prior MI. Newly bradycardic in 50s per monitor, so holding off on beta blocker per  additional Cardiology rec. Persistent HTN on increased ramipril dose of 10mg . Given Hydral for significantly elevated bp overnight. Bradycardia normalizing.  -Continue increased dose ramipril10mg  -Start B-blocker as able   Anxiety ContinueXanax 1 mg tid prn   LOS: 3 days   , Medical Student 09/11/2020, 4:11 PM

## 2020-09-11 NOTE — Interval H&P Note (Signed)
History and Physical Interval Note:  09/11/2020 7:27 AM  Peter Mendez.  has presented today for surgery, with the diagnosis of PVD.  The various methods of treatment have been discussed with the patient and family. After consideration of risks, benefits and other options for treatment, the patient has consented to  Procedure(s): AORTA BIFEMORAL BYPASS GRAFT (Bilateral) RIGHT FEMORAL-POPLITEAL ARTERY BYPASS GRAFTING (Right) as a surgical intervention.  The patient's history has been reviewed, patient examined, no change in status, stable for surgery.  I have reviewed the patient's chart and labs.  Questions were answered to the patient's satisfaction.     Gretta Began

## 2020-09-11 NOTE — Anesthesia Procedure Notes (Signed)
Central Venous Catheter Insertion Performed by: Dorris Singh, MD, anesthesiologist Start/End2/11/2020 7:15 AM, 09/11/2020 7:35 AM Preanesthetic checklist: patient identified, IV checked, site marked, risks and benefits discussed, surgical consent, monitors and equipment checked, pre-op evaluation and timeout performed Position: Trendelenburg Lidocaine 1% used for infiltration and patient sedated Hand hygiene performed , maximum sterile barriers used  and Seldinger technique used Central line was placed.Double lumen Procedure performed using ultrasound guided technique. Ultrasound Notes:anatomy identified and image(s) printed for medical record Attempts: 1 Following insertion, line sutured, dressing applied and Biopatch. Post procedure assessment: blood return through all ports  Patient tolerated the procedure well with no immediate complications.

## 2020-09-11 NOTE — Anesthesia Procedure Notes (Signed)
Arterial Line Insertion Start/End2/11/2020 7:20 AM, 09/11/2020 7:28 AM Performed by: Nils Pyle, CRNA, CRNA  Preanesthetic checklist: patient identified, IV checked, site marked, risks and benefits discussed, surgical consent, monitors and equipment checked, pre-op evaluation and anesthesia consent Lidocaine 1% used for infiltration and patient sedated Left, radial was placed Catheter size: 20 G Hand hygiene performed  and maximum sterile barriers used  Allen's test indicative of satisfactory collateral circulation Attempts: 1 Procedure performed without using ultrasound guided technique. Ultrasound Notes:anatomy identified, needle tip was noted to be adjacent to the nerve/plexus identified and no ultrasound evidence of intravascular and/or intraneural injection Following insertion, dressing applied and Biopatch. Post procedure assessment: normal  Patient tolerated the procedure well with no immediate complications.

## 2020-09-11 NOTE — Transfer of Care (Signed)
Immediate Anesthesia Transfer of Care Note  Patient: Peter Mendez.  Procedure(s) Performed: AORTA BIFEMORAL BYPASS GRAFT (Bilateral Abdomen) RIGHT FEMORAL-POPLITEAL ARTERY BYPASS GRAFTING (Right Leg Upper)  Patient Location: PACU  Anesthesia Type:General  Level of Consciousness: awake and alert   Airway & Oxygen Therapy: Patient Spontanous Breathing and Patient connected to face mask oxygen  Post-op Assessment: Report given to RN and Post -op Vital signs reviewed and stable  Post vital signs: Reviewed and stable  Last Vitals:  Vitals Value Taken Time  BP 120/81 09/11/20 1434  Temp    Pulse 73 09/11/20 1437  Resp 23 09/11/20 1437  SpO2 100 % 09/11/20 1437  Vitals shown include unvalidated device data.  Last Pain:  Vitals:   09/11/20 0456  TempSrc: Oral  PainSc:       Patients Stated Pain Goal: 3 (09/10/20 2153)  Complications: No complications documented.

## 2020-09-11 NOTE — Anesthesia Postprocedure Evaluation (Signed)
Anesthesia Post Note  Patient: Peter Mendez.  Procedure(s) Performed: AORTA BIFEMORAL BYPASS GRAFT (Bilateral Abdomen) RIGHT FEMORAL-POPLITEAL ARTERY BYPASS GRAFTING (Right Leg Upper)     Patient location during evaluation: PACU Anesthesia Type: General Level of consciousness: awake Pain management: pain level controlled Vital Signs Assessment: post-procedure vital signs reviewed and stable Respiratory status: spontaneous breathing Cardiovascular status: stable Postop Assessment: no apparent nausea or vomiting Anesthetic complications: no   No complications documented.  Last Vitals:  Vitals:   09/11/20 1515 09/11/20 1530  BP:  122/65  Pulse: 63 66  Resp: 14 17  Temp:    SpO2: 100% 100%    Last Pain:  Vitals:   09/11/20 1530  TempSrc:   PainSc: Asleep                 Johniece Hornbaker

## 2020-09-11 NOTE — Anesthesia Preprocedure Evaluation (Addendum)
Anesthesia Evaluation  Patient identified by MRN, date of birth, ID band Patient awake    Reviewed: Allergy & Precautions, NPO status , Patient's Chart, lab work & pertinent test results  Airway Mallampati: II  TM Distance: >3 FB     Dental  (+) Dental Advisory Given, Edentulous Upper, Edentulous Lower   Pulmonary Current Smoker and Patient abstained from smoking.,    breath sounds clear to auscultation       Cardiovascular hypertension, + Peripheral Vascular Disease   Rhythm:Regular Rate:Normal     Neuro/Psych PSYCHIATRIC DISORDERS Anxiety  Neuromuscular disease    GI/Hepatic negative GI ROS, Neg liver ROS,   Endo/Other  diabetes  Renal/GU      Musculoskeletal   Abdominal   Peds  Hematology   Anesthesia Other Findings   Reproductive/Obstetrics                            Anesthesia Physical Anesthesia Plan  ASA: III  Anesthesia Plan: General   Post-op Pain Management:    Induction: Intravenous  PONV Risk Score and Plan: 2 and Ondansetron, Dexamethasone and Midazolam  Airway Management Planned:   Additional Equipment: Arterial line, Ultrasound Guidance Line Placement and PA Cath  Intra-op Plan:   Post-operative Plan: Extubation in OR  Informed Consent: I have reviewed the patients History and Physical, chart, labs and discussed the procedure including the risks, benefits and alternatives for the proposed anesthesia with the patient or authorized representative who has indicated his/her understanding and acceptance.     Dental advisory given  Plan Discussed with: CRNA and Anesthesiologist  Anesthesia Plan Comments:         Anesthesia Quick Evaluation

## 2020-09-11 NOTE — Op Note (Signed)
OPERATIVE REPORT  DATE OF SURGERY: 09/11/2020  PATIENT: Peter Newness., 60 y.o. 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  PHYSICIAN ASSISTANT: Setzer PA C  The assistant was needed for exposure and to expedite the case  ANESTHESIA: General  EBL: per anesthesia record  Total I/O In: 3880 [I.V.:2800; Blood:130; IV Piggyback:950] Out: 3635 [Urine:3235; Blood:400]  BLOOD ADMINISTERED: none  DRAINS: none  SPECIMEN: none  COUNTS CORRECT:  YES  PATIENT DISPOSITION:  PACU - hemodynamically stable  PROCEDURE DETAILS: Patient was taken operating placed and was read area of the abdomen both groins and both legs were prepped and draped you sterile fashion.  Groin incisions were made bilaterally over the common femoral artery.  The external iliac, common femoral, superficial femoral artery and profunda femoris arteries were encircled bilaterally.  The right great saphenous vein was identified at the saphenofemoral junction.  SonoSite was used to mark the location of the saphenous vein which was of good caliber.  The great saphenous vein was harvested from the groin to the mid calf using several skip incisions.  Tributary branches were ligated with 3-0 and 4 silk ties and divided.  The vein was ligated distally and divided.  The below-knee popliteal artery was exposed to the medial vein harvest incision and was of good caliber with minimal atherosclerotic change.  Attention was then turned to the abdomen.  Incision was made from the xiphoid to below the umbilicus and carried down through the midline fascia with electrocautery.  The  peritoneum was entered.  The small and large bowel were normal the liver and gallbladder were normal.  The Omni-Tract retractor was used for exposure.  The transverse colon and omentum were reflected superiorly and the small bowel was reflected to the right.  The retroperitoneum was opened.  The left renal vein was mobilized circumferentially.  The left adrenal vein was ligated and divided for better mobilization.  The aorta was exposed above the level of the renal arteries and the renal arteries were exposed bilaterally.  The infrarenal aorta was encircled forward eventual anastomosis.  Tunnels were created from the level of the aortic bifurcation to the groins bilaterally and umbilical tapes were passed through these for eventual graft tunneling.  Care was taken to pass behind the level of the ureters bilaterally.  The patient was given 8000 units of intravenous heparin after adequate circulation time the renal arteries were occluded with Serafin clamps and the suprarenal aorta was occluded with a Zanger clamp.  The aorta was transected below the level of the renal arteries.  The aorta was completely occluded.  The thrombus was removed and debris was removed up to the level of the renal arteries giving good flow channel.  The Harken clamp was placed below the renal arteries after the renal arteries were flushed.  The suprarenal clamp was removed overflowing stored to the kidneys.  The aorta was transected distally and was oversewn with 3-0 Prolene sutures.  A 14 x 8 Hemashield graft was brought onto the field was cut to the appropriate dimension and using a felt strip for reinforcement was signed into into the aorta below the level of the renal arteries with a running  3-0 Prolene suture.  This anastomosis was tested and found to be adequate.  The right and left limbs of the graft were then brought down to the respective common femoral arteries.  The external iliac, superficial femoral and profundus femoris  arteries were occluded bilaterally.  The common femoral arteries were opened 11 Layson ulcerating with Potts scissors.  The right and left limbs of the graft was spatulated and sewn end-to-side to the common femoral artery with running 5-0 Prolene sutures bilaterally.  The usual flushing maneuvers were undertaken prior to completion of each anastomosis.  Anastomosis completed giving flow respiration to this level.  Next the saphenous vein which had been prior harvested was brought onto the field.  The vein was placed in a nonreversed position.  A tunnel was created from the below-knee popliteal artery to the groin.  The right limb of the graft was reoccluded and the common superficial femoral and profundus femoris arteries were occluded.  An ellipse of graft was removed at the hood of the right limb of the aortofemoral graft.  The vein was spatulated and sewn end-to-side to the graft with a running 6-0 Prolene suture.  This anastomosis was tested and found to be adequate.  Next the [employer] was used to lyse the vein valves giving excellent flow through the vein graft.  The vein graft was brought through the prior created tunnel down to the level of the below-knee popliteal artery.  The below-knee popliteal artery was occluded proximally distally and was opened with 11 blade similar to Pott scissors.  The vein was cut to the appropriate length and was sewn end-to-side to the artery with a running 6-0 Prolene suture.  Prior to completion of the anastomosis clamps removed with the usual flushing maneuvers.  3 dilator passed easily through the distal anastomosis.  Anastomosis was completed clamps removed with excellent graft dependent flow.  The wounds irrigated with saline.  The patient was given 50 mg of protamine reverse heparin.  Wounds irrigated with saline.  Hemostasis left cautery wounds were closed with 2-0 Vicryl in the retroperitoneum over the graft.  The transverse colon and omentum were placed back  over the small bowel.  The small bowel was run its entirety and found to be without injury.  Midline fascia was closed with #1 PDS suture beginning proximally distally and tying in the middle.  Skin was closed with 3-0 subcuticular Vicryl stitch.  The groin wounds and the popliteal incision were closed with 2-0 Vicryl and the skin was closed with 3-0 subcuticular Vicryl stitch.  Patient was extubated in the operating room transferred to the recovery in stable condition   Larina Earthly, M.D., [facility] 09/11/2020 3:17 PM

## 2020-09-11 NOTE — Consult Note (Signed)
NAME:  Peter Mendez., MRN:  [id number], DOB:  Oct 16, 1960, LOS: 3 ADMISSION DATE:  09/07/2020, CONSULTATION DATE:  09/11/20 REFERRING MD:  Danise Edge, CHIEF COMPLAINT:  Critical limb ischemia: Post op ICU management    Brief History:  60 yo POD 0 aortobifem bypass graft, R fem pop bypass due to critical limb ischemia. Taken to ICU post op, PCCM asked to take over as primary from IMTS.   History of Present Illness:  60 yo M PMH PVD, RLE dry gangrene, neuropathy, autoimmune diabetes, tobacco use, HTN, vision impairment  presented to ED 2/1 with CC RLE pain and wounds. Patient was referred to ED by Ortho (where he was followed for small superficial gangrenous R heel ulcer), as concerns for critical limb ischemia arose during OP ortho appointment due to rapid progression of RLE gangrene, pain. Pt admitted to IMTS, ortho and VVS consulted 2/1.   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 High grade stenosis at junction of inferior epigastric artery and common fem arteries bilaterally. R superficial fem artery occlusion, heavily diseased L superficial fem artery with near occlusion at multiple foci.   Taken to OR with VVS 2/4 for aortobifem bypass grafting, R fem to below knee pop bypass.  Transferred to ICU post-op, PCCM consulted in this setting Past Medical History:  Tobacco use PVD DM  HTN Gangrene Prior MI Anxiety Hyponatremia Significant Hospital Events:  2/1 admitted to IMTS w RLE gangrene + concern for critical limb ischemia, ortho and vascular consulted. CTA with occlusive disease  2/4 OR with VVS. ICU after case. PCCM consulted  Consults:  Ortho VVS PCCM  Procedures:    Significant Diagnostic Tests:  2/1 CTA : occluded infrarenal abdominal aorta and bilateral common and external iliac arteries. High grade stenosis at junction of inferior epigastric artery and common fem arteries bilaterally. R superficial fem artery occlusion,  heavily diseased L superficial fem artery with near occlusion at multiple foci.   Micro Data:  2/1 covid neg  2/1 MRSA +  2/2 BCx >>   Antimicrobials:  vanc rocephin  Interim History / Subjective:  Transferred to ICU post op for monitoring  Objective   Blood pressure (!) 136/59, pulse (!) 54, temperature (!) 97.2 F (36.2 C), resp. rate 16, height 5\' 11"  (1.803 m), weight 59.9 kg, SpO2 98 %.        Intake/Output Summary (Last 24 hours) at 09/11/2020 1644 Last data filed at 09/11/2020 1605 Gross per 24 hour  Intake 4150 ml  Output 4800 ml  Net -650 ml   Filed Weights   09/08/20 0731 09/09/20 1100  Weight: 59.9 kg 59.9 kg    Examination: General: Chronically ill very thin elderl male lying in bed in NAD HEENT: [place]/AT, MM pink/moist, PERRL,  Neuro: Alert and oriented, able to follow simple commands  CV: s1s2 regular rate and rhythm, no murmur, rubs, or gallops,  PULM:  Clear to ascultation bilaterally no added breath sounds, no increased work of breathign GI: soft, bowel sounds active in all 4 quadrants, non-tender, non-distended, NG tube in place  Extremities: warm/dry, no edema  Skin:      Resolved Hospital Problem list     Assessment & Plan:  Critical limb right limb ischemia right leg  Dry gangrene of right foot  PAD  -Underwent aortobifemoral bypass with hemashield graft, right femoral to below-knee popliteal bypass with translocated nonreversed right great saphenous vein P: Primary management per vascular  Wound care Pain  management  Mobilize as able  Frequent neurovascular checks   Hx of HTN Hx of HLD -Home medications include Ramipril and crestor  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 -ECHO revealed akinetic segment of mild to distal anteroseptal wall  P: Cardiology following  Start beta blocker as able  Continuous telemetry  Continue home meds   Chronic hyponatremia  -Na ranges from 127-132 at baseline  P: Na currently stable  Trend Bmet   Insulin  dependent diabetes  -Home medications include Lantus  P: SSI  Continue long acting insulin  CBG checks q1hr CBG goal 140-180   Best practice (evaluated daily)  Diet:  Pain/Anxiety/Delirium protocol (if indicated): PRN  VAP protocol (if indicated): na DVT prophylaxis: per vvs GI prophylaxis: na Glucose control: SSI Mobility: BR Disposition:ICU   Goals of Care:  Last date of multidisciplinary goals of care discussion:-- Family and staff present: -- Summary of discussion: - Follow up goals of care discussion due: 2/11 Code Status: Full  Labs   CBC: Recent Labs  Lab 09/07/20 1756 09/09/20 0550 09/10/20 0849 09/11/20 0604 09/11/20 0857 09/11/20 0901 09/11/20 1425  WBC 10.0 5.3 8.6 7.6  --   --  8.8  NEUTROABS 8.6* 3.1  --   --   --   --   --   HGB 13.0 13.2 12.1* 12.5* 11.2* 11.2* 11.6*  HCT 37.7* 41.0 36.9* 37.2* 33.0* 33.0* 35.0*  MCV 84.3 87.2 87.0 85.7  --   --  86.6  PLT 239 197 220 243  --   --  186    Basic Metabolic Panel: Recent Labs  Lab 09/08/20 1323 09/09/20 0550 09/10/20 0849 09/11/20 0604 09/11/20 0857 09/11/20 0901 09/11/20 1425  NA 128* 133* 134* 134* 134* 134* 132*  K 4.1 3.4* 4.5 3.8 3.9 3.9 3.9  CL 92* 95* 96* 93*  --  94* 97*  CO2 27 24 28 28   --   --  23  GLUCOSE 206* 54* 212* 78  --  150* 229*  BUN <5* 7* 9 8  --  7* 6*  CREATININE 0.59* 0.65 0.69 0.67  --  0.30* 0.61  CALCIUM 8.3* 8.1* 8.7* 8.8*  --   --  8.5*  MG  --  1.8 1.9  --   --   --  1.6*   GFR: Estimated Creatinine Clearance: 82.2 mL/min (by C-G formula based on SCr of 0.61 mg/dL). Recent Labs  Lab 09/07/20 1756 09/08/20 0712 09/09/20 0550 09/10/20 0849 09/11/20 0604 09/11/20 1425  WBC 10.0  --  5.3 8.6 7.6 8.8  LATICACIDVEN 1.9 1.1  --   --   --   --     Liver Function Tests: Recent Labs  Lab 09/07/20 1756 09/09/20 0550 09/10/20 0849 09/11/20 0604  AST 17 16 19 27   ALT 10 7 9 12   ALKPHOS 112 89 82 101  BILITOT 1.0 0.7 0.7 0.5  PROT 6.9 5.9* 6.2* 6.5   ALBUMIN 2.9* 2.5* 2.6* 2.7*   No results for input(s): LIPASE, AMYLASE in the last 168 hours. No results for input(s): AMMONIA in the last 168 hours.  ABG    Component Value Date/Time   PHART 7.403 09/11/2020 1423   PCO2ART 40.3 09/11/2020 1423   PO2ART 190 (H) 09/11/2020 1423   HCO3 24.9 09/11/2020 1423   TCO2 28 09/11/2020 0901   O2SAT 99.2 09/11/2020 1423     Coagulation Profile: Recent Labs  Lab 09/11/20 1425  INR 1.2    Cardiac Enzymes: No results for  input(s): CKTOTAL, CKMB, CKMBINDEX, TROPONINI in the last 168 hours.  HbA1C: Hemoglobin A1C  Date/Time Value Ref Range Status  07/21/2020 01:19 PM 8.8 (A) 4.0 - 5.6 % Final  04/28/2020 01:34 PM 9.3 (A) 4.0 - 5.6 % Final   Hgb A1c MFr Bld  Date/Time Value Ref Range Status  06/26/2018 10:07 AM 8.1 (H) 4.6 - 6.5 % Final    Comment:    Glycemic Control Guidelines for People with Diabetes:Non Diabetic:  <6%Goal of Therapy: <7%Additional Action Suggested:  >8%   12/27/2017 04:34 PM 7.8 (H) 4.6 - 6.5 % Final    Comment:    Glycemic Control Guidelines for People with Diabetes:Non Diabetic:  <6%Goal of Therapy: <7%Additional Action Suggested:  >8%     CBG: Recent Labs  Lab 09/10/20 2049 09/11/20 0046 09/11/20 0642 09/11/20 0702 09/11/20 1438  GLUCAP 119* 144* 70 68* 191*    Review of Systems:   Limited, drowsy following anesthetic  +lethargy + pain  Past Medical History:  He,  has a past medical history of Chicken pox, Claudication (HCC), Diabetes 1.5, managed as type 1 (HCC), Hypertension, Low back pain, PAD (peripheral artery disease) (HCC), and Tobacco abuse.   Surgical History:   Past Surgical History:  Procedure Laterality Date  . CATARACT EXTRACTION Bilateral   . INTRAOCULAR LENS INSERTION Bilateral   . RETINAL DETACHMENT SURGERY Bilateral      Social History:   reports that he has been smoking cigarettes. He has a 31.50 pack-year smoking history. He has never used smokeless tobacco. He reports  previous drug use. He reports that he does not drink alcohol.   Family History:  His family history includes Arthritis in his father; Atrial fibrillation in his mother; Diabetes in his maternal grandmother, maternal uncle, and mother; Esophageal cancer in his maternal grandfather; Heart attack in his father, maternal grandmother, and sister; Heart disease in his father and maternal grandmother; Stroke in his father.   Allergies Allergies  Allergen Reactions  . Pravastatin Sodium Other (See Comments)    myalgia     Home Medications  Prior to Admission medications   Medication Sig Start Date End Date Taking? Authorizing Provider  acetaminophen (TYLENOL) 500 MG tablet Take 1,000 mg by mouth every 6 (six) hours as needed for mild pain or headache.   Yes [provider]  alprazolam Prudy Feeler) 2 MG tablet take 1 tablet by mouth twice a day if needed for anxiety Patient taking differently: Take 1 mg by mouth 3 (three) times daily as needed for anxiety. 11/21/16  Yes Reather Littler, MD  Continuous Blood Gluc Sensor (FREESTYLE LIBRE 14 DAY SENSOR) MISC 1 Units by Does not apply route every 14 (fourteen) days. 04/28/20  Yes Reather Littler, MD  gabapentin (NEURONTIN) 300 MG capsule Take 1 capsule (300 mg total) by mouth 3 (three) times daily. 07/21/20  Yes Reather Littler, MD  insulin glargine (LANTUS SOLOSTAR) 100 UNIT/ML Solostar Pen INJECT 30 UNITS SUBCUTANEOUSLY IN THE MORNING AND 32 UNITS IN THE EVENING. Patient taking differently: Inject 32 Units into the skin daily. Injects 32 units into the skin every morning; injects extra 6-8 units at bedtime as needed when BG>200 mg/dl 1/93/79  Yes Reather Littler, MD  NOVOLOG FLEXPEN 100 UNIT/ML FlexPen INJECT 6 TO 10 UNITS BEFORE MEALS AS DIRECTED  (PEN EXPIRES 28 DAYS AFTER OPENED) Patient taking differently: Inject 4-6 Units into the skin 3 (three) times daily with meals. 03/12/20  Yes Reather Littler, MD  omeprazole (PRILOSEC) 20 MG capsule Take 20  mg by mouth daily.  08/26/20  Yes [provider]  oxyCODONE (ROXICODONE) 15 MG immediate release tablet Take 15 mg by mouth every 8 (eight) hours as needed for pain. 02/24/13  Yes [provider]  ramipril (ALTACE) 5 MG capsule TAKE 1 CAPSULE EVERY DAY Patient taking differently: Take 5 mg by mouth daily. 06/04/20  Yes Reather Littler, MD  rosuvastatin (CRESTOR) 5 MG tablet Take 1 tablet (5 mg total) by mouth daily. 04/17/19  Yes Reather Littler, MD  DROPLET PEN NEEDLES 31G X 8 MM MISC USE TO INJECT  INSULIN FIVE TIMES DAILY 06/04/20   Reather Littler, MD  Lancets [facility] ULTRASOFT) lancets Use as instructed to check blood sugars 7 times per day 09/19/13   Reather Littler, MD     Critical care: N/A  Delfin Gant, NP-C [facility] Personal contact information can be found on Amion  If no response please page: Adult pulmonary and critical care medicine pager on Amion unitl 7pm After 7pm please call [phone] 09/11/2020, 5:22 PM

## 2020-09-12 ENCOUNTER — Inpatient Hospital Stay (HOSPITAL_COMMUNITY): Payer: Medicare Other

## 2020-09-12 DIAGNOSIS — I998 Other disorder of circulatory system: Secondary | ICD-10-CM | POA: Diagnosis not present

## 2020-09-12 DIAGNOSIS — I779 Disorder of arteries and arterioles, unspecified: Secondary | ICD-10-CM | POA: Diagnosis present

## 2020-09-12 LAB — COMPREHENSIVE METABOLIC PANEL
ALT: 13 U/L (ref 0–44)
AST: 27 U/L (ref 15–41)
Albumin: 2.8 g/dL — ABNORMAL LOW (ref 3.5–5.0)
Alkaline Phosphatase: 72 U/L (ref 38–126)
Anion gap: 12 (ref 5–15)
BUN: 9 mg/dL (ref 8–23)
CO2: 24 mmol/L (ref 22–32)
Calcium: 8.4 mg/dL — ABNORMAL LOW (ref 8.9–10.3)
Chloride: 97 mmol/L — ABNORMAL LOW (ref 98–111)
Creatinine, Ser: 0.62 mg/dL (ref 0.61–1.24)
GFR, Estimated: >60 mL/min (ref >60)
Glucose, Bld: 262 mg/dL — ABNORMAL HIGH (ref 70–99)
Potassium: 3.5 mmol/L (ref 3.5–5.1)
Sodium: 133 mmol/L — ABNORMAL LOW (ref 135–145)
Total Bilirubin: 0.8 mg/dL (ref 0.3–1.2)
Total Protein: 5.6 g/dL — ABNORMAL LOW (ref 6.5–8.1)

## 2020-09-12 LAB — CBC
HCT: 30.1 % — ABNORMAL LOW (ref 39.0–52.0)
Hemoglobin: 10.6 g/dL — ABNORMAL LOW (ref 13.0–17.0)
MCH: 29.4 pg (ref 26.0–34.0)
MCHC: 35.2 g/dL (ref 30.0–36.0)
MCV: 83.6 fL (ref 80.0–100.0)
Platelets: 173 10*3/uL (ref 150–400)
RBC: 3.6 MIL/uL — ABNORMAL LOW (ref 4.22–5.81)
RDW: 13.8 % (ref 11.5–15.5)
WBC: 9 10*3/uL (ref 4.0–10.5)
nRBC: 0 % (ref 0.0–0.2)

## 2020-09-12 LAB — GLUCOSE, CAPILLARY
Glucose-Capillary: 253 mg/dL — ABNORMAL HIGH (ref 70–99)
Glucose-Capillary: 215 mg/dL — ABNORMAL HIGH (ref 70–99)
Glucose-Capillary: 240 mg/dL — ABNORMAL HIGH (ref 70–99)
Glucose-Capillary: 191 mg/dL — ABNORMAL HIGH (ref 70–99)
Glucose-Capillary: 130 mg/dL — ABNORMAL HIGH (ref 70–99)

## 2020-09-12 LAB — AMYLASE: Amylase: 12 U/L — ABNORMAL LOW (ref 28–100)

## 2020-09-12 LAB — MAGNESIUM: Magnesium: 2 mg/dL (ref 1.7–2.4)

## 2020-09-12 MED ORDER — RAMIPRIL 5 MG PO CAPS
10.0000 mg | ORAL_CAPSULE | Freq: Every day | ORAL | Status: DC
  Administered 2020-09-13 – 2020-09-15 (×3): 10 mg via ORAL
  Filled 2020-09-12 (×2): qty 1
  Filled 2020-09-12: qty 2

## 2020-09-12 MED ORDER — PANTOPRAZOLE SODIUM 40 MG PO TBEC
40.0000 mg | DELAYED_RELEASE_TABLET | Freq: Every day | ORAL | Status: DC
  Administered 2020-09-12 – 2020-09-25 (×14): 40 mg via ORAL
  Filled 2020-09-12 (×14): qty 1

## 2020-09-12 MED ORDER — ACETAMINOPHEN 325 MG PO TABS
650.0000 mg | ORAL_TABLET | Freq: Two times a day (BID) | ORAL | Status: DC
Start: 2020-09-12 — End: 2020-09-25
  Administered 2020-09-12 – 2020-09-24 (×20): 650 mg via ORAL
  Filled 2020-09-12 (×25): qty 2

## 2020-09-12 MED ORDER — INSULIN GLARGINE 100 UNIT/ML ~~LOC~~ SOLN
10.0000 [IU] | Freq: Two times a day (BID) | SUBCUTANEOUS | Status: DC
  Administered 2020-09-12 (×2): 10 [IU] via SUBCUTANEOUS
  Filled 2020-09-12 (×4): qty 0.1

## 2020-09-12 MED ORDER — ORAL CARE MOUTH RINSE
15.0000 mL | Freq: Two times a day (BID) | OROMUCOSAL | Status: DC
  Administered 2020-09-14 – 2020-09-23 (×11): 15 mL via OROMUCOSAL

## 2020-09-12 MED ORDER — SENNA 8.6 MG PO TABS
1.0000 | ORAL_TABLET | Freq: Every day | ORAL | Status: DC
  Administered 2020-09-13 – 2020-09-16 (×4): 8.6 mg via ORAL
  Filled 2020-09-12 (×4): qty 1

## 2020-09-12 MED ORDER — ACETAMINOPHEN 325 MG PO TABS: 325.0000 mg | ORAL_TABLET | ORAL | Status: DC | PRN

## 2020-09-12 MED ORDER — CHLORHEXIDINE GLUCONATE 0.12 % MT SOLN
15.0000 mL | Freq: Two times a day (BID) | OROMUCOSAL | Status: DC
  Administered 2020-09-13 – 2020-09-24 (×22): 15 mL via OROMUCOSAL
  Filled 2020-09-12 (×22): qty 15

## 2020-09-12 MED ORDER — GABAPENTIN 300 MG PO CAPS
300.0000 mg | ORAL_CAPSULE | Freq: Three times a day (TID) | ORAL | Status: DC
  Administered 2020-09-12 – 2020-09-25 (×40): 300 mg via ORAL
  Filled 2020-09-12 (×40): qty 1

## 2020-09-12 MED ORDER — ACETAMINOPHEN 325 MG RE SUPP
325.0000 mg | RECTAL | Status: DC | PRN
  Filled 2020-09-12: qty 2

## 2020-09-12 MED ORDER — OXYCODONE-ACETAMINOPHEN 5-325 MG PO TABS
1.0000 | ORAL_TABLET | ORAL | Status: DC | PRN
  Administered 2020-09-14: 2 via ORAL
  Administered 2020-09-14 (×2): 1 via ORAL
  Administered 2020-09-15 – 2020-09-16 (×4): 2 via ORAL
  Filled 2020-09-12 (×3): qty 2
  Filled 2020-09-12 (×2): qty 1
  Filled 2020-09-12 (×2): qty 2
  Filled 2020-09-12: qty 1

## 2020-09-12 NOTE — Progress Notes (Signed)
[facility]  Subjective  -no acute events in ICU overnight.   Objective (!) 149/66 84 98.8 F (37.1 C) (Oral) (!) 25 99%  Intake/Output Summary (Last 24 hours) at 09/12/2020 0947 Last data filed at 09/12/2020 0700 Gross per 24 hour  Intake 4115.99 ml  Output 3219 ml  Net 896.99 ml    Bilateral groin and midline incision clean dry and intact Right DP brisk by Doppler Left DP PT brisk by Doppler Stable tissue loss in the right lower extremity  Laboratory Lab Results: Recent Labs    09/11/20 1425 09/12/20 0500  WBC 8.8 9.0  HGB 11.6* 10.6*  HCT 35.0* 30.1*  PLT 186 173   BMET Recent Labs    09/11/20 1425 09/12/20 0500  NA 132* 133*  K 3.9 3.5  CL 97* 97*  CO2 23 24  GLUCOSE 229* 262*  BUN 6* 9  CREATININE 0.61 0.62  CALCIUM 8.5* 8.4*    COAG Lab Results  Component Value Date   INR 1.2 09/11/2020   No results found for: PTT  Assessment/Planning: POD #1 status post aortobifemoral bypass and a right common femoral to below-knee popliteal bypass with vein for CLI of the right lower extremity with tissue loss.  He has been stable in the ICU overnight.  Neuro: GCS 15, pain well controlled. CV: Hemodynamically stable.  Hemoglobin 10.6.  Will D/C A-line Pulmonary: Nasal cannula.   Heparin for DVT prophylaxis. GI: We will D/C NG tube.  No return of bowel function.  Keep NPO. Renal: IV fluids at 125 mL an hour.  Creatinine stable 0.62.  Urine output 815 mL overnight which is excellent.  Excellent Doppler signals in lower extremities and will have to watch how his right foot demarcates.  Dispo: DC NG tube and A-line and leave in ICU 1 more day.  Cephus Shelling 09/12/2020 9:47 AM --

## 2020-09-12 NOTE — Consult Note (Addendum)
This is a progress note    NAME:  Peter Szumski., MRN:  [id number], DOB:  Jan 09, 1961, LOS: 4 ADMISSION DATE:  09/07/2020, CONSULTATION DATE:  09/11/20 REFERRING MD:  Danise Edge, CHIEF COMPLAINT:  Critical limb ischemia: Post op ICU management    Brief History:  60 yo POD 0 aortobifem bypass graft, R fem pop bypass due to critical limb ischemia. Taken to ICU post op, PCCM asked to take over as primary from IMTS.   History of Present Illness:  60 yo M PMH PVD, RLE dry gangrene, neuropathy, autoimmune diabetes, tobacco use, HTN, vision impairment  presented to ED 2/1 with CC RLE pain and wounds. Patient was referred to ED by Ortho (where he was followed for small superficial gangrenous R heel ulcer), as concerns for critical limb ischemia arose during OP ortho appointment due to rapid progression of RLE gangrene, pain. Pt admitted to IMTS, ortho and VVS consulted 2/1.   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 High grade stenosis at junction of inferior epigastric artery and common fem arteries bilaterally. R superficial fem artery occlusion, heavily diseased L superficial fem artery with near occlusion at multiple foci.   Taken to OR with VVS 2/4 for aortobifem bypass grafting, R fem to below knee pop bypass.  Transferred to ICU post-op, PCCM consulted in this setting Past Medical History:  Tobacco use PVD DM  HTN Gangrene Prior MI Anxiety Hyponatremia Significant Hospital Events:  2/1 admitted to IMTS w RLE gangrene + concern for critical limb ischemia, ortho and vascular consulted. CTA with occlusive disease  2/4 OR with VVS. ICU after case. 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 High grade stenosis at junction of inferior epigastric artery and common fem arteries  bilaterally. R superficial fem artery occlusion, heavily diseased L superficial fem artery with near occlusion at multiple foci.   Micro Data:  2/1 covid neg  2/1 MRSA +  2/2 BCx >>   Antimicrobials:  vanc rocephin  Interim History / Subjective:  No events. To have NG and a line out. Wants ice chips.  Objective   Blood pressure (!) 149/66, pulse 84, temperature 98.8 F (37.1 C), temperature source Oral, resp. rate (!) 25, height 5\' 11"  (1.803 m), weight 57.1 kg, SpO2 99 %.        Intake/Output Summary (Last 24 hours) at 09/12/2020 11/10/2020 Last data filed at 09/12/2020 0700 Gross per 24 hour  Intake 5715.99 ml  Output 5069 ml  Net 646.99 ml   Filed Weights   09/08/20 0731 09/09/20 1100 09/12/20 0500  Weight: 59.9 kg 59.9 kg 57.1 kg    Examination: Constitutional: cachetic frail man in no acute distress  Eyes: EOMI, pupils equal Ears, nose, mouth, and throat: MM dry, NGT in place Cardiovascular: RRR, ext with dopplerable pulses Respiratory: clear, no accessory muscle use Gastrointestinal: hypoactive BS, soft Skin: stable wounds detailed in other notes Neurologic: moves all 4 ext to command Psychiatric: RASS 0  Sugars slightly up Cr looks good CBC stable  Resolved Hospital Problem list     Assessment & Plan:  Critical limb right limb ischemia right leg  Dry gangrene of right foot  PAD -Underwent aortobifemoral bypass with hemashield graft, right femoral to below-knee popliteal bypass with translocated nonreversed right great saphenous vein 09/11/20 Hx of HTN Hx  of HLD Chronic hyponatremia -Na ranges from 127-132 at baseline  Insulin dependent diabetes  Severe protein calorie malnutrition Muscular deconditioning  - NGT out, bowel rest today - Keep SBP 120-160 - Usual doppler checks of ext - Up to chair, IS - Watch renal function - Increase lantus to BID, goal CBG 100-180 - ICU watch 1 more day  Back to IMTS as primary once out of 2H  Myrla Halsted MD PCCM

## 2020-09-12 NOTE — Plan of Care (Signed)
  Problem: Education: Goal: Knowledge of General Education information will improve Description: Including pain rating scale, medication(s)/side effects and non-pharmacologic comfort measures Outcome: Progressing   Problem: Health Behavior/Discharge Planning: Goal: Ability to manage health-related needs will improve Outcome: Progressing   Problem: Clinical Measurements: Goal: Ability to maintain clinical measurements within normal limits will improve Outcome: Progressing Goal: Will remain free from infection Outcome: Progressing Goal: Diagnostic test results will improve Outcome: Progressing Goal: Cardiovascular complication will be avoided Outcome: Progressing   Problem: Activity: Goal: Risk for activity intolerance will decrease Outcome: Progressing   Problem: Pain Managment: Goal: General experience of comfort will improve Outcome: Progressing   Problem: Safety: Goal: Ability to remain free from injury will improve Outcome: Progressing   Problem: Skin Integrity: Goal: Risk for impaired skin integrity will decrease Outcome: Progressing   Problem: Cardiovascular: Goal: Vascular access site(s) Level 0-1 will be maintained Outcome: Progressing   Problem: Nutrition: Goal: Adequate nutrition will be maintained Outcome: Not Progressing

## 2020-09-12 NOTE — Evaluation (Addendum)
Physical Therapy Evaluation Patient Details Name: Peter Mendez. MRN: [id number] DOB: 1960-10-06 Today's Date: 09/12/2020   History of Present Illness  60 yo POD 0 aortobifem bypass graft, R fem pop bypass due to critical limb ischemia.PMH PVD, RLE dry gangrene, neuropathy, autoimmune diabetes, tobacco use, HTN, vision impairment  Clinical Impression  Pt admitted with above diagnosis. Pt was able to get OOB to chair with mod assist with posterior lean and decr safety overall.  Pt limited by pain.  Pt may need Rehab prior to d/c home as pt lives with mom who is 71 years old and question whether she can care for pt unless he is min guard assist at d/c or may need to use wheelchair at times. Will follow acutely.  Pt currently with functional limitations due to the deficits listed below (see PT Problem List). Pt will benefit from skilled PT to increase their independence and safety with mobility to allow discharge to the venue listed below.      Follow Up Recommendations CIR;Supervision/Assistance - 24 hour    Equipment Recommendations  Rolling walker with 5" wheels;3in1 (PT);Wheelchair (measurements PT);Wheelchair cushion (measurements PT)    Recommendations for Other Services       Precautions / Restrictions Precautions Precautions: Fall Restrictions Weight Bearing Restrictions: No      Mobility  Bed Mobility Overal bed mobility: Needs Assistance Bed Mobility: Rolling;Sidelying to Sit Rolling: Mod assist Sidelying to sit: Mod assist       General bed mobility comments: Pt needed mod assist for LEs and for trunk elevation with pt pulling up on PTs arm.  Took incr time to get to EOB and steady.    Transfers Overall transfer level: Needs assistance Equipment used: 2 person hand held assist Transfers: Sit to/from [employer] Sit to Stand: Mod assist Stand pivot transfers: Mod assist       General transfer comment: Pt stood to his feet with mod assist and held  onto PTs UEs to take pivotal steps to chair. Pt needed cues and had posterior lean neeeding mod assist for pivot with cues.  Ambulation/Gait             General Gait Details: Declined walking due to pain  Stairs            Wheelchair Mobility    Modified Rankin (Stroke Patients Only)       Balance Overall balance assessment: Needs assistance Sitting-balance support: Bilateral upper extremity supported;No upper extremity supported;Feet supported Sitting balance-Leahy Scale: Fair Sitting balance - Comments: did obtain balance at EOB and needed UE support for stability at times Postural control: Posterior lean Standing balance support: Bilateral upper extremity supported;During functional activity Standing balance-Leahy Scale: Poor Standing balance comment: Needed mod assist for standing balance as pt with flexed posture and posterior lean and definite need for bil UE support                             Pertinent Vitals/Pain Pain Assessment: Faces Faces Pain Scale: Hurts little more Pain Location: incisions Pain Descriptors / Indicators: Grimacing;Guarding;Discomfort Pain Intervention(s): Limited activity within patient's tolerance;Monitored during session;Repositioned    Home Living Family/patient expects to be discharged to:: Private residence Living Arrangements: Parent Available Help at Discharge: Family;Available 24 hours/day (mother who is 25 years old) Type of Home: House Home Access: Ramped entrance     Home Layout: One level Home Equipment: Cane - single point  Prior Function Level of Independence: Independent with assistive device(s)         Comments: Mom and girlfriend cook     Hand Dominance        Extremity/Trunk Assessment   Upper Extremity Assessment Upper Extremity Assessment: Defer to OT evaluation    Lower Extremity Assessment Lower Extremity Assessment: RLE deficits/detail;LLE deficits/detail RLE Deficits /  Details: grossly 3-/5 RLE: Unable to fully assess due to pain LLE Deficits / Details: grossly 3-/5 LLE: Unable to fully assess due to pain    Cervical / Trunk Assessment Cervical / Trunk Assessment: Kyphotic  Communication   Communication: No difficulties  Cognition Arousal/Alertness: Awake/alert Behavior During Therapy: Flat affect Overall Cognitive Status: History of cognitive impairments - at baseline                                 General Comments: Pt asked PT if he had surgery and what kind he had once he was in the chair therefore pt is possibly not oriented to situation.  He did know he was in hospital.      General Comments General comments (skin integrity, edema, etc.): 86 bpm, 92%RA, 134/94    Exercises     Assessment/Plan    PT Assessment Patient needs continued PT services  PT Problem List Decreased activity tolerance;Decreased balance;Decreased mobility;Decreased knowledge of use of DME;Decreased safety awareness;Decreased knowledge of precautions;Cardiopulmonary status limiting activity;Pain       PT Treatment Interventions Gait training;DME instruction;Stair training;Functional mobility training;Therapeutic activities;Therapeutic exercise;Balance training;Patient/family education    PT Goals (Current goals can be found in the Care Plan section)  Acute Rehab PT Goals Patient Stated Goal: to go home PT Goal Formulation: With patient Time For Goal Achievement: 09/26/20 Potential to Achieve Goals: Good    Frequency Min 3X/week   Barriers to discharge Decreased caregiver support (mom is 64 years old)      Co-evaluation               AM-PAC PT "6 Clicks" Mobility  Outcome Measure Help needed turning from your back to your side while in a flat bed without using bedrails?: A Lot Help needed moving from lying on your back to sitting on the side of a flat bed without using bedrails?: A Lot Help needed moving to and from a bed to a chair  (including a wheelchair)?: A Lot Help needed standing up from a chair using your arms (e.g., wheelchair or bedside chair)?: A Lot Help needed to walk in hospital room?: Total Help needed climbing 3-5 steps with a railing? : Total 6 Click Score: 10    End of Session Equipment Utilized During Treatment: Gait belt Activity Tolerance: Patient limited by fatigue;Patient limited by pain Patient left: in chair;with call bell/phone within reach;with chair alarm set Nurse Communication: Mobility status PT Visit Diagnosis: Unsteadiness on feet (R26.81);Muscle weakness (generalized) (M62.81);Pain Pain - Right/Left:  (bil) Pain - part of body: Leg (abdomen)    Time: 1275-1700 PT Time Calculation (min) (ACUTE ONLY): 25 min   Charges:   PT Evaluation $PT Eval Moderate Complexity: 1 Mod PT Treatments $Therapeutic Activity: 8-22 mins        Eilidh Marcano W,PT Acute Rehabilitation Services Pager:  [phone]  Office:  [phone]    Berline Lopes 09/12/2020, 1:33 PM

## 2020-09-12 NOTE — Progress Notes (Signed)
Inpatient Rehab Admissions Coordinator Note:   Per therapy recommendations, pt was screened for CIR candidacy by Bellany Elbaum, MS CCC-SLP. At this time, Pt. Appears to have functional decline and is a good candidate for CIR. Will request order for rehab consult per protocol.  Please contact me with questions.   Chrishawn Kring, MS, CCC-SLP Rehab Admissions Coordinator  [phone] (celll) [phone] (office)  

## 2020-09-13 DIAGNOSIS — L8990 Pressure ulcer of unspecified site, unspecified stage: Secondary | ICD-10-CM | POA: Insufficient documentation

## 2020-09-13 DIAGNOSIS — E139 Other specified diabetes mellitus without complications: Secondary | ICD-10-CM | POA: Diagnosis not present

## 2020-09-13 LAB — CBC
HCT: 31.8 % — ABNORMAL LOW (ref 39.0–52.0)
Hemoglobin: 10.6 g/dL — ABNORMAL LOW (ref 13.0–17.0)
MCH: 29 pg (ref 26.0–34.0)
MCHC: 33.3 g/dL (ref 30.0–36.0)
MCV: 86.9 fL (ref 80.0–100.0)
Platelets: 175 10*3/uL (ref 150–400)
RBC: 3.66 MIL/uL — ABNORMAL LOW (ref 4.22–5.81)
RDW: 14.1 % (ref 11.5–15.5)
WBC: 12.7 10*3/uL — ABNORMAL HIGH (ref 4.0–10.5)
nRBC: 0 % (ref 0.0–0.2)

## 2020-09-13 LAB — POCT I-STAT 7, (LYTES, BLD GAS, ICA,H+H)
Acid-Base Excess: 3 mmol/L — ABNORMAL HIGH (ref 0.0–2.0)
Bicarbonate: 28.3 mmol/L — ABNORMAL HIGH (ref 20.0–28.0)
Calcium, Ion: 1.12 mmol/L — ABNORMAL LOW (ref 1.15–1.40)
HCT: 33 % — ABNORMAL LOW (ref 39.0–52.0)
Hemoglobin: 11.2 g/dL — ABNORMAL LOW (ref 13.0–17.0)
O2 Saturation: 100 %
Patient temperature: 35.2
Potassium: 4.1 mmol/L (ref 3.5–5.1)
Sodium: 135 mmol/L (ref 135–145)
TCO2: 30 mmol/L (ref 22–32)
pCO2 arterial: 42.1 mmHg (ref 32.0–48.0)
pH, Arterial: 7.429 (ref 7.350–7.450)
pO2, Arterial: 295 mmHg — ABNORMAL HIGH (ref 83.0–108.0)

## 2020-09-13 LAB — POCT I-STAT, CHEM 8
BUN: 6 mg/dL — ABNORMAL LOW (ref 8–23)
Calcium, Ion: 1.15 mmol/L (ref 1.15–1.40)
Chloride: 96 mmol/L — ABNORMAL LOW (ref 98–111)
Creatinine, Ser: 0.4 mg/dL — ABNORMAL LOW (ref 0.61–1.24)
Glucose, Bld: 212 mg/dL — ABNORMAL HIGH (ref 70–99)
HCT: 36 % — ABNORMAL LOW (ref 39.0–52.0)
Hemoglobin: 12.2 g/dL — ABNORMAL LOW (ref 13.0–17.0)
Potassium: 4.1 mmol/L (ref 3.5–5.1)
Sodium: 134 mmol/L — ABNORMAL LOW (ref 135–145)
TCO2: 28 mmol/L (ref 22–32)

## 2020-09-13 LAB — COMPREHENSIVE METABOLIC PANEL
ALT: 12 U/L (ref 0–44)
AST: 53 U/L — ABNORMAL HIGH (ref 15–41)
Albumin: 2.5 g/dL — ABNORMAL LOW (ref 3.5–5.0)
Alkaline Phosphatase: 65 U/L (ref 38–126)
Anion gap: 10 (ref 5–15)
BUN: 13 mg/dL (ref 8–23)
CO2: 26 mmol/L (ref 22–32)
Calcium: 8.2 mg/dL — ABNORMAL LOW (ref 8.9–10.3)
Chloride: 102 mmol/L (ref 98–111)
Creatinine, Ser: 0.61 mg/dL (ref 0.61–1.24)
GFR, Estimated: >60 mL/min (ref >60)
Glucose, Bld: 113 mg/dL — ABNORMAL HIGH (ref 70–99)
Potassium: 3.3 mmol/L — ABNORMAL LOW (ref 3.5–5.1)
Sodium: 138 mmol/L (ref 135–145)
Total Bilirubin: 0.6 mg/dL (ref 0.3–1.2)
Total Protein: 5.5 g/dL — ABNORMAL LOW (ref 6.5–8.1)

## 2020-09-13 LAB — GLUCOSE, CAPILLARY
Glucose-Capillary: 93 mg/dL (ref 70–99)
Glucose-Capillary: 167 mg/dL — ABNORMAL HIGH (ref 70–99)
Glucose-Capillary: 51 mg/dL — ABNORMAL LOW (ref 70–99)
Glucose-Capillary: 51 mg/dL — ABNORMAL LOW (ref 70–99)
Glucose-Capillary: 91 mg/dL (ref 70–99)

## 2020-09-13 LAB — CULTURE, BLOOD (ROUTINE X 2)
Culture: NO GROWTH
Special Requests: ADEQUATE

## 2020-09-13 MED ORDER — CHLORHEXIDINE GLUCONATE CLOTH 2 % EX PADS
6.0000 | MEDICATED_PAD | Freq: Every day | CUTANEOUS | Status: DC
  Administered 2020-09-13 – 2020-09-24 (×8): 6 via TOPICAL

## 2020-09-13 MED ORDER — POTASSIUM CHLORIDE CRYS ER 20 MEQ PO TBCR
40.0000 meq | EXTENDED_RELEASE_TABLET | Freq: Every day | ORAL | Status: AC | PRN
  Administered 2020-09-13: 40 meq via ORAL
  Filled 2020-09-13: qty 2

## 2020-09-13 MED ORDER — DEXTROSE 50 % IV SOLN
12.5000 g | INTRAVENOUS | Status: AC
  Administered 2020-09-13: 12.5 g via INTRAVENOUS
  Filled 2020-09-13: qty 50

## 2020-09-13 MED ORDER — DEXTROSE 50 % IV SOLN
INTRAVENOUS | Status: AC
  Administered 2020-09-13: 50 mL
  Filled 2020-09-13: qty 50

## 2020-09-13 MED ORDER — INSULIN GLARGINE 100 UNIT/ML ~~LOC~~ SOLN
5.0000 [IU] | Freq: Two times a day (BID) | SUBCUTANEOUS | Status: DC
  Administered 2020-09-13 – 2020-09-15 (×4): 5 [IU] via SUBCUTANEOUS
  Filled 2020-09-13 (×7): qty 0.05

## 2020-09-13 NOTE — Progress Notes (Signed)
This is a progress note    NAME:  Peter Swager., MRN:  [id number], DOB:  08-28-1960, LOS: 5 ADMISSION DATE:  09/07/2020, CONSULTATION DATE:  09/11/20 REFERRING MD:  Danise Edge, CHIEF COMPLAINT:  Critical limb ischemia: Post op ICU management    Brief History:  59 yo POD 0 aortobifem bypass graft, R fem pop bypass due to critical limb ischemia. Taken to ICU post op, PCCM asked to take over as primary from IMTS.   History of Present Illness:  60 yo M PMH PVD, RLE dry gangrene, neuropathy, autoimmune diabetes, tobacco use, HTN, vision impairment  presented to ED 2/1 with CC RLE pain and wounds. Patient was referred to ED by Ortho (where he was followed for small superficial gangrenous R heel ulcer), as concerns for critical limb ischemia arose during OP ortho appointment due to rapid progression of RLE gangrene, pain. Pt admitted to IMTS, ortho and [facility] consulted 2/1.   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 High grade stenosis at junction of inferior epigastric artery and common fem arteries bilaterally. R superficial fem artery occlusion, heavily diseased L superficial fem artery with near occlusion at multiple foci.   Taken to OR with [facility] 2/4 for aortobifem bypass grafting, R fem to below knee pop bypass.  Transferred to ICU post-op, PCCM consulted in this setting  Past Medical History:  Tobacco use PVD DM  HTN Gangrene Prior MI Anxiety Hyponatremia Significant Hospital Events:  2/1 admitted to IMTS w RLE gangrene + concern for critical limb ischemia, ortho and vascular consulted. CTA with occlusive disease  2/4 OR with [facility]. ICU after case. 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 High grade stenosis at junction of inferior epigastric artery and common fem arteries  bilaterally. R superficial fem artery occlusion, heavily diseased L superficial fem artery with near occlusion at multiple foci.   Micro Data:  2/1 covid neg  2/1 MRSA +  2/2 BCx >>   Antimicrobials:  vanc rocephin  Interim History / Subjective:  No events. Up in chair. Some N/V yesterday.  Objective   Blood pressure (!) 153/73, pulse 91, temperature 98.8 F (37.1 C), temperature source Oral, resp. rate (!) 25, height 5\' 11"  (1.803 m), weight 56.4 kg, SpO2 96 %.        Intake/Output Summary (Last 24 hours) at 09/13/2020 11/11/2020 Last data filed at 09/13/2020 0600 Gross per 24 hour  Intake 3094.48 ml  Output 960 ml  Net 2134.48 ml   Filed Weights   09/09/20 1100 09/12/20 0500 09/13/20 0500  Weight: 59.9 kg 57.1 kg 56.4 kg    Examination: Constitutional: no acute distress sitting up in chair  Eyes: EOMI, pupils equal Ears, nose, mouth, and throat: MM dry, NGT in place Cardiovascular: RRR, ext warm to touch, dopplerable x 4 Respiratory: clear, no wheezing Gastrointestinal: soft, hypoactive BS Skin: No rashes, normal turgor Neurologic: moves all 4 ext to command Psychiatric: RASS -1, flat affect  Sugars on lower side Cr looks good CBC stable  Resolved Hospital Problem list     Assessment & Plan:  Critical limb right limb ischemia right leg  Dry gangrene of right foot  PAD -Underwent aortobifemoral bypass with hemashield graft, right femoral to below-knee popliteal bypass with translocated nonreversed right great saphenous vein 09/11/20 Hx of HTN Hx of  HLD Chronic hyponatremia -improved with NPO status Insulin dependent diabetes  Severe protein calorie malnutrition Muscular deconditioning  - NGT out, continue NPO per VVS - Keep SBP 120-160 - Usual doppler checks of ext - Up to chair, IS - Watch renal function - Decrease lantus a bit to 5 units BID, will need to titrate up as PO improves - Stable for PCU  Back to IMTS as primary starting tomorrow  Myrla Halsted MD  PCCM

## 2020-09-13 NOTE — Progress Notes (Signed)
[facility]  Subjective  -sitting up in chair, NAE overnight.   Objective (!) 164/67 98 98.8 F (37.1 C) (Oral) (!) 26 97%  Intake/Output Summary (Last 24 hours) at 09/13/2020 1125 Last data filed at 09/13/2020 0800 Gross per 24 hour  Intake 2916.27 ml  Output 960 ml  Net 1956.27 ml    Bilateral groin and midline incision clean dry and intact Right DP weakly palpable Left DP PT brisk by Doppler Stable tissue loss in the right lower extremity  Laboratory Lab Results: Recent Labs    09/12/20 0500 09/13/20 0228  WBC 9.0 12.7*  HGB 10.6* 10.6*  HCT 30.1* 31.8*  PLT 173 175   BMET Recent Labs    09/12/20 0500 09/13/20 0228  NA 133* 138  K 3.5 3.3*  CL 97* 102  CO2 24 26  GLUCOSE 262* 113*  BUN 9 13  CREATININE 0.62 0.61  CALCIUM 8.4* 8.2*    COAG Lab Results  Component Value Date   INR 1.2 09/11/2020   No results found for: PTT  Assessment/Planning: POD #2 status post aortobifemoral bypass and a right common femoral to below-knee popliteal bypass with vein for CLI of the right lower extremity with tissue loss.    Neuro: GCS 15, pain well controlled. CV: Hemodynamically stable.  Hemoglobin stable at 10.6.   Pulmonary: Room air.   Heparin for DVT prophylaxis. GI: NG d/c'd yesterday, one episode of emesis overnight.  Keep NPO. Renal: Creatinine stable 0.61.  Urine output 910 mL over past 24 hours.   FEN: K replaced, IVF at 125 mL/hr.  Dispo: OK for transfer to floor.  Looks excellent.  Cephus Shelling 09/13/2020 11:25 AM --

## 2020-09-14 ENCOUNTER — Encounter (HOSPITAL_COMMUNITY): Payer: Self-pay | Admitting: Vascular Surgery

## 2020-09-14 DIAGNOSIS — I96 Gangrene, not elsewhere classified: Secondary | ICD-10-CM | POA: Diagnosis not present

## 2020-09-14 DIAGNOSIS — I739 Peripheral vascular disease, unspecified: Secondary | ICD-10-CM | POA: Diagnosis not present

## 2020-09-14 DIAGNOSIS — I483 Typical atrial flutter: Secondary | ICD-10-CM

## 2020-09-14 LAB — GLUCOSE, CAPILLARY
Glucose-Capillary: 61 mg/dL — ABNORMAL LOW (ref 70–99)
Glucose-Capillary: 114 mg/dL — ABNORMAL HIGH (ref 70–99)
Glucose-Capillary: 118 mg/dL — ABNORMAL HIGH (ref 70–99)
Glucose-Capillary: 169 mg/dL — ABNORMAL HIGH (ref 70–99)
Glucose-Capillary: 253 mg/dL — ABNORMAL HIGH (ref 70–99)
Glucose-Capillary: 319 mg/dL — ABNORMAL HIGH (ref 70–99)
Glucose-Capillary: 161 mg/dL — ABNORMAL HIGH (ref 70–99)

## 2020-09-14 LAB — BASIC METABOLIC PANEL
Anion gap: 11 (ref 5–15)
Anion gap: 14 (ref 5–15)
BUN: 11 mg/dL (ref 8–23)
BUN: 14 mg/dL (ref 8–23)
CO2: 24 mmol/L (ref 22–32)
CO2: 23 mmol/L (ref 22–32)
Calcium: 7.9 mg/dL — ABNORMAL LOW (ref 8.9–10.3)
Calcium: 8 mg/dL — ABNORMAL LOW (ref 8.9–10.3)
Chloride: 102 mmol/L (ref 98–111)
Chloride: 96 mmol/L — ABNORMAL LOW (ref 98–111)
Creatinine, Ser: 0.6 mg/dL — ABNORMAL LOW (ref 0.61–1.24)
Creatinine, Ser: 0.78 mg/dL (ref 0.61–1.24)
GFR, Estimated: >60 mL/min (ref >60)
GFR, Estimated: >60 mL/min (ref >60)
Glucose, Bld: 96 mg/dL (ref 70–99)
Glucose, Bld: 186 mg/dL — ABNORMAL HIGH (ref 70–99)
Potassium: 3.1 mmol/L — ABNORMAL LOW (ref 3.5–5.1)
Potassium: 4.4 mmol/L (ref 3.5–5.1)
Sodium: 137 mmol/L (ref 135–145)
Sodium: 133 mmol/L — ABNORMAL LOW (ref 135–145)

## 2020-09-14 LAB — CBC
HCT: 30 % — ABNORMAL LOW (ref 39.0–52.0)
Hemoglobin: 10.4 g/dL — ABNORMAL LOW (ref 13.0–17.0)
MCH: 29.8 pg (ref 26.0–34.0)
MCHC: 34.7 g/dL (ref 30.0–36.0)
MCV: 86 fL (ref 80.0–100.0)
Platelets: 184 10*3/uL (ref 150–400)
RBC: 3.49 MIL/uL — ABNORMAL LOW (ref 4.22–5.81)
RDW: 14.6 % (ref 11.5–15.5)
WBC: 13.1 10*3/uL — ABNORMAL HIGH (ref 4.0–10.5)
nRBC: 0 % (ref 0.0–0.2)

## 2020-09-14 LAB — MAGNESIUM: Magnesium: 1.8 mg/dL (ref 1.7–2.4)

## 2020-09-14 MED ORDER — AMLODIPINE BESYLATE 10 MG PO TABS
10.0000 mg | ORAL_TABLET | Freq: Every day | ORAL | Status: DC
  Administered 2020-09-14 – 2020-09-15 (×2): 10 mg via ORAL
  Filled 2020-09-14 (×2): qty 1

## 2020-09-14 MED ORDER — CARVEDILOL 3.125 MG PO TABS: 3.1250 mg | ORAL_TABLET | Freq: Two times a day (BID) | ORAL | Status: DC

## 2020-09-14 MED ORDER — METOPROLOL TARTRATE 50 MG PO TABS
50.0000 mg | ORAL_TABLET | Freq: Two times a day (BID) | ORAL | Status: DC
  Administered 2020-09-14 – 2020-09-18 (×7): 50 mg via ORAL
  Filled 2020-09-14 (×8): qty 1

## 2020-09-14 MED ORDER — FUROSEMIDE 10 MG/ML IJ SOLN
40.0000 mg | Freq: Once | INTRAMUSCULAR | Status: AC
  Administered 2020-09-14: 40 mg via INTRAVENOUS
  Filled 2020-09-14: qty 4

## 2020-09-14 MED ORDER — ASPIRIN EC 81 MG PO TBEC
81.0000 mg | DELAYED_RELEASE_TABLET | Freq: Every day | ORAL | Status: DC
  Administered 2020-09-14 – 2020-09-25 (×12): 81 mg via ORAL
  Filled 2020-09-14 (×12): qty 1

## 2020-09-14 MED ORDER — POTASSIUM CHLORIDE CRYS ER 20 MEQ PO TBCR
20.0000 meq | EXTENDED_RELEASE_TABLET | ORAL | Status: AC
  Administered 2020-09-14 (×2): 20 meq via ORAL
  Filled 2020-09-14 (×2): qty 1

## 2020-09-14 MED ORDER — POTASSIUM CHLORIDE 10 MEQ/50ML IV SOLN
10.0000 meq | INTRAVENOUS | Status: AC
  Administered 2020-09-14 (×4): 10 meq via INTRAVENOUS
  Filled 2020-09-14 (×4): qty 50

## 2020-09-14 MED ORDER — ROSUVASTATIN CALCIUM 20 MG PO TABS
20.0000 mg | ORAL_TABLET | Freq: Every day | ORAL | Status: DC
  Administered 2020-09-14 – 2020-09-25 (×12): 20 mg via ORAL
  Filled 2020-09-14 (×12): qty 1

## 2020-09-14 NOTE — Progress Notes (Addendum)
[facility]  Subjective  -    Objective (!) 155/72 66 98.5 F (36.9 C) (Oral) 20 100%  Intake/Output Summary (Last 24 hours) at 09/14/2020 0734 Last data filed at 09/14/2020 0600 Gross per 24 hour  Intake 3253.98 ml  Output 1605 ml  Net 1648.98 ml    Incisions healing well +BM and flatus + BS to auscultation  Doppler signals brisk Right LE dry gangrene stable Lungs non labored breathing  Assessment/Planning: POD # 3 status post aortobifemoral bypass and a right common femoral to below-knee popliteal bypass with vein for CLI of the right lower extremity with tissue loss.  [name] doing well, OOB to chair, ambulation.  Hypokalemia replacing HGB stable Leukocytosis 13.1 Urine OP 1,605 ml last 24 hours, CR 60 Fluid positive 4000, will decrease IV NS to 50 cc/hr and give dose of lasix.  Recheck BMET after lasix and K+ to get K+.  Monitor Heart. Clear diet  D/C central line Cont cardiac monitoring Transfer to 4 E progressive.    Peter Mendez 09/14/2020 7:34 AM --  Laboratory Lab Results: Recent Labs    09/13/20 0228 09/14/20 0343  WBC 12.7* 13.1*  HGB 10.6* 10.4*  HCT 31.8* 30.0*  PLT 175 184   BMET Recent Labs    09/13/20 0228 09/14/20 0343  NA 138 137  K 3.3* 3.1*  CL 102 102  CO2 26 24  GLUCOSE 113* 96  BUN 13 11  CREATININE 0.61 0.60*  CALCIUM 8.2* 7.9*    COAG Lab Results  Component Value Date   INR 1.2 09/11/2020   No results found for: PTT  I have seen and evaluated the patient. I agree with the PA note as documented above.   POD #3 status post aortobifemoral bypass and a right common femoral to below-knee popliteal bypass with vein for CLI of the right lower extremity with tissue loss.    Neuro: GCS 15, pain well controlled. CV: Hemodynamically stable.  Hemoglobin stable at 10.6 --> 10.4 today.   Pulmonary: Room air.   Heparin for DVT prophylaxis. GI: Bowel movement, will start CLD  today. Renal: Creatinine stable 0.78.  Urine output 1085 mL over past 24 hours.   FEN: Decrease IVF to 50.  Right DP palpable, brisk doppler flow left foot as well.  Incisions looks good.  Dispo: OK for transfer to floor.     Cephus Shelling, MD [facility] Office: [phone]

## 2020-09-14 NOTE — Progress Notes (Signed)
OT Cancellation Note  Patient Details Name: Peter Mendez. MRN: [id number] DOB: [birth year]   Cancelled Treatment:    Reason Eval/Treat Not Completed: Patient at procedure or test/ unavailable (EKG). OT will continue to follow acutely  Emelda Fear 09/14/2020, 8:52 AM   Nyoka Cowden OTR/L Acute Rehabilitation Services Pager: [phone] Office: [phone]

## 2020-09-14 NOTE — Progress Notes (Signed)
Physical Therapy Treatment Patient Details Name: Peter Mendez. MRN: [id number] DOB: [birth year] Today's Date: 09/14/2020    History of Present Illness 60 yo s/p aortobifem bypass graft, R fem pop bypass due to critical limb ischemia.PMH PVD, RLE dry gangrene, neuropathy, autoimmune diabetes, tobacco use, HTN, vision impairment    PT Comments    Pt admitted with above diagnosis. Pt progressed ambulation today but still needs min assist and cues for safety. Lives with 23 year old mother and feel that short Rehab stay is still a good plan for pt.  Pt currently with functional limitations due to balance and endurance deficits. Pt will benefit from skilled PT to increase their independence and safety with mobility to allow discharge to the venue listed below.     Follow Up Recommendations  CIR;Supervision/Assistance - 24 hour     Equipment Recommendations  Rolling walker with 5" wheels;3in1 (PT);Wheelchair (measurements PT);Wheelchair cushion (measurements PT)    Recommendations for Other Services       Precautions / Restrictions Precautions Precautions: Fall Restrictions Weight Bearing Restrictions: No    Mobility  Bed Mobility Overal bed mobility: Needs Assistance Bed Mobility: Rolling;Sidelying to Sit Rolling: Min guard Sidelying to sit: Min guard       General bed mobility comments: Pt needed min guard assist for LEs and for trunk elevation.  Transfers Overall transfer level: Needs assistance Equipment used: Rolling walker (2 wheeled) Transfers: Sit to/from [employer] Sit to Stand: Min assist         General transfer comment: Pt stood to his feet with min assist to RW. Pt needed cues withslight posterior lean neeeding min assist for stability.  Ambulation/Gait Ambulation/Gait assistance: Min assist Gait Distance (Feet): 90 Feet (30 feet then 60 feet) Assistive device: Rolling walker (2 wheeled) Gait Pattern/deviations: Step-through  pattern;Decreased stride length;Drifts right/left   Gait velocity interpretation: <1.31 ft/sec, indicative of household ambulator General Gait Details: Pt needed to have BM therefore walked to bathroom first and then walked to hallway and back. Overall min assist for stability as he needs cues to stay close to RW and to steer RW at times.  Also cues for upright posture as he tends to flex.   Stairs             Wheelchair Mobility    Modified Rankin (Stroke Patients Only)       Balance Overall balance assessment: Needs assistance Sitting-balance support: Bilateral upper extremity supported;No upper extremity supported;Feet supported Sitting balance-Leahy Scale: Fair     Standing balance support: Bilateral upper extremity supported;During functional activity Standing balance-Leahy Scale: Poor Standing balance comment: Needed min assist for standing balance as pt with flexed posture and posterior lean and definite need for bil UE support                            Cognition Arousal/Alertness: Awake/alert Behavior During Therapy: Flat affect Overall Cognitive Status: Within Functional Limits for tasks assessed                                        Exercises General Exercises - Lower Extremity Ankle Circles/Pumps: AROM;Both;10 reps;Seated Long Arc Quad: AROM;Both;10 reps;Seated    General Comments General comments (skin integrity, edema, etc.): HR to 145 bpm, O2 sat 94% at rest on RA and to 86% with activity on RA therefore needed 2LO2  wiht activity to keep sats >90%.      Pertinent Vitals/Pain Pain Assessment: Faces Faces Pain Scale: Hurts little more Pain Location: incisions Pain Descriptors / Indicators: Grimacing;Guarding;Discomfort Pain Intervention(s): Limited activity within patient's tolerance;Monitored during session;Repositioned    Home Living                      Prior Function            PT Goals (current goals  can now be found in the care plan section) Acute Rehab PT Goals Patient Stated Goal: to go home Progress towards PT goals: Progressing toward goals    Frequency    Min 3X/week      PT Plan Current plan remains appropriate    Co-evaluation              AM-PAC PT "6 Clicks" Mobility   Outcome Measure  Help needed turning from your back to your side while in a flat bed without using bedrails?: A Lot Help needed moving from lying on your back to sitting on the side of a flat bed without using bedrails?: A Little Help needed moving to and from a bed to a chair (including a wheelchair)?: A Little Help needed standing up from a chair using your arms (e.g., wheelchair or bedside chair)?: A Little Help needed to walk in hospital room?: A Little Help needed climbing 3-5 steps with a railing? : A Lot 6 Click Score: 16    End of Session Equipment Utilized During Treatment: Gait belt Activity Tolerance: Patient limited by fatigue;Patient limited by pain Patient left: in chair;with call bell/phone within reach;with chair alarm set Nurse Communication: Mobility status PT Visit Diagnosis: Unsteadiness on feet (R26.81);Muscle weakness (generalized) (M62.81);Pain Pain - Right/Left:  (bil) Pain - part of body: Leg (abdomen)     Time: 9798-9211 PT Time Calculation (min) (ACUTE ONLY): 21 min  Charges:  $Gait Training: 8-22 mins                     Lyla Jasek W,PT Acute Rehabilitation Services Pager:  [phone]  Office:  [phone]     Berline Lopes 09/14/2020, 4:25 PM

## 2020-09-14 NOTE — Progress Notes (Signed)
Arrived from [facility].  Vital signs taken. CCMD notified.  CHG bath given.  SCD's and Prevalon boots in place.  Oriented to room.  Lunch given.

## 2020-09-14 NOTE — Progress Notes (Signed)
Subjective:  Patient seen in AM. Reports doing well. Denies chest pain and sob. Not feeling the palpitations. Conveys pain reasonably controlled. Moving bowels.   Objective:  Vital signs in last 24 hours: Vitals:   09/14/20 1000 09/14/20 1100 09/14/20 1110 09/14/20 1200  BP: (!) 151/70 (!) 156/90  138/68  Pulse: (!) 43 63  (!) 102  Resp: (!) 25 18  16   Temp:   97.7 F (36.5 C)   TempSrc:   Axillary   SpO2: 95% 100%  96%  Weight:      Height:       Weight change: 2 kg  Intake/Output Summary (Last 24 hours) at 09/14/2020 1222 Last data filed at 09/14/2020 1200 Gross per 24 hour  Intake 2456.1 ml  Output 2515 ml  Net -58.9 ml    Physical Exam Constitutional:      General: [name] not in acute distress.    Appearance: [name] underweight. [name] not toxic-appearing or diaphoretic.     Comments: Patient seen lying in bed comfortably, NAD.   Cardiovascular:     Rate and Rhythm: Tachycardia present. Rhythm irregular.     Comments: Warm extremities Pulmonary:     Effort: Pulmonary effort is normal.  Skin:    Comments: Incisions clean, dry, intact  Neurological:     Mental Status: [name] alert.     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 Managed by PCCM following, transferred to the floor today.    A flutter with RVR Patient with new-onset a flutter with RVR few days following aorta bifemoral and right fem-pop bypass on 2/04. Remains asymptomatic; with no chest pain, sensation of palpitations, or sob. EKG congruent, with ST abnormalities, some presence on prior EKG. Cardiology following. Repeat EKGs. Patient remains stable.   -Serial troponins if chest pain -Appreciate Cardiology recs  -Started Lopressor 50mg  BID  -Start Amiodarone drip if continues   Arterial Disease with Infrarenal Aortic Occlusion, Bilateral Common and Femoral Occlusions  Dry Gangrene with Moderate Cellulitis RLE Superficial Femoral Artery Occlusions -Bilateral S/p aorta bifemoral and right fem-pop bypass on 2/04. No systemic signs or symptomsof infection. Drastic improvement to region of erythema on right foot. 5 days on Vancomycin; 6 days on ceftriaxone. Blood cultures without growth to day  -Orthopedics and Vascular following -s/p revascularization on 2/04(aortobifemoral bypass and right fem pop bypass) -Dr. 4/04 to further evaluate tomorrow - PT/OT following  -PT recommending CIR. Patient unsure if needs.  -Cardiology following  -Started Crestor 20mg   -Started Aspirin 81 -f/ublood cultures -morphine 2mg  q2 prn,percocet q4prn,scheduled Tylenol BID  Latent Autoimmune Diabetes -SSI and qhs ssi -Lantus 5 BID -Novolog 0-6  Chronic Hyponatremia Chronic. Possibly 2/2 tea and toast diet; and mild SIADH. Appropriate at 137 today.  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 BP remained elevated on home ramipril, so had increased to 10 mg. Persists. -Continue ramipril10mg  -Added Norvasc 10mg     LOS: 6 days   , Medical  Student 09/14/2020, 12:22 PM

## 2020-09-14 NOTE — Progress Notes (Signed)
Progress Note  Patient Name: Peter Mendez. Date of Encounter: 09/14/2020  [facility] Cardiologist: No primary care provider on file.   Subjective   Laying in bed.  On bedpan.  No chest pain no shortness of breath.  Inpatient Medications    Scheduled Meds: . sodium chloride   Intravenous Once  . acetaminophen  650 mg Oral BID  . amLODipine  10 mg Oral Daily  . carvedilol  3.125 mg Oral BID WC  . chlorhexidine  15 mL Mouth Rinse BID  . Chlorhexidine Gluconate Cloth  6 each Topical Daily  . gabapentin  300 mg Oral TID  . heparin  5,000 Units Subcutaneous Q8H  . insulin aspart  0-6 Units Subcutaneous TID WC  . insulin glargine  5 Units Subcutaneous BID  . mouth rinse  15 mL Mouth Rinse q12n4p  . mupirocin ointment  1 application Nasal BID  . nicotine  21 mg Transdermal Daily  . pantoprazole  40 mg Oral Daily  . ramipril  10 mg Oral Daily  . senna  1 tablet Oral Daily   Continuous Infusions: . sodium chloride    . sodium chloride 50 mL/hr at 09/14/20 0759  . cefTRIAXone (ROCEPHIN)  IV Stopped (09/13/20 3335)  . magnesium sulfate bolus IVPB    . potassium chloride 50 mL/hr at 09/14/20 0800   PRN Meds: sodium chloride, acetaminophen **OR** acetaminophen, labetalol, magnesium sulfate bolus IVPB, metoprolol tartrate, morphine injection, ondansetron, oxyCODONE-acetaminophen, phenol   Vital Signs    Vitals:   09/14/20 0600 09/14/20 0700 09/14/20 0737 09/14/20 0800  BP: (!) 155/72 (!) 161/89  (!) 163/73  Pulse: 66 64  76  Resp: 20 (!) 21  (!) 21  Temp:   98.4 F (36.9 C)   TempSrc:   Oral   SpO2: 100% 93%  94%  Weight:      Height:        Intake/Output Summary (Last 24 hours) at 09/14/2020 0903 Last data filed at 09/14/2020 0800 Gross per 24 hour  Intake 2873.89 ml  Output 1705 ml  Net 1168.89 ml   Last 3 Weights 09/14/2020 09/13/2020 09/12/2020  Weight (lbs) 128 lb 12 oz 124 lb 5.4 oz 125 lb 14.1 oz  Weight (kg) 58.4 kg 56.4 kg 57.1 kg      Telemetry    Sinus  rhythm with frequent PACs, occasional paroxysmal atrial tachycardia.  No atrial fibrillation.- Personally Reviewed  ECG    No new, prior with poor R wave progression- Personally Reviewed  Physical Exam   GEN: No acute distress.  Thin Neck: No JVD, right IJ catheter Cardiac: RRR, no murmurs, rubs, or gallops.  Occasional ectopy Respiratory: Clear to auscultation bilaterally. GI: Soft, nontender, non-distended  MS:  Postop changes noted. Neuro:  Nonfocal  Psych: Normal affect   Labs    High Sensitivity Troponin:  No results for input(s): TROPONINIHS in the last 720 hours.    Chemistry Recent Labs  Lab 09/11/20 0604 09/11/20 0857 09/12/20 0500 09/13/20 0228 09/14/20 0343  NA 134*   < > 133* 138 137  K 3.8   < > 3.5 3.3* 3.1*  CL 93*   < > 97* 102 102  CO2 28   < > 24 26 24   GLUCOSE 78   < > 262* 113* 96  BUN 8   < > 9 13 11   CREATININE 0.67   < > 0.62 0.61 0.60*  CALCIUM 8.8*   < > 8.4* 8.2* 7.9*  PROT 6.5  --  5.6* 5.5*  --   ALBUMIN 2.7*  --  2.8* 2.5*  --   AST 27  --  27 53*  --   ALT 12  --  13 12  --   ALKPHOS 101  --  72 65  --   BILITOT 0.5  --  0.8 0.6  --   GFRNONAA >60   < > >60 >60 >60  ANIONGAP 13   < > 12 10 11    < > = values in this interval not displayed.     Hematology Recent Labs  Lab 09/12/20 0500 09/13/20 0228 09/14/20 0343  WBC 9.0 12.7* 13.1*  RBC 3.60* 3.66* 3.49*  HGB 10.6* 10.6* 10.4*  HCT 30.1* 31.8* 30.0*  MCV 83.6 86.9 86.0  MCH 29.4 29.0 29.8  MCHC 35.2 33.3 34.7  RDW 13.8 14.1 14.6  PLT 173 175 184    BNPNo results for input(s): BNP, PROBNP in the last 168 hours.   DDimer No results for input(s): DDIMER in the last 168 hours.   Radiology    No results found.  Cardiac Studies   ECHO 09/09/2020  1. Left ventricular ejection fraction, by estimation, is 50 to 55%. The  left ventricle has low normal function. The left ventricle demonstrates  regional wall motion abnormalities (see scoring diagram/findings for   description). Akinesis of apical  septal/anterior walls and apex. There is mild left ventricular  hypertrophy. Left ventricular diastolic parameters were normal.  2. Right ventricular systolic function is normal. The right ventricular  size is normal. Tricuspid regurgitation signal is inadequate for assessing  PA pressure.  3. The mitral valve is normal in structure. No evidence of mitral valve  regurgitation. No evidence of mitral stenosis.  4. The aortic valve is tricuspid. Aortic valve regurgitation is not  visualized. Mild to moderate aortic valve sclerosis/calcification is  present, without any evidence of aortic stenosis.  5. The inferior vena cava is dilated in size with >50% respiratory  variability, suggesting right atrial pressure of 8 mmHg.  6. Echodensity seen in IVC (best seen on image 64), could represent  thrombus versus artifact. Recommend IVC duplex for further evaluation  Patient Profile     60 y.o. male postop aortobifemoral bypass and right common femoral to below-knee popliteal bypass  Assessment & Plan    Severe peripheral vascular disease status post bypass as above -Aggressive secondary risk factor prevention -Discussed with him starting Crestor 20 mg, high intensity statin dose to help stabilize vascular disease.  [name] willing to take.  He does not recall having problems with statins although pravastatin is listed as an allergy secondary to myalgias.  [name] definitely willing to take Crestor now.  LDL goal less than 70. -Recommend antiplatelet aspirin if okay with vascular team.  Essential hypertension -Treat accordingly.  Watch renal function.  Currently on ramipril as well as amlodipine.  On low-dose carvedilol as well.  Chronic hyponatremia -Prior work-up has been performed.  Please let [phone] know if we can be of further assistance.  We will go ahead and sign off.      For questions or updates, please contact [facility] Please consult  [email or web address] for contact info under        Signed, [place], MD  09/14/2020, 9:03 AM

## 2020-09-14 NOTE — Progress Notes (Signed)
eLink Physician-Brief Progress Note Patient Name: Peter Mendez. DOB: 1961-03-13 MRN: [id number]   Date of Service  09/14/2020  HPI/Events of Note  AM K+ 3.1, creat 0.6/ GFR >60.  eICU Interventions  E link protocol ordered     Intervention Category Major Interventions: Electrolyte abnormality - evaluation and management  Amarius Toto G Kjirsten Bloodgood 09/14/2020, 4:55 AM

## 2020-09-14 NOTE — Progress Notes (Signed)
Paged to bedside for new onset a-flutter with RVR.  Pt assessed at bedside. He denies chest pain, SOB, lightheadedness.  Bedside RN notes that onset occurred while he was sitting at bedside.   Blood pressure (!) 122/94, pulse (S) (!) 155, temperature 97.7 F (36.5 C), temperature source Axillary, resp. rate (!) 28, height 5\' 11"  (1.803 m), weight 58.4 kg, SpO2 100 %. Cardiac: irregular rhythm, tachycardic. Extremities warm. Tele monitor showing heart rate in 110s-140s Pulm: breathing comfortably on room air Neuro: mentating well  Repeat BMP at 1130 this morning showed K 4.4.  EKG shows a flutter. Started on coreg 3.125 and amlodipine this morning   Plan: -Cardiology notified -check mag level  , MD Internal Medicine Resident PGY-2 [facility] Pager: [phone] 09/14/2020 2:15 PM

## 2020-09-14 NOTE — Progress Notes (Signed)
Inpatient Rehab Admissions:  Inpatient Rehab Consult received.  I met with patient at the bedside for rehabilitation assessment and to discuss goals and expectations of an inpatient rehab admission.  I explained that average length of stay on CIR is about 2 weeks, but varies based on progression, and that his goals would be for supervision to mod I, possibly at a w/c level.  At this time, pt is not sure whether he will need rehab; feels that as his pain continues to decrease he will mobilize better.  As I do not have a bed for this patient today, and it appears he's not quite medically ready for CIR at this point, I will follow for another few days to see how he progresses with therapy.    Signed: Shann Medal, PT, DPT Admissions Coordinator [phone] 09/14/20  12:03 PM

## 2020-09-14 NOTE — Progress Notes (Signed)
Spoke with Marcelino Duster, RN regarding order to d/c CVL.  She states that she will remove.

## 2020-09-15 ENCOUNTER — Telehealth: Payer: Self-pay | Admitting: Orthopedic Surgery

## 2020-09-15 ENCOUNTER — Inpatient Hospital Stay (HOSPITAL_COMMUNITY): Payer: Medicare Other

## 2020-09-15 ENCOUNTER — Telehealth: Payer: Self-pay | Admitting: Radiology

## 2020-09-15 DIAGNOSIS — I96 Gangrene, not elsewhere classified: Secondary | ICD-10-CM | POA: Diagnosis not present

## 2020-09-15 DIAGNOSIS — I779 Disorder of arteries and arterioles, unspecified: Secondary | ICD-10-CM | POA: Diagnosis not present

## 2020-09-15 DIAGNOSIS — R931 Abnormal findings on diagnostic imaging of heart and coronary circulation: Secondary | ICD-10-CM

## 2020-09-15 DIAGNOSIS — I480 Paroxysmal atrial fibrillation: Secondary | ICD-10-CM

## 2020-09-15 DIAGNOSIS — I4891 Unspecified atrial fibrillation: Secondary | ICD-10-CM

## 2020-09-15 LAB — TYPE AND SCREEN
ABO/RH(D): AB POS
Antibody Screen: NEGATIVE
Unit division: 0
Unit division: 0
Unit division: 0
Unit division: 0
Unit division: 0
Unit division: 0

## 2020-09-15 LAB — BPAM RBC
Blood Product Expiration Date: 202202142359
Blood Product Expiration Date: 202203012359
Blood Product Expiration Date: 202203012359
Blood Product Expiration Date: 202203012359
Blood Product Expiration Date: 202203012359
Blood Product Expiration Date: 202203042359
ISSUE DATE / TIME: 202202040823
ISSUE DATE / TIME: 202202040823
ISSUE DATE / TIME: 202202040823
ISSUE DATE / TIME: 202202040823
Unit Type and Rh: 6200
Unit Type and Rh: 6200
Unit Type and Rh: 6200
Unit Type and Rh: 6200
Unit Type and Rh: 8400
Unit Type and Rh: 8400

## 2020-09-15 LAB — CBC
HCT: 31.5 % — ABNORMAL LOW (ref 39.0–52.0)
Hemoglobin: 10.6 g/dL — ABNORMAL LOW (ref 13.0–17.0)
MCH: 28.6 pg (ref 26.0–34.0)
MCHC: 33.7 g/dL (ref 30.0–36.0)
MCV: 84.9 fL (ref 80.0–100.0)
Platelets: 229 10*3/uL (ref 150–400)
RBC: 3.71 MIL/uL — ABNORMAL LOW (ref 4.22–5.81)
RDW: 14.1 % (ref 11.5–15.5)
WBC: 8.6 10*3/uL (ref 4.0–10.5)
nRBC: 0 % (ref 0.0–0.2)

## 2020-09-15 LAB — BASIC METABOLIC PANEL
Anion gap: 11 (ref 5–15)
BUN: 20 mg/dL (ref 8–23)
CO2: 27 mmol/L (ref 22–32)
Calcium: 8.3 mg/dL — ABNORMAL LOW (ref 8.9–10.3)
Chloride: 96 mmol/L — ABNORMAL LOW (ref 98–111)
Creatinine, Ser: 0.7 mg/dL (ref 0.61–1.24)
GFR, Estimated: >60 mL/min (ref >60)
Glucose, Bld: 184 mg/dL — ABNORMAL HIGH (ref 70–99)
Potassium: 4 mmol/L (ref 3.5–5.1)
Sodium: 134 mmol/L — ABNORMAL LOW (ref 135–145)

## 2020-09-15 LAB — APTT: aPTT: 42 seconds — ABNORMAL HIGH (ref 24–36)

## 2020-09-15 LAB — GLUCOSE, CAPILLARY
Glucose-Capillary: 129 mg/dL — ABNORMAL HIGH (ref 70–99)
Glucose-Capillary: 119 mg/dL — ABNORMAL HIGH (ref 70–99)
Glucose-Capillary: 110 mg/dL — ABNORMAL HIGH (ref 70–99)
Glucose-Capillary: 118 mg/dL — ABNORMAL HIGH (ref 70–99)

## 2020-09-15 LAB — HEPARIN LEVEL (UNFRACTIONATED): Heparin Unfractionated: 1.78 IU/mL — ABNORMAL HIGH (ref 0.30–0.70)

## 2020-09-15 LAB — MAGNESIUM: Magnesium: 1.8 mg/dL (ref 1.7–2.4)

## 2020-09-15 MED ORDER — MAGNESIUM SULFATE 2 GM/50ML IV SOLN
2.0000 g | Freq: Once | INTRAVENOUS | Status: AC
  Administered 2020-09-15: 2 g via INTRAVENOUS
  Filled 2020-09-15: qty 50

## 2020-09-15 MED ORDER — APIXABAN 5 MG PO TABS
5.0000 mg | ORAL_TABLET | Freq: Two times a day (BID) | ORAL | Status: DC
  Administered 2020-09-15: 5 mg via ORAL
  Filled 2020-09-15: qty 1

## 2020-09-15 MED ORDER — HEPARIN (PORCINE) 25000 UT/250ML-% IV SOLN
1000.0000 [IU]/h | INTRAVENOUS | Status: DC
  Administered 2020-09-15: 900 [IU]/h via INTRAVENOUS
  Administered 2020-09-16: 1000 [IU]/h via INTRAVENOUS
  Filled 2020-09-15 (×2): qty 250

## 2020-09-15 MED ORDER — INSULIN ASPART 100 UNIT/ML ~~LOC~~ SOLN
0.0000 [IU] | Freq: Three times a day (TID) | SUBCUTANEOUS | Status: DC
  Administered 2020-09-16: 3 [IU] via SUBCUTANEOUS

## 2020-09-15 MED ORDER — AMIODARONE LOAD VIA INFUSION
150.0000 mg | Freq: Once | INTRAVENOUS | Status: AC
  Administered 2020-09-15: 150 mg via INTRAVENOUS
  Filled 2020-09-15: qty 83.34

## 2020-09-15 MED ORDER — AMIODARONE HCL IN DEXTROSE 360-4.14 MG/200ML-% IV SOLN
60.0000 mg/h | INTRAVENOUS | Status: AC
  Administered 2020-09-15: 60 mg/h via INTRAVENOUS
  Filled 2020-09-15 (×2): qty 200

## 2020-09-15 MED ORDER — AMIODARONE HCL IN DEXTROSE 360-4.14 MG/200ML-% IV SOLN
30.0000 mg/h | INTRAVENOUS | Status: DC
  Administered 2020-09-15 – 2020-09-16 (×3): 30 mg/h via INTRAVENOUS
  Filled 2020-09-15 (×2): qty 200

## 2020-09-15 MED ORDER — INSULIN GLARGINE 100 UNIT/ML ~~LOC~~ SOLN
10.0000 [IU] | Freq: Two times a day (BID) | SUBCUTANEOUS | Status: DC
  Administered 2020-09-15 – 2020-09-16 (×3): 10 [IU] via SUBCUTANEOUS
  Filled 2020-09-15 (×5): qty 0.1

## 2020-09-15 NOTE — Telephone Encounter (Signed)
Peter Mendez is calling from [facility] and she wants to clarify if patients status, she states that he did get 1 dose of Eliquis today. Please call her back. States that he maybe having surgery in the near future

## 2020-09-15 NOTE — Care Management Important Message (Signed)
Important Message  Patient Details  Name: Peter Mendez. MRN: [id number] Date of Birth: 11-May-1961   Medicare Important Message Given:  Yes     Renie Ora 09/15/2020, 11:06 AM

## 2020-09-15 NOTE — Progress Notes (Signed)
Inpatient Rehab Admissions Coordinator:   Note possibility of BKA; pt deciding.  Would need to occur prior to CIR admit if he is agreeable.  Also note AF with IV amio. Will continue to follow but pt not ready for CIR just yet.   Estill Dooms, PT, DPT Admissions Coordinator [phone] 09/15/20  1:48 PM

## 2020-09-15 NOTE — Discharge Instructions (Addendum)
Information on my medicine - XARELTO (Rivaroxaban)  This medication education was reviewed with me or my healthcare representative as part of my discharge preparation.   Why was Xarelto prescribed for you? Xarelto was prescribed for you to reduce the risk of a blood clot forming that can cause a stroke if you have a medical condition called atrial fibrillation (a type of irregular heartbeat).  What do you need to know about xarelto ? Take your Xarelto ONCE DAILY at the same time every day with your evening meal. If you have difficulty swallowing the tablet whole, you may crush it and mix in applesauce just prior to taking your dose.  Take Xarelto exactly as prescribed by your doctor and DO NOT stop taking Xarelto without talking to the doctor who prescribed the medication.  Stopping without other stroke prevention medication to take the place of Xarelto may increase your risk of developing a clot that causes a stroke.  Refill your prescription before you run out.  After discharge, you should have regular check-up appointments with your healthcare provider that is prescribing your Xarelto.  In the future your dose may need to be changed if your kidney function or weight changes by a significant amount.  What do you do if you miss a dose? If you are taking Xarelto ONCE DAILY and you miss a dose, take it as soon as you remember on the same day then continue your regularly scheduled once daily regimen the next day. Do not take two doses of Xarelto at the same time or on the same day.   Important Safety Information A possible side effect of Xarelto is bleeding. You should call your healthcare provider right away if you experience any of the following: ? Bleeding from an injury or your nose that does not stop. ? Unusual colored urine (red or dark brown) or unusual colored stools (red or black). ? Unusual bruising for unknown reasons. ? A serious fall or if you hit your head (even if  there is no bleeding).  Some medicines may interact with Xarelto and might increase your risk of bleeding while on Xarelto. To help avoid this, consult your healthcare provider or pharmacist prior to using any new prescription or non-prescription medications, including herbals, vitamins, non-steroidal anti-inflammatory drugs (NSAIDs) and supplements.  This website has more information on Xarelto: VisitDestination.com.br.    Atrial Fibrillation    Atrial fibrillation is a type of heartbeat that is irregular or fast. If you have this condition, your heart beats without any order. This makes it hard for your heart to pump blood in a normal way. Atrial fibrillation may come and go, or it may become a long-lasting problem. If this condition is not treated, it can put you at higher risk for stroke, heart failure, and other heart problems.  What are the causes? This condition may be caused by diseases that damage the heart. They include:  High blood pressure.  Heart failure.  Heart valve disease.  Heart surgery. Other causes include:  Diabetes.  Thyroid disease.  Being overweight.  Kidney disease. Sometimes the cause is not known.  What increases the risk? You are more likely to develop this condition if:  You are older.  You smoke.  You exercise often and very hard.  You have a family history of this condition.  You are a man.  You use drugs.  You drink a lot of alcohol.  You have lung conditions, such as emphysema, pneumonia, or COPD.  You have  sleep apnea.   What are the signs or symptoms? Common symptoms of this condition include:  A feeling that your heart is beating very fast.  Chest pain or discomfort.  Feeling short of breath.  Suddenly feeling light-headed or weak.  Getting tired easily during activity.  Fainting.  Sweating. In some cases, there are no symptoms.  How is this treated? Treatment for this condition depends on underlying conditions  and how you feel when you have atrial fibrillation. They include: 1. Medicines to: ? Prevent blood clots. ? Treat heart rate or heart rhythm problems. 2. Using devices, such as a pacemaker, to correct heart rhythm problems. 3. Doing surgery to remove the part of the heart that sends bad signals. 4. Closing an area where clots can form in the heart (left atrial appendage). In some cases, your doctor will treat other underlying conditions.  Follow these instructions at home:  Medicines 1. Take over-the-counter and prescription medicines only as told by your doctor. 2. Do not take any new medicines without first talking to your doctor. 3. If you are taking blood thinners: ? Talk with your doctor before you take any medicines that have aspirin or NSAIDs, such as ibuprofen, in them. ? Take your medicine exactly as told by your doctor. Take it at the same time each day. ? Avoid activities that could hurt or bruise you. Follow instructions about how to prevent falls. ? Wear a bracelet that says you are taking blood thinners. Or, carry a card that lists what medicines you take. Lifestyle          Do not use any products that have nicotine or tobacco in them. These include cigarettes, e-cigarettes, and chewing tobacco. If you need help quitting, ask your doctor.  Eat heart-healthy foods. Talk with your doctor about the right eating plan for you.  Exercise regularly as told by your doctor.  Do not drink alcohol.  Lose weight if you are overweight.  Do not use drugs, including cannabis.  General instructions  If you have a condition that causes breathing to stop for a short period of time (apnea), treat it as told by your doctor.  Keep a healthy weight. Do not use diet pills unless your doctor says they are safe for you. Diet pills may make heart problems worse.  Keep all follow-up visits as told by your doctor. This is important.  Contact a doctor if:  You notice a change in the  speed, rhythm, or strength of your heartbeat.  You are taking a blood-thinning medicine and you get more bruising.  You get tired more easily when you move or exercise.  You have a sudden change in weight.  Get help right away if:    1. You have pain in your chest or your belly (abdomen). 2. You have trouble breathing. 3. You have side effects of blood thinners, such as blood in your vomit, poop (stool), or pee (urine), or bleeding that cannot stop. 4. You have any signs of a stroke. "BE FAST" is an easy way to remember the main warning signs: ? B - Balance. Signs are dizziness, sudden trouble walking, or loss of balance. ? E - Eyes. Signs are trouble seeing or a change in how you see. ? F - Face. Signs are sudden weakness or loss of feeling in the face, or the face or eyelid drooping on one side. ? A - Arms. Signs are weakness or loss of feeling in an arm. This happens suddenly  and usually on one side of the body. ? S - Speech. Signs are sudden trouble speaking, slurred speech, or trouble understanding what people say. ? T - Time. Time to call emergency services. Write down what time symptoms started. 5. You have other signs of a stroke, such as: ? A sudden, very bad headache with no known cause. ? Feeling like you may vomit (nausea). ? Vomiting. ? A seizure.  These symptoms may be an emergency. Do not wait to see if the symptoms will go away. Get medical help right away. Call your local emergency services (911 in the [place] Do not drive yourself to the hospital. Summary  Atrial fibrillation is a type of heartbeat that is irregular or fast.  You are at higher risk of this condition if you smoke, are older, have diabetes, or are overweight.  Follow your doctor's instructions about medicines, diet, exercise, and follow-up visits.  Get help right away if you have signs or symptoms of a stroke.  Get help right away if you cannot catch your breath, or you have chest pain or  discomfort. This information is not intended to replace advice given to you by your health care provider. Make sure you discuss any questions you have with your health care provider. Document Revised: 01/16/2019 Document Reviewed: 01/16/2019 Elsevier Patient Education  2020 [employer].     [facility]  Discharge Instructions   Open Aortic Surgery  Please refer to the following instructions for your post-procedure care. Your surgeon or Physician Assistant will discuss any changes with you.  Activity  Avoid lifting more than eight pounds (a gallon of milk) until after your first post-operative visit. You are encouraged to walk as much as you can. You can slowly return to normal activities but must avoid strenuous activity and heavy lifting until your doctor tells you it's okay. Heavy lifting can hurt the incision and cause a hernia. Avoid activities such as vacuuming or swinging a golf club. It is normal to feel tired for several weeks after your surgery. Do not drive until your doctor gives the okay and you are no longer taking prescription pain medications. It is also normal to have difficulty with sleep habits, eating and bowl movements after surgery. These will go away with time.  Bathing/Showering  Shower daily after you go home. Do not soak in a bathtub, hot tub, or swim until the incision heals.  Incision Care  Shower every day. Clean your incision with mild soap and water. Pat the area dry with a clean towel. You do not need a bandage unless otherwise instructed. Do not apply any ointments or creams to your incision. You may have skin glue on your incision. Do not peel it off. It will come off on its own in about one week. If you have staples or sutures along your incision, they will be removed at your post op appointment.  If you have groin incisions, wash the groin wounds with soap and water daily and pat dry. (No tub bath-only shower)  Then put a  dry gauze or washcloth in the groin to keep this area dry to help prevent wound infection.  Do this daily and as needed.  Do not use Vaseline or neosporin on your incisions.  Only use soap and water on your incisions and then protect and keep dry.  Diet  Resume your normal diet. There are no special food restriction following this procedure. A low fat/low cholesterol diet is recommended  for all patients with vascular disease. After your aortic surgery, it's normal to feel full faster than usual and to not feel as hungry as you normally would. You will probably lose weight initially following your surgery. It's best to eat small, frequent meals over the course of the day. Call the office if you find that you are unable to eat even small meals.   In order to heal from your surgery, it is CRITICAL to get adequate nutrition. Your body requires vitamins, minerals, and protein. Vegetables are the best source of vitamins and minerals. If you have pain, you may take over-the-counter pain reliever such as acetaminophen (Tylenol). If you were prescribed a stronger pain medication, please be aware these medication can cause nausea and constipation. Prevent nausea by taking the medication with a snack or meal. Avoid constipation by drinking plenty of fluids and eating foods with a high amount of fiber, such as fruits, vegetables and grains. Take 100mg  of the over-the-counter stool softener Colace twice a day as needed to help with constipation. A laxative, such as Milk of Magnesia, may be recommended for you at this time. Do not take a laxative unless your surgeon or P.A. tells you it's OK.   Do not take Tylenol if you are taking stronger pain medications (such as Percocet).  Follow Up  Our office will schedule a follow up appointment 2-3 weeks after discharge.  Please call immediately for any of the following conditions    .     Severe or worsening pain in your legs or feet or in your abdomen back or  chest. . Increased pain, redness drainage (pus) from your incision site. . Increased abdominal pain, bloating, nausea, vomiting, or persistent diarrhea. . Fever of 101 degrees or higher. . Swelling in your leg (s). .  Reduce your risk of vascular disease  . Stop smoking. If you would like help, call [facility] at 1-800-QUIT-NOW (603-082-7439) or [place] at (412) 381-4795. . Manage your cholesterol . Maintain a desired weight . Control your diabetes . Keep your blood pressure down .  If you have any questions please call the office at [phone].

## 2020-09-15 NOTE — Progress Notes (Addendum)
Progress Note  Patient Name: Peter Mendez. Date of Encounter: 09/15/2020  [facility] Cardiologist: No primary care provider on file.   Subjective   Feeling fairly well but atrial fibrillation started yesterday afternoon and is continuous despite metoprolol tartrate 50 mg twice a day.  Inpatient Medications    Scheduled Meds: . sodium chloride   Intravenous Once  . acetaminophen  650 mg Oral BID  . amiodarone  150 mg Intravenous Once  . amLODipine  10 mg Oral Daily  . aspirin EC  81 mg Oral Daily  . chlorhexidine  15 mL Mouth Rinse BID  . Chlorhexidine Gluconate Cloth  6 each Topical Daily  . gabapentin  300 mg Oral TID  . heparin  5,000 Units Subcutaneous Q8H  . insulin aspart  0-15 Units Subcutaneous TID WC  . insulin glargine  5 Units Subcutaneous BID  . mouth rinse  15 mL Mouth Rinse q12n4p  . metoprolol tartrate  50 mg Oral BID  . nicotine  21 mg Transdermal Daily  . pantoprazole  40 mg Oral Daily  . ramipril  10 mg Oral Daily  . rosuvastatin  20 mg Oral Daily  . senna  1 tablet Oral Daily   Continuous Infusions: . sodium chloride    . sodium chloride 50 mL/hr at 09/14/20 0759  . amiodarone     Followed by  . amiodarone    . magnesium sulfate bolus IVPB    . magnesium sulfate bolus IVPB 2 g (09/15/20 0902)   PRN Meds: sodium chloride, acetaminophen **OR** acetaminophen, labetalol, magnesium sulfate bolus IVPB, metoprolol tartrate, morphine injection, ondansetron, oxyCODONE-acetaminophen, phenol   Vital Signs    Vitals:   09/14/20 1930 09/14/20 2313 09/15/20 0322 09/15/20 0905  BP: (!) 121/97 108/76 105/71 122/77  Pulse: 93 97 (!) 102   Resp: 16 16 16    Temp: 97.7 F (36.5 C) 97.6 F (36.4 C) 97.8 F (36.6 C)   TempSrc: Oral Oral Oral   SpO2: 100% 96% 100%   Weight:      Height:        Intake/Output Summary (Last 24 hours) at 09/15/2020 0918 Last data filed at 09/14/2020 1500 Gross per 24 hour  Intake 98.97 ml  Output 895 ml  Net -796.03 ml    Last 3 Weights 09/14/2020 09/13/2020 09/12/2020  Weight (lbs) 128 lb 12 oz 124 lb 5.4 oz 125 lb 14.1 oz  Weight (kg) 58.4 kg 56.4 kg 57.1 kg      Telemetry    Atrial fibrillation/flutter heart rates 100-120 now- Personally Reviewed  ECG    Atrial flutter noted on ECG 09/14/2020 1708, PVCs- Personally Reviewed  Physical Exam   GEN: No acute distress.   Neck: No JVD Cardiac:  Irregularly irregular mildly tachycardic, no murmurs, rubs, or gallops.  Respiratory: Clear to auscultation bilaterally. GI: Soft, nontender, non-distended  MS: No edema; No deformity.  Postop wounds noted Neuro:  Nonfocal  Psych: Normal affect   Labs    High Sensitivity Troponin:  No results for input(s): TROPONINIHS in the last 720 hours.    Chemistry Recent Labs  Lab 09/11/20 0604 09/11/20 0857 09/12/20 0500 09/13/20 0228 09/14/20 0343 09/14/20 1130 09/15/20 0128  NA 134*   < > 133* 138 137 133* 134*  K 3.8   < > 3.5 3.3* 3.1* 4.4 4.0  CL 93*   < > 97* 102 102 96* 96*  CO2 28   < > 24 26 24 23 27   GLUCOSE  78   < > 262* 113* 96 186* 184*  BUN 8   < > 9 13 11 14 20   CREATININE 0.67   < > 0.62 0.61 0.60* 0.78 0.70  CALCIUM 8.8*   < > 8.4* 8.2* 7.9* 8.0* 8.3*  PROT 6.5  --  5.6* 5.5*  --   --   --   ALBUMIN 2.7*  --  2.8* 2.5*  --   --   --   AST 27  --  27 53*  --   --   --   ALT 12  --  13 12  --   --   --   ALKPHOS 101  --  72 65  --   --   --   BILITOT 0.5  --  0.8 0.6  --   --   --   GFRNONAA >60   < > >60 >60 >60 >60 >60  ANIONGAP 13   < > 12 10 11 14 11    < > = values in this interval not displayed.     Hematology Recent Labs  Lab 09/13/20 0228 09/14/20 0343 09/15/20 0128  WBC 12.7* 13.1* 8.6  RBC 3.66* 3.49* 3.71*  HGB 10.6* 10.4* 10.6*  HCT 31.8* 30.0* 31.5*  MCV 86.9 86.0 84.9  MCH 29.0 29.8 28.6  MCHC 33.3 34.7 33.7  RDW 14.1 14.6 14.1  PLT 175 184 229    BNPNo results for input(s): BNP, PROBNP in the last 168 hours.   DDimer No results for input(s): DDIMER in the  last 168 hours.   Radiology    No results found.  Cardiac Studies   Echo-EF 50 to 55%  Patient Profile     60 y.o. 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  Heart rate improved but still greater than 100 -I will go ahead and start him on IV amiodarone -I will also place him on Eliquis 5 mg twice a day given the duration of his atrial fibrillation episode.  Stroke prophylaxis.  Watch for any signs of bleeding. -Continue with metoprolol tartrate 50 mg twice a day -Hemoglobin 10.6  Severe peripheral vascular disease -Aggressive secondary risk factor prevention, started Crestor 20 mg -Aspirin 81 vascular note reviewed.  Chronic hyponatremia -Prior work-up has been performed.    For questions or updates, please contact [facility] Please consult [email or web address] for contact info under        Signed, 11/13/20, MD  09/15/2020, 9:18 AM

## 2020-09-15 NOTE — Progress Notes (Signed)
  Amiodarone Drug - Drug Interaction Consult Note  Recommendations: Continue metoprolol for afib, monitor HR.  Continue rosuvastatin. Monitor for s/sx myopathy.    Amiodarone is metabolized by the cytochrome P450 system and therefore has the potential to cause many drug interactions. Amiodarone has an average plasma half-life of 50 days (range 20 to 100 days).   There is potential for drug interactions to occur several weeks or months after stopping treatment and the onset of drug interactions may be slow after initiating amiodarone.   [x]  Statins: Increased risk of myopathy. Simvastatin- restrict dose to 20mg  daily. Other statins: counsel patients to report any muscle pain or weakness immediately.  []  Anticoagulants: Amiodarone can increase anticoagulant effect. Consider warfarin dose reduction. Patients should be monitored closely and the dose of anticoagulant altered accordingly, remembering that amiodarone levels take several weeks to stabilize.  []  Antiepileptics: Amiodarone can increase plasma concentration of phenytoin, the dose should be reduced. Note that small changes in phenytoin dose can result in large changes in levels. Monitor patient and counsel on signs of toxicity.  [x]  Beta blockers: increased risk of bradycardia, AV block and myocardial depression. Sotalol - avoid concomitant use.  []   Calcium channel blockers (diltiazem and verapamil): increased risk of bradycardia, AV block and myocardial depression.  []   Cyclosporine: Amiodarone increases levels of cyclosporine. Reduced dose of cyclosporine is recommended.  []  Digoxin dose should be halved when amiodarone is started.  []  Diuretics: increased risk of cardiotoxicity if hypokalemia occurs.  []  Oral hypoglycemic agents (glyburide, glipizide, glimepiride): increased risk of hypoglycemia. Patient's glucose levels should be monitored closely when initiating amiodarone therapy.   []  Drugs that prolong the QT interval:   Torsades de pointes risk may be increased with concurrent use - avoid if possible.  Monitor QTc, also keep magnesium/potassium WNL if concurrent therapy can't be avoided. Antibiotics: e.g. fluoroquinolones, erythromycin. . Antiarrhythmics: e.g. quinidine, procainamide, disopyramide, sotalol. . Antipsychotics: e.g. phenothiazines, haloperidol.  . Lithium, tricyclic antidepressants, and methadone.   Thank You,  , PharmD, BCPS, [facility] Clinical Pharmacist  Please check AMION for all [facility] Pharmacy phone numbers After 10:00 PM, call Main Pharmacy [phone]

## 2020-09-15 NOTE — Progress Notes (Signed)
Occupational Therapy Evaluation Patient Details Name: Peter Mendez. MRN: [id number] DOB: February 18, 1961 Today's Date: 09/15/2020    History of Present Illness 60 yo s/p aortobifem bypass graft, R fem pop bypass due to critical limb ischemia.PMH PVD, RLE dry gangrene, neuropathy, autoimmune diabetes, tobacco use, HTN, vision impairment   Clinical Impression   Pt pleasant and mostly agreeable to session this date, reporting receiving news of recommendations for transtibial amputation, and requiring frequent redirection to task throughout. Pt endorses living with 60 y/o M at home, being indep with basic ADL's and at times using Peter Mendez for mobility. Mother or girlfriend A with meds/driving and IADL's d/t visual impairments. Currently pt presents with decreased cognition, questionable insight, decreased strength, and balance leading to consistent need for at least min A for ADL's, and min/mod A for transition to and from standing/transfers. OT will continue to follow acutely, with recommendations at this time for d/c to post acute therapy. Pt would need to improved to only min guard status to be able to return to home with mother.     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, fully blind L eye Restrictions Weight Bearing Restrictions: No      Mobility Bed Mobility Overal bed mobility: Needs Assistance Bed Mobility: Rolling;Supine to Sit;Sit to Supine Rolling: Min guard Sidelying to sit: Min guard Supine to sit: Min guard Sit to supine: Min guard        Transfers Overall transfer level: Needs assistance Equipment used: Rolling walker (2 wheeled) Transfers: Sit to/from Stand (x2) Sit to Stand: Min assist         General transfer comment: deferred OOB this date d/t  decreased insight and declinling transfer OOB to recliner or BSC    Balance Overall balance assessment: Needs assistance Sitting-balance support: Feet supported;Single extremity supported Sitting balance-Peter Mendez: Fair                                     ADL either performed or assessed with clinical judgement   ADL Overall ADL's : Needs assistance/impaired Eating/Feeding: Independent   Grooming: Oral care;Brushing hair;Wash/dry face;Set up           Upper Body Dressing : Supervision/safety   Lower Body Dressing: Minimal assistance;Cueing for safety   Toilet Transfer: Minimal assistance;Cueing for safety           Functional mobility during ADLs: Minimal assistance;Cueing for safety General ADL Comments: pt overall with fatigue  leading to need for at least min A for LB and standing ADL's with posterior sway requiring therapist intervention for correction of balance. decreased insight observed throughout.     Vision Baseline Vision/History: Legally blind Patient Visual Report: Other (comment) (fully blind L eye, R eye 40%)       Perception     Praxis      Pertinent Vitals/Pain Pain Assessment: 0-10 Pain Score: 5  Pain Location: incisions Pain Descriptors / Indicators: Grimacing;Guarding;Discomfort Pain Intervention(s): Limited activity within patient's tolerance     Hand Dominance Right   Extremity/Trunk Assessment Upper Extremity Assessment Upper Extremity Assessment: Overall WFL for tasks assessed (grossly decreased strength throughout -4/5, able to participate with ADL's but noted fatigue with task completion)  Lower Extremity Assessment Lower Extremity Assessment: Defer to PT evaluation   Cervical / Trunk Assessment Cervical / Trunk Assessment: Kyphotic   Communication Communication Communication: No difficulties   Cognition Arousal/Alertness: Awake/alert Behavior During Therapy: Flat affect Overall Cognitive Status: Within  Functional Limits for tasks assessed                                 General Comments: reports just recieving news that there is an amputation planned during this admission, difficulty with processing throughout session   General Comments  Vitals stable throughout session at bed level, EOB and with brief standing    Exercises     Shoulder Instructions      Home Living Family/patient expects to be discharged to:: Private residence Living Arrangements: Parent Available Help at Discharge: Family;Available 24 hours/day (pt lives with [name]) Type of Home: House Home Access: Ramped entrance     Home Layout: One level     Bathroom Shower/Tub: Chief Strategy Officer: Standard Bathroom Accessibility: No   Home Equipment: Cane - single point   Additional Comments: pt endorsing only using SPC at times with pain to R LE.      Prior Functioning/Environment Level of Independence: Independent with assistive device(s)        Comments: endorsing mother and girlfriend cook/drive and assist as needed with medicaiton        OT Problem List: Decreased strength;Decreased activity tolerance;Decreased range of motion;Impaired balance (sitting and/or standing);Decreased cognition;Decreased safety awareness;Decreased knowledge of use of DME or AE;Decreased knowledge of precautions;Impaired sensation;Pain      OT Treatment/Interventions: Self-care/ADL training;Therapeutic exercise;Neuromuscular education;DME and/or AE instruction;Energy conservation;Therapeutic activities;Cognitive remediation/compensation;Balance training;Patient/family education    OT Goals(Current goals can be found in the care plan section) Acute Rehab OT Goals Patient Stated Goal: to be as indep as possible OT Goal Formulation: With patient Time For Goal Achievement: 09/29/20 Potential to Achieve Goals: Fair ADL Goals Pt Will Perform Upper Body Dressing: with  modified independence;with adaptive equipment Pt Will Perform Lower Body Dressing: with modified independence;with adaptive equipment Pt Will Transfer to Toilet: with modified independence;bedside commode;grab bars;regular height toilet;stand pivot transfer Pt Will Perform Toileting - Clothing Manipulation and hygiene: with modified independence;with adaptive equipment;sit to/from stand Pt Will Perform Tub/Shower Transfer: with supervision;Stand pivot transfer;tub bench Pt/caregiver will Perform Home Exercise Program: Increased strength;Both right and left upper extremity;With written HEP provided;With Supervision  OT Frequency: Min 2X/week   Barriers to D/C:            Co-evaluation              AM-PAC OT "6 Clicks" Daily Activity     Outcome Measure Help from another person eating meals?: None Help from another person taking care of personal grooming?: A Little Help from another person toileting, which includes using toliet, bedpan, or urinal?: A Lot Help from another person bathing (including washing, rinsing, drying)?: A Lot Help from another person to put on and taking off regular upper body clothing?: A Little Help from another person to put on and taking off regular lower body clothing?: A Lot 6 Click Score: 16   End of Session Equipment Utilized During Treatment: Gait belt;Rolling walker Nurse Communication: Mobility status  Activity Tolerance: Patient limited by fatigue Patient left: in bed;with call bell/phone within reach;with bed alarm set  OT Visit Diagnosis: Unsteadiness on feet (R26.81)  Time: 3267-1245 OT Time Calculation (min): 19 min Charges:  OT General Charges $OT Visit: 1 Visit OT Evaluation $OT Eval Low Complexity: 1 Low  Leeon Makar OTR/L acute rehab services Office: [phone]   Wilhemena Durie 09/15/2020, 12:58 PM

## 2020-09-15 NOTE — Progress Notes (Signed)
Patient ID: Peter Mendez., male   DOB: 04-Jul-1961, 60 y.o.   MRN: [id number] Patient is seen is status post revascularization to both lower extremities.  The right heel and medial malleolar ulcer are full-thickness black eschar down to bone there is no healthy viable soft tissue over the calcaneus or medial malleolus.  The ischemic ulcers over the mid calf are healing nicely.  Patient does not have foot salvage options at this time I have recommended the patient proceed with a transtibial amputation.  Patient states he cannot make this decision today he would like several days to think about it, I recommended proceeding with surgery during this hospitalization.  Patient states he will call if he has further questions.

## 2020-09-15 NOTE — Progress Notes (Signed)
IMTS Progress Note:  Notified by RN that patient has acutely become confused and agitated. Took out his IV line and is seeing cats and other people in the room, believing he is at [employer] but states he "will be back soon".   A/P:  # Delirium  Patient's confusion is most likely 2/2 hospital delirium.   - Place mittens on both hands  - Place soft restraints if unable to keep mittens on hands due to agitation - Will try to get 1:1 sitter  - Replace peripheral IV once stabilized  Glenford Bayley, MD 09/15/2020, 8:01 PM Pager: [phone]

## 2020-09-15 NOTE — Progress Notes (Addendum)
Progress Note    09/15/2020 7:35 AM 4 Days Post-Op  Subjective:  Tolerating clear liquids. Ambulating. Pain controlled   Vitals:   09/14/20 2313 09/15/20 0322  BP: 108/76 105/71  Pulse: 97 (!) 102  Resp: 16 16  Temp: 97.6 F (36.4 C) 97.8 F (36.6 C)  SpO2: 96% 100%    Physical Exam: General appearance: Awake, alert in no apparent distress Cardiac: Heart rate and rhythm are regular Respirations: Nonlabored Incisions: Midline, bilateral groin, right thigh and lower leg incisions are all well approximated without bleeding or hematoma Extremities: Both feet are warm with intact sensation. Minor decrease in motor function of right toes.  1+ right DP pulse Abdomen: soft, ND, + BS    CBC    Component Value Date/Time   WBC 8.6 09/15/2020 0128   RBC 3.71 (L) 09/15/2020 0128   HGB 10.6 (L) 09/15/2020 0128   HCT 31.5 (L) 09/15/2020 0128   PLT 229 09/15/2020 0128   MCV 84.9 09/15/2020 0128   MCH 28.6 09/15/2020 0128   MCHC 33.7 09/15/2020 0128   RDW 14.1 09/15/2020 0128   LYMPHSABS 1.4 09/09/2020 0550   MONOABS 0.7 09/09/2020 0550   EOSABS 0.2 09/09/2020 0550   BASOSABS 0.0 09/09/2020 0550    BMET    Component Value Date/Time   NA 134 (L) 09/15/2020 0128   K 4.0 09/15/2020 0128   CL 96 (L) 09/15/2020 0128   CO2 27 09/15/2020 0128   GLUCOSE 184 (H) 09/15/2020 0128   BUN 20 09/15/2020 0128   CREATININE 0.70 09/15/2020 0128   CALCIUM 8.3 (L) 09/15/2020 0128   GFRNONAA >60 09/15/2020 0128     Intake/Output Summary (Last 24 hours) at 09/15/2020 0735 Last data filed at 09/14/2020 1500 Gross per 24 hour  Intake 178.67 ml  Output 1355 ml  Net -1176.33 ml    HOSPITAL MEDICATIONS Scheduled Meds: . sodium chloride   Intravenous Once  . acetaminophen  650 mg Oral BID  . amLODipine  10 mg Oral Daily  . aspirin EC  81 mg Oral Daily  . chlorhexidine  15 mL Mouth Rinse BID  . Chlorhexidine Gluconate Cloth  6 each Topical Daily  . gabapentin  300 mg Oral TID  .  heparin  5,000 Units Subcutaneous Q8H  . insulin aspart  0-15 Units Subcutaneous TID WC  . insulin glargine  5 Units Subcutaneous BID  . mouth rinse  15 mL Mouth Rinse q12n4p  . metoprolol tartrate  50 mg Oral BID  . nicotine  21 mg Transdermal Daily  . pantoprazole  40 mg Oral Daily  . ramipril  10 mg Oral Daily  . rosuvastatin  20 mg Oral Daily  . senna  1 tablet Oral Daily   Continuous Infusions: . sodium chloride    . sodium chloride 50 mL/hr at 09/14/20 0759  . magnesium sulfate bolus IVPB    . magnesium sulfate bolus IVPB     PRN Meds:.sodium chloride, acetaminophen **OR** acetaminophen, labetalol, magnesium sulfate bolus IVPB, metoprolol tartrate, morphine injection, ondansetron, oxyCODONE-acetaminophen, phenol  Assessment and Plan: POD #4status post aortobifemoral bypass and a right common femoral to below-knee popliteal bypass with vein for CLI of the right lower extremity with tissue loss. VSS. Afebrile. Excellent UOP. Hgb stable.  Advance diet. DC Foley   -DVT prophylaxis:  heparin    Wendi Maya, PA-C [facility] [phone] 09/15/2020  7:35 AM   I have examined the patient, reviewed and agree with above.  Gretta Began, MD  09/15/2020 5:51 PM

## 2020-09-15 NOTE — Progress Notes (Signed)
This RN and night shift RN entered room for bedside shift report to find patient agitated and confused in bed. Patient states he is at [employer] to get cigarettes for his friend" and that "we were just at the hospital." Patient had removed his pressure reducing boots and dislodged his IV. Patient also stated that there were "four or five cats running around the room." Bed low and locked, bed alarm engaged, and call bell within reach. On-call MD notified and new orders received.

## 2020-09-15 NOTE — Telephone Encounter (Signed)
Pts sister Barth Kirks called stating the pt called her telling her he was in the hospital and someone mentioned him having a surgery and he's not 100% sure what for, so she would like a CB to further discuss the pts current medical status  [phone]

## 2020-09-15 NOTE — Progress Notes (Addendum)
Subjective: Intermittent events: Patient continues in afib with intermittent RVR on Metoprolol 50 BID, required 5mg  PRN dose x1 yesterday evening.   Patient reports feeling well. Denies chest pain, and sob. Not feeling palpitations.   Objective:  Vital signs in last 24 hours: Vitals:   09/14/20 1930 09/14/20 2313 09/15/20 0322 09/15/20 0905  BP: (!) 121/97 108/76 105/71 122/77  Pulse: 93 97 (!) 102   Resp: 16 16 16    Temp: 97.7 F (36.5 C) 97.6 F (36.4 C) 97.8 F (36.6 C)   TempSrc: Oral Oral Oral   SpO2: 100% 96% 100%   Weight:      Height:       Weight change:   Intake/Output Summary (Last 24 hours) at 09/15/2020 1132 Last data filed at 09/15/2020 1043 Gross per 24 hour  Intake 50 ml  Output 795 ml  Net -745 ml    Physical Exam Constitutional:      General: [name] not in acute distress.    Appearance: [name] underweight. [name] not ill-appearing, toxic-appearing or diaphoretic.  Cardiovascular:     Rate and Rhythm: Tachycardia present. Rhythm irregular.     Comments: Extremities warm Pulmonary:     Effort: Pulmonary effort is normal. No respiratory distress.     Breath sounds: Normal breath sounds.  Skin:    Comments: Incisions clean and dry. Well approximated.  Neurological:     Mental Status: [name] alert.      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 CT Angio showed occlusion of infrarenal abdominal aorta and bilateral common and external iliacs;  significant stenosis at junction of inferior epigastrics and common femorals, distal right SFA occlusion, and heavily occluded left SFA. S/p aorta bifemoral and right fem-pop bypass on 2/04. Managed by PCCM following, transferred to the floor 2/7. New onset a fib, a flutter 2/7.   Arterial Disease with Infrarenal Aortic Occlusion, Bilateral Common and Femoral Occlusions  Dry Gangrene with Moderate Cellulitis RLE Superficial Femoral Artery Occlusions - Bilateral S/p aorta bifemoral and right fem-pop bypass on 2/04. No systemic signs or symptoms of infection. Drastic improvement to region of erythema on right foot. 5 days on Vancomycin; 6 days on ceftriaxone. Blood cultures without growth to date.   -Orthopedics and Vascular following  -s/p revascularization on 2/04 (aortobifemoral bypass and right fem pop bypass)  -Further evaluated by Dr. 4/04 today, who recommends proceeding with transtibial amputation; patient would like more time to think about this.  - PT/OT following  -PT recommending CIR.  -Cardiology following  -Continue Crestor 20mg   -Continue Aspirin 81 - morphine 2mg  q1 prn, percocet q4 prn, scheduled Tylenol BID     A-fib / A-flutter with RVR Patient with new-onset a-fib / a-flutter with RVR which started yesterday. Remains asymptomatic. Rates uncontrolled today on oral metop 50 BID with soft BPs. Appreciate cardiology recommendations    -Started Amiodarone drip  -Switched to Eliquis 5mg  BID   -Continue Metoprolol 50 BID   IVC echogenicity per  Pre-op echo Patient had a recent echo on 09/09/20 for pre-op evaluation with an area of akinesis of his left ventricular wall suggestive of prior anteroseptal MI. Otherwise no significant structural abnormalities. There was question of an echodensity seen in his IVC concerning for thrombus vs. Artifact   -IVC duplex for further evaluation      Latent Autoimmune Diabetes -SSI and qhs ssi -Lantus 10 BID -Novolog 0-15    Chronic  Hyponatremia, mild Chronic. Possibly 2/2 tea and toast diet; and mild SIADH. Mild at 134 today  -continued monitoring     Tobacco Use Patient considering trying to quit while admitted  - nicotine patch     HTN Patient with hx of HTN on home ramipril. Started norvasc this admission for uncontrolled BP. Holding to allow for titration of management for new-onset afib with intermittent RVR as needed.   -Holding ramipril 10mg  -Holding Norvasc 10mg        LOS: 7 days   , Medical Student 09/15/2020, 11:32 AM

## 2020-09-15 NOTE — Progress Notes (Signed)
Physical Therapy Treatment Patient Details Name: Peter Mendez. MRN: [id number] DOB: 1961/03/02 Today's Date: 09/15/2020    History of Present Illness 60 yo s/p aortobifem bypass graft, R fem pop bypass due to critical limb ischemia.PMH PVD, RLE dry gangrene, neuropathy, autoimmune diabetes, tobacco use, HTN, vision impairment.    PT Comments    Pt received in supine, anxious but agreeable to therapy session with encouragement, with good participation and tolerance for mobility. Pt limited this date due to difficulty following 1-step commands and impulsivity, but able to perform log rolling for bed mobility with minA, needing min to modA for sit<>stand from EOB to RW and modA for stepping/pre-gait activities at bedside for BLE strengthening, see therapeutic exercises as detailed below. Pt with frequent posterior LOB with dynamic seated/standing tasks and deferred OOB to chair for safety due to cognition as well as upcoming doppler scan per RN. Pt reports 4-5/10 modified RPE (fatigue) at end of session. Pt continues to benefit from PT services to progress toward functional mobility goals. Continue to recommend CIR.   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, fully blind L eye Restrictions Weight Bearing Restrictions: No    Mobility  Bed Mobility Overal bed mobility: Needs Assistance Bed Mobility: Rolling;Sidelying to Sit;Sit to Sidelying Rolling: Min guard Sidelying to sit: Min assist Supine to sit: Min guard Sit to supine: Min guard Sit to sidelying: Min assist General bed mobility comments: minA for trunk rise and max cues for safety with log rolling due to abd  incision  Transfers Overall transfer level: Needs assistance Equipment used: Rolling walker (2 wheeled) Transfers: Sit to/from Stand Sit to Stand: Min assist;Mod assist         General transfer comment: from EOB to RW x7 total reps, cues for hand placement but pt ignoring cues; decreased insight and poor following of safety cues  Ambulation/Gait Ambulation/Gait assistance: Mod assist Gait Distance (Feet): 3 Feet Assistive device: Rolling walker (2 wheeled) Gait Pattern/deviations: Step-through pattern;Decreased stride length;Staggering left;Staggering right;Leaning posteriorly     General Gait Details: pt with increased posterior lean this date and poor use of RW, needs max cues for posture/attention to task and pre-gait marching in RW and sidestepping along EOB only due to impulsivity/quick to fatigue and poor cognition   Stairs             Wheelchair Mobility    Modified Rankin (Stroke Patients Only)       Balance Overall balance assessment: Needs assistance Sitting-balance support: Feet supported;Single extremity supported Sitting balance-Leahy Scale: Fair Sitting balance - Comments: some posterior lean, needs cues for posture, able to self-correct with cues; varying minA to Supervision for seated balance   Standing balance support: Bilateral upper extremity supported Standing balance-Leahy Scale: Poor Standing balance comment: multiple posterior LOB standing at bedside and pt impulsive to remove hands from RW causing decreased balance; deferred gait trial for safety                            Cognition Arousal/Alertness: Awake/alert Behavior During Therapy: Flat affect;Anxious Overall Cognitive Status: Impaired/Different from baseline Area of Impairment: Attention;Memory;Following commands;Safety/judgement;Problem solving  Memory: Decreased recall of precautions;Decreased short-term memory Following Commands: Follows  one step commands inconsistently Safety/Judgement: Decreased awareness of safety;Decreased awareness of deficits   Problem Solving: Difficulty sequencing;Requires verbal cues;Requires tactile cues General Comments: internally distracted, esp due to news of planned amputation later in week; participatory with encouragement but following only ~50% of 1-step commands and decreased safety with RW use this date      Exercises General Exercises - Lower Extremity Ankle Circles/Pumps: AROM;Both;10 reps;Seated Long Arc Quad: AROM;Both;10 reps;Seated Hip Flexion/Marching: AROM;Both;20 reps;Seated;Standing (x10 reps seated, x10 reps standing) Other Exercises Other Exercises: STS x5 in a row for BLE strengthening    General Comments General comments (skin integrity, edema, etc.): HR to 116 bpm during standing mobility; HR 100 bpm resting/seated;  BP 107/84 supine; BP 97/68 seated      Pertinent Vitals/Pain Pain Assessment: Faces Pain Score: 5  Faces Pain Scale: Hurts little more Pain Location: abdominal and BLE incisions Pain Descriptors / Indicators: Grimacing;Guarding;Discomfort Pain Intervention(s): Monitored during session;Repositioned    Home Living Family/patient expects to be discharged to:: Private residence Living Arrangements: Parent Available Help at Discharge: Family;Available 24 hours/day (pt lives with his 33 y/o Mother who he reports) Type of Home: House Home Access: Ramped entrance   Home Layout: One level Home Equipment: Cane - single point Additional Comments: pt endorsing only using SPC at times with pain to R LE.    Prior Function Level of Independence: Independent with assistive device(s)      Comments: endorsing mother and girlfriend cook/drive and assist as needed with medicaiton   PT Goals (current goals can now be found in the care plan section) Acute Rehab PT Goals Patient Stated Goal: to be as indep as possible PT Goal Formulation: With patient Time For  Goal Achievement: 09/26/20 Potential to Achieve Goals: Good Progress towards PT goals: Progressing toward goals    Frequency    Min 3X/week      PT Plan Current plan remains appropriate    Co-evaluation              AM-PAC PT "6 Clicks" Mobility   Outcome Measure  Help needed turning from your back to your side while in a flat bed without using bedrails?: A Little Help needed moving from lying on your back to sitting on the side of a flat bed without using bedrails?: A Little Help needed moving to and from a bed to a chair (including a wheelchair)?: A Lot Help needed standing up from a chair using your arms (e.g., wheelchair or bedside chair)?: A Lot Help needed to walk in hospital room?: A Lot Help needed climbing 3-5 steps with a railing? : Total 6 Click Score: 13    End of Session Equipment Utilized During Treatment: Gait belt Activity Tolerance: Patient tolerated treatment well;Other (comment) (impulsive/decreased safety) Patient left: in bed;with bed alarm set;with call bell/phone within reach Nurse Communication: Mobility status PT Visit Diagnosis: Unsteadiness on feet (R26.81);Muscle weakness (generalized) (M62.81);Pain Pain - part of body:  (abdomen, R leg)     Time: 9629-5284 PT Time Calculation (min) (ACUTE ONLY): 23 min  Charges:  $Therapeutic Exercise: 8-22 mins $Therapeutic Activity: 8-22 mins                     Timberlynn Kizziah P., PTA Acute Rehabilitation Services Pager: [phone] Office: [phone]   Angus Palms 09/15/2020, 3:56 PM

## 2020-09-15 NOTE — Progress Notes (Addendum)
IVC Duplex  completed

## 2020-09-15 NOTE — Progress Notes (Signed)
ANTICOAGULATION CONSULT NOTE - Initial Consult  Pharmacy Consult for apixaban Indication: atrial fibrillation  Allergies  Allergen Reactions  . Pravastatin Sodium Other (See Comments)    myalgia    Patient Measurements: Height: 5\' 11"  (180.3 cm) Weight: 58.4 kg (128 lb 12 oz) IBW/kg (Calculated) : 75.3   Vital Signs: Temp: 97.8 F (36.6 C) (02/08 0322) Temp Source: Oral (02/08 0322) BP: 122/77 (02/08 0905) Pulse Rate: 102 (02/08 0322)  Labs: Recent Labs    09/13/20 0228 09/14/20 0343 09/14/20 1130 09/15/20 0128  HGB 10.6* 10.4*  --  10.6*  HCT 31.8* 30.0*  --  31.5*  PLT 175 184  --  229  CREATININE 0.61 0.60* 0.78 0.70    Estimated Creatinine Clearance: 80.1 mL/min (by C-G formula based on SCr of 0.7 mg/dL).   Medical History: Past Medical History:  Diagnosis Date  . Chicken pox   . Claudication (HCC)   . Diabetes 1.5, managed as type 1 (HCC)   . Hypertension   . Low back pain   . PAD (peripheral artery disease) (HCC)   . Tobacco abuse      Assessment: 60 yo M in persistent atrial fibrillation > 24hrs. Pharmacy consulted for apixaban.   Goal of Therapy:  Monitor platelets by anticoagulation protocol: Yes   Plan:   Start apixaban 5mg  BID  Monitor for signs/symptoms of bleeding   77, PharmD, BCPS, BCCP Clinical Pharmacist  Please check AMION for all [facility] Pharmacy phone numbers After 10:00 PM, call Main Pharmacy [phone]

## 2020-09-15 NOTE — Telephone Encounter (Signed)
I called sister and reviewed recommendations for bka, possibly surgery Friday when heart rate stabilized

## 2020-09-15 NOTE — Progress Notes (Signed)
ANTICOAGULATION CONSULT NOTE - Initial Consult  Pharmacy Consult for heparin Indication: atrial fibrillation  Allergies  Allergen Reactions  . Pravastatin Sodium Other (See Comments)    myalgia    Patient Measurements: Height: 5\' 11"  (180.3 cm) Weight: 58.4 kg (128 lb 12 oz) IBW/kg (Calculated) : 75.3  Heparin dosing weight: 58.4 kg   Vital Signs: Temp: 97.4 F (36.3 C) (02/08 1152) Temp Source: Oral (02/08 1152) BP: 113/75 (02/08 1152) Pulse Rate: 102 (02/08 0322)  Labs: Recent Labs    09/13/20 0228 09/14/20 0343 09/14/20 1130 09/15/20 0128  HGB 10.6* 10.4*  --  10.6*  HCT 31.8* 30.0*  --  31.5*  PLT 175 184  --  229  CREATININE 0.61 0.60* 0.78 0.70    Estimated Creatinine Clearance: 80.1 mL/min (by C-G formula based on SCr of 0.7 mg/dL).   Medical History: Past Medical History:  Diagnosis Date  . Chicken pox   . Claudication (HCC)   . Diabetes 1.5, managed as type 1 (HCC)   . Hypertension   . Low back pain   . PAD (peripheral artery disease) (HCC)   . Tobacco abuse      Assessment: 60 yo M in persistent atrial fibrillation > 24hrs. Pharmacy initially consulted for apixaban but now consulted for initiate heparin infusion. Patient likely to undergo surgery later this week. Received one dose of apixaban this afternoon. CBC remains stable WNL. No bleeding documented. HL likely to be elevated given recent apixaban use. Will obtain aPTT and HL until correlating.   Goal of Therapy:  Goal heparin level 0.3-0.7 Goal aPTT 66-102 Monitor platelets by anticoagulation protocol: Yes   Plan:   Start heparin infusion at 900 units/hr at 2200  Obtain baseline aPTT and HL, then daily until correlating Check 6-hr aPTT and HL Monitor for signs/symptoms of bleeding   77, PharmD PGY1 Acute Care Pharmacy Resident 09/15/2020 3:07 PM  Please check [email or web address] for unit-specific pharmacy phone numbers.

## 2020-09-16 ENCOUNTER — Inpatient Hospital Stay (HOSPITAL_COMMUNITY): Payer: Medicare Other

## 2020-09-16 DIAGNOSIS — I779 Disorder of arteries and arterioles, unspecified: Secondary | ICD-10-CM | POA: Diagnosis not present

## 2020-09-16 DIAGNOSIS — R410 Disorientation, unspecified: Secondary | ICD-10-CM

## 2020-09-16 DIAGNOSIS — I96 Gangrene, not elsewhere classified: Secondary | ICD-10-CM | POA: Diagnosis not present

## 2020-09-16 LAB — GLUCOSE, CAPILLARY
Glucose-Capillary: 160 mg/dL — ABNORMAL HIGH (ref 70–99)
Glucose-Capillary: 61 mg/dL — ABNORMAL LOW (ref 70–99)
Glucose-Capillary: 212 mg/dL — ABNORMAL HIGH (ref 70–99)
Glucose-Capillary: 131 mg/dL — ABNORMAL HIGH (ref 70–99)

## 2020-09-16 LAB — CBC
HCT: 32.9 % — ABNORMAL LOW (ref 39.0–52.0)
Hemoglobin: 11.7 g/dL — ABNORMAL LOW (ref 13.0–17.0)
MCH: 29.5 pg (ref 26.0–34.0)
MCHC: 35.6 g/dL (ref 30.0–36.0)
MCV: 83.1 fL (ref 80.0–100.0)
Platelets: 201 10*3/uL (ref 150–400)
RBC: 3.96 MIL/uL — ABNORMAL LOW (ref 4.22–5.81)
RDW: 14.1 % (ref 11.5–15.5)
WBC: 9.7 10*3/uL (ref 4.0–10.5)
nRBC: 0 % (ref 0.0–0.2)

## 2020-09-16 LAB — BASIC METABOLIC PANEL
Anion gap: 9 (ref 5–15)
BUN: 17 mg/dL (ref 8–23)
CO2: 27 mmol/L (ref 22–32)
Calcium: 8 mg/dL — ABNORMAL LOW (ref 8.9–10.3)
Chloride: 96 mmol/L — ABNORMAL LOW (ref 98–111)
Creatinine, Ser: 0.72 mg/dL (ref 0.61–1.24)
GFR, Estimated: >60 mL/min (ref >60)
Glucose, Bld: 154 mg/dL — ABNORMAL HIGH (ref 70–99)
Potassium: 3.3 mmol/L — ABNORMAL LOW (ref 3.5–5.1)
Sodium: 132 mmol/L — ABNORMAL LOW (ref 135–145)

## 2020-09-16 LAB — HEPARIN LEVEL (UNFRACTIONATED): Heparin Unfractionated: 0.85 IU/mL — ABNORMAL HIGH (ref 0.30–0.70)

## 2020-09-16 LAB — APTT
aPTT: 46 seconds — ABNORMAL HIGH (ref 24–36)
aPTT: 162 seconds (ref 24–36)
aPTT: 149 seconds — ABNORMAL HIGH (ref 24–36)

## 2020-09-16 LAB — MAGNESIUM: Magnesium: 2 mg/dL (ref 1.7–2.4)

## 2020-09-16 MED ORDER — INSULIN ASPART 100 UNIT/ML ~~LOC~~ SOLN
0.0000 [IU] | Freq: Three times a day (TID) | SUBCUTANEOUS | Status: DC
  Administered 2020-09-16 – 2020-09-18 (×2): 3 [IU] via SUBCUTANEOUS
  Administered 2020-09-18: 5 [IU] via SUBCUTANEOUS
  Administered 2020-09-18 – 2020-09-19 (×2): 7 [IU] via SUBCUTANEOUS
  Administered 2020-09-19 (×2): 5 [IU] via SUBCUTANEOUS

## 2020-09-16 MED ORDER — OXYCODONE-ACETAMINOPHEN 5-325 MG PO TABS
1.0000 | ORAL_TABLET | Freq: Four times a day (QID) | ORAL | Status: DC | PRN
Start: 2020-09-16 — End: 2020-09-19
  Administered 2020-09-16 – 2020-09-17 (×3): 2 via ORAL
  Administered 2020-09-18: 1 via ORAL
  Administered 2020-09-18: 2 via ORAL
  Administered 2020-09-19: 1 via ORAL
  Filled 2020-09-16: qty 1
  Filled 2020-09-16 (×3): qty 2
  Filled 2020-09-16: qty 1
  Filled 2020-09-16: qty 2

## 2020-09-16 MED ORDER — GADOBUTROL 1 MMOL/ML IV SOLN
7.5000 mL | Freq: Once | INTRAVENOUS | Status: AC | PRN
  Administered 2020-09-16: 7.5 mL via INTRAVENOUS

## 2020-09-16 MED ORDER — SENNOSIDES-DOCUSATE SODIUM 8.6-50 MG PO TABS
1.0000 | ORAL_TABLET | Freq: Every day | ORAL | Status: DC
  Administered 2020-09-16 – 2020-09-25 (×6): 1 via ORAL
  Filled 2020-09-16 (×7): qty 1

## 2020-09-16 MED ORDER — MORPHINE SULFATE (PF) 2 MG/ML IV SOLN
1.0000 mg | INTRAVENOUS | Status: DC | PRN
  Administered 2020-09-16 – 2020-09-19 (×2): 1 mg via INTRAVENOUS
  Filled 2020-09-16 (×2): qty 1

## 2020-09-16 MED ORDER — POTASSIUM CHLORIDE CRYS ER 20 MEQ PO TBCR
40.0000 meq | EXTENDED_RELEASE_TABLET | Freq: Two times a day (BID) | ORAL | Status: DC
  Administered 2020-09-16 (×2): 40 meq via ORAL
  Filled 2020-09-16 (×2): qty 2

## 2020-09-16 MED FILL — Sodium Chloride IV Soln 0.9%: INTRAVENOUS | Qty: 1000 | Status: AC

## 2020-09-16 MED FILL — Heparin Sodium (Porcine) Inj 1000 Unit/ML: INTRAMUSCULAR | Qty: 30 | Status: AC

## 2020-09-16 NOTE — Progress Notes (Signed)
IMTS Progress Note:  Subjective:  Paged by RN that patient has new facial droop since ~12am along with persistent confusion and some agitation. Patient [place], although later was able to explain to me his hospital course in detail (although not exactly accurately). He endorses chronic blindness with vision in his L eye worse than his R (with hx of procedure in left eye) and states his right leg feels heavy, although denies any new or chronic facial droop, slurred speech, drooling, new weakness or numbness, light-headedness, acute worsening of his vision, dizziness, nausea, CP, SOB, or any other symptoms other than pain for which he recently received pain medication.   Objective:   Last vitals all within normal limits.   General: Patient appears restless although in no acute distress.  Neurological: Pupils are pinpoint bilaterally with R pupil ~77mm and L pupil ~81mm and minimally reactive to light. Decreased visual acuity which patient states is chronic. Right-sided lower facial droop is present. CN II-XII otherwise intact. Mild dysarthria without aphasia. Dysmetria present L > R on finger to nose testing. Rapid alternating movements slow but intact bilaterally. No pronator drift. Sensation to light touch intact throughout.  Musculoskeletal: Strength is 5/5 in all four extremities. Boots in place over bilateral lower extremities.  Skin: Healing lesions present along right medial leg without significant drainage.   Assessment / Plan:   # Right Lower Facial Droop  Patient has seemingly new-onset right lower facial droop with mild dysarthria that started around midnight tonight. Denies history of this. Does also complain of right leg heaviness, although strength and sensation intact on exam. Has bilaterally pinpoint pupils and dysmetria on finger to nose testing, although this may be chronic in setting of legal blindness. Symptoms concerning for acute stroke especially in  setting of recent procedure with immobility, started on Heparin just prior to symptom onset.   - Check CT head non-contrast   # Altered Mental Status  Patient is not oriented to place intermittently although otherwise oriented. Is anxious and agitated, possibly due to combination of nerves regarding possible upcoming procedure, hospital delirium, in setting of other symptoms concerning for stroke. Sitter not currently available.   - Continue delirium precautions - Continue with mittens - Continue to attempt to get sitter   Glenford Bayley, MD 09/16/2020, 4:08 AM Pager: [phone]

## 2020-09-16 NOTE — Progress Notes (Signed)
Mobility Specialist: Progress Note   09/16/20 1607  Mobility  Activity Stood at bedside  Level of Assistance Moderate assist, patient does 50-74%  Assistive Device Front wheel walker  Mobility Response Tolerated poorly  Mobility performed by Mobility specialist  $Mobility charge 1 Mobility   Post-Mobility: 88 HR  Pt stood EOB for 2 minutes. Pt had difficulty following commands and was impulsive. Pt required frequent cues to not move or pick up on RW. Pt bowel incontinent before standing and was put back into bed to be assisted with pericare from NT and myself.  [facility] Sundee Garland Mobility Specialist Mobility Specialist Phone: [phone]

## 2020-09-16 NOTE — Progress Notes (Signed)
IMTS Progress Note:  Paged by RN that patient developed bradycardia although is otherwise HDS with persistent confusion although no new symptoms. EKG confirmed transition to sinus bradycardia w/ infrequent PAC.   - Hold amiodarone  - Hold metoprolol - Hold labetalol - Avoid AV nodal blocking agents - Continue to monitor for symptoms  - Repeat vitals and EKG if rate again becomes tachycardic  Glenford Bayley, MD 09/16/2020, 10:40 PM Pager: [phone]

## 2020-09-16 NOTE — Progress Notes (Signed)
ANTICOAGULATION CONSULT NOTE  Pharmacy Consult for heparin Indication: atrial fibrillation  Allergies  Allergen Reactions  . Pravastatin Sodium Other (See Comments)    myalgia    Patient Measurements: Height: 5\' 11"  (180.3 cm) Weight: 73.3 kg (161 lb 9.6 oz) IBW/kg (Calculated) : 75.3  Heparin dosing weight: 58.4 kg   Vital Signs: Temp: 97.8 F (36.6 C) (02/09 0850) Temp Source: Oral (02/09 0850) BP: 111/83 (02/09 0850) Pulse Rate: 93 (02/09 0850)  Labs: Recent Labs    09/14/20 0343 09/14/20 1130 09/15/20 0128 09/15/20 2050 09/16/20 0649  HGB 10.4*  --  10.6*  --  11.7*  HCT 30.0*  --  31.5*  --  32.9*  PLT 184  --  229  --  201  APTT  --   --   --  42* 46*  HEPARINUNFRC  --   --   --  1.78* 0.85*  CREATININE 0.60* 0.78 0.70  --  0.72    Estimated Creatinine Clearance: 100.5 mL/min (by C-G formula based on SCr of 0.72 mg/dL).   Medical History: Past Medical History:  Diagnosis Date  . Chicken pox   . Claudication (HCC)   . Diabetes 1.5, managed as type 1 (HCC)   . Hypertension   . Low back pain   . PAD (peripheral artery disease) (HCC)   . Tobacco abuse      Assessment: 58 yoM admitted with multiple issues and ultimately with new AFib started on apixaban, now transitioned to heparin with need for BKA on Friday.  Initial heparin level falsely elevated by DOAC, aPTT is subtherapeutic at 46 seconds.  Goal of Therapy:  Goal heparin level 0.3-0.7 Goal aPTT 66-102 Monitor platelets by anticoagulation protocol: Yes   Plan:   Increase heparin to 1100 units/h Recheck aPTT tonight Daily heparin level, aPTT, CBC   Wednesday, PharmD, BCPS, [facility] Clinical Pharmacist [phone] Please check AMION for all [facility] Pharmacy numbers 09/16/2020

## 2020-09-16 NOTE — Progress Notes (Signed)
ANTICOAGULATION CONSULT NOTE  Pharmacy Consult for heparin Indication: atrial fibrillation  Allergies  Allergen Reactions  . Pravastatin Sodium Other (See Comments)    myalgia    Patient Measurements: Height: 5\' 11"  (180.3 cm) Weight: 73.3 kg (161 lb 9.6 oz) IBW/kg (Calculated) : 75.3  Heparin dosing weight: 58.4 kg   Vital Signs: Temp: 97.6 F (36.4 C) (02/09 1535) Temp Source: Oral (02/09 1535) BP: 108/73 (02/09 1535) Pulse Rate: 71 (02/09 1536)  Labs: Recent Labs    09/14/20 0343 09/14/20 1130 09/15/20 0128 09/15/20 2050 09/16/20 0649 09/16/20 1632  HGB 10.4*  --  10.6*  --  11.7*  --   HCT 30.0*  --  31.5*  --  32.9*  --   PLT 184  --  229  --  201  --   APTT  --   --   --  42* 46* 162*  HEPARINUNFRC  --   --   --  1.78* 0.85*  --   CREATININE 0.60* 0.78 0.70  --  0.72  --     Estimated Creatinine Clearance: 100.5 mL/min (by C-G formula based on SCr of 0.72 mg/dL).   Medical History: Past Medical History:  Diagnosis Date  . Chicken pox   . Claudication (HCC)   . Diabetes 1.5, managed as type 1 (HCC)   . Hypertension   . Low back pain   . PAD (peripheral artery disease) (HCC)   . Tobacco abuse      Assessment: 10 yoM admitted with multiple issues and ultimately with new AFib started on apixaban, now transitioned to heparin with need for BKA on Friday.  Initial heparin level falsely elevated by DOAC earlier while aPTT was subtherapeutic at 46 seconds. APTT this evening came back supratherapeutic at 162 - unclear how drawn so ordered redraw and still elevated at 149. No s/sx of bleeding or infusion issues.  Goal of Therapy:  Goal heparin level 0.3-0.7 Goal aPTT 66-102 Monitor platelets by anticoagulation protocol: Yes   Plan:   Decrease heparin infusion to 1000 units/h Order aPTT in 6 hours  Daily heparin level, aPTT, CBC  Monday, PharmD, BCCCP Clinical Pharmacist  Phone: [phone] 09/16/2020 5:54 PM  Please check AMION for all [facility]  Pharmacy phone numbers After 10:00 PM, call Main Pharmacy [phone]

## 2020-09-16 NOTE — Progress Notes (Signed)
Patient seen today.  [name] minimally verbal does recall talking to his sister after surgery spoke with Dr. Lajoyce Corners last evening.     Right lower extremity is quite painful to him.  We will go forward with a right below-knee amputation on Friday

## 2020-09-16 NOTE — Progress Notes (Addendum)
[facility]  Subjective  - Just depressed and nervous.   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  toleratingPo's Plan for BKA with DR. Lajoyce Corners Friday Will hold Eliquis and Plavix start Heparin.  ASA OK  Mosetta Pigeon 09/16/2020 7:26 AM --  Laboratory Lab Results: Recent Labs    09/14/20 0343 09/15/20 0128  WBC 13.1* 8.6  HGB 10.4* 10.6*  HCT 30.0* 31.5*  PLT 184 229   BMET Recent Labs    09/14/20 1130 09/15/20 0128  NA 133* 134*  K 4.4 4.0  CL 96* 96*  CO2 23 27  GLUCOSE 186* 184*  BUN 14 20  CREATININE 0.78 0.70  CALCIUM 8.0* 8.3*    COAG Lab Results  Component Value Date   INR 1.2 09/11/2020   No results found for: PTT  I have examined the patient, reviewed and agree with above.  Leg incisions and abdominal incision healing.  Right leg itself and multiple satellite lesions look markedly better with perfusion.  Unfortunately his heel is not viable.  Agree with plan for below-knee amputation with Dr. Lajoyce Corners.  Gretta Began, MD 09/16/2020 2:41 PM

## 2020-09-16 NOTE — Progress Notes (Signed)
Pt's right knee incision dehisced and gauze dressing was off. Steri strips applied and wrapped in kerlex. Left groin incision oozing serosanguinous fluid through the covers. Gauze and tape dressing applied.

## 2020-09-16 NOTE — Progress Notes (Signed)
Subjective: Intermittent events: Plans for BKA Friday. Patient with new-onset hospital delirium overnight. Concerning neurologic exam. STAT CT and MRI without acute process.   Patient seen earlier in AM. Denies headache and chest pain. Delirium persists.   Objective:  Vital signs in last 24 hours: Vitals:   09/16/20 0850 09/16/20 1145 09/16/20 1535 09/16/20 1536  BP: 111/83 126/67 108/73   Pulse: 93 78  71  Resp: 17 18 18    Temp: 97.8 F (36.6 C) 98 F (36.7 C) 97.6 F (36.4 C)   TempSrc: Oral Oral Oral   SpO2: 99% 96%  98%  Weight:      Height:       Weight change:   Intake/Output Summary (Last 24 hours) at 09/16/2020 1557 Last data filed at 09/16/2020 0424 Gross per 24 hour  Intake 660.07 ml  Output 250 ml  Net 410.07 ml   Physical Exam Constitutional:      General: [name] not in acute distress.    Appearance: [name] not toxic-appearing or diaphoretic.  Cardiovascular:     Rate and Rhythm: Normal rate. Rhythm irregular.  Pulmonary:     Effort: Pulmonary effort is normal. No respiratory distress.  Neurological:     Mental Status: [name] alert.  Psychiatric:        Attention and Perception: [name] inattentive.     Comments: Disorganized, with mild agitation. Patient oriented to person and situation, but reports he is currently at mother's residence. Spontaneously communicates understanding of foot as not viable.      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 New onset a fib, a flutter 2/7. Plans for BKA Friday.     Arterial Disease with Infrarenal Aortic Occlusion, Bilateral Common and Femoral Occlusions  Dry Gangrene with Moderate Cellulitis RLE Superficial Femoral Artery Occlusions -Bilateral  S/paorta bifemoral and right fem-pop bypasson 2/04.No systemic signs or symptomsof infection.Drastic improvement to region of erythema on right foot. 5 days on Vancomycin; 6 days on ceftriaxone. Blood cultures without growth to date.   -Orthopedics and Vascular following -s/prevascularizationon 2/04(aortobifemoral bypass and right fem pop bypass) -Proceeding with transtibial amputation Friday      -Cleared by Cardiology -PT/OT following -recommending CIR.  -Cardiology following -Continue Crestor 20mg  -Continue Aspirin 81 -Reduced his pain meds in light of potentially contributing to new-onset hospital delirium. Previously on morphine 2mg  q1 prn,percocet q4prn,scheduled Tylenol BID.    A-fib / A-flutter with RVR Patient with new-onset a-fib / a-flutter with RVR. Remains asymptomatic. Rates now well controlled, though remains in irregular rythmn.   Cardiology following, appreciate recs: -Continue Amiodarone drip  -Transition to p.o amiodarone tomorrow if appropriate  -Continue Heparin for now  -Continue Metoprolol 50 BID  Hospital delirium -Reduced pain  meds   -increase to previous dose if increased pain -Delirium precautions -Safety precautions    IVC echogenicity per Pre-op echo; resolved Patient had a recent echo on 09/09/20 for pre-op evaluation with an area  of akinesis of his left ventricular wall suggestive of prior anteroseptal MI. Otherwise no significant structural abnormalities. There was question of an echodensity seen in his IVC concerning for thrombus vs. Artifact. No evidence of thrombus on f/u u/s.     Latent Autoimmune Diabetes -SSI and qhs ssi -Lantus 10 BID -Reduced Novolog to 0-9    Chronic Hyponatremia, mild Chronic. Possibly 2/2 tea and toast diet; and mild SIADH. Mild at 132 today  -continued monitoring   Tobacco Use Patientconsidering trying to quit while admitted  - nicotine patch   HTN Patient with hx of HTN on home ramipril. Started norvasc this admission for uncontrolled BP. Holding to allow for titration of management for new-onset afib with intermittent RVR as needed.   -Holdingramipril10mg  -Holding Norvasc 10mg     LOS: 8 days   , Medical Student 09/16/2020, 3:57 PM

## 2020-09-16 NOTE — Progress Notes (Signed)
Noticed between 2300 and 0000 that they patient had a small droop to his lower right lip.  Could not remember seeing it there before.  The patient was still able to swallow and had no slurred speech.  There was no new weakness in his extremities.  The droop did not resolved, so the on call physician, Glenford Bayley, MD, was paged and she came down to see him.  After assessing the patient, the doctor ordered a head CT scan STAT.  Will continue to monitor.  Harriet Masson, RN

## 2020-09-16 NOTE — Progress Notes (Addendum)
Inpatient Diabetes Program Recommendations  AACE/ADA: New Consensus Statement on Inpatient Glycemic Control (2015)  Target Ranges:  Prepandial:   less than 140 mg/dL      Peak postprandial:   less than 180 mg/dL (1-2 hours)      Critically ill patients:  140 - 180 mg/dL   Lab Results  Component Value Date   GLUCAP 61 (L) 09/16/2020   HGBA1C 8.8 (A) 07/21/2020    Review of Glycemic Control Results for Peter Mendez, Peter Mendez (MRN [id number]) as of 09/16/2020 12:07  Ref. Range 09/15/2020 11:53 09/15/2020 16:31 09/15/2020 20:36 09/16/2020 06:48 09/16/2020 11:40  Glucose-Capillary Latest Ref Range: 70 - 99 mg/dL 175 (H) 102 (H) 585 (H) 160 (H) 61 (L)    Inpatient Diabetes Program Recommendations:    Novolog 0-9 units TID  Will continue to follow while inpatient.  Thank you, Dulce Sellar, RN, BSN Diabetes Coordinator Inpatient Diabetes Program [phone] (team pager from 8a-5p)

## 2020-09-16 NOTE — Progress Notes (Signed)
Inpatient Rehab Admissions Coordinator:   Note plans for BKA on Friday.  Will f/u once post-op therapy evaluations have been completed.   Estill Dooms, PT, DPT Admissions Coordinator [phone] 09/16/20  9:25 AM

## 2020-09-16 NOTE — Progress Notes (Signed)
Progress Note  Patient Name: Peter Mendez. Date of Encounter: 09/16/2020   Subjective   Anxious, somewhat delirious earlier, no chest pain, no shortness of breath  Inpatient Medications    Scheduled Meds: . sodium chloride   Intravenous Once  . acetaminophen  650 mg Oral BID  . aspirin EC  81 mg Oral Daily  . chlorhexidine  15 mL Mouth Rinse BID  . Chlorhexidine Gluconate Cloth  6 each Topical Daily  . gabapentin  300 mg Oral TID  . insulin aspart  0-15 Units Subcutaneous TID WC  . insulin glargine  10 Units Subcutaneous BID  . mouth rinse  15 mL Mouth Rinse q12n4p  . metoprolol tartrate  50 mg Oral BID  . nicotine  21 mg Transdermal Daily  . pantoprazole  40 mg Oral Daily  . potassium chloride  40 mEq Oral BID  . rosuvastatin  20 mg Oral Daily  . senna-docusate  1 tablet Oral Daily   Continuous Infusions: . sodium chloride    . sodium chloride 50 mL/hr at 09/16/20 0424  . amiodarone 30 mg/hr (09/16/20 1341)  . heparin 1,100 Units/hr (09/16/20 0911)  . magnesium sulfate bolus IVPB     PRN Meds: sodium chloride, acetaminophen **OR** acetaminophen, labetalol, magnesium sulfate bolus IVPB, metoprolol tartrate, morphine injection, ondansetron, oxyCODONE-acetaminophen, phenol   Vital Signs    Vitals:   09/16/20 0416 09/16/20 0427 09/16/20 0850 09/16/20 1145  BP:  118/72 111/83 126/67  Pulse:  (!) 31 93 78  Resp:  16 17 18   Temp:  97.6 F (36.4 C) 97.8 F (36.6 C) 98 F (36.7 C)  TempSrc:  Oral Oral Oral  SpO2:  100% 99% 96%  Weight: 73.3 kg     Height:        Intake/Output Summary (Last 24 hours) at 09/16/2020 1346 Last data filed at 09/16/2020 0424 Gross per 24 hour  Intake 842.54 ml  Output 250 ml  Net 592.54 ml   Last 3 Weights 09/16/2020 09/14/2020 09/13/2020  Weight (lbs) 161 lb 9.6 oz 128 lb 12 oz 124 lb 5.4 oz  Weight (kg) 73.3 kg 58.4 kg 56.4 kg      Telemetry    Atrial flutter/fibrillation with good overall rate control less than 100- Personally  Reviewed  ECG    Atrial fibrillation T wave inversions noted in the precordial leads- Personally Reviewed  Physical Exam   GEN:  Thin, in bed Neck: No JVD Cardiac:  Irregularly irregular, no murmurs, rubs, or gallops.  Respiratory: Clear to auscultation bilaterally. GI: Soft, nontender, non-distended  MS:  Surgical wound noted Neuro:  Nonfocal  Psych: Mildly agitated, anxious, able to answer some questions but does appear somewhat delirious  Labs    High Sensitivity Troponin:  No results for input(s): TROPONINIHS in the last 720 hours.    Chemistry Recent Labs  Lab 09/11/20 0604 09/11/20 0857 09/12/20 0500 09/13/20 0228 09/14/20 0343 09/14/20 1130 09/15/20 0128 09/16/20 0649  NA 134*   < > 133* 138   < > 133* 134* 132*  K 3.8   < > 3.5 3.3*   < > 4.4 4.0 3.3*  CL 93*   < > 97* 102   < > 96* 96* 96*  CO2 28   < > 24 26   < > 23 27 27   GLUCOSE 78   < > 262* 113*   < > 186* 184* 154*  BUN 8   < > 9 13   < >  14 20 17   CREATININE 0.67   < > 0.62 0.61   < > 0.78 0.70 0.72  CALCIUM 8.8*   < > 8.4* 8.2*   < > 8.0* 8.3* 8.0*  PROT 6.5  --  5.6* 5.5*  --   --   --   --   ALBUMIN 2.7*  --  2.8* 2.5*  --   --   --   --   AST 27  --  27 53*  --   --   --   --   ALT 12  --  13 12  --   --   --   --   ALKPHOS 101  --  72 65  --   --   --   --   BILITOT 0.5  --  0.8 0.6  --   --   --   --   GFRNONAA >60   < > >60 >60   < > >60 >60 >60  ANIONGAP 13   < > 12 10   < > 14 11 9    < > = values in this interval not displayed.     Hematology Recent Labs  Lab 09/14/20 0343 09/15/20 0128 09/16/20 0649  WBC 13.1* 8.6 9.7  RBC 3.49* 3.71* 3.96*  HGB 10.4* 10.6* 11.7*  HCT 30.0* 31.5* 32.9*  MCV 86.0 84.9 83.1  MCH 29.8 28.6 29.5  MCHC 34.7 33.7 35.6  RDW 14.6 14.1 14.1  PLT 184 229 201    BNPNo results for input(s): BNP, PROBNP in the last 168 hours.   DDimer No results for input(s): DDIMER in the last 168 hours.   Radiology    CT HEAD WO CONTRAST  Result Date:  09/16/2020 CLINICAL DATA:  60 year old male with acute confusion and agitation. Neurologic deficit. EXAM: CT HEAD WITHOUT CONTRAST TECHNIQUE: Contiguous axial images were obtained from the base of the skull through the vertex without intravenous contrast. COMPARISON:  None. FINDINGS: Brain: Cerebral volume is within normal limits for age. No midline shift, ventriculomegaly, mass effect, evidence of mass lesion, intracranial hemorrhage or evidence of cortically based acute infarction. Gray-white matter differentiation is within normal limits throughout the brain. Mild dural calcification incidentally noted. No encephalomalacia identified. Vascular: Calcified atherosclerosis at the skull base. No suspicious intracranial vascular hyperdensity. Skull: No acute osseous abnormality identified. Sinuses/Orbits: Largely clear paranasal sinuses, middle ears and mastoids. There is mild ethmoid mucosal thickening. Other: No acute orbit or scalp soft tissue finding. IMPRESSION: 1. Normal for age non contrast CT appearance of the brain. 2. Mild ethmoid sinus inflammation. Electronically Signed   By: 11/14/2020 M.D.   On: 09/16/2020 04:21   MR BRAIN W WO CONTRAST  Result Date: 09/16/2020 CLINICAL DATA:  60 year old male with acute confusion and agitation. Neurologic deficit. EXAM: MRI HEAD WITHOUT AND WITH CONTRAST TECHNIQUE: Multiplanar, multiecho pulse sequences of the brain and surrounding structures were obtained without and with intravenous contrast. CONTRAST:  7.22mL GADAVIST GADOBUTROL 1 MMOL/ML IV SOLN COMPARISON:  Head CT 0415 hours today. FINDINGS: Brain: Cerebral volume is within normal limits for age. No restricted diffusion to suggest acute infarction. No midline shift, mass effect, evidence of mass lesion, ventriculomegaly, extra-axial collection or acute intracranial hemorrhage. Cervicomedullary junction and pituitary are within normal limits. 77 and white matter signal is largely normal for age throughout the  brain. No cortical encephalomalacia or chronic cerebral blood products. Mild T2 heterogeneity in both thalami, suspect small chronic thalamic lacunar infarct particularly medial on  the right series 10, image 14. Tiny chronic right cerebellar infarct is possible on series 10, image 8. No abnormal enhancement identified.  No dural thickening. Vascular: Major intracranial vascular flow voids are preserved. The major dural venous sinuses are enhancing and appear to be patent. Skull and upper cervical spine: Negative. Visualized bone marrow signal is within normal limits. Sinuses/Orbits: Postoperative changes to both globes. Otherwise negative. Other: Grossly normal visible internal auditory structures. Mastoids are clear. Scalp and face soft tissues appear negative. IMPRESSION: 1. No acute intracranial abnormality. 2. 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 Electronically Signed   By: Odessa Fleming M.D.   On: 09/16/2020 06:49   VAS US AORTA/IVC/ILIACS  Result Date: 09/15/2020 IVC/ILIAC STUDY Indications: 2/2 Possible IVC thrombus on Echo Risk Factors: Hypertension, Diabetes, current smoker. Other Factors: Known PAD, Patient Consult for heparin placed 2/8. Vascular Interventions: Aorta Bifem 2/4                         Right Fem-pop 2/4.  Comparison Study: No previous Performing Technologist: Clint Guy RVT  Examination Guidelines: A complete evaluation includes B-mode imaging, spectral Doppler, color Doppler, and power Doppler as needed of all accessible portions of each vessel. Bilateral testing is considered an integral part of a complete examination. Limited examinations for reoccurring indications may be performed as noted.  IVC/Iliac Findings: +----------+------+--------+--------+    IVC    PatentThrombusComments +----------+------+--------+--------+ IVC Prox  patent                 +----------+------+--------+--------+ IVC Mid   patent                  +----------+------+--------+--------+ IVC Distalpatent                 +----------+------+--------+--------+  +-------------------+---------+-----------+---------+-----------+--------+         CIV        RT-PatentRT-ThrombusLT-PatentLT-ThrombusComments +-------------------+---------+-----------+---------+-----------+--------+ Common Iliac Prox   patent              patent                      +-------------------+---------+-----------+---------+-----------+--------+ Common Iliac Mid    patent              patent                      +-------------------+---------+-----------+---------+-----------+--------+ Common Iliac Distal patent              patent                      +-------------------+---------+-----------+---------+-----------+--------+  +-------------------------+---------+-----------+---------+-----------+--------+            EIV           RT-PatentRT-ThrombusLT-PatentLT-ThrombusComments +-------------------------+---------+-----------+---------+-----------+--------+ External Iliac Vein Prox  patent              patent                      +-------------------------+---------+-----------+---------+-----------+--------+ External Iliac Vein Mid   patent              patent                      +-------------------------+---------+-----------+---------+-----------+--------+ External Iliac Vein       patent              patent  Distal                                                                    +-------------------------+---------+-----------+---------+-----------+--------+  Summary: IVC/Iliac: No evidence of thrombus in IVC and Iliac veins. There is no evidence of thrombus involving the right common iliac vein and left common iliac vein. There is no evidence of thrombus involving the right external iliac vein and left external iliac  vein.  *See table(s) above for measurements and observations.  Electronically  signed by Waverly Ferrari MD on 09/15/2020 at 8:59:58 PM.    Final     Cardiac Studies   EF 50 to 55%  Patient Profile     60 y.o. male postop aortobifemoral bypass and right common femoral to below-knee popliteal bypass with postoperative atrial fibrillation/flutter, gangrene, awaiting BKA Friday Dr. Lajoyce Corners  Assessment & Plan    Paroxysmal atrial fibrillation/flutter -Getting IV amiodarone currently.  Still in atrial flutter. -Tomorrow, would not be unreasonable to transition him to p.o. amiodarone if he is able to tolerate.  If he is still showing signs of delirium, okay to continue with IV load at this time. -Currently well rate controlled.  He may proceed with BKA from a cardiology perspective  Chronic anticoagulation -We are going to hold off on utilizing Eliquis given his upcoming BKA with Dr. Lajoyce Corners. -IV heparin for now.  No evidence of IVC thrombus on dedicated ultrasound.      For questions or updates, please contact [facility] Please consult [email or web address] for contact info under        Signed, Donato Schultz, MD  09/16/2020, 1:46 PM

## 2020-09-17 ENCOUNTER — Other Ambulatory Visit: Payer: Self-pay | Admitting: Physician Assistant

## 2020-09-17 DIAGNOSIS — I480 Paroxysmal atrial fibrillation: Secondary | ICD-10-CM | POA: Diagnosis not present

## 2020-09-17 DIAGNOSIS — D696 Thrombocytopenia, unspecified: Secondary | ICD-10-CM

## 2020-09-17 LAB — BASIC METABOLIC PANEL
Anion gap: 8 (ref 5–15)
Anion gap: 8 (ref 5–15)
BUN: 17 mg/dL (ref 8–23)
BUN: 13 mg/dL (ref 8–23)
CO2: 25 mmol/L (ref 22–32)
CO2: 24 mmol/L (ref 22–32)
Calcium: 8.1 mg/dL — ABNORMAL LOW (ref 8.9–10.3)
Calcium: 8 mg/dL — ABNORMAL LOW (ref 8.9–10.3)
Chloride: 100 mmol/L (ref 98–111)
Chloride: 102 mmol/L (ref 98–111)
Creatinine, Ser: 0.83 mg/dL (ref 0.61–1.24)
Creatinine, Ser: 0.63 mg/dL (ref 0.61–1.24)
GFR, Estimated: >60 mL/min (ref >60)
GFR, Estimated: >60 mL/min (ref >60)
Glucose, Bld: 89 mg/dL (ref 70–99)
Glucose, Bld: 86 mg/dL (ref 70–99)
Potassium: 3.3 mmol/L — ABNORMAL LOW (ref 3.5–5.1)
Potassium: 4 mmol/L (ref 3.5–5.1)
Sodium: 133 mmol/L — ABNORMAL LOW (ref 135–145)
Sodium: 134 mmol/L — ABNORMAL LOW (ref 135–145)

## 2020-09-17 LAB — CBC
HCT: 28.7 % — ABNORMAL LOW (ref 39.0–52.0)
HCT: 28.9 % — ABNORMAL LOW (ref 39.0–52.0)
Hemoglobin: 9.6 g/dL — ABNORMAL LOW (ref 13.0–17.0)
Hemoglobin: 10.5 g/dL — ABNORMAL LOW (ref 13.0–17.0)
MCH: 28.4 pg (ref 26.0–34.0)
MCH: 30.3 pg (ref 26.0–34.0)
MCHC: 33.4 g/dL (ref 30.0–36.0)
MCHC: 36.3 g/dL — ABNORMAL HIGH (ref 30.0–36.0)
MCV: 84.9 fL (ref 80.0–100.0)
MCV: 83.3 fL (ref 80.0–100.0)
Platelets: 111 10*3/uL — ABNORMAL LOW (ref 150–400)
Platelets: 93 10*3/uL — ABNORMAL LOW (ref 150–400)
RBC: 3.38 MIL/uL — ABNORMAL LOW (ref 4.22–5.81)
RBC: 3.47 MIL/uL — ABNORMAL LOW (ref 4.22–5.81)
RDW: 14.1 % (ref 11.5–15.5)
RDW: 14.5 % (ref 11.5–15.5)
WBC: 10 10*3/uL (ref 4.0–10.5)
WBC: 12.1 10*3/uL — ABNORMAL HIGH (ref 4.0–10.5)
nRBC: 0 % (ref 0.0–0.2)
nRBC: 0 % (ref 0.0–0.2)

## 2020-09-17 LAB — GLUCOSE, CAPILLARY
Glucose-Capillary: 73 mg/dL (ref 70–99)
Glucose-Capillary: 78 mg/dL (ref 70–99)
Glucose-Capillary: 59 mg/dL — ABNORMAL LOW (ref 70–99)
Glucose-Capillary: 87 mg/dL (ref 70–99)
Glucose-Capillary: 142 mg/dL — ABNORMAL HIGH (ref 70–99)

## 2020-09-17 LAB — LACTATE DEHYDROGENASE: LDH: 217 U/L — ABNORMAL HIGH (ref 98–192)

## 2020-09-17 LAB — APTT: aPTT: 142 seconds — ABNORMAL HIGH (ref 24–36)

## 2020-09-17 LAB — HEPARIN LEVEL (UNFRACTIONATED): Heparin Unfractionated: 0.5 IU/mL (ref 0.30–0.70)

## 2020-09-17 LAB — MAGNESIUM: Magnesium: 1.9 mg/dL (ref 1.7–2.4)

## 2020-09-17 MED ORDER — SODIUM CHLORIDE 0.9% FLUSH
3.0000 mL | Freq: Two times a day (BID) | INTRAVENOUS | Status: DC
  Administered 2020-09-17 – 2020-09-18 (×2): 3 mL via INTRAVENOUS

## 2020-09-17 MED ORDER — POTASSIUM CHLORIDE CRYS ER 20 MEQ PO TBCR
40.0000 meq | EXTENDED_RELEASE_TABLET | Freq: Two times a day (BID) | ORAL | Status: DC
  Administered 2020-09-17 – 2020-09-18 (×4): 40 meq via ORAL
  Filled 2020-09-17 (×5): qty 2

## 2020-09-17 MED ORDER — INSULIN GLARGINE 100 UNIT/ML ~~LOC~~ SOLN
5.0000 [IU] | Freq: Two times a day (BID) | SUBCUTANEOUS | Status: DC
  Administered 2020-09-17: 5 [IU] via SUBCUTANEOUS
  Filled 2020-09-17 (×2): qty 0.05

## 2020-09-17 MED ORDER — OLANZAPINE 5 MG PO TABS
5.0000 mg | ORAL_TABLET | Freq: Every day | ORAL | Status: DC
  Filled 2020-09-17: qty 1

## 2020-09-17 MED ORDER — SODIUM CHLORIDE 0.9 % IV SOLN: 250.0000 mL | INTRAVENOUS | Status: DC | PRN

## 2020-09-17 MED ORDER — MAGNESIUM SULFATE IN D5W 1-5 GM/100ML-% IV SOLN
1.0000 g | Freq: Once | INTRAVENOUS | Status: AC
  Administered 2020-09-17: 1 g via INTRAVENOUS
  Filled 2020-09-17: qty 100

## 2020-09-17 MED ORDER — ALPRAZOLAM 0.5 MG PO TABS
0.5000 mg | ORAL_TABLET | Freq: Three times a day (TID) | ORAL | Status: DC | PRN
  Administered 2020-09-17 – 2020-09-25 (×12): 0.5 mg via ORAL
  Filled 2020-09-17 (×12): qty 1

## 2020-09-17 MED ORDER — SODIUM CHLORIDE 0.9% FLUSH: 3.0000 mL | INTRAVENOUS | Status: DC | PRN

## 2020-09-17 NOTE — Progress Notes (Signed)
Inpatient Diabetes Program Recommendations  AACE/ADA: New Consensus Statement on Inpatient Glycemic Control (2015)  Target Ranges:  Prepandial:   less than 140 mg/dL      Peak postprandial:   less than 180 mg/dL (1-2 hours)      Critically ill patients:  140 - 180 mg/dL   Lab Results  Component Value Date   GLUCAP 73 09/17/2020   HGBA1C 8.8 (A) 07/21/2020    Review of Glycemic Control Results for Peter Mendez, Peter Mendez (MRN [id number]) as of 09/17/2020 11:09  Ref. Range 09/16/2020 11:40 09/16/2020 16:35 09/16/2020 21:25 09/17/2020 06:36  Glucose-Capillary Latest Ref Range: 70 - 99 mg/dL 61 (L) 222 (H) 411 (H) 73   Current DM meds: Lantus 5 units BID Novolog 0-9 units TID  Inpatient Diabetes Program Recommendations:    Might consider discontinuing Lantus for now as fasting was 73 mg/dL this morning and will be NPO after MN for procedure.  Will continue to follow while inpatient.  Thank you, Dulce Sellar, RN, BSN Diabetes Coordinator Inpatient Diabetes Program [phone] (team pager from 8a-5p)

## 2020-09-17 NOTE — Progress Notes (Signed)
Subjective: ON: Patient transitioned to Sinus Bradycardia. Delirious. Resumed home Xanax at reduced dose.   Patient denies headache, chest pain, sob. Pain unchanged from day prior on mildly reduced pain meds. Expressed ready for surgery tomorrow.    Objective:  Vital signs in last 24 hours: Vitals:   09/17/20 0500 09/17/20 0507 09/17/20 0800 09/17/20 1215  BP:  111/70 (!) 128/54 (!) 146/62  Pulse: (!) 44 (!) 57    Resp:  17  18  Temp:  98 F (36.7 C) 98.1 F (36.7 C) 98 F (36.7 C)  TempSrc:  Oral  Oral  SpO2: 100% 100%  99%  Weight:  63.2 kg    Height:       Weight change: -10.1 kg  Intake/Output Summary (Last 24 hours) at 09/17/2020 1430 Last data filed at 09/17/2020 0800 Gross per 24 hour  Intake 2155.8 ml  Output --  Net 2155.8 ml     Physical Exam Constitutional:      General: [name] not in acute distress.    Appearance: [name] not ill-appearing, toxic-appearing or diaphoretic.  Cardiovascular:     Rate and Rhythm: Regular rhythm. Bradycardia present.  Pulmonary:     Effort: Pulmonary effort is normal. No respiratory distress.  Musculoskeletal:     Comments: Extremities warm  Neurological:     Mental Status: [name] alert and oriented to person, place, and time.     Comments: Patient alert to person, place, and situation. Near his baseline.      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 New onset a fib, a flutter 2/7; transitioned to sinus bradycardia 2/10.Plans for BKA tomorrow.    Arterial Disease with Infrarenal Aortic Occlusion, Bilateral Common and Femoral Occlusions  Dry Gangrene with Moderate Cellulitis RLE Superficial Femoral Artery Occlusions -Bilateral  S/paorta bifemoral and right fem-pop bypasson 2/04.No systemic signs or symptomsof infection. 5 days on Vancomycin; 6 days on ceftriaxone. Blood cultures without growth to date.  -Orthopedics and Vascular following -s/prevascularizationon 2/04(aortobifemoral bypass and right fem pop bypass) -Proceeding with transtibial amputation tomorrow  -Spiritual Care consulted -PT/OT following -recommending CIR.  -Cardiology following -ContinueCrestor 20mg  -ContinueAspirin 81 -morphine 1mg  q1prn,percocet q6prn,scheduled Tylenol BID.     Thrombocytopenia Patient with new onset drop in platelets to 111 from 201 the day prior. Notably has been on heparin DVT prophylaxis since admission; started on heparin drip two days ago for post-op afib. Intermediate risk per 4T score. Additionally some drop in Hgb to 9.6 from 11.7 the day prior. LDH mildly elevated at 217.  -Stopped Heparin -f/u HIT antibody -f/u Haptoglobin -f/u blood smear report    Post-op A-fib / A-flutter with RVR Patient converted to sinus bradycardia overnight. Stopped Amiodarone. 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 Possibly 2/2 tea and toast diet; and mild SIADH.Mild at 133 today -continued monitoring   Tobacco Use -nicotine patch   HTN -Holdingramipril10mg  -HoldingNorvasc 10mg     LOS: 9 days   , Medical Student 09/17/2020, 2:30 PM

## 2020-09-17 NOTE — Progress Notes (Signed)
Progress Note  Patient Name: Peter Mendez. Date of Encounter: 09/17/2020  [facility] Cardiologist: No primary care provider on file.   Subjective   Confused, mittens in place, has converted back to sinus rhythm.  Sinus bradycardia.  Inpatient Medications    Scheduled Meds: . sodium chloride   Intravenous Once  . acetaminophen  650 mg Oral BID  . aspirin EC  81 mg Oral Daily  . chlorhexidine  15 mL Mouth Rinse BID  . Chlorhexidine Gluconate Cloth  6 each Topical Daily  . gabapentin  300 mg Oral TID  . insulin aspart  0-9 Units Subcutaneous TID WC  . insulin glargine  5 Units Subcutaneous BID  . mouth rinse  15 mL Mouth Rinse q12n4p  . metoprolol tartrate  50 mg Oral BID  . nicotine  21 mg Transdermal Daily  . pantoprazole  40 mg Oral Daily  . potassium chloride  40 mEq Oral BID  . rosuvastatin  20 mg Oral Daily  . senna-docusate  1 tablet Oral Daily  . sodium chloride flush  3 mL Intravenous Q12H   Continuous Infusions: . sodium chloride    . sodium chloride 50 mL/hr at 09/16/20 2015  . sodium chloride    . amiodarone Stopped (09/16/20 2151)  . magnesium sulfate bolus IVPB     PRN Meds: sodium chloride, sodium chloride, acetaminophen **OR** acetaminophen, ALPRAZolam, labetalol, magnesium sulfate bolus IVPB, metoprolol tartrate, morphine injection, ondansetron, oxyCODONE-acetaminophen, phenol, sodium chloride flush   Vital Signs    Vitals:   09/17/20 0400 09/17/20 0500 09/17/20 0507 09/17/20 0800  BP:   111/70 (!) 128/54  Pulse: (!) 46 (!) 44 (!) 57   Resp:   17   Temp:   98 F (36.7 C) 98.1 F (36.7 C)  TempSrc:   Oral   SpO2: 100% 100% 100%   Weight:   63.2 kg   Height:        Intake/Output Summary (Last 24 hours) at 09/17/2020 1059 Last data filed at 09/17/2020 0800 Gross per 24 hour  Intake 2155.8 ml  Output -  Net 2155.8 ml   Last 3 Weights 09/17/2020 09/16/2020 09/14/2020  Weight (lbs) 139 lb 5.3 oz 161 lb 9.6 oz 128 lb 12 oz  Weight (kg) 63.2  kg 73.3 kg 58.4 kg      Telemetry    Sinus rhythm/sinus bradycardia heart rate in the 50s- Personally Reviewed  ECG    09/16/2020 at 10:30 PM-sinus bradycardia 53- Personally Reviewed  Physical Exam   GEN:  Delirious, mittens.   Neck: No JVD Cardiac:  Bradycardic regular, no murmurs, rubs, or gallops.  Respiratory: Clear to auscultation bilaterally. GI: Soft, nontender, non-distended  MS: No edema; No deformity. Neuro:   Mild facial droop noted, this has been worked up Psych: [employer] Troponin:  No results for input(s): TROPONINIHS in the last 720 hours.    Chemistry Recent Labs  Lab 09/11/20 0604 09/11/20 0857 09/12/20 0500 09/13/20 0228 09/14/20 0343 09/15/20 0128 09/16/20 0649 09/17/20 0319  NA 134*   < > 133* 138   < > 134* 132* 133*  K 3.8   < > 3.5 3.3*   < > 4.0 3.3* 3.3*  CL 93*   < > 97* 102   < > 96* 96* 100  CO2 28   < > 24 26   < > 27 27 25   GLUCOSE 78   < > 262* 113*   < >  184* 154* 89  BUN 8   < > 9 13   < > 20 17 17   CREATININE 0.67   < > 0.62 0.61   < > 0.70 0.72 0.83  CALCIUM 8.8*   < > 8.4* 8.2*   < > 8.3* 8.0* 8.1*  PROT 6.5  --  5.6* 5.5*  --   --   --   --   ALBUMIN 2.7*  --  2.8* 2.5*  --   --   --   --   AST 27  --  27 53*  --   --   --   --   ALT 12  --  13 12  --   --   --   --   ALKPHOS 101  --  72 65  --   --   --   --   BILITOT 0.5  --  0.8 0.6  --   --   --   --   GFRNONAA >60   < > >60 >60   < > >60 >60 >60  ANIONGAP 13   < > 12 10   < > 11 9 8    < > = values in this interval not displayed.     Hematology Recent Labs  Lab 09/15/20 0128 09/16/20 0649 09/17/20 0319  WBC 8.6 9.7 10.0  RBC 3.71* 3.96* 3.38*  HGB 10.6* 11.7* 9.6*  HCT 31.5* 32.9* 28.7*  MCV 84.9 83.1 84.9  MCH 28.6 29.5 28.4  MCHC 33.7 35.6 33.4  RDW 14.1 14.1 14.1  PLT 229 201 111*    BNPNo results for input(s): BNP, PROBNP in the last 168 hours.   DDimer No results for input(s): DDIMER in the last 168 hours.   Radiology     CT HEAD WO CONTRAST  Result Date: 09/16/2020 CLINICAL DATA:  60 year old male with acute confusion and agitation. Neurologic deficit. EXAM: CT HEAD WITHOUT CONTRAST TECHNIQUE: Contiguous axial images were obtained from the base of the skull through the vertex without intravenous contrast. COMPARISON:  None. FINDINGS: Brain: Cerebral volume is within normal limits for age. No midline shift, ventriculomegaly, mass effect, evidence of mass lesion, intracranial hemorrhage or evidence of cortically based acute infarction. Gray-white matter differentiation is within normal limits throughout the brain. Mild dural calcification incidentally noted. No encephalomalacia identified. Vascular: Calcified atherosclerosis at the skull base. No suspicious intracranial vascular hyperdensity. Skull: No acute osseous abnormality identified. Sinuses/Orbits: Largely clear paranasal sinuses, middle ears and mastoids. There is mild ethmoid mucosal thickening. Other: No acute orbit or scalp soft tissue finding. IMPRESSION: 1. Normal for age non contrast CT appearance of the brain. 2. Mild ethmoid sinus inflammation. Electronically Signed   By: 11/14/2020 M.D.   On: 09/16/2020 04:21   MR BRAIN W WO CONTRAST  Result Date: 09/16/2020 CLINICAL DATA:  60 year old male with acute confusion and agitation. Neurologic deficit. EXAM: MRI HEAD WITHOUT AND WITH CONTRAST TECHNIQUE: Multiplanar, multiecho pulse sequences of the brain and surrounding structures were obtained without and with intravenous contrast. CONTRAST:  7.12mL GADAVIST GADOBUTROL 1 MMOL/ML IV SOLN COMPARISON:  Head CT 0415 hours today. FINDINGS: Brain: Cerebral volume is within normal limits for age. No restricted diffusion to suggest acute infarction. No midline shift, mass effect, evidence of mass lesion, ventriculomegaly, extra-axial collection or acute intracranial hemorrhage. Cervicomedullary junction and pituitary are within normal limits. 77 and white matter signal is  largely normal for age throughout the brain. No cortical encephalomalacia or chronic cerebral blood  products. Mild T2 heterogeneity in both thalami, suspect small chronic thalamic lacunar infarct particularly medial on the right series 10, image 14. Tiny chronic right cerebellar infarct is possible on series 10, image 8. No abnormal enhancement identified.  No dural thickening. Vascular: Major intracranial vascular flow voids are preserved. The major dural venous sinuses are enhancing and appear to be patent. Skull and upper cervical spine: Negative. Visualized bone marrow signal is within normal limits. Sinuses/Orbits: Postoperative changes to both globes. Otherwise negative. Other: Grossly normal visible internal auditory structures. Mastoids are clear. Scalp and face soft tissues appear negative. IMPRESSION: 1. No acute intracranial abnormality. 2. 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 Electronically Signed   By: Odessa Fleming M.D.   On: 09/16/2020 06:49   VAS US AORTA/IVC/ILIACS  Result Date: 09/15/2020 IVC/ILIAC STUDY Indications: 2/2 Possible IVC thrombus on Echo Risk Factors: Hypertension, Diabetes, current smoker. Other Factors: Known PAD, Patient Consult for heparin placed 2/8. Vascular Interventions: Aorta Bifem 2/4                         Right Fem-pop 2/4.  Comparison Study: No previous Performing Technologist: Clint Guy RVT  Examination Guidelines: A complete evaluation includes B-mode imaging, spectral Doppler, color Doppler, and power Doppler as needed of all accessible portions of each vessel. Bilateral testing is considered an integral part of a complete examination. Limited examinations for reoccurring indications may be performed as noted.  IVC/Iliac Findings: +----------+------+--------+--------+    IVC    PatentThrombusComments +----------+------+--------+--------+ IVC Prox  patent                 +----------+------+--------+--------+  IVC Mid   patent                 +----------+------+--------+--------+ IVC Distalpatent                 +----------+------+--------+--------+  +-------------------+---------+-----------+---------+-----------+--------+         CIV        RT-PatentRT-ThrombusLT-PatentLT-ThrombusComments +-------------------+---------+-----------+---------+-----------+--------+ Common Iliac Prox   patent              patent                      +-------------------+---------+-----------+---------+-----------+--------+ Common Iliac Mid    patent              patent                      +-------------------+---------+-----------+---------+-----------+--------+ Common Iliac Distal patent              patent                      +-------------------+---------+-----------+---------+-----------+--------+  +-------------------------+---------+-----------+---------+-----------+--------+            EIV           RT-PatentRT-ThrombusLT-PatentLT-ThrombusComments +-------------------------+---------+-----------+---------+-----------+--------+ External Iliac Vein Prox  patent              patent                      +-------------------------+---------+-----------+---------+-----------+--------+ External Iliac Vein Mid   patent              patent                      +-------------------------+---------+-----------+---------+-----------+--------+ External Iliac Vein       patent  patent                      Distal                                                                    +-------------------------+---------+-----------+---------+-----------+--------+  Summary: IVC/Iliac: No evidence of thrombus in IVC and Iliac veins. There is no evidence of thrombus involving the right common iliac vein and left common iliac vein. There is no evidence of thrombus involving the right external iliac vein and left external iliac  vein.  *See table(s) above for measurements  and observations.  Electronically signed by Waverly Ferrari MD on 09/15/2020 at 8:59:58 PM.    Final      Patient Profile     60 y.o. male postop aortobifemoral bypass and right common femoral to below-knee popliteal bypass with postoperative atrial fibrillation/flutter, gangrene, awaiting BKA Friday Dr. Lajoyce Corners  Assessment & Plan    Postop paroxysmal atrial fibrillation/flutter -He has converted, now sinus bradycardia.  Stable. -The amiodarone has been stopped. -On metoprolol 50 mg twice a day, last 2 doses have been held because of heart rates less than 60. -IV heparin.  Delirium -Mittens.  Confused.  Severe PVD -Gangrene, Dr. Lajoyce Corners 2 OR Friday.  Okay to proceed from cardiac perspective.  There was no IVC thrombus.  For questions or updates, please contact [facility] Please consult [email or web address] for contact info under        Signed, Donato Schultz, MD  09/17/2020, 10:59 AM

## 2020-09-17 NOTE — Progress Notes (Signed)
This chaplain responded to consult for spiritual care before BKA on Friday.  The chaplain was updated by the healthcare team before the visit.    After the chaplain introduction, the Pt. shared his need for bathroom assistance.  The NT is present and the chaplain will return to continue spiritual care visit.

## 2020-09-17 NOTE — Progress Notes (Addendum)
Progress Note    09/17/2020 7:11 AM 6 Days Post-Op  Subjective:  Has developed post-op delirium and bradycardia. MRI negative for acute findings. When asked if he remebmbers me he says yes and that my name is Dr. Lajoyce Corners   Vitals:   09/17/20 0500 09/17/20 0507  BP:  111/70  Pulse: (!) 44 (!) 57  Resp:  17  Temp:  98 F (36.7 C)  SpO2: 100% 100%   EKG overnight: sinus brady with PAC  Physical Exam: General appearance: Awake, alert in no apparent distress. Right sided gaze. will track examiner. Cardiac: Heart rate and rhythm are regular during my exam Respirations: Nonlabored Incisions: Midline, bilateral groin, right thigh and lower leg incisions are all well approximated. The Bk popliteal incision bandage is stained with serosanguinous fluid. No signs of infection.  Extremities: Both feet are warm with intact sensation. Minor decrease in motor function of right toes.  Brisk DP. PT and peroneal Doppler signals. Abdomen: soft, ND, + BS   CBC    Component Value Date/Time   WBC 10.0 09/17/2020 0319   RBC 3.38 (L) 09/17/2020 0319   HGB 9.6 (L) 09/17/2020 0319   HCT 28.7 (L) 09/17/2020 0319   PLT 111 (L) 09/17/2020 0319   MCV 84.9 09/17/2020 0319   MCH 28.4 09/17/2020 0319   MCHC 33.4 09/17/2020 0319   RDW 14.1 09/17/2020 0319   LYMPHSABS 1.4 09/09/2020 0550   MONOABS 0.7 09/09/2020 0550   EOSABS 0.2 09/09/2020 0550   BASOSABS 0.0 09/09/2020 0550    BMET    Component Value Date/Time   NA 133 (L) 09/17/2020 0319   K 3.3 (L) 09/17/2020 0319   CL 100 09/17/2020 0319   CO2 25 09/17/2020 0319   GLUCOSE 89 09/17/2020 0319   BUN 17 09/17/2020 0319   CREATININE 0.83 09/17/2020 0319   CALCIUM 8.1 (L) 09/17/2020 0319   GFRNONAA >60 09/17/2020 0319     Intake/Output Summary (Last 24 hours) at 09/17/2020 0711 Last data filed at 09/17/2020 0304 Gross per 24 hour  Intake 1837.46 ml  Output -  Net 1837.46 ml    HOSPITAL MEDICATIONS Scheduled Meds: . sodium chloride    Intravenous Once  . acetaminophen  650 mg Oral BID  . aspirin EC  81 mg Oral Daily  . chlorhexidine  15 mL Mouth Rinse BID  . Chlorhexidine Gluconate Cloth  6 each Topical Daily  . gabapentin  300 mg Oral TID  . insulin aspart  0-9 Units Subcutaneous TID WC  . insulin glargine  10 Units Subcutaneous BID  . mouth rinse  15 mL Mouth Rinse q12n4p  . metoprolol tartrate  50 mg Oral BID  . nicotine  21 mg Transdermal Daily  . pantoprazole  40 mg Oral Daily  . potassium chloride  40 mEq Oral BID  . rosuvastatin  20 mg Oral Daily  . senna-docusate  1 tablet Oral Daily   Continuous Infusions: . sodium chloride    . sodium chloride 50 mL/hr at 09/16/20 2015  . amiodarone Stopped (09/16/20 2151)  . heparin 1,000 Units/hr (09/16/20 2014)  . magnesium sulfate bolus IVPB 1 g (09/17/20 0642)  . magnesium sulfate bolus IVPB     PRN Meds:.sodium chloride, acetaminophen **OR** acetaminophen, ALPRAZolam, labetalol, magnesium sulfate bolus IVPB, metoprolol tartrate, morphine injection, ondansetron, oxyCODONE-acetaminophen, phenol  MRI brain: 2/9 IMPRESSION: 1. No acute intracranial abnormality. 2. 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 RLE well perfused. Non-viable right heel wound. VSS. Afebrile. Hgb drift from 11.7 to 9.6 without signs of active bleeding. Platelets 111  Delirium>likely metabolic encephalopathy   -DVT prophylaxis:  Heparin infusion   Wendi Maya, PA-C [facility] [phone] 09/17/2020  7:11 AM   I have independently interviewed and examined patient and agree with PA assessment and plan above.  He appears confused on my exam today however he is redirectable and understands that Dr. Lajoyce Corners will be performing right  lower extremity amputation tomorrow.  Klyde Banka C. Randie Heinz, MD [facility] [place] Office: [phone] Pager: [phone]

## 2020-09-17 NOTE — Progress Notes (Signed)
Bladder scanned = 284 ml. Continue to monitor.  Filiberto Pinks, RN

## 2020-09-17 NOTE — H&P (View-Only) (Signed)
Patient ID: Peter T Oscar Jr., male   DOB: 11/26/1960, 60 y.o.   MRN: [id number] Patient is seen in follow-up he is status post revascularization to both lower extremities.  The ischemic bulla on the leg are healing nicely.  The soft tissue gangrenous necrosis over the heel and calcaneus are full-thickness.  Patient has had episodes over the last day with anxiety.  Patient is calm this morning discussed recommendations to proceed with the right transtibial amputation tomorrow morning patient states he understands and wishes to proceed with surgery. 

## 2020-09-17 NOTE — Progress Notes (Signed)
Patient ID: Peter Newness., male   DOB: 1960/09/05, 60 y.o.   MRN: [id number] Patient is seen in follow-up he is status post revascularization to both lower extremities.  The ischemic bulla on the leg are healing nicely.  The soft tissue gangrenous necrosis over the heel and calcaneus are full-thickness.  Patient has had episodes over the last day with anxiety.  Patient is calm this morning discussed recommendations to proceed with the right transtibial amputation tomorrow morning patient states he understands and wishes to proceed with surgery.

## 2020-09-17 NOTE — Progress Notes (Signed)
Physical Therapy Treatment Patient Details Name: Peter Mendez. MRN: [id number] DOB: 02-28-1961 Today's Date: 09/17/2020    History of Present Illness 60 yo s/p aortobifem bypass graft, R fem pop bypass due to critical limb ischemia. PMH PVD, RLE dry gangrene, neuropathy, autoimmune diabetes, tobacco use, HTN, vision impairment.    PT Comments    Pt received in supine, mitts donned but A&O x2-3, some anxiety but good participation as able. Primary session focus on seated/standing activities for transfer training and BLE strengthening, pt with improved 1-step command following this date although admits he has felt confused over past few days. This therapist asked nursing staff to obtain soft touch call bell for his room as he has been waxing/waning with delirium and currently cooperative. Pt needing +2 assist for safety due to confusion/poor standing balance but motivated to progress mobility and reports pain slightly improved after session. Pt continues to benefit from PT services to progress toward functional mobility goals, continue to recommend CIR.   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, fully blind L eye Restrictions Weight Bearing Restrictions: No    Mobility  Bed Mobility Overal bed mobility: Needs Assistance Bed Mobility: Rolling;Sidelying to Sit;Sit to Sidelying Rolling: Min guard Sidelying to sit: Min assist     Sit to sidelying: Min assist General bed mobility comments: minA for trunk rise and max cues for safety with log rolling due to abd incision, able to advance LLE but difficulty advancing RLE due to pain/weakness    Transfers Overall transfer level:  Needs assistance Equipment used: Rolling walker (2 wheeled) Transfers: Sit to/from Stand;Lateral/Scoot Transfers Sit to Stand: Mod assist;+2 physical assistance;Min assist Stand pivot transfers: Mod assist;+2 physical assistance      Lateral/Scoot Transfers: Min guard General transfer comment: from EOB to stand with BUE support and +58modA and BUE HHA to pivot to [facility], then from [facility] to RW, +2 for safety/equipment mostly pivoting back; once EOB, lateral scooting performed to foot of bed/head of bed with min guard and no LOB  Ambulation/Gait Ambulation/Gait assistance: Mod assist;+2 safety/equipment Gait Distance (Feet): 5 Feet Assistive device: Rolling walker (2 wheeled) Gait Pattern/deviations: Decreased stride length;Leaning posteriorly;Step-to pattern;Shuffle     General Gait Details: pt with increased posterior lean this date and poor use of RW, needs max cues for posture/attention to task and sidestepping along EOB only due to RLE pain/fatigue   Stairs             Wheelchair Mobility    Modified Rankin (Stroke Patients Only)       Balance Overall balance assessment: Needs assistance Sitting-balance support: Feet supported;Single extremity supported Sitting balance-Leahy Scale: Fair Sitting balance - Comments: pt needs cues for posture, able to self-correct with cues; mostly Supervision for seated balance and scooting EOB up to min guard   Standing balance support: Bilateral upper extremity supported Standing balance-Leahy Scale: Poor Standing balance comment: reliant on RW and external assist                            Cognition Arousal/Alertness: Awake/alert Behavior During Therapy: Anxious Overall Cognitive Status: Impaired/Different from baseline Area of Impairment: Attention;Memory;Following commands;Safety/judgement;Problem solving;Orientation  Orientation Level: Time;Disoriented to   Memory: Decreased recall of  precautions;Decreased short-term memory Following Commands: Follows one step commands consistently;Follows one step commands with increased time Safety/Judgement: Decreased awareness of safety;Decreased awareness of deficits   Problem Solving: Difficulty sequencing;Requires verbal cues;Requires tactile cues General Comments: internally distracted, esp due to upcoming amputation Fri; participatory with encouragement and improved command following this date but still needs +2 due to cognitive delay/difficulty problem solving      Exercises General Exercises - Lower Extremity Ankle Circles/Pumps: AROM;10 reps;Left;Supine (unable on R) Long Arc Quad: AROM;Both;10 reps;Seated;AAROM (AA on R) Hip Flexion/Marching: AROM;Both;Seated;10 reps    General Comments General comments (skin integrity, edema, etc.): some drainage for RLE dressing/gauze falling off during transfers and when pt stood, condom cath fell off, NT notified pt will need new condom cath placed; pt assisted to make outgoing call to his mother and he left voicemail, [place] notified pt needs soft call bell for room as he currently has mitts in place due to impaired cognition/waxing and waning delirium; HR 66 to 70's bpm, SpO2 91-100% (poor pleth reading at times), BP 146/62 supine      Pertinent Vitals/Pain Pain Assessment: 0-10 Pain Score: 9  Faces Pain Scale: Hurts whole lot Pain Location: RLE>abdomen Pain Descriptors / Indicators: Grimacing;Guarding;Discomfort;Sharp Pain Intervention(s): Monitored during session;Repositioned;Patient requesting pain meds-RN notified (RLE elevated)    Home Living                      Prior Function            PT Goals (current goals can now be found in the care plan section) Acute Rehab PT Goals Patient Stated Goal: to be as indep as possible PT Goal Formulation: With patient Time For Goal Achievement: 09/26/20 Potential to Achieve Goals: Good Progress towards PT goals: Progressing  toward goals    Frequency    Min 3X/week      PT Plan Current plan remains appropriate    Co-evaluation              AM-PAC PT "6 Clicks" Mobility   Outcome Measure  Help needed turning from your back to your side while in a flat bed without using bedrails?: A Little Help needed moving from lying on your back to sitting on the side of a flat bed without using bedrails?: A Little Help needed moving to and from a bed to a chair (including a wheelchair)?: A Lot Help needed standing up from a chair using your arms (e.g., wheelchair or bedside chair)?: A Lot Help needed to walk in hospital room?: A Lot Help needed climbing 3-5 steps with a railing? : Total 6 Click Score: 13    End of Session Equipment Utilized During Treatment: Gait belt Activity Tolerance: Patient tolerated treatment well Patient left: in bed;with bed alarm set;with call bell/phone within reach;with restraints reapplied (mitts on) Nurse Communication: Mobility status PT Visit Diagnosis: Unsteadiness on feet (R26.81);Muscle weakness (generalized) (M62.81);Pain Pain - part of body:  (R leg)     Time: 8315-1761 PT Time Calculation (min) (ACUTE ONLY): 33 min  Charges:  $Gait Training: 8-22 mins $Therapeutic Activity: 8-22 mins                     Arnecia Ector P., PTA Acute Rehabilitation Services Pager: [phone] Office: [phone]   Angus Palms 09/17/2020, 2:37 PM

## 2020-09-17 NOTE — Progress Notes (Signed)
Hypoglycemic Event  CBG: 59  Treatment: 8 oz juice/soda  Symptoms: None  Follow-up CBG: Time:1642 CBG Result:87  Possible Reasons for Event: Inadequate meal intake  Comments/MD notified:yes     Thomas Hoff

## 2020-09-17 NOTE — Progress Notes (Signed)
Held morning dose of metoprolol for bradycardia. Beta blocker was held last night for bradycardia and hypotension. MD aware.  Will request parameters for medication. Thomas Hoff, RN

## 2020-09-17 NOTE — Progress Notes (Signed)
HR 40s, BP 95/71, held Lopressor at bedtime. Stopped Amiodarone. EKG 12 lead showed sinus bradycardia with PAC. Internal medicine notified.   Pt was very confused tonight. We do not have a safety sitter available after 11 pm. Pt pulled IV catheter out and tried to get out of bed. MD made aware. Order received for soft wrist restrained. Non-violent restrained protocol started.   No record urin out put since day shift. Bladder scanned 254 ml at 03:00 am.. Will monitor.  Filiberto Pinks, RN

## 2020-09-18 ENCOUNTER — Inpatient Hospital Stay (HOSPITAL_COMMUNITY): Payer: Medicare Other | Admitting: Certified Registered Nurse Anesthetist

## 2020-09-18 ENCOUNTER — Encounter (HOSPITAL_COMMUNITY)
Admission: EM | Disposition: A | Discharge status: Home Health | Payer: Self-pay | Source: Home / Self Care | Attending: Internal Medicine

## 2020-09-18 DIAGNOSIS — I96 Gangrene, not elsewhere classified: Secondary | ICD-10-CM | POA: Diagnosis not present

## 2020-09-18 DIAGNOSIS — Y92239 Unspecified place in hospital as the place of occurrence of the external cause: Secondary | ICD-10-CM

## 2020-09-18 DIAGNOSIS — T45515A Adverse effect of anticoagulants, initial encounter: Secondary | ICD-10-CM

## 2020-09-18 DIAGNOSIS — I480 Paroxysmal atrial fibrillation: Secondary | ICD-10-CM | POA: Diagnosis not present

## 2020-09-18 DIAGNOSIS — D6959 Other secondary thrombocytopenia: Secondary | ICD-10-CM

## 2020-09-18 DIAGNOSIS — T148XXA Other injury of unspecified body region, initial encounter: Secondary | ICD-10-CM | POA: Diagnosis not present

## 2020-09-18 DIAGNOSIS — M79604 Pain in right leg: Secondary | ICD-10-CM | POA: Diagnosis not present

## 2020-09-18 DIAGNOSIS — I9789 Other postprocedural complications and disorders of the circulatory system, not elsewhere classified: Secondary | ICD-10-CM

## 2020-09-18 HISTORY — PX: AMPUTATION: SHX166

## 2020-09-18 LAB — CBC
HCT: 30.5 % — ABNORMAL LOW (ref 39.0–52.0)
Hemoglobin: 10.3 g/dL — ABNORMAL LOW (ref 13.0–17.0)
MCH: 29.2 pg (ref 26.0–34.0)
MCHC: 33.8 g/dL (ref 30.0–36.0)
MCV: 86.4 fL (ref 80.0–100.0)
Platelets: 84 10*3/uL — ABNORMAL LOW (ref 150–400)
RBC: 3.53 MIL/uL — ABNORMAL LOW (ref 4.22–5.81)
RDW: 14.7 % (ref 11.5–15.5)
WBC: 12.3 10*3/uL — ABNORMAL HIGH (ref 4.0–10.5)
nRBC: 0 % (ref 0.0–0.2)

## 2020-09-18 LAB — BASIC METABOLIC PANEL
Anion gap: 8 (ref 5–15)
BUN: 10 mg/dL (ref 8–23)
CO2: 23 mmol/L (ref 22–32)
Calcium: 7.8 mg/dL — ABNORMAL LOW (ref 8.9–10.3)
Chloride: 101 mmol/L (ref 98–111)
Creatinine, Ser: 0.71 mg/dL (ref 0.61–1.24)
GFR, Estimated: >60 mL/min (ref >60)
Glucose, Bld: 224 mg/dL — ABNORMAL HIGH (ref 70–99)
Potassium: 4.1 mmol/L (ref 3.5–5.1)
Sodium: 132 mmol/L — ABNORMAL LOW (ref 135–145)

## 2020-09-18 LAB — HEPARIN INDUCED PLATELET AB (HIT ANTIBODY): Heparin Induced Plt Ab: 2.599 OD — ABNORMAL HIGH (ref 0.000–0.400)

## 2020-09-18 LAB — GLUCOSE, CAPILLARY
Glucose-Capillary: 223 mg/dL — ABNORMAL HIGH (ref 70–99)
Glucose-Capillary: 191 mg/dL — ABNORMAL HIGH (ref 70–99)
Glucose-Capillary: 273 mg/dL — ABNORMAL HIGH (ref 70–99)
Glucose-Capillary: 342 mg/dL — ABNORMAL HIGH (ref 70–99)
Glucose-Capillary: 343 mg/dL — ABNORMAL HIGH (ref 70–99)
Glucose-Capillary: 324 mg/dL — ABNORMAL HIGH (ref 70–99)

## 2020-09-18 LAB — HAPTOGLOBIN: Haptoglobin: 249 mg/dL (ref 32–363)

## 2020-09-18 LAB — APTT: aPTT: 34 seconds (ref 24–36)

## 2020-09-18 LAB — MAGNESIUM: Magnesium: 1.9 mg/dL (ref 1.7–2.4)

## 2020-09-18 LAB — PROTIME-INR
INR: 1.2 (ref 0.8–1.2)
Prothrombin Time: 14.4 seconds (ref 11.4–15.2)

## 2020-09-18 LAB — PATHOLOGIST SMEAR REVIEW

## 2020-09-18 SURGERY — AMPUTATION BELOW KNEE
Anesthesia: Monitor Anesthesia Care | Site: Knee | Laterality: Right

## 2020-09-18 MED ORDER — ONDANSETRON HCL 4 MG/2ML IJ SOLN
INTRAMUSCULAR | Status: AC
  Filled 2020-09-18: qty 2

## 2020-09-18 MED ORDER — AMISULPRIDE (ANTIEMETIC) 5 MG/2ML IV SOLN: 10.0000 mg | Freq: Once | INTRAVENOUS | Status: DC | PRN

## 2020-09-18 MED ORDER — MIDAZOLAM HCL 2 MG/2ML IJ SOLN
INTRAMUSCULAR | Status: AC
  Administered 2020-09-18: 2 mg via INTRAVENOUS
  Filled 2020-09-18: qty 2

## 2020-09-18 MED ORDER — INSULIN ASPART 100 UNIT/ML ~~LOC~~ SOLN
10.0000 [IU] | Freq: Once | SUBCUTANEOUS | Status: AC
  Administered 2020-09-18: 10 [IU] via SUBCUTANEOUS

## 2020-09-18 MED ORDER — DEXAMETHASONE SODIUM PHOSPHATE 10 MG/ML IJ SOLN
INTRAMUSCULAR | Status: DC | PRN
  Administered 2020-09-18: 5 mg via INTRAVENOUS

## 2020-09-18 MED ORDER — BUPIVACAINE LIPOSOME 1.3 % IJ SUSP
INTRAMUSCULAR | Status: DC | PRN
  Administered 2020-09-18: 10 mL

## 2020-09-18 MED ORDER — LACTATED RINGERS IV SOLN: INTRAVENOUS | Status: DC | PRN

## 2020-09-18 MED ORDER — ROCURONIUM BROMIDE 10 MG/ML (PF) SYRINGE
PREFILLED_SYRINGE | INTRAVENOUS | Status: AC
  Filled 2020-09-18: qty 10

## 2020-09-18 MED ORDER — PROPOFOL 500 MG/50ML IV EMUL
INTRAVENOUS | Status: DC | PRN
  Administered 2020-09-18: 50 ug/kg/min via INTRAVENOUS

## 2020-09-18 MED ORDER — INSULIN GLARGINE 100 UNIT/ML ~~LOC~~ SOLN
5.0000 [IU] | Freq: Two times a day (BID) | SUBCUTANEOUS | Status: DC
  Administered 2020-09-18 (×2): 5 [IU] via SUBCUTANEOUS
  Filled 2020-09-18 (×5): qty 0.05

## 2020-09-18 MED ORDER — SODIUM CHLORIDE 0.9 % IV SOLN: INTRAVENOUS | Status: DC

## 2020-09-18 MED ORDER — EPHEDRINE 5 MG/ML INJ
INTRAVENOUS | Status: AC
  Filled 2020-09-18: qty 10

## 2020-09-18 MED ORDER — DEXAMETHASONE SODIUM PHOSPHATE 10 MG/ML IJ SOLN
INTRAMUSCULAR | Status: AC
  Filled 2020-09-18: qty 1

## 2020-09-18 MED ORDER — CHLORHEXIDINE GLUCONATE 0.12 % MT SOLN
OROMUCOSAL | Status: AC
  Administered 2020-09-18: 15 mL via OROMUCOSAL
  Filled 2020-09-18: qty 15

## 2020-09-18 MED ORDER — PROPOFOL 10 MG/ML IV BOLUS
INTRAVENOUS | Status: AC
  Filled 2020-09-18: qty 20

## 2020-09-18 MED ORDER — MIDAZOLAM HCL 2 MG/2ML IJ SOLN
INTRAMUSCULAR | Status: AC
  Filled 2020-09-18: qty 2

## 2020-09-18 MED ORDER — FENTANYL CITRATE (PF) 100 MCG/2ML IJ SOLN
INTRAMUSCULAR | Status: AC
  Administered 2020-09-18: 50 ug via INTRAVENOUS
  Filled 2020-09-18: qty 2

## 2020-09-18 MED ORDER — CEFAZOLIN SODIUM-DEXTROSE 2-4 GM/100ML-% IV SOLN: 2.0000 g | INTRAVENOUS | Status: DC

## 2020-09-18 MED ORDER — OXYCODONE HCL 5 MG PO TABS: 5.0000 mg | ORAL_TABLET | Freq: Once | ORAL | Status: DC | PRN

## 2020-09-18 MED ORDER — MIDAZOLAM HCL 2 MG/2ML IJ SOLN: 2.0000 mg | Freq: Once | INTRAMUSCULAR | Status: AC

## 2020-09-18 MED ORDER — CEFAZOLIN SODIUM-DEXTROSE 1-4 GM/50ML-% IV SOLN
1.0000 g | Freq: Four times a day (QID) | INTRAVENOUS | Status: AC
  Administered 2020-09-18 – 2020-09-19 (×3): 1 g via INTRAVENOUS
  Filled 2020-09-18 (×3): qty 50

## 2020-09-18 MED ORDER — OXYCODONE HCL 5 MG/5ML PO SOLN: 5.0000 mg | Freq: Once | ORAL | Status: DC | PRN

## 2020-09-18 MED ORDER — 0.9 % SODIUM CHLORIDE (POUR BTL) OPTIME
TOPICAL | Status: DC | PRN
  Administered 2020-09-18: 1000 mL

## 2020-09-18 MED ORDER — ONDANSETRON HCL 4 MG/2ML IJ SOLN: 4.0000 mg | Freq: Once | INTRAMUSCULAR | Status: DC | PRN

## 2020-09-18 MED ORDER — LIDOCAINE 2% (20 MG/ML) 5 ML SYRINGE
INTRAMUSCULAR | Status: AC
  Filled 2020-09-18: qty 5

## 2020-09-18 MED ORDER — SUCCINYLCHOLINE CHLORIDE 200 MG/10ML IV SOSY
PREFILLED_SYRINGE | INTRAVENOUS | Status: AC
  Filled 2020-09-18: qty 10

## 2020-09-18 MED ORDER — MIDAZOLAM HCL 2 MG/2ML IJ SOLN
INTRAMUSCULAR | Status: DC | PRN
  Administered 2020-09-18: 1 mg via INTRAVENOUS

## 2020-09-18 MED ORDER — MAGNESIUM SULFATE 2 GM/50ML IV SOLN
2.0000 g | Freq: Once | INTRAVENOUS | Status: AC
  Administered 2020-09-18: 2 g via INTRAVENOUS
  Filled 2020-09-18: qty 50

## 2020-09-18 MED ORDER — CEFAZOLIN SODIUM-DEXTROSE 2-3 GM-%(50ML) IV SOLR
INTRAVENOUS | Status: DC | PRN
  Administered 2020-09-18: 2 g via INTRAVENOUS

## 2020-09-18 MED ORDER — BUPIVACAINE HCL (PF) 0.5 % IJ SOLN
INTRAMUSCULAR | Status: DC | PRN
  Administered 2020-09-18 (×2): 15 mL via PERINEURAL

## 2020-09-18 MED ORDER — METOPROLOL SUCCINATE ER 50 MG PO TB24
50.0000 mg | ORAL_TABLET | Freq: Every day | ORAL | Status: DC
  Administered 2020-09-19: 50 mg via ORAL
  Filled 2020-09-18: qty 1

## 2020-09-18 MED ORDER — FONDAPARINUX SODIUM 7.5 MG/0.6ML ~~LOC~~ SOLN
7.5000 mg | SUBCUTANEOUS | Status: DC
  Administered 2020-09-18 – 2020-09-22 (×5): 7.5 mg via SUBCUTANEOUS
  Filled 2020-09-18 (×6): qty 0.6

## 2020-09-18 MED ORDER — FENTANYL CITRATE (PF) 100 MCG/2ML IJ SOLN
50.0000 ug | Freq: Once | INTRAMUSCULAR | Status: AC
Start: 2020-09-18 — End: 2020-09-18

## 2020-09-18 MED ORDER — PROPOFOL 10 MG/ML IV BOLUS
INTRAVENOUS | Status: DC | PRN
  Administered 2020-09-18: 20 mg via INTRAVENOUS

## 2020-09-18 MED ORDER — PHENYLEPHRINE 40 MCG/ML (10ML) SYRINGE FOR IV PUSH (FOR BLOOD PRESSURE SUPPORT)
PREFILLED_SYRINGE | INTRAVENOUS | Status: AC
  Filled 2020-09-18: qty 10

## 2020-09-18 MED ORDER — FONDAPARINUX SODIUM 2.5 MG/0.5ML ~~LOC~~ SOLN: 2.5000 mg | SUBCUTANEOUS | Status: DC

## 2020-09-18 MED ORDER — FENTANYL CITRATE (PF) 100 MCG/2ML IJ SOLN: 25.0000 ug | INTRAMUSCULAR | Status: DC | PRN

## 2020-09-18 SURGICAL SUPPLY — 36 items
BLADE SAW RECIP 87.9 MT (BLADE) ×2 IMPLANT
BLADE SURG 21 STRL SS (BLADE) ×2 IMPLANT
BNDG COHESIVE 6X5 TAN STRL LF (GAUZE/BANDAGES/DRESSINGS) ×1 IMPLANT
CANISTER WOUND CARE 500ML ATS (WOUND CARE) ×2 IMPLANT
COVER SURGICAL LIGHT HANDLE (MISCELLANEOUS) ×2 IMPLANT
CUFF TOURN SGL QUICK 34 (TOURNIQUET CUFF) ×2
CUFF TRNQT CYL 34X4.125X (TOURNIQUET CUFF) ×1 IMPLANT
DRAPE INCISE IOBAN 66X45 STRL (DRAPES) ×2 IMPLANT
DRAPE U-SHAPE 47X51 STRL (DRAPES) ×2 IMPLANT
DRESSING PREVENA PLUS CUSTOM (GAUZE/BANDAGES/DRESSINGS) ×1 IMPLANT
DRSG PREVENA PLUS CUSTOM (GAUZE/BANDAGES/DRESSINGS) ×2
DURAPREP 26ML APPLICATOR (WOUND CARE) ×2 IMPLANT
ELECT REM PT RETURN 9FT ADLT (ELECTROSURGICAL) ×2
ELECTRODE REM PT RTRN 9FT ADLT (ELECTROSURGICAL) ×1 IMPLANT
GLOVE BIOGEL PI IND STRL 9 (GLOVE) ×1 IMPLANT
GLOVE BIOGEL PI INDICATOR 9 (GLOVE) ×1
GLOVE SURG ORTHO 9.0 STRL STRW (GLOVE) ×2 IMPLANT
GOWN STRL REUS W/ TWL XL LVL3 (GOWN DISPOSABLE) ×2 IMPLANT
GOWN STRL REUS W/TWL XL LVL3 (GOWN DISPOSABLE) ×4
KIT BASIN OR (CUSTOM PROCEDURE TRAY) ×2 IMPLANT
KIT TURNOVER KIT B (KITS) ×2 IMPLANT
MANIFOLD NEPTUNE II (INSTRUMENTS) ×2 IMPLANT
NS IRRIG 1000ML POUR BTL (IV SOLUTION) ×2 IMPLANT
PACK ORTHO EXTREMITY (CUSTOM PROCEDURE TRAY) ×2 IMPLANT
PAD ARMBOARD 7.5X6 YLW CONV (MISCELLANEOUS) ×2 IMPLANT
PREVENA RESTOR ARTHOFORM 46X30 (CANNISTER) ×2 IMPLANT
PREVENA RESTOR AXIOFORM 29X28 (GAUZE/BANDAGES/DRESSINGS) ×1 IMPLANT
STAPLER VISISTAT 35W (STAPLE) ×1 IMPLANT
STOCKINETTE IMPERVIOUS LG (DRAPES) ×2 IMPLANT
SUT ETHILON 2 0 PSLX (SUTURE) ×2 IMPLANT
SUT SILK 2 0 (SUTURE) ×2
SUT SILK 2-0 18XBRD TIE 12 (SUTURE) ×1 IMPLANT
SUT VIC AB 1 CTX 27 (SUTURE) ×5 IMPLANT
TOWEL GREEN STERILE (TOWEL DISPOSABLE) ×2 IMPLANT
TUBE CONNECTING 12X1/4 (SUCTIONS) ×2 IMPLANT
YANKAUER SUCT BULB TIP NO VENT (SUCTIONS) ×2 IMPLANT

## 2020-09-18 NOTE — Progress Notes (Signed)
OT Cancellation Note  Patient Details Name: Peter Mendez. MRN: [id number] DOB: [birth year]   Cancelled Treatment:    Reason Eval/Treat Not Completed: Patient at procedure or test/ unavailable;Other (comment) Pt out of room for surgery ( R BKA), will check back as time allows for OT session.   Lenor Derrick., COTA/L Acute Rehabilitation Services [phone]   Barron Schmid 09/18/2020, 8:39 AM

## 2020-09-18 NOTE — Anesthesia Preprocedure Evaluation (Addendum)
Anesthesia Evaluation  Patient identified by MRN, date of birth, ID band Patient awake    Reviewed: Allergy & Precautions, NPO status , Patient's Chart, lab work & pertinent test results  History of Anesthesia Complications Negative for: history of anesthetic complications  Airway Mallampati: II  TM Distance: >3 FB Neck ROM: Full    Dental  (+) Edentulous Upper, Edentulous Lower   Pulmonary Current Smoker and Patient abstained from smoking.,    Pulmonary exam normal        Cardiovascular hypertension, Pt. on medications + Peripheral Vascular Disease  Normal cardiovascular exam+ dysrhythmias Atrial Fibrillation      Neuro/Psych PSYCHIATRIC DISORDERS Anxiety negative neurological ROS     GI/Hepatic negative GI ROS, Neg liver ROS,   Endo/Other  diabetes, Type 1, Insulin Dependent  Renal/GU negative Renal ROS  negative genitourinary   Musculoskeletal negative musculoskeletal ROS (+)   Abdominal   Peds  Hematology  (+) anemia ,   Anesthesia Other Findings  S/p aortobifem on 09/11/20  Reproductive/Obstetrics                             Anesthesia Physical Anesthesia Plan  ASA: III  Anesthesia Plan: Regional and MAC   Post-op Pain Management:    Induction: Intravenous  PONV Risk Score and Plan: 1 and Propofol infusion, TIVA and Treatment may vary due to age or medical condition  Airway Management Planned: Natural Airway, Nasal Cannula and Simple Face Mask  Additional Equipment: None  Intra-op Plan:   Post-operative Plan:   Informed Consent: I have reviewed the patients History and Physical, chart, labs and discussed the procedure including the risks, benefits and alternatives for the proposed anesthesia with the patient or authorized representative who has indicated his/her understanding and acceptance.       Plan Discussed with:   Anesthesia Plan Comments:          Anesthesia Quick Evaluation

## 2020-09-18 NOTE — Progress Notes (Signed)
Mobility Specialist - Progress Note   09/18/20 1657  Mobility  Activity Stood at bedside  Level of Assistance Moderate assist, patient does 50-74%  Assistive Device Front wheel walker  Mobility Response Tolerated well  Mobility performed by Mobility specialist  $Mobility charge 1 Mobility   Pre-mobility: 61 HR, 132/73 BP, 97% SpO2 Post-mobility: 68 HR, 147/63 BP, 100% SpO2  Pt min assist to sit up on edge of bed. Mod assist to rock-to-stand. He stood twice at the edge of bed and required mod assist for steadiness. Pt back in bed after mobility.   Mamie Levers Mobility Specialist Mobility Specialist Phone: [phone]

## 2020-09-18 NOTE — Transfer of Care (Signed)
Immediate Anesthesia Transfer of Care Note  Patient: Peter Mendez.  Procedure(s) Performed: RIGHT BELOW KNEE AMPUTATION (Right Knee)  Patient Location: PACU  Anesthesia Type:MAC and MAC combined with regional for post-op pain  Level of Consciousness: drowsy and patient cooperative  Airway & Oxygen Therapy: Patient Spontanous Breathing  Post-op Assessment: Report given to RN, Post -op Vital signs reviewed and stable and Patient moving all extremities X 4  Post vital signs: Reviewed and stable  Last Vitals:  Vitals Value Taken Time  BP 120/60 09/18/20 0907  Temp    Pulse 65 09/18/20 0908  Resp 17 09/18/20 0908  SpO2 100 % 09/18/20 0908  Vitals shown include unvalidated device data.  Last Pain:  Vitals:   09/18/20 0820  TempSrc:   PainSc: 4       Patients Stated Pain Goal: 0 (63/33/54 5625)  Complications: No complications documented.

## 2020-09-18 NOTE — Anesthesia Procedure Notes (Signed)
Anesthesia Regional Block: Adductor canal block   Pre-Anesthetic Checklist: ,, timeout performed, Correct Patient, Correct Site, Correct Laterality, Correct Procedure, Correct Position, site marked, Risks and benefits discussed,  Surgical consent,  Pre-op evaluation,  At surgeon's request and post-op pain management  Laterality: Right  Prep: chloraprep       Needles:  Injection technique: Single-shot  Needle Type: Echogenic Stimulator Needle     Needle Length: 10cm  Needle Gauge: 20     Additional Needles:   Procedures:,,,, ultrasound used (permanent image in chart),,,,  Narrative:  Start time: 09/18/2020 8:12 AM End time: 09/18/2020 8:15 AM Injection made incrementally with aspirations every 5 mL.  Performed by: Personally  Anesthesiologist: [name], MD  Additional Notes: Standard monitors applied. Skin prepped. Good needle visualization with ultrasound. Injection made in 5cc increments with no resistance to injection. Patient tolerated the procedure well.

## 2020-09-18 NOTE — Anesthesia Postprocedure Evaluation (Signed)
Anesthesia Post Note  Patient: Peter Mendez.  Procedure(s) Performed: RIGHT BELOW KNEE AMPUTATION (Right Knee)     Patient location during evaluation: PACU Anesthesia Type: Regional Level of consciousness: awake and alert Pain management: pain level controlled Vital Signs Assessment: post-procedure vital signs reviewed and stable Respiratory status: spontaneous breathing, nonlabored ventilation and respiratory function stable Cardiovascular status: blood pressure returned to baseline and stable Postop Assessment: no apparent nausea or vomiting Anesthetic complications: no   No complications documented.  Last Vitals:  Vitals:   09/18/20 0930 09/18/20 0935  BP: 128/60 132/64  Pulse: 70 70  Resp: 16 16  Temp: (!) 36.3 C   SpO2: 100% 100%    Last Pain:  Vitals:   09/18/20 0930  TempSrc:   PainSc: 0-No pain                 [name]

## 2020-09-18 NOTE — Anesthesia Procedure Notes (Signed)
Anesthesia Regional Block: Popliteal block   Pre-Anesthetic Checklist: ,, timeout performed, Correct Patient, Correct Site, Correct Laterality, Correct Procedure, Correct Position, site marked, Risks and benefits discussed,  Surgical consent,  Pre-op evaluation,  At surgeon's request and post-op pain management  Laterality: Right  Prep: chloraprep       Needles:  Injection technique: Single-shot  Needle Type: Echogenic Stimulator Needle     Needle Length: 10cm  Needle Gauge: 20     Additional Needles:   Procedures:,,,, ultrasound used (permanent image in chart),,,,  Narrative:  Start time: 09/18/2020 8:15 AM End time: 09/18/2020 8:18 AM  Performed by: Personally  Anesthesiologist: [name], MD  Additional Notes: Standard monitors applied. Skin prepped. Good needle visualization with ultrasound. Injection made in 5cc increments with no resistance to injection. Patient tolerated the procedure well.

## 2020-09-18 NOTE — Progress Notes (Signed)
  Progress Note    09/18/2020 8:20 AM Day of Surgery  Subjective:  More alert this morning  Vitals:   09/18/20 0423 09/18/20 0759  BP: (!) 147/70 (!) 141/71  Pulse: 63 70  Resp: 18   Temp: 97.9 F (36.6 C)   SpO2: 100%     Physical Exam: aaox3 Abdominal and groin incisions healing well  CBC    Component Value Date/Time   WBC 12.3 (H) 09/18/2020 0021   RBC 3.53 (L) 09/18/2020 0021   HGB 10.3 (L) 09/18/2020 0021   HCT 30.5 (L) 09/18/2020 0021   PLT 84 (L) 09/18/2020 0021   MCV 86.4 09/18/2020 0021   MCH 29.2 09/18/2020 0021   MCHC 33.8 09/18/2020 0021   RDW 14.7 09/18/2020 0021   LYMPHSABS 1.4 09/09/2020 0550   MONOABS 0.7 09/09/2020 0550   EOSABS 0.2 09/09/2020 0550   BASOSABS 0.0 09/09/2020 0550    BMET    Component Value Date/Time   NA 132 (L) 09/18/2020 0021   K 4.1 09/18/2020 0021   CL 101 09/18/2020 0021   CO2 23 09/18/2020 0021   GLUCOSE 224 (H) 09/18/2020 0021   BUN 10 09/18/2020 0021   CREATININE 0.71 09/18/2020 0021   CALCIUM 7.8 (L) 09/18/2020 0021   GFRNONAA >60 09/18/2020 0021    INR    Component Value Date/Time   INR 1.2 09/11/2020 1425     Intake/Output Summary (Last 24 hours) at 09/18/2020 0820 Last data filed at 09/18/2020 0444 Gross per 24 hour  Intake 1245.09 ml  Output 1200 ml  Net 45.09 ml     Assessment/plan:  60 y.o. male is s/p AoBF with R CF - BK pop bypass with vein. Plan for R BKA with Dr. Lajoyce Corners.    Domonique Cothran C. Randie Heinz, MD [facility] Office: [phone] Pager: [phone]  09/18/2020 8:20 AM

## 2020-09-18 NOTE — Interval H&P Note (Signed)
History and Physical Interval Note:  09/18/2020 6:36 AM  Peter Mendez.  has presented today for surgery, with the diagnosis of Right Foot Gangrene.  The various methods of treatment have been discussed with the patient and family. After consideration of risks, benefits and other options for treatment, the patient has consented to  Procedure(s): RIGHT BELOW KNEE AMPUTATION (Right) as a surgical intervention.  The patient's history has been reviewed, patient examined, no change in status, stable for surgery.  I have reviewed the patient's chart and labs.  Questions were answered to the patient's satisfaction.     Nadara Mustard

## 2020-09-18 NOTE — Anesthesia Procedure Notes (Signed)
Procedure Name: MAC Date/Time: 09/18/2020 8:43 AM Performed by: Darletta Moll, CRNA Pre-anesthesia Checklist: Patient identified, Emergency Drugs available, Suction available and Patient being monitored Patient Re-evaluated:Patient Re-evaluated prior to induction Oxygen Delivery Method: Simple face mask

## 2020-09-18 NOTE — Progress Notes (Addendum)
Subjective:  Patient seen following BKA. Patient reports doing well. Denies chest pain. Enjoying his breakfast. Reports he is in good spirits and happy to have undergone the procedure. Reports some swelling of left hand, IV previously in place on Left; denies pain.  Objective:  Vital signs in last 24 hours: Vitals:   09/18/20 0920 09/18/20 0930 09/18/20 0935 09/18/20 0954  BP: 131/61 128/60 132/64 (!) 141/71  Pulse: 61 70 70 72  Resp: 18 16 16 16   Temp:  (!) 97.4 F (36.3 C)  98.2 F (36.8 C)  TempSrc:    Oral  SpO2: 98% 100% 100% 97%  Weight:      Height:       Weight change: 0.4 kg  Intake/Output Summary (Last 24 hours) at 09/18/2020 1207 Last data filed at 09/18/2020 1106 Gross per 24 hour  Intake 1745.09 ml  Output 2075 ml  Net -329.91 ml     Physical Exam Constitutional:      General: [name] not in acute distress.    Appearance: [name] underweight. [name] not toxic-appearing or diaphoretic.     Comments: Pleasant patient seen lying comfortably in bed, eating. NAD.  Cardiovascular:     Rate and Rhythm: Normal rate and regular rhythm.  Pulmonary:     Effort: Pulmonary effort is normal. No respiratory distress.  Musculoskeletal:     Comments: S/p BKA. Wound Vac in place.  Mild swelling to left hand, non-tender  Neurological:     Mental Status: [name] alert. Mental status is at baseline.     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 New onset a fib, a flutter 2/7; transitioned to sinus bradycardia 2/10, rates have been improving with amiodarone discontinued.S/p Transtibial amputation on 2/11.    Arterial Disease with Infrarenal Aortic Occlusion, Bilateral Common and Femoral Occlusions  Dry Gangrene with Moderate Cellulitis RLE; s/p Transtibial amputation Superficial Femoral Artery Occlusions -Bilateral  S/paorta bifemoral and right fem-pop bypasson 2/04.No systemic signs or symptomsof infection. 5 days on Vancomycin; 6 days on ceftriaxone. Blood cultures without growth to date.  -Orthopedics and Vascular following -s/prevascularizationon 2/04(aortobifemoral bypass and right fem pop bypass) -S/p BKAtoday      -Cefazolin for 24 hrs      -Nonweightbearing on Right      -Continue wound vac for 1 week      -f/u in the office 1 week post operative.  -Spiritual Care consulted -PT/OT following -recommending CIR.  -Cardiology following -ContinueCrestor 20mg  -ContinueAspirin 81 -morphine 1mg  q1prn,percocet q6prn,scheduled Tylenol BID.    Heparin Induced Thrombocytopenia Patient with new onset drop in platelets to 111 on  2/10; from 201 the day prior that. Further drop in platelets to 84 today. Notably has been on heparin DVT prophylaxis since admission; started on heparin drip few days ago for post-op afib. Intermediate risk per 4T score. Heparin stopped 2/10. 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   -Continue without Heparin  -Start Fondaparinux  7.5 -Monitor for resolution  -f/u blood smear report    Post-op A-fib / A-flutter ; Resolved Sinus Bradycardia Patient converted to sinus bradycardia two days ago. Heart rate normalizing off the amiodarone.   *Cardiology following, appreciate recs: -Switched to Toprol at reduced dose of 50 mg qd  -Okay to stop anticoagulation as afib resolved    Latent Autoimmune Diabetes -f/u CBG -Resumed Lantus at 5 BID -Novolog to 0-9   Chronic Hyponatremia, mild Chronic. Possibly 2/2 tea and toast diet; and mild SIADH.Mild at 132today -continued monitoring   Tobacco Use -nicotine patch   HTN -Holdingramipril10mg  -HoldingNorvasc 10mg     LOS: 10 days   , Medical Student 09/18/2020, 12:07 PM

## 2020-09-18 NOTE — Op Note (Signed)
   Date of Surgery: 09/18/2020  INDICATIONS: Peter Mendez is a 60 y.o.-year-old male who has severe peripheral vascular disease he is status post revascularization to both lower extremities.  Despite revascularization patient has progressive gangrenous changes of the foot and ankle on the right.  The ischemic blisters on the leg are healing nicely and patient presents at this time for transtibial amputation..  Of note patient's revascularization medial to the knee is showing wound dehiscence with clear drainage no cellulitis no signs of infection.  PREOPERATIVE DIAGNOSIS: Gangrene right foot and ankle  POSTOPERATIVE DIAGNOSIS: Same.  PROCEDURE: Transtibial amputation Application of Prevena wound VAC  SURGEON: Lajoyce Corners, M.D.  ANESTHESIA:  general  IV FLUIDS AND URINE: See anesthesia.  ESTIMATED BLOOD LOSS: 100 mL.  COMPLICATIONS: None.  DESCRIPTION OF PROCEDURE: The patient was brought to the operating room and underwent a regional anesthetic. After adequate levels of anesthesia were obtained patient's lower extremity was prepped using DuraPrep draped into a sterile field. A timeout was called. The foot was draped out of the sterile field with impervious stockinette. A transverse incision was made 11 cm distal to the tibial tubercle. This curved proximally and a large posterior flap was created. The tibia was transected 1 cm proximal to the skin incision. The fibula was transected just proximal to the tibial incision. The tibia was beveled anteriorly. A large posterior flap was created. The sciatic nerve was pulled cut and allowed to retract. The vascular bundles were suture ligated with 2-0 silk. The deep and superficial fascial layers were closed using #1 Vicryl. The skin was closed using staples and 2-0 nylon. The wound was covered with a Prevena wound VAC. There was a good suction fit. A prosthetic shrinker will be applied. Patient was taken to the PACU in stable condition.  The  revascularization incision medial knee was incorporated into the wound VAC dressing.   DISCHARGE PLANNING:  Antibiotic duration: Continue antibiotics for 24 hours  Weightbearing: Nonweightbearing on the right  Pain medication: Opioid pathway  Dressing care/ Wound VAC: Continue wound VAC for 1 week  Discharge to: Anticipate patient will need discharge to skilled nursing.  Follow-up: In the office 1 week post operative.  Peter Baker, MD [facility] 9:25 AM

## 2020-09-18 NOTE — Progress Notes (Signed)
Progress Note  Patient Name: Peter Mendez. Date of Encounter: 09/18/2020  [facility] Cardiologist: No primary care provider on file. Jacquese Hackman  Subjective   Postop BKA Dr. Lajoyce Corners, comfortable.  Much clearer mentally today.  Significant other at bedside.  Inpatient Medications    Scheduled Meds: . sodium chloride   Intravenous Once  . acetaminophen  650 mg Oral BID  . aspirin EC  81 mg Oral Daily  . chlorhexidine  15 mL Mouth Rinse BID  . Chlorhexidine Gluconate Cloth  6 each Topical Daily  . gabapentin  300 mg Oral TID  . insulin aspart  0-9 Units Subcutaneous TID WC  . mouth rinse  15 mL Mouth Rinse q12n4p  . metoprolol tartrate  50 mg Oral BID  . nicotine  21 mg Transdermal Daily  . pantoprazole  40 mg Oral Daily  . potassium chloride  40 mEq Oral BID  . rosuvastatin  20 mg Oral Daily  . senna-docusate  1 tablet Oral Daily  . sodium chloride flush  3 mL Intravenous Q12H   Continuous Infusions: . sodium chloride    . sodium chloride 50 mL/hr at 09/17/20 1734  . sodium chloride    . sodium chloride 75 mL/hr at 09/18/20 1010  . amiodarone Stopped (09/16/20 2151)  .  ceFAZolin (ANCEF) IV    . magnesium sulfate bolus IVPB     PRN Meds: sodium chloride, sodium chloride, acetaminophen **OR** acetaminophen, ALPRAZolam, labetalol, magnesium sulfate bolus IVPB, metoprolol tartrate, morphine injection, ondansetron, oxyCODONE-acetaminophen, phenol, sodium chloride flush   Vital Signs    Vitals:   09/18/20 0920 09/18/20 0930 09/18/20 0935 09/18/20 0954  BP: 131/61 128/60 132/64 (!) 141/71  Pulse: 61 70 70 72  Resp: 18 16 16 16   Temp:  (!) 97.4 F (36.3 C)  98.2 F (36.8 C)  TempSrc:    Oral  SpO2: 98% 100% 100% 97%  Weight:      Height:        Intake/Output Summary (Last 24 hours) at 09/18/2020 1104 Last data filed at 09/18/2020 0852 Gross per 24 hour  Intake 1745.09 ml  Output 1300 ml  Net 445.09 ml   Last 3 Weights 09/18/2020 09/17/2020 09/16/2020  Weight (lbs)  140 lb 3.4 oz 139 lb 5.3 oz 161 lb 9.6 oz  Weight (kg) 63.6 kg 63.2 kg 73.3 kg      Telemetry    Sinus rhythm in the 60s- Personally Reviewed  ECG    No new- Personally Reviewed  Physical Exam   GEN: No acute distress.   Neck: No JVD Cardiac: RRR, no murmurs, rubs, or gallops.  Respiratory: Clear to auscultation bilaterally. GI: Soft, nontender, non-distended  MS: No edema; status post BKA Neuro:  Nonfocal  Psych: Normal affect   Labs    High Sensitivity Troponin:  No results for input(s): TROPONINIHS in the last 720 hours.    Chemistry Recent Labs  Lab 09/12/20 0500 09/13/20 0228 09/14/20 0343 09/17/20 0319 09/17/20 1640 09/18/20 0021  NA 133* 138   < > 133* 134* 132*  K 3.5 3.3*   < > 3.3* 4.0 4.1  CL 97* 102   < > 100 102 101  CO2 24 26   < > 25 24 23   GLUCOSE 262* 113*   < > 89 86 224*  BUN 9 13   < > 17 13 10   CREATININE 0.62 0.61   < > 0.83 0.63 0.71  CALCIUM 8.4* 8.2*   < >  8.1* 8.0* 7.8*  PROT 5.6* 5.5*  --   --   --   --   ALBUMIN 2.8* 2.5*  --   --   --   --   AST 27 53*  --   --   --   --   ALT 13 12  --   --   --   --   ALKPHOS 72 65  --   --   --   --   BILITOT 0.8 0.6  --   --   --   --   GFRNONAA >60 >60   < > >60 >60 >60  ANIONGAP 12 10   < > 8 8 8    < > = values in this interval not displayed.     Hematology Recent Labs  Lab 09/17/20 0319 09/17/20 1640 09/18/20 0021  WBC 10.0 12.1* 12.3*  RBC 3.38* 3.47* 3.53*  HGB 9.6* 10.5* 10.3*  HCT 28.7* 28.9* 30.5*  MCV 84.9 83.3 86.4  MCH 28.4 30.3 29.2  MCHC 33.4 36.3* 33.8  RDW 14.1 14.5 14.7  PLT 111* 93* 84*     Patient Profile     60 y.o. male postop aortobifemoral bypass and right common femoral to below-knee popliteal bypass with postoperative atrial fibrillation/flutter, gangrene, post BKA  Dr. 77  Assessment & Plan    Postoperative paroxysmal atrial flutter -Utilized IV amiodarone initially. -Now sinus rhythm/sinus bradycardia.  Off amiodarone.  Excellent. -I will  consolidate to Toprol 50 mg once a day, slightly lower dose given his prior bradycardia. -Given the newer evidence of thrombocytopenia or decreasing platelets, I am comfortable stopping anticoagulation especially IV heparin products at this time.  His atrial flutter was secondary to postoperative state.  Obviously if atrial flutter or fibrillation were to return, we would need to consider long-term Eliquis.  Discussed with primary team.    For questions or updates, please contact [facility] Please consult [email or web address] for contact info under        Signed, Lajoyce Corners, MD  09/18/2020, 11:04 AM

## 2020-09-18 NOTE — Progress Notes (Signed)
Orthopedic Tech Progress Note Patient Details:  Peter Mendez [birth year] [id number] Called in order to [facility] for a VIVE PROTOCOL BK Patient ID: [name]., male   DOB: 1960/11/20, 60 y.o.   MRN: [id number]   Donald Pore 09/18/2020, 9:56 AM

## 2020-09-19 ENCOUNTER — Encounter (HOSPITAL_COMMUNITY): Payer: Self-pay | Admitting: Orthopedic Surgery

## 2020-09-19 DIAGNOSIS — I4891 Unspecified atrial fibrillation: Secondary | ICD-10-CM

## 2020-09-19 LAB — BASIC METABOLIC PANEL
Anion gap: 9 (ref 5–15)
BUN: 12 mg/dL (ref 8–23)
CO2: 25 mmol/L (ref 22–32)
Calcium: 7.8 mg/dL — ABNORMAL LOW (ref 8.9–10.3)
Chloride: 95 mmol/L — ABNORMAL LOW (ref 98–111)
Creatinine, Ser: 0.76 mg/dL (ref 0.61–1.24)
GFR, Estimated: >60 mL/min (ref >60)
Glucose, Bld: 318 mg/dL — ABNORMAL HIGH (ref 70–99)
Potassium: 5 mmol/L (ref 3.5–5.1)
Sodium: 129 mmol/L — ABNORMAL LOW (ref 135–145)

## 2020-09-19 LAB — CBC
HCT: 27.7 % — ABNORMAL LOW (ref 39.0–52.0)
Hemoglobin: 9.4 g/dL — ABNORMAL LOW (ref 13.0–17.0)
MCH: 29 pg (ref 26.0–34.0)
MCHC: 33.9 g/dL (ref 30.0–36.0)
MCV: 85.5 fL (ref 80.0–100.0)
Platelets: 78 10*3/uL — ABNORMAL LOW (ref 150–400)
RBC: 3.24 MIL/uL — ABNORMAL LOW (ref 4.22–5.81)
RDW: 15.1 % (ref 11.5–15.5)
WBC: 14.6 10*3/uL — ABNORMAL HIGH (ref 4.0–10.5)
nRBC: 0 % (ref 0.0–0.2)

## 2020-09-19 LAB — MAGNESIUM: Magnesium: 2 mg/dL (ref 1.7–2.4)

## 2020-09-19 LAB — GLUCOSE, CAPILLARY
Glucose-Capillary: 263 mg/dL — ABNORMAL HIGH (ref 70–99)
Glucose-Capillary: 222 mg/dL — ABNORMAL HIGH (ref 70–99)
Glucose-Capillary: 267 mg/dL — ABNORMAL HIGH (ref 70–99)
Glucose-Capillary: 314 mg/dL — ABNORMAL HIGH (ref 70–99)
Glucose-Capillary: 289 mg/dL — ABNORMAL HIGH (ref 70–99)

## 2020-09-19 LAB — APTT: aPTT: 39 seconds — ABNORMAL HIGH (ref 24–36)

## 2020-09-19 MED ORDER — INSULIN GLARGINE 100 UNIT/ML ~~LOC~~ SOLN
10.0000 [IU] | Freq: Two times a day (BID) | SUBCUTANEOUS | Status: DC
  Administered 2020-09-19 (×2): 10 [IU] via SUBCUTANEOUS
  Filled 2020-09-19 (×3): qty 0.1

## 2020-09-19 MED ORDER — OXYCODONE-ACETAMINOPHEN 5-325 MG PO TABS
1.0000 | ORAL_TABLET | ORAL | Status: DC | PRN
Start: 2020-09-19 — End: 2020-09-25
  Administered 2020-09-19 – 2020-09-21 (×8): 2 via ORAL
  Administered 2020-09-21: 1 via ORAL
  Administered 2020-09-21 – 2020-09-25 (×15): 2 via ORAL
  Filled 2020-09-19 (×25): qty 2

## 2020-09-19 MED ORDER — SODIUM CHLORIDE 0.9% FLUSH: 3.0000 mL | Freq: Two times a day (BID) | INTRAVENOUS | Status: DC

## 2020-09-19 MED ORDER — SODIUM CHLORIDE 0.9% FLUSH: 3.0000 mL | INTRAVENOUS | Status: DC | PRN

## 2020-09-19 MED ORDER — MORPHINE SULFATE (PF) 2 MG/ML IV SOLN
2.0000 mg | INTRAVENOUS | Status: DC | PRN
  Administered 2020-09-19 – 2020-09-20 (×3): 2 mg via INTRAVENOUS
  Filled 2020-09-19 (×3): qty 1

## 2020-09-19 MED ORDER — SODIUM CHLORIDE 0.9 % IV SOLN: 250.0000 mL | INTRAVENOUS | Status: DC | PRN

## 2020-09-19 NOTE — Hospital Course (Addendum)
Peter Mendez. [name] is 60yo male with PMH of latent autoimmune diabetes, neuropathy, tobacco use, near blindness, LE ischemic ulcerations, and hypertension presenting with extensive arterial disease and gangrene and cellulitis of the RLE. CT Angio showed occlusion of infrarenal abdominal aorta and bilateral common and external iliacs; significant stenosis at junction of inferior epigastrics and common femorals, distal right SFA occlusion, and heavily occluded left SFA. Patient s/p Aortobifemoral and right fem-pop bypass on 2/4, and Right BKA on 2/11. Developed transient post-op atrial fibrillation/atrial flutter.

## 2020-09-19 NOTE — Progress Notes (Signed)
Patient ID: Peter Newness., male   DOB: February 08, 1961, 60 y.o.   MRN: [id number] Patient is postoperative day one right transtibial amputation.  There is no drainage in the wound VAC canister patient does complain of pain.  Peter Mendez is eating well.  There was some early wound dehiscence of the revascularization incision at the medial aspect of the knee.  This was covered with the wound VAC dressing.  Discharge planning based on recommendations of therapy.

## 2020-09-19 NOTE — Evaluation (Signed)
Physical Therapy Re-Evaluation Patient Details Name: Peter Mendez. MRN: [id number] DOB: 25-Oct-1960 Today's Date: 09/19/2020   History of Present Illness  60 yo s/p aortobifem bypass graft, R fem pop bypass due to critical limb ischemia. 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 PMH PVD, RLE dry gangrene, neuropathy, autoimmune diabetes, tobacco use, HTN, vision impairment.  Clinical Impression   Pt seen POD1 s/p R BKA. Pt complaining of R residual limb pain and is anxious for mobility as yesterday he had a difficult time mobilizing. Pt requiring mod assist for bed mobility, sit to stand, and pivot out of bed to recliner. Pt demonstrating x2 hops with good balance, although pt was uncomfortable. PT encouraged pt to begin BKA exercises while up in chair, performed well. PT continuing to recommend CIR post-acutely.      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    Transfers Overall transfer level: Needs assistance Equipment used: Rolling walker (2 wheeled) Transfers: Sit to/from [employer] Sit to Stand: Mod assist;From elevated surface Stand pivot transfers: Mod assist;From elevated surface       General transfer comment: Mod assist for stand and pivot to recliner for power up, steadying,  and guiding RW. Increased time and effort, pt stood x1 minute for PT clean up pt after BM in bed.  Ambulation/Gait             General Gait Details: nt  Information systems manager [place] (Stroke Patients Only)       Balance Overall balance assessment: Needs assistance Sitting-balance support: Feet supported;Single extremity supported Sitting balance-Leahy Scale: Fair     Standing balance support: Bilateral upper extremity supported Standing balance-Leahy Scale: Poor Standing balance comment: reliant on RW and external assist                             Pertinent Vitals/Pain Pain Assessment: Faces Faces Pain Scale: Hurts little more Pain Location: R residual limb, buttocks Pain Descriptors / Indicators: Grimacing;Guarding;Discomfort Pain Intervention(s): Monitored during session;Limited activity within patient's tolerance;Repositioned    Home Living Family/patient expects to be discharged to:: Private residence Living Arrangements: Parent Available Help at Discharge: Family;Available 24 hours/day Type of Home: House Home Access: Ramped entrance     Home Layout: One level Home Equipment: Cane - single point Additional Comments: pt endorsing only using SPC at times with pain to R LE.    Prior Function Level of Independence: Independent with assistive device(s)         Comments: endorsing mother and girlfriend cook/drive and assist as needed with medicaiton     Hand Dominance   Dominant Hand: Right    Extremity/Trunk Assessment   Upper Extremity Assessment Upper Extremity Assessment: Defer to OT  evaluation    Lower Extremity Assessment Lower Extremity Assessment: Generalized weakness;RLE deficits/detail RLE Deficits / Details: s/p BKA; able to perform SLR with lift assist, hip abd/add, quad set    Cervical / Trunk Assessment Cervical / Trunk Assessment: Kyphotic  Communication   Communication: No  difficulties  Cognition Arousal/Alertness: Awake/alert Behavior During Therapy: Anxious Overall Cognitive Status: Impaired/Different from baseline Area of Impairment: Attention;Memory;Following commands;Safety/judgement;Problem solving                   Current Attention Level: Selective;Sustained Memory: Decreased recall of precautions;Decreased short-term memory Following Commands: Follows one step commands with increased time Safety/Judgement: Decreased awareness of safety   Problem Solving: Decreased initiation;Difficulty sequencing;Requires tactile cues;Requires verbal cues General Comments: Pt is tangential in conversation, gets sidetracked during mobility frequently. Cues for safety during mobility, step-by-step cuing required. Pt soiled in stool upon arrival to room and pt did not know.      General Comments      Exercises Amputee Exercises Hip ABduction/ADduction: AAROM;Right;Seated (in recliner; x3 reps with lift assist) Knee Extension: AROM;Right;5 reps;Seated   Assessment/Plan    PT Assessment Patient needs continued PT services  PT Problem List Decreased activity tolerance;Decreased balance;Decreased mobility;Decreased knowledge of use of DME;Decreased safety awareness;Decreased knowledge of precautions;Cardiopulmonary status limiting activity;Pain;Decreased strength       PT Treatment Interventions Gait training;DME instruction;Functional mobility training;Therapeutic activities;Therapeutic exercise;Balance training;Patient/family education    PT Goals (Current goals can be found in the Care Plan section)  Acute Rehab PT Goals Patient Stated Goal: to be as indep as possible PT Goal Formulation: With patient Time For Goal Achievement: 09/26/20 Potential to Achieve Goals: Good    Frequency Min 3X/week   Barriers to discharge        Co-evaluation               AM-PAC PT "6 Clicks" Mobility  Outcome Measure Help needed turning from your back to  your side while in a flat bed without using bedrails?: A Little Help needed moving from lying on your back to sitting on the side of a flat bed without using bedrails?: A Little Help needed moving to and from a bed to a chair (including a wheelchair)?: A Lot Help needed standing up from a chair using your arms (e.g., wheelchair or bedside chair)?: A Lot Help needed to walk in hospital room?: A Lot Help needed climbing 3-5 steps with a railing? : Total 6 Click Score: 13    End of Session Equipment Utilized During Treatment: Other (comment) (wound vac, protective sleeve for amputation) Activity Tolerance: Patient tolerated treatment well Patient left: in bed;with bed alarm set;with call bell/phone within reach;with restraints reapplied (mitts on) Nurse Communication: Mobility status PT Visit Diagnosis: Unsteadiness on feet (R26.81);Muscle weakness (generalized) (M62.81);Pain Pain - Right/Left: Right Pain - part of body: Leg (R leg)    Time: 9449-6759 PT Time Calculation (min) (ACUTE ONLY): 24 min   Charges:   PT Evaluation $PT Re-evaluation: 1 Re-eval PT Treatments $Therapeutic Activity: 8-22 mins       Lilyanne Mcquown S, PT Acute Rehabilitation Services Pager [phone]  Office [phone]  Tysin Salada E Christain [place] 09/19/2020, 1:09 PM

## 2020-09-19 NOTE — Evaluation (Signed)
Occupational Therapy Evaluation Patient Details Name: Peter Mendez. MRN: [id number] DOB: 02-27-1961 Today's Date: 09/19/2020    History of Present Illness 60 yo s/p aortobifem bypass graft, R fem pop bypass due to critical limb ischemia. 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 PMH PVD, RLE dry gangrene, neuropathy, autoimmune diabetes, tobacco use, HTN, vision impairment.   Clinical Impression   Patient with original admit on 2/1 for the diagnosis above.  Now he is s/p R BKA performed on 2/11.  [name] experiencing pain to the residual limb, and continued waxing and waning cognition, which has impacted his functional status even more compared to his original OT evaluation.  [name] needing up to Mod A for basic mobility, and up to near Max A for lower body ADL seated edge of bed.  He had a difficult time sitting edge of bed for any length of time, and requested to lie back down.  OT will continue to see him in the acute setting to advance his independence for CIR consideration.  He will need aggressive post acute rehab in order to transition back home to his mother's home at a max functional level.      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    Transfers Overall transfer level: Needs assistance Equipment used: Rolling walker (2 wheeled) Transfers: Sit to/from [employer] Sit to Stand: Mod assist;From elevated surface Stand pivot transfers: Mod assist;From elevated surface       General transfer comment: declined OOB to recliner due to R leg pain.    Balance Overall balance assessment: Needs assistance Sitting-balance support: Feet supported;Bilateral upper extremity supported Sitting balance-Leahy Scale: Fair     Standing balance support: Bilateral upper extremity supported Standing balance-Leahy Scale: Poor Standing balance comment: reliant on RW and external assist                           ADL either performed or assessed with clinical judgement   ADL Overall ADL's : Needs assistance/impaired     Grooming: Wash/dry face;Set up;Bed level;Wash/dry hands           Upper Body Dressing : Minimal assistance;Sitting   Lower Body Dressing: Sitting/lateral leans;Moderate assistance                 General ADL Comments: Patient returned to bed, declined back to the recliner.     Vision Baseline Vision/History: Legally blind Patient Visual Report: No change from baseline       Perception     Praxis      Pertinent Vitals/Pain Pain Assessment: Faces Faces Pain Scale: Hurts whole lot Pain Location: R residual limb, buttocks Pain Descriptors / Indicators: Grimacing;Guarding;Discomfort Pain Intervention(s): Monitored during session;Limited activity within patient's tolerance  Hand Dominance Right   Extremity/Trunk Assessment Upper Extremity Assessment Upper Extremity Assessment: Overall WFL for tasks assessed   Lower Extremity Assessment Lower Extremity Assessment: Defer to PT evaluation RLE Deficits / Details: s/p BKA; able to perform SLR with lift assist, hip abd/add, quad set   Cervical / Trunk Assessment Cervical / Trunk Assessment: Kyphotic    Communication Communication Communication: No difficulties   Cognition Arousal/Alertness: Awake/alert Behavior During Therapy: Restless;Anxious Overall Cognitive Status: Impaired/Different from baseline Area of Impairment: Attention                 Orientation Level: Person Current Attention Level: Selective Memory: Decreased recall of precautions;Decreased short-term memory Following Commands: Follows one step commands with increased time Safety/Judgement: Decreased awareness of safety   Problem Solving: Slow processing;Requires verbal cues;Requires tactile cues General Comments: Pt is tangential in conversation, gets sidetracked during mobility frequently. Cues for safety during mobility, step-by-step cuing required. Pt soiled in stool upon arrival to room and pt did not know.   General Comments                Home Living Family/patient expects to be discharged to:: Inpatient rehab Living Arrangements: Parent Available Help at Discharge: Family;Available 24 hours/day Type of Home: House Home Access: Ramped entrance     Home Layout: One level     Bathroom Shower/Tub: Chief Strategy Officer: Standard Bathroom Accessibility: No   Home Equipment: Cane - single point   Additional Comments: pt endorsing only using SPC at times with pain to R LE.      Prior Functioning/Environment Level of Independence: Independent with assistive device(s)        Comments: per chart: mother and girlfriend cook/drive and assist as needed with medicaiton        OT Problem List: Decreased strength;Decreased activity tolerance;Decreased range of motion;Impaired balance (sitting and/or standing);Decreased cognition;Decreased safety awareness;Decreased knowledge of use of DME or AE;Decreased knowledge of precautions;Impaired sensation;Pain      OT Treatment/Interventions: Self-care/ADL training;Therapeutic exercise;Neuromuscular education;DME and/or AE  instruction;Energy conservation;Therapeutic activities;Cognitive remediation/compensation;Balance training;Patient/family education    OT Goals(Current goals can be found in the care plan section) Acute Rehab OT Goals Patient Stated Goal: I need to hurt less, I can't move that well OT Goal Formulation: With patient Time For Goal Achievement: 10/03/20 Potential to Achieve Goals: Fair ADL Goals Pt Will Perform Lower Body Bathing: sitting/lateral leans;with min guard assist Pt Will Perform Upper Body Dressing: sitting;with modified independence Pt Will Perform Lower Body Dressing: sitting/lateral leans;with min guard assist Pt Will Transfer to Toilet: with min guard assist;stand pivot transfer;bedside commode Pt Will Perform Toileting - Clothing Manipulation and hygiene: with min guard assist;sitting/lateral leans Pt Will Perform Tub/Shower Transfer: with min guard assist;Stand pivot transfer;tub bench  OT Frequency: Min 2X/week   Barriers to D/C:  none noted          Co-evaluation              AM-PAC OT "6 Clicks" Daily Activity     Outcome Measure Help from another person eating meals?: None Help from another person taking care of personal grooming?: A Little Help from another person toileting, which includes using toliet, bedpan, or urinal?: A Lot Help from another person bathing (including washing, rinsing, drying)?: A Lot Help from another person to put on and taking off regular upper body clothing?: A Little Help from another person to put on and taking off regular lower body clothing?: A Lot 6 Click Score: 16  End of Session    Activity Tolerance: Patient limited by pain Patient left: in bed;with call bell/phone within reach;with bed alarm set  OT Visit Diagnosis: Unsteadiness on feet (R26.81)                Time: 5859-2924 OT Time Calculation (min): 22 min Charges:  OT General Charges $OT Visit: 1 Visit OT Evaluation $OT Re-eval: 1 Re-eval  09/19/2020  Rich,  OTR/L  Acute Rehabilitation Services  Office:  [phone]   Suzanna Obey 09/19/2020, 3:33 PM

## 2020-09-19 NOTE — Progress Notes (Signed)
Pharmacy Heparin Induced Thrombocytopenia (HIT) Note:  Peter Mendez. is an 60 y.o. male being evaluated for HIT. Heparin IV was started 2/8 for atrial fibrillation (patient had been on SQ hep for DVT ppx since 2/1), and baseline platelets were 229.   HIT labs were ordered on 2/10 when platelets dropped to 111. Patient is s/p aortobifemoral bypass on 2/4 and R BKA on 2/11 and received cefazolin for surgical prophylaxis.  Auto-populate labs:  Heparin Induced Plt Ab  Date/Time Value Ref Range Status  09/17/2020 07:49 AM 2.599 (H) 0.000 - 0.400 OD Final    Comment:    (NOTE) Performed At: [facility] [address] [place], [place] [id number] Jolene Schimke MD ZG:[id number]      CALCULATE SCORE:  4Ts (see the HIT Algorithm) Score  Thrombocytopenia 1  Timing 2  Thrombosis 0  Other causes of thrombocytopenia 0  Total 3     Recommendations (A or B) are based on available lab results (HIT antibody and/or SRA) and the HIT algorithm    A. HIT antibody result available  Possible HIT    Order SRA:  Yes  Discontinue heparin / LMWH:  Yes  Initiate alternative anticoagulation:  Yes  Document heparin allergy:  Yes  B. SRA result availability  SRA not available   Plan (Discussed with provider) Labs ordered:  SRA ordered  Heparin allergy:  Heparin allergy documented or updated. Anticoagulation plans:  Begin alternative anticoagulation with fondaparinux 7.5 mg SQ every 24 hours  Comments: avoiding argatroban or bivalrudin given patient pulling out IV lines.   Kinnie Feil, PharmD PGY1 Acute Care Pharmacy Resident 09/19/2020 7:37 AM  Please check [email or web address] for unit specific pharmacy phone numbers.

## 2020-09-19 NOTE — Progress Notes (Signed)
Dr. Lajoyce Corners requesting for patient Peter Mendez wound vac machine be switched to hospital KCI machine under Peter Mendez settings. Wound vac machine ordered and placed with Peter Mendez settings on machine. Seal intact at this time. Will monitor patient. Myasia Sinatra, Randall An RN

## 2020-09-19 NOTE — Progress Notes (Addendum)
[facility]  Subjective  -Pain in the right LE   Objective (!) 116/54 (!) 52 98.2 F (36.8 C) (Oral) 18 100%  Intake/Output Summary (Last 24 hours) at 09/19/2020 0913 Last data filed at 09/19/2020 0600 Gross per 24 hour  Intake 1093.2 ml  Output 2525 ml  Net -1431.8 ml    Left LE with doppler signals DP/PT Abdomin soft with incision healing well Lungs non labored breathing Heart RRR Right LE managed by Dr. Lajoyce Corners dressing intact  Assessment/Planning: POD # 7 status post aortobifemoral bypass and a right common femoral to below-knee popliteal bypass POD # 1 BKA  Pain issues Mobility encouragement Good urine OP and Cr WNL   Peter Mendez 09/19/2020 9:13 AM --  Laboratory Lab Results: Recent Labs    09/18/20 0021 09/19/20 0235  WBC 12.3* 14.6*  HGB 10.3* 9.4*  HCT 30.5* 27.7*  PLT 84* 78*   BMET Recent Labs    09/18/20 0021 09/19/20 0235  NA 132* 129*  K 4.1 5.0  CL 101 95*  CO2 23 25  GLUCOSE 224* 318*  BUN 10 12  CREATININE 0.71 0.76  CALCIUM 7.8* 7.8*    COAG Lab Results  Component Value Date   INR 1.2 09/18/2020   INR 1.2 09/11/2020   No results found for: PTT   I have seen and evaluated the patient and agree with the above assessment and plan.  His incisions are intact although he does have some ecchymosis in the groin.  We will work with mobilization and advance his diet.  Appreciate cardiology assistance.  [name] off amiodarone and in sinus rhythm.  His platelet count continues to trend down.  [name] on fondaparinux.  We will send HIT panel.  Durene Cal

## 2020-09-19 NOTE — Progress Notes (Signed)
Dr Anne Fu notes reviewed  Nothing new to add  Cardiology to be available as needed this weekend.  Hillis Range MD, [facility] 09/19/2020 11:57 AM

## 2020-09-19 NOTE — Progress Notes (Addendum)
Subjective:  Patient reporting increased pain (rated at 9-10/10). Additionally reporting soreness on left rear. Good appetite, preparing to eat meal at bedside.   Objective:  Vital signs in last 24 hours: Vitals:   09/18/20 2000 09/19/20 0044 09/19/20 0435 09/19/20 0908  BP: (!) 126/57 (!) 151/70 (!) 121/56 (!) 116/54  Pulse: (!) 52 (!) 52 (!) 52 (!) 52  Resp: 16 17 16 18   Temp: 98.2 F (36.8 C) 98 F (36.7 C) 97.6 F (36.4 C) 98.2 F (36.8 C)  TempSrc: Oral Oral Oral Oral  SpO2: 100% 100% 99% 100%  Weight:   64.4 kg   Height:       Weight change: 0.8 kg  Intake/Output Summary (Last 24 hours) at 09/19/2020 1017 Last data filed at 09/19/2020 0600 Gross per 24 hour  Intake 1093.2 ml  Output 2525 ml  Net -1431.8 ml    Physical Exam Constitutional:      General: [name] not in acute distress.    Appearance: [name] not ill-appearing, toxic-appearing or diaphoretic.     Comments: Patient seen lying in bed. NAD. Intermittently tearful.  Cardiovascular:     Rate and Rhythm: Normal rate and regular rhythm.  Skin:    Comments: Protective bandage in place on rear. No surrounding erythema, or skin breakdown noted (though personal exam mechanically limited). None noted on earlier exam per RN.  Neurological:     Mental Status: [name] alert.  Psychiatric:        Mood and Affect: Affect is tearful.        Speech: Speech normal.      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 New onset a fib, a flutter 2/7; transitioned to sinus bradycardia 2/10, rates have been improving with amiodarone discontinued.S/p Transtibial amputation on 2/11.   Dry Gangrene with Moderate Cellulitis RLE; s/p Transtibial amputation 2/11 NGTD on blood cultures Peripheral Arterial Disease s/p bifem and right fem-pop 2/4 Superficial Femoral Artery Occlusions -Bilateral -management per orthopedics, vascular -Pain management: morphine 2mg  q1h prn, percocet q4h prn, scheduled tylenol  -Activity: Non-weightbearing to RLE, PT/OT -Wound care: continue wound vac x1w -will follow up with orthopedics 1 week post op -ContinueCrestor 20mg  -ContinueAspirin 81  Heparin Induced Thrombocytopenia. Antibody +, SRA in process. Heparin now on allergy list -Continue Fondaparinux 7.5  -daily lab monitoring  Post-opA-fib / A-flutter ; Resolved Sinus Bradycardia HR in the 50s-60s. *Cardiology following, appreciate recs: -Switched to Toprol at reduced dose of 50 mg qd  -Okay to stop anticoagulation as afib resolved  Latent Autoimmune Diabetes -Increased Lantus to 10 BID -Novolog to 0-9  Chronic Hyponatremia, mild Chronic. Possibly 2/2 tea and toast diet; and mild SIADH.Corrected sodium 132-134 today -  continued monitoring  Tobacco Use -nicotine patch  HTN -Holdingramipril10mg  -HoldingNorvasc 10mg    LOS: 11 days   , Medical Student 09/19/2020, 10:17 AM   Attestation for Student Documentation:  I personally was present and performed or re-performed the  history, physical exam and medical decision-making activities of this service and have verified that the service and findings are accurately documented in the student's note.  11/17/2020, MD Internal Medicine Resident PGY-2 [facility] Pager: [phone] 09/19/2020 3:51 PM

## 2020-09-20 DIAGNOSIS — E871 Hypo-osmolality and hyponatremia: Secondary | ICD-10-CM

## 2020-09-20 DIAGNOSIS — R001 Bradycardia, unspecified: Secondary | ICD-10-CM

## 2020-09-20 DIAGNOSIS — R739 Hyperglycemia, unspecified: Secondary | ICD-10-CM

## 2020-09-20 LAB — BASIC METABOLIC PANEL
Anion gap: 5 (ref 5–15)
BUN: 13 mg/dL (ref 8–23)
CO2: 28 mmol/L (ref 22–32)
Calcium: 7.8 mg/dL — ABNORMAL LOW (ref 8.9–10.3)
Chloride: 94 mmol/L — ABNORMAL LOW (ref 98–111)
Creatinine, Ser: 0.8 mg/dL (ref 0.61–1.24)
GFR, Estimated: >60 mL/min (ref >60)
Glucose, Bld: 322 mg/dL — ABNORMAL HIGH (ref 70–99)
Potassium: 5 mmol/L (ref 3.5–5.1)
Sodium: 127 mmol/L — ABNORMAL LOW (ref 135–145)

## 2020-09-20 LAB — GLUCOSE, CAPILLARY
Glucose-Capillary: 301 mg/dL — ABNORMAL HIGH (ref 70–99)
Glucose-Capillary: 203 mg/dL — ABNORMAL HIGH (ref 70–99)
Glucose-Capillary: 181 mg/dL — ABNORMAL HIGH (ref 70–99)
Glucose-Capillary: 214 mg/dL — ABNORMAL HIGH (ref 70–99)
Glucose-Capillary: 236 mg/dL — ABNORMAL HIGH (ref 70–99)

## 2020-09-20 LAB — CBC
HCT: 25.5 % — ABNORMAL LOW (ref 39.0–52.0)
Hemoglobin: 8.7 g/dL — ABNORMAL LOW (ref 13.0–17.0)
MCH: 29 pg (ref 26.0–34.0)
MCHC: 34.1 g/dL (ref 30.0–36.0)
MCV: 85 fL (ref 80.0–100.0)
Platelets: 61 10*3/uL — ABNORMAL LOW (ref 150–400)
RBC: 3 MIL/uL — ABNORMAL LOW (ref 4.22–5.81)
RDW: 15.7 % — ABNORMAL HIGH (ref 11.5–15.5)
WBC: 12.8 10*3/uL — ABNORMAL HIGH (ref 4.0–10.5)
nRBC: 0 % (ref 0.0–0.2)

## 2020-09-20 LAB — APTT: aPTT: 40 seconds — ABNORMAL HIGH (ref 24–36)

## 2020-09-20 LAB — MAGNESIUM: Magnesium: 1.7 mg/dL (ref 1.7–2.4)

## 2020-09-20 MED ORDER — INSULIN GLARGINE 100 UNIT/ML ~~LOC~~ SOLN
15.0000 [IU] | Freq: Two times a day (BID) | SUBCUTANEOUS | Status: DC
  Administered 2020-09-20 (×2): 15 [IU] via SUBCUTANEOUS
  Filled 2020-09-20 (×3): qty 0.15

## 2020-09-20 MED ORDER — METOPROLOL SUCCINATE ER 25 MG PO TB24: 25.0000 mg | ORAL_TABLET | Freq: Every day | ORAL | Status: DC

## 2020-09-20 MED ORDER — INSULIN GLARGINE 100 UNIT/ML ~~LOC~~ SOLN: 13.0000 [IU] | Freq: Two times a day (BID) | SUBCUTANEOUS | Status: DC

## 2020-09-20 MED ORDER — INSULIN ASPART 100 UNIT/ML ~~LOC~~ SOLN
0.0000 [IU] | Freq: Three times a day (TID) | SUBCUTANEOUS | Status: DC
  Administered 2020-09-20: 3 [IU] via SUBCUTANEOUS
  Administered 2020-09-20: 11 [IU] via SUBCUTANEOUS
  Administered 2020-09-20: 5 [IU] via SUBCUTANEOUS
  Administered 2020-09-21: 8 [IU] via SUBCUTANEOUS

## 2020-09-20 NOTE — Progress Notes (Signed)
Subjective:  Patient evaluated at bedside this AM. Reports he is feeling much better today and pain is controlled. Continues to eat well. Denies chest pain, dyspnea. Discussed plan for improved sugar control for optimal wound healing. Disposition plan for CIR.  Objective:  Vital signs in last 24 hours: Vitals:   09/19/20 1600 09/19/20 2020 09/19/20 2308 09/20/20 0451  BP: 124/62 117/60 (!) 115/58 (!) 126/58  Pulse: (!) 54 (!) 55 60 (!) 53  Resp: 18 17 16 14   Temp: 98.4 F (36.9 C) 98.3 F (36.8 C) 97.6 F (36.4 C) 98.3 F (36.8 C)  TempSrc: Oral Oral Oral Oral  SpO2: 99% 98% 98% 98%  Weight:    66.7 kg  Height:       Physical Exam: General: Elderly appearing man. Laying in bed, no acute distress CV: Bradycardic, regular rhythm. No m/r/g Pulm: Clear to auscultation bilaterally. Normal WOB. Abdomen: Soft, non-tender, non-distended. Normoactive bowel sounds. Incision clean, dry, intact on abdomen. MSK: R groin incision clean, dry, in tact. R BKA brace in place. L groin incision with gauze.  CBC Latest Ref Rng & Units 09/20/2020 09/19/2020 09/18/2020  WBC 4.0 - 10.5 K/uL 12.8(H) 14.6(H) 12.3(H)  Hemoglobin 13.0 - 17.0 g/dL 11/16/2020) 2.9(N) 10.3(L)  Hematocrit 39.0 - 52.0 % 25.5(L) 27.7(L) 30.5(L)  Platelets 150 - 400 K/uL 61(L) 78(L) 84(L)   BMP Latest Ref Rng & Units 09/20/2020 09/19/2020 09/18/2020  Glucose 70 - 99 mg/dL 11/16/2020) 211(H) 417(E)  BUN 8 - 23 mg/dL 13 12 10   Creatinine 0.61 - 1.24 mg/dL 081(K 4.81  Sodium 135 - 145 mmol/L 127(L) 129(L) 132(L)  Potassium 3.5 - 5.1 mmol/L 5.0 5.0 4.1  Chloride 98 - 111 mmol/L 94(L) 95(L) 101  CO2 22 - 32 mmol/L 28 25 23   Calcium 8.9 - 10.3 mg/dL 7.8(L) 7.8(L) 7.8(L)   Assessment/Plan: Mr. 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 Hospital course complicated by peri-operative new-onset atrial fibrillation/flutter (now returned to sinus rhythm), heparin-induced thrombocytopenia, and hyperglycemia. Patient currently undergoing PT/OT awaiting CIR placement.  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 Surgical wounds appear clean, dry, in tact. Patient to continue PT/OT with plans for CIR once medically stable. Appreciate vascular, orthopedic assistance with this case.  - C/w morphine, percocet - C/w Crestor, ASA - PT/OT - Pending CIR placement  #Hyperglycemia #Latent AI DM CBG's continue to be elevated in 200-300. Will continue to titrate insulin as needed to ensure optimal wound healing. - Increase Lantus 15u BID - SSI (increase to moderate) - CBG goal <180 - CBG monitoring qid  #Sinus bradycardia Patient previously requiring beta blocker and anticoagulant d/t peri-operative Afib/flutter. Patient has been sinus bradycardic (50-60) over the last 24 hours. Will d/c metoprolol today given soft BP's. Prior to arrival patient not on beta blocker. Will assess further need tomorrow AM. -  Hold metoprolol  #Heparin-induced thrombocytopenia PLT 78>61. Heparin d/c'd 3 days ago. Expect platelets to start normalizing after 3-4 days. If platelets continue to drop in AM will also consider other causes for thrombocytopenia.  - C/w fondaparinux 7.5mg  qd - Daily CBC  #Euvolemic hyponatremia Corrected Na 132 this AM, stable. Will continue to monitor  BMP to ensure no acute changes. - Daily BMP  DIET: CM IVF: n/a DVT PPX: Fonda - avoid heparin BOWEL: Senokot-S CODE: FULL FAM COM: n/a  Prior to Admission Living Arrangement: Home Anticipated Discharge Location: CIR Barriers to Discharge: medical management Dispo: Anticipated discharge in approximately 1-2 day(s).   Evlyn Kanner, MD 09/20/2020, 6:24 AM Pager: [phone] After 5pm on weekdays and 1pm on weekends: On Call pager [phone]

## 2020-09-20 NOTE — Progress Notes (Signed)
Patient ID: Peter Mendez., male   DOB: 05/08/1961, 60 y.o.   MRN: [id number] Patient is postop day 2 transtibial amputation on the right there is no drainage of the wound VAC canister there is a good suction seal.  Patient was previously at home and may require inpatient or outpatient therapy depending on recommendations from physical therapy.  At this time will try for inpatient rehab.

## 2020-09-20 NOTE — Progress Notes (Signed)
Mobility Specialist: Progress Note   09/20/20 1659  Mobility  Activity Refused mobility   Pt refused mobility stating he has been working on his leg exercises throughout the [facility] and is "worn out." Encouraged pt to conserve energy tomorrow as therapy would be working with him.   [facility] Pj Zehner Mobility Specialist Mobility Specialist Phone: [phone]

## 2020-09-20 NOTE — Progress Notes (Addendum)
[facility]  Subjective  - Much more alert to day and smiling.   Objective (!) 106/54 (!) 57 98.2 F (36.8 C) (Oral) 18 98%  Intake/Output Summary (Last 24 hours) at 09/20/2020 0813 Last data filed at 09/20/2020 0453 Gross per 24 hour  Intake 480 ml  Output 700 ml  Net -220 ml    Left LE doppler signals PT/peroneal intact, motor and sensation intact Abdomin incision healing well  Left groin with lymph leak clean dry dressing placed over groin incision.  Right groin soft without drainage Lungs non labored breathing Sinus brady 57 Right LE managed by Dr. Lajoyce Corners dressing intact  Assessment/Planning: POD #  8 status post aortobifemoral bypass and a right common femoral to below-knee popliteal bypass POD # 2 BKA  Heparin InducedThrombocytopenia. Antibody +, SRA in process. Heparin now on allergy list -ContinueFondaparinux 7.5   Patent inflow left LE Left groin Lymphocele leak  Dry guaze No erythema or edema Leukocytosis improving   Peter Mendez 09/20/2020 8:13 AM --  Laboratory Lab Results: Recent Labs    09/19/20 0235 09/20/20 0155  WBC 14.6* 12.8*  HGB 9.4* 8.7*  HCT 27.7* 25.5*  PLT 78* 61*   BMET Recent Labs    09/19/20 0235 09/20/20 0155  NA 129* 127*  K 5.0 5.0  CL 95* 94*  CO2 25 28  GLUCOSE 318* 322*  BUN 12 13  CREATININE 0.76 0.80  CALCIUM 7.8* 7.8*    COAG Lab Results  Component Value Date   INR 1.2 09/18/2020   INR 1.2 09/11/2020   No results found for: PTT  I have seen and evaluated the patient and agree with the above plan  Durene Cal

## 2020-09-21 DIAGNOSIS — Z951 Presence of aortocoronary bypass graft: Secondary | ICD-10-CM

## 2020-09-21 DIAGNOSIS — D7582 Heparin induced thrombocytopenia (HIT): Secondary | ICD-10-CM

## 2020-09-21 DIAGNOSIS — I483 Typical atrial flutter: Secondary | ICD-10-CM

## 2020-09-21 DIAGNOSIS — I998 Other disorder of circulatory system: Secondary | ICD-10-CM | POA: Diagnosis not present

## 2020-09-21 DIAGNOSIS — B356 Tinea cruris: Secondary | ICD-10-CM

## 2020-09-21 DIAGNOSIS — Z89511 Acquired absence of right leg below knee: Secondary | ICD-10-CM

## 2020-09-21 DIAGNOSIS — I480 Paroxysmal atrial fibrillation: Secondary | ICD-10-CM | POA: Diagnosis not present

## 2020-09-21 DIAGNOSIS — D75829 Heparin-induced thrombocytopenia, unspecified: Secondary | ICD-10-CM | POA: Diagnosis not present

## 2020-09-21 LAB — LACTATE DEHYDROGENASE: LDH: 184 U/L (ref 98–192)

## 2020-09-21 LAB — SURGICAL PATHOLOGY

## 2020-09-21 LAB — CBC
HCT: 26.3 % — ABNORMAL LOW (ref 39.0–52.0)
HCT: 28.3 % — ABNORMAL LOW (ref 39.0–52.0)
Hemoglobin: 9 g/dL — ABNORMAL LOW (ref 13.0–17.0)
Hemoglobin: 9.4 g/dL — ABNORMAL LOW (ref 13.0–17.0)
MCH: 29.2 pg (ref 26.0–34.0)
MCH: 29.7 pg (ref 26.0–34.0)
MCHC: 34.2 g/dL (ref 30.0–36.0)
MCHC: 33.2 g/dL (ref 30.0–36.0)
MCV: 85.4 fL (ref 80.0–100.0)
MCV: 89.3 fL (ref 80.0–100.0)
Platelets: 48 10*3/uL — ABNORMAL LOW (ref 150–400)
Platelets: 45 10*3/uL — ABNORMAL LOW (ref 150–400)
RBC: 3.08 MIL/uL — ABNORMAL LOW (ref 4.22–5.81)
RBC: 3.17 MIL/uL — ABNORMAL LOW (ref 4.22–5.81)
RDW: 15.8 % — ABNORMAL HIGH (ref 11.5–15.5)
RDW: 16.4 % — ABNORMAL HIGH (ref 11.5–15.5)
WBC: 13.3 10*3/uL — ABNORMAL HIGH (ref 4.0–10.5)
WBC: 13.3 10*3/uL — ABNORMAL HIGH (ref 4.0–10.5)
nRBC: 0 % (ref 0.0–0.2)
nRBC: 0 % (ref 0.0–0.2)

## 2020-09-21 LAB — GLUCOSE, CAPILLARY
Glucose-Capillary: 255 mg/dL — ABNORMAL HIGH (ref 70–99)
Glucose-Capillary: 213 mg/dL — ABNORMAL HIGH (ref 70–99)
Glucose-Capillary: 207 mg/dL — ABNORMAL HIGH (ref 70–99)
Glucose-Capillary: 184 mg/dL — ABNORMAL HIGH (ref 70–99)
Glucose-Capillary: 215 mg/dL — ABNORMAL HIGH (ref 70–99)
Glucose-Capillary: 181 mg/dL — ABNORMAL HIGH (ref 70–99)

## 2020-09-21 LAB — DIC (DISSEMINATED INTRAVASCULAR COAGULATION)PANEL
D-Dimer, Quant: 6.42 ug/mL-FEU — ABNORMAL HIGH (ref 0.00–0.50)
Fibrinogen: 269 mg/dL (ref 210–475)
INR: 1.2 (ref 0.8–1.2)
Platelets: 47 10*3/uL — ABNORMAL LOW (ref 150–400)
Prothrombin Time: 14.3 seconds (ref 11.4–15.2)
Smear Review: NONE SEEN
aPTT: 40 seconds — ABNORMAL HIGH (ref 24–36)

## 2020-09-21 LAB — BASIC METABOLIC PANEL
Anion gap: 10 (ref 5–15)
BUN: 12 mg/dL (ref 8–23)
CO2: 26 mmol/L (ref 22–32)
Calcium: 7.7 mg/dL — ABNORMAL LOW (ref 8.9–10.3)
Chloride: 93 mmol/L — ABNORMAL LOW (ref 98–111)
Creatinine, Ser: 0.86 mg/dL (ref 0.61–1.24)
GFR, Estimated: >60 mL/min (ref >60)
Glucose, Bld: 290 mg/dL — ABNORMAL HIGH (ref 70–99)
Potassium: 4.5 mmol/L (ref 3.5–5.1)
Sodium: 129 mmol/L — ABNORMAL LOW (ref 135–145)

## 2020-09-21 LAB — HEPATIC FUNCTION PANEL
ALT: 16 U/L (ref 0–44)
AST: 31 U/L (ref 15–41)
Albumin: 2.1 g/dL — ABNORMAL LOW (ref 3.5–5.0)
Alkaline Phosphatase: 83 U/L (ref 38–126)
Bilirubin, Direct: 0.2 mg/dL (ref 0.0–0.2)
Indirect Bilirubin: 0.3 mg/dL (ref 0.3–0.9)
Total Bilirubin: 0.5 mg/dL (ref 0.3–1.2)
Total Protein: 4.6 g/dL — ABNORMAL LOW (ref 6.5–8.1)

## 2020-09-21 LAB — HEPARIN INDUCED PLATELET AB (HIT ANTIBODY): Heparin Induced Plt Ab: 3.302 OD — ABNORMAL HIGH (ref 0.000–0.400)

## 2020-09-21 LAB — APTT: aPTT: 40 seconds — ABNORMAL HIGH (ref 24–36)

## 2020-09-21 LAB — MAGNESIUM: Magnesium: 1.7 mg/dL (ref 1.7–2.4)

## 2020-09-21 MED ORDER — CLOTRIMAZOLE 1 % EX SOLN
Freq: Two times a day (BID) | CUTANEOUS | Status: DC
  Filled 2020-09-21 (×2): qty 10

## 2020-09-21 MED ORDER — INSULIN ASPART 100 UNIT/ML ~~LOC~~ SOLN
3.0000 [IU] | Freq: Three times a day (TID) | SUBCUTANEOUS | Status: DC
  Administered 2020-09-21 – 2020-09-24 (×6): 3 [IU] via SUBCUTANEOUS

## 2020-09-21 MED ORDER — INSULIN GLARGINE 100 UNIT/ML ~~LOC~~ SOLN
20.0000 [IU] | Freq: Two times a day (BID) | SUBCUTANEOUS | Status: DC
  Administered 2020-09-21 (×2): 20 [IU] via SUBCUTANEOUS
  Filled 2020-09-21 (×4): qty 0.2

## 2020-09-21 MED ORDER — MORPHINE SULFATE (PF) 2 MG/ML IV SOLN
2.0000 mg | Freq: Three times a day (TID) | INTRAVENOUS | Status: AC | PRN
Start: 2020-09-21 — End: 2020-09-23
  Administered 2020-09-22 (×2): 2 mg via INTRAVENOUS
  Filled 2020-09-21 (×2): qty 1

## 2020-09-21 MED ORDER — INSULIN ASPART 100 UNIT/ML ~~LOC~~ SOLN
0.0000 [IU] | Freq: Three times a day (TID) | SUBCUTANEOUS | Status: DC
  Administered 2020-09-21: 7 [IU] via SUBCUTANEOUS
  Administered 2020-09-21 – 2020-09-22 (×2): 4 [IU] via SUBCUTANEOUS
  Administered 2020-09-23 (×2): 11 [IU] via SUBCUTANEOUS
  Administered 2020-09-24: 4 [IU] via SUBCUTANEOUS

## 2020-09-21 MED ORDER — CLOTRIMAZOLE 1 % EX CREA
TOPICAL_CREAM | Freq: Two times a day (BID) | CUTANEOUS | Status: DC
  Filled 2020-09-21 (×2): qty 15

## 2020-09-21 NOTE — Progress Notes (Signed)
Occupational Therapy Treatment Patient Details Name: Peter Mendez. MRN: [id number] DOB: 01-24-1961 Today's Date: 09/21/2020    History of present illness 60 yo s/p aortobifem bypass graft, R fem pop bypass due to critical limb ischemia. 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 PMH PVD, RLE dry gangrene, neuropathy, autoimmune diabetes, tobacco use, HTN, vision impairment.   OT comments  Pt making steady progress towards OT goals this session. Pt continues to present with imparied balance, baseline visual deficits,and impulsivity/ cognitive deficits impacting pts ability to complete BADLs independently. Pt on phone upon arrival but wanting to get OOB upon OTA arrival. Pt impulsive with mobility needing cues for safety awareness. Pt able to complete simulated toilet transfer to recliner with MIN- MOD A with RW. Pt currently requires set-up-  supervision for LB dressing from long sitting in bed. Pt agreeable to potentially CIR admit pt would continue to benefit from skilled occupational therapy while admitted and after d/c to address the below listed limitations in order to improve overall functional mobility and facilitate independence with BADL participation. DC plan remains appropriate, will follow acutely per POC.     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, fully blind L eye; wound vac Restrictions Weight Bearing Restrictions: Yes RLE Weight Bearing: Non weight bearing Other Position/Activity Restrictions: new BKA, residual limb protector       Mobility Bed Mobility Overal bed mobility: Needs Assistance Bed Mobility: Supine to Sit     Supine to sit: Min guard;HOB elevated     General bed mobility comments:  minguard for safety and line mgmt  Transfers Overall transfer level: Needs assistance Equipment used: Rolling walker (2 wheeled) Transfers: Sit to/from [employer] Sit to Stand: Min assist;From elevated surface Stand pivot transfers: Min assist;Mod assist       General transfer comment: MIN A to rise from EOB with cues for hand placement, MIN - MOD A for stand pivot transfer, pt able to hop during session but needed MOD A at times for balance and safety awareness    Balance Overall balance assessment: Needs assistance Sitting-balance support: Feet supported;Single extremity supported Sitting balance-Leahy Scale: Poor Sitting balance - Comments: pt impulsively attempting to stand from EOB with no RW, minguard for safety awareness from EOB   Standing balance support: Bilateral upper extremity supported   Standing balance comment: reliant on RW and external assist                           ADL either performed or assessed with clinical judgement   ADL Overall ADL's : Needs assistance/impaired             Lower Body Bathing: Supervison/ safety;Set up Lower Body Bathing Details (indicate cue type and reason): simulated via LB dressing from long sitting in bed with supervision     Lower Body Dressing: Supervision/safety;Set up Lower Body Dressing Details (indicate cue type and reason): pt able to don sock on pts RLE with set- up- supervision assist from long sitting in bed Toilet Transfer: Minimal assistance;Cueing for safety;RW;Stand-pivot;Moderate assistance Toilet Transfer Details (indicate cue type and reason): MIN A to power up from EOB with cues for handplacement, MOD A at times for safety awareness as pt impulsive  with mobility tasks.         Functional mobility during ADLs: Minimal assistance;Cueing for safety;Moderate assistance;Rolling walker General ADL Comments: pt continues to present with imparied balance, baseline visual deficits,and  impulsivity/ cognitive deficits impacting pts ability to complete BADLs independently.     Vision Baseline Vision/History: Legally blind Patient Visual Report: No change from baseline     Perception     Praxis      Cognition Arousal/Alertness: Awake/alert Behavior During Therapy: Restless;Impulsive Overall Cognitive Status: No family/caregiver present to determine baseline cognitive functioning Area of Impairment: Attention;Memory;Following commands;Safety/judgement;Problem solving                   Current Attention Level: Selective Memory: Decreased short-term memory Following Commands: Follows one step commands with increased time Safety/Judgement: Decreased awareness of safety;Decreased awareness of deficits   Problem Solving: Slow processing;Requires verbal cues;Requires tactile cues General Comments: pt easily distracted during session noted to repeat statements throughout session. pt presents with impulsivity needing cues for safety awareness. pt following commands but with increased time.        Exercises     Shoulder Instructions       General Comments VSS    Pertinent Vitals/ Pain       Pain Assessment: No/denies pain  Home Living                                          Prior Functioning/Environment              Frequency  Min 2X/week        Progress Toward Goals  OT Goals(current goals can now be found in the care plan section)  Progress towards OT goals: Progressing toward goals  Acute Rehab OT Goals Patient Stated Goal: wants to get to chair OT Goal Formulation: With patient Time For Goal Achievement: 10/03/20 Potential to Achieve Goals: Fair  Plan Discharge plan remains appropriate;Frequency remains appropriate    Co-evaluation                 AM-PAC OT "6 Clicks" Daily Activity     Outcome Measure   Help from another person eating meals?: A Little (set- up d/t visual deficits) Help from another  person taking care of personal grooming?: A Little (set- up d/t visual deficits) Help from another person toileting, which includes using toliet, bedpan, or urinal?: A Lot Help from another person bathing (including washing, rinsing, drying)?: A Lot Help from another person to put on and taking off regular upper body clothing?: None Help from another person to put on and taking off regular lower body clothing?: A Lot 6 Click Score: 16    End of Session Equipment Utilized During Treatment: Gait belt;Rolling walker  OT Visit Diagnosis: Unsteadiness on feet (R26.81)   Activity Tolerance Patient tolerated treatment well   Patient Left in chair;with call bell/phone within reach;with chair alarm set;with nursing/sitter in room   Nurse Communication Mobility status        Time: 2725-3664 OT Time Calculation (min): 24 min  Charges: OT General Charges $OT Visit: 1 Visit OT Treatments $Self Care/Home Management : 23-37 mins  Lenor Derrick., COTA/L Acute Rehabilitation Services [phone]    Barron Schmid 09/21/2020, 11:16 AM

## 2020-09-21 NOTE — Progress Notes (Signed)
Inpatient Rehab Admissions Coordinator:   I do not have a bed for this patient on CIR today or tomorrow.  Note that patient progressing well with therapy (supervision to min assist with OT this morning) and may not qualify for CIR by the time a bed is available.  May need to consider alternative venue for rehab needs if he continues to progress with therapy.   Estill Dooms, PT, DPT Admissions Coordinator [phone] 09/21/20  11:58 AM

## 2020-09-21 NOTE — Progress Notes (Signed)
Subjective:  Patient evaluated at bedside this AM. Reports his pain is well-controlled, no new complaints at this time. Denies further bleeding, headaches, chest pain, dyspnea, palpitations. He does mention his abdomen feels more distended, but reports flatus and recent BM.   Objective:  Vital signs in last 24 hours: Vitals:   09/20/20 2346 09/21/20 0258 09/21/20 0300 09/21/20 0438  BP: 119/63 109/66 109/66 115/79  Pulse: 66 79 65 70  Resp: 20 20  18   Temp: 98.1 F (36.7 C) 98.7 F (37.1 C)  98.1 F (36.7 C)  TempSrc: Oral Oral  Oral  SpO2: 100% 100% 99% 99%  Weight:  66.5 kg    Height:       Physical Exam: General: Laying in bed, no acute distress CV: Regular rate, rhythm. No murmurs, rubs, gallops Abdomen: Soft, non-tender, mildly distended. Normoactive bowel sounds. Skin: Midline abdominal incision clean, dry, in tact. L and R groin incisions clean, dry, in tact. Multiple bruises throughout his body. GU: Erythematous, mildly painful medial L thigh and scrotum  CBC Latest Ref Rng & Units 09/21/2020 09/21/2020 09/20/2020  WBC 4.0 - 10.5 K/uL - 13.3(H) 12.8(H)  Hemoglobin 13.0 - 17.0 g/dL - 9.0(L) 8.7(L)  Hematocrit 39.0 - 52.0 % - 26.3(L) 25.5(L)  Platelets 150 - 400 K/uL 47(L) 48(L) 61(L)   BMP Latest Ref Rng & Units 09/21/2020 09/20/2020 09/19/2020  Glucose 70 - 99 mg/dL 11/17/2020) 169(C) 789(F)  BUN 8 - 23 mg/dL 12 13 12   Creatinine 0.61 - 1.24 mg/dL 810(F 7.51  Sodium 135 - 145 mmol/L 129(L) 127(L) 129(L)  Potassium 3.5 - 5.1 mmol/L 4.5 5.0 5.0  Chloride 98 - 111 mmol/L 93(L) 94(L) 95(L)  CO2 22 - 32 mmol/L 26 28 25   Calcium 8.9 - 10.3 mg/dL 7.7(L) 7.8(L) 7.8(L)   Assessment/Plan: Mr. 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 Hospital course complicated by peri-operative new-onset atrial  fibrillation/flutter with benzodiazepine withdrawal, now back in sinus rhythm, heparin-induced thrombocytopenia, and hyperglycemia. Overall patient stable but given acute drop in platelets will need to continue to monitor prior to discharge.  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 No signs of active bleeding, surgical scars clean, dry, in tact. Will continue PT/OT with plans for CIR once stable. Given platelets continue to drop, will need to monitor incisions daily. Appreciate vascular, orthopedic assistance with this case.  - C/w morphine, percocet - C/w crestor, ASA - PT/OT - Dispo: CIR  #Heparin-induced thrombocytopenia, type 2 Platelets continue to drop, 61>48. No signs of active bleeding on exam. Patient's abdomen more distended today, but Hgb stable and does not appear to be active bleeding. If patient has new-onset abdominal pain/HA/other concerning symptoms, low threshold to image for bleed. Given platelets have continued to drop after stopping heparin 4 days ago, further thrombocytopenia work-up pending this morning although likely continuance of heparin exposure. Although rare, fondaparinux can  also cause thrombocytopenia. Will discuss with pharmacy regarding further anticoagulation, can consider argatroban, bivalirudin. 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 - Discuss with pharmacy regarding further anticoagulation - Pending DIC panel, ADAMTS13, haptoglobin,  LDH - Daily CBC  #Latent autoimmune diabetes mellitus CBGs remain above goal, patient states he usually is better controlled at home. Will adjust insulin as needed and continue to monitor. - Increase lantus to 20U BID - Increase SSI to resistent - Add 3U of meal coverage - CBG goal <180  #Sinus bradycardia, resolved HR improved overnight to 70's. BP remains soft with systolic in 90-100's. No signs of atrial fibrillation on exam or tele. Will hold metoprolol at this time and continue to assess daily. - Hold metoprolol - Tele  #Hyponatremia Corrected Na 132>132. No acute changes in volume status.  - Daily BMP  #Tinea cruris Patient complaining of itching and burning in groin. On exam, L groin and scrotum appears erythematous. Will start topical treatment. - Start clotrimazole BID  DIET: CM IVF: n/a DVT PPX: Discussing w/ pharmacy [facility] BOWEL: Senokot-S CODE: FULL FAM COM: n/a  Prior to Admission Living Arrangement: Home Anticipated Discharge Location: CIR Barriers to Discharge: medical management Dispo: Anticipated discharge in approximately 1-2 day(s).   Evlyn Kanner, MD 09/21/2020, 5:51 AM Pager: [phone] After 5pm on weekdays and 1pm on weekends: On Call pager [phone]

## 2020-09-21 NOTE — Progress Notes (Signed)
Mobility Specialist - Progress Note   09/21/20 1149  Mobility  Activity Ambulated in room  Level of Assistance Contact guard assist, steadying assist  Assistive Device Front wheel walker  Distance Ambulated (ft) 20 ft (10 ft x 2)  Mobility Response Tolerated well  Mobility performed by Mobility specialist  $Mobility charge 1 Mobility   Pre-mobility: 66 HR, 111/56 BP Post-mobility: 67 HR, 120/55 BP  Pt able to stand from recliner w/o assist. He then was able to ambulate in 2 bouts of 10 ft, seated rest break in between. Asx throughout. Pt back to recliner after walk.   Mamie Levers Mobility Specialist Mobility Specialist Phone: [phone]

## 2020-09-21 NOTE — Progress Notes (Addendum)
Progress Note  Patient Name: Peter Mendez. Date of Encounter: 09/21/2020  Primary Cardiologist: Donato Schultz, MD  Subjective   Denies any recent chest pain, SOB. No acute complaints.  Inpatient Medications    Scheduled Meds: . acetaminophen  650 mg Oral BID  . aspirin EC  81 mg Oral Daily  . chlorhexidine  15 mL Mouth Rinse BID  . Chlorhexidine Gluconate Cloth  6 each Topical Daily  . fondaparinux (ARIXTRA) injection  7.5 mg Subcutaneous Q24H  . gabapentin  300 mg Oral TID  . insulin aspart  0-20 Units Subcutaneous TID WC  . insulin aspart  3 Units Subcutaneous TID WC  . insulin glargine  20 Units Subcutaneous BID  . mouth rinse  15 mL Mouth Rinse q12n4p  . nicotine  21 mg Transdermal Daily  . pantoprazole  40 mg Oral Daily  . rosuvastatin  20 mg Oral Daily  . senna-docusate  1 tablet Oral Daily   Continuous Infusions: . sodium chloride    . sodium chloride 50 mL/hr at 09/17/20 1734  . sodium chloride 75 mL/hr at 09/18/20 1010  . magnesium sulfate bolus IVPB     PRN Meds: sodium chloride, acetaminophen **OR** acetaminophen, ALPRAZolam, magnesium sulfate bolus IVPB, morphine injection, ondansetron, oxyCODONE-acetaminophen, phenol   Vital Signs    Vitals:   09/21/20 0258 09/21/20 0300 09/21/20 0438 09/21/20 0753  BP: 109/66 109/66 115/79 (!) 111/56  Pulse: 79 65 70 62  Resp: 20  18 18   Temp: 98.7 F (37.1 C)  98.1 F (36.7 C) 98.4 F (36.9 C)  TempSrc: Oral  Oral Oral  SpO2: 100% 99% 99% 96%  Weight: 66.5 kg     Height:        Intake/Output Summary (Last 24 hours) at 09/21/2020 1049 Last data filed at 09/21/2020 1036 Gross per 24 hour  Intake 490 ml  Output 3625 ml  Net -3135 ml   Last 3 Weights 09/21/2020 09/20/2020 09/19/2020  Weight (lbs) 146 lb 9.7 oz 147 lb 0.8 oz 141 lb 15.6 oz  Weight (kg) 66.5 kg 66.7 kg 64.4 kg     Telemetry    NSR HR 50s-60s - Personally Reviewed  Physical Exam   GEN: No acute distress, poorly nourished appearing   HEENT: Normocephalic, atraumatic, sclera non-icteric. Neck: No JVD or bruits. Cardiac: RRR no murmurs, rubs, or gallops.  Respiratory: Clear to auscultation bilaterally. Breathing is unlabored. GI: Soft, nontender, non-distended, BS +x 4. MS: no deformity. Extremities: No clubbing or cyanosis. S/p RLE BKA, no edema LLE Neuro:  AAOx3. Follows commands. Psych:  Responds to questions appropriately with a normal affect.  Labs    High Sensitivity Troponin:  No results for input(s): TROPONINIHS in the last 720 hours.    Cardiac EnzymesNo results for input(s): TROPONINI in the last 168 hours. No results for input(s): TROPIPOC in the last 168 hours.   Chemistry Recent Labs  Lab 09/19/20 0235 09/20/20 0155 09/21/20 0113 09/21/20 0713  NA 129* 127* 129*  --   K 5.0 5.0 4.5  --   CL 95* 94* 93*  --   CO2 25 28 26   --   GLUCOSE 318* 322* 290*  --   BUN 12 13 12   --   CREATININE 0.76 0.80 0.86  --   CALCIUM 7.8* 7.8* 7.7*  --   PROT  --   --   --  4.6*  ALBUMIN  --   --   --  2.1*  AST  --   --   --  31  ALT  --   --   --  16  ALKPHOS  --   --   --  83  BILITOT  --   --   --  0.5  GFRNONAA >60 >60 >60  --   ANIONGAP 9 5 10   --      Hematology Recent Labs  Lab 09/19/20 0235 09/20/20 0155 09/21/20 0113 09/21/20 0713  WBC 14.6* 12.8* 13.3*  --   RBC 3.24* 3.00* 3.08*  --   HGB 9.4* 8.7* 9.0*  --   HCT 27.7* 25.5* 26.3*  --   MCV 85.5 85.0 85.4  --   MCH 29.0 29.0 29.2  --   MCHC 33.9 34.1 34.2  --   RDW 15.1 15.7* 15.8*  --   PLT 78* 61* 48* 47*    BNPNo results for input(s): BNP, PROBNP in the last 168 hours.   DDimer  Recent Labs  Lab 09/21/20 0713  DDIMER 6.42*     Radiology    No results found.  Cardiac Studies   Echo 09/09/20 1. . Left ventricular ejection fraction, by estimation, is 50 to 55%. The  left ventricle has low normal function. The left ventricle demonstrates  regional wall motion abnormalities (see scoring diagram/findings for   description). Akinesis of apical  septal/anterior walls and apex. There is mild left ventricular  hypertrophy. Left ventricular diastolic parameters were normal.  2. Right ventricular systolic function is normal. The right ventricular  size is normal. Tricuspid regurgitation signal is inadequate for assessing  PA pressure.  3. The mitral valve is normal in structure. No evidence of mitral valve  regurgitation. No evidence of mitral stenosis.  4. The aortic valve is tricuspid. Aortic valve regurgitation is not  visualized. Mild to moderate aortic valve sclerosis/calcification is  present, without any evidence of aortic stenosis.  5. The inferior vena cava is dilated in size with >50% respiratory  variability, suggesting right atrial pressure of 8 mmHg.  6. Echodensity seen in IVC (best seen on image 64), could represent  thrombus versus artifact. Recommend IVC duplex for further evaluation   Patient Profile     60 y.o. male with severe PAD, dry gangrene, neuropathy, tobacco abuse, HTN, IDDM, hyponatremia, protein calorie malnutrition admitted with worsening RLE pain RLE gangrene. Originally seen 2/1 for pre-op evaluation and underwent echo showing EF 50-55% with akinesis of the apical septal/anterior walls and apex, mild LVH, echodensity seen in IVC, could represent thrombus vs artifact. (F/u duplex 2/8 without evidence for thrombus.) Went to OR 2/4 for aortobifem bypass grafting with R fem to below knee pop bypass.  Post-op atrial fib noted 2/7, requiring initiation of IV amiodarone and anticoagulation. 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  Assessment & Plan   1. PAD, - s/p status post aortobifemoral bypass and a right common femoral to below-knee popliteal bypass as well as right BKA with wound vac  secondary to non viable right heel wound - per primary teams - being followed by IM, ortho, VVS   2. Post-op atrial fib/flutter - utilized IV amiodarone initially with conversion to NSR/SB therefore amiodarone stopped - initially on anticoagulation, but given decreasing platelet count and the fact that arrhythmia was triggered by post-op state, no plan for ongoing anticoagulation at this time aside from what vascular team has planned for his PAD -  if he has recurrent atrial fib/flutter, would need to revisit need for Eliquis - Toprol stopped yesterday due to sinus bradycardia with HR 50s-60s, continue to follow off BB for now - check thyroid in AM for completeness - will defer to IM if abnormal  3. Abnormal echocardiogram - 2D echo 09/09/20 showed EF 50-55% with akinesis of the apical septal/anterior walls and apex, mild LVH, echodensity seen in IVC, could represent thrombus vs artifact. (F/u duplex 2/8 without evidence for thrombus) - can consider OP stress testing for risk stratification given wall motion abnormalities vs observation for anginal symptoms in the future - continue risk factor modification in the meantime  4. Essential HTN - BP normal  5. Other medical issues noted, recommended management per primary team - hyponatremia, hypocalcemia - anemia, thrombocytopenia, leukocytosis - DM  Anticipate we may be able to sign off - would suggest contacting our service when patient is closer to discharge to arrange OP follow-up.  For questions or updates, please contact [facility] Please consult [email or web address] for contact info under Cardiology/STEMI.  Signed, Laurann Montana, PA-C 09/21/2020, 10:49 AM

## 2020-09-21 NOTE — Progress Notes (Addendum)
Progress Note    09/21/2020 7:04 AM 3 Days Post-Op  Subjective:  Restless night, fatigued. Says he ate all of regular dinner last night. Voiding spontaneously.   Vitals:   09/21/20 0300 09/21/20 0438  BP: 109/66 115/79  Pulse: 65 70  Resp:  18  Temp:  98.1 F (36.7 C)  SpO2: 99% 99%   UOP: 3125cc sys BP 110s  Physical Exam: General appearance: Awake, alert in no apparent distress Cardiac: Heart rate and rhythm are regular Respirations: Nonlabored Incisions: Bilateral groin, right thigh and mid-line incisions are all well approximated without bleeding or hematoma Extremities: LLE warm with intact sensation and motor function.  Right BKA limb protector and wound VAC in place. Abdomen: soft, ND, +BS    CBC    Component Value Date/Time   WBC 13.3 (H) 09/21/2020 0113   RBC 3.08 (L) 09/21/2020 0113   HGB 9.0 (L) 09/21/2020 0113   HCT 26.3 (L) 09/21/2020 0113   PLT 48 (L) 09/21/2020 0113   MCV 85.4 09/21/2020 0113   MCH 29.2 09/21/2020 0113   MCHC 34.2 09/21/2020 0113   RDW 15.8 (H) 09/21/2020 0113   LYMPHSABS 1.4 09/09/2020 0550   MONOABS 0.7 09/09/2020 0550   EOSABS 0.2 09/09/2020 0550   BASOSABS 0.0 09/09/2020 0550    BMET    Component Value Date/Time   NA 129 (L) 09/21/2020 0113   K 4.5 09/21/2020 0113   CL 93 (L) 09/21/2020 0113   CO2 26 09/21/2020 0113   GLUCOSE 290 (H) 09/21/2020 0113   BUN 12 09/21/2020 0113   CREATININE 0.86 09/21/2020 0113   CALCIUM 7.7 (L) 09/21/2020 0113   GFRNONAA >60 09/21/2020 0113     Intake/Output Summary (Last 24 hours) at 09/21/2020 0704 Last data filed at 09/21/2020 0419 Gross per 24 hour  Intake 730 ml  Output 3125 ml  Net -2395 ml    HOSPITAL MEDICATIONS Scheduled Meds: . acetaminophen  650 mg Oral BID  . aspirin EC  81 mg Oral Daily  . chlorhexidine  15 mL Mouth Rinse BID  . Chlorhexidine Gluconate Cloth  6 each Topical Daily  . fondaparinux (ARIXTRA) injection  7.5 mg Subcutaneous Q24H  . gabapentin  300  mg Oral TID  . insulin aspart  0-15 Units Subcutaneous TID WC  . insulin glargine  20 Units Subcutaneous BID  . mouth rinse  15 mL Mouth Rinse q12n4p  . nicotine  21 mg Transdermal Daily  . pantoprazole  40 mg Oral Daily  . rosuvastatin  20 mg Oral Daily  . senna-docusate  1 tablet Oral Daily   Continuous Infusions: . sodium chloride    . sodium chloride 50 mL/hr at 09/17/20 1734  . sodium chloride 75 mL/hr at 09/18/20 1010  . magnesium sulfate bolus IVPB     PRN Meds:.sodium chloride, acetaminophen **OR** acetaminophen, ALPRAZolam, magnesium sulfate bolus IVPB, morphine injection, ondansetron, oxyCODONE-acetaminophen, phenol  Assessment and Plan: POD #10status post aortobifemoral bypass and a right common femoral to below-knee popliteal bypass with vein for CLI of the right lower extremity with tissue loss. Tolerating diet.  POD #3 right BKA with wound VAC>>Dr. 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 Platelet count continues to drift: 48k this morning. Repeat labs just drawn and results are pending>> Repeat platelet count>47k  VSS. Afebrile. Hgb relatively stable  Delirium>resolved   -DVT prophylaxis:  Lovenox  SANDRA SETZER, PA-C [facility] [phone] 09/21/2020  7:04 AM   I have independently interviewed and examined patient and agree with PA assessment and plan above.   Genean Adamski C. Randie Heinz, MD [facility] [place] Office: [phone] Pager: [phone]

## 2020-09-21 NOTE — Progress Notes (Signed)
Physical Therapy Treatment Patient Details Name: Peter Mendez. MRN: [id number] DOB: 06/17/1961 Today's Date: 09/21/2020    History of Present Illness 60 yo s/p aortobifem bypass graft, R fem pop bypass due to critical limb ischemia. 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 PMH PVD, RLE dry gangrene, neuropathy, autoimmune diabetes, tobacco use, HTN, vision impairment.    PT Comments    Pt received seated in chair, agreeable to therapy session and with good participation and tolerance for mobility. Primary session focus on progressing safety with sit<>stand transfers and seated/standing balance and exercise tasks. Pt performed AROM therapeutic exercises on RLE with good tolerance, will likely need reinforcement for proper compliance due to cognition but with noted improved command following and pt with fair carryover of safety instruction during serial sit<>stand transfers. Pt needing up to minA for sit<>stand and up to modA for gait task at bedside and stand pivot transfer using RW. Pt continues to benefit from PT services to progress toward functional mobility goals. Continue to recommend CIR.   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, fully blind L eye; wound vac Required Braces or Orthoses: Other Brace Other Brace: RLE limb guard Restrictions Weight Bearing Restrictions: Yes RLE Weight Bearing: Non weight bearing Other Position/Activity Restrictions: new BKA, residual limb protector    Mobility  Bed Mobility Overal bed mobility: Needs Assistance Bed Mobility: Sit to Supine       Sit to supine: Min guard   General bed mobility comments: min guard for safety and line mgmt     Transfers Overall transfer level: Needs assistance Equipment used: Rolling walker (2 wheeled) Transfers: Sit to/from [employer] Sit to Stand: Min assist;Min guard Stand pivot transfers: Mod assist       General transfer comment: MIN A to rise from EOB with cues for hand placement; x10 reps STS with cues needed for hand placement and fair carryover of instruction during session; MIN - MOD A for stand pivot transfer, pt able to hop during session but needed MOD A at times for balance and safety awareness  Ambulation/Gait Ambulation/Gait assistance: Mod assist;Min assist Gait Distance (Feet): 10 Feet Assistive device: Rolling walker (2 wheeled) Gait Pattern/deviations: Leaning posteriorly;Step-to pattern     General Gait Details: pt needing some assist to manage RW, some mild posterior/lateral LOB but able to correct with min/modA support, short gait task at bedside when transitioning from chair>bed   Stairs             Wheelchair Mobility    Modified Rankin (Stroke Patients Only)       Balance Overall balance assessment: Needs assistance Sitting-balance support: Feet supported;Single extremity supported Sitting balance-Leahy Scale: Poor Sitting balance - Comments: no LOB for static sitting, up to min guard with dynamic seated tasks due to occasional posterior lean Postural control: Posterior lean Standing balance support: Bilateral upper extremity supported Standing balance-Leahy Scale: Poor Standing balance comment: reliant on RW and external assist                            Cognition Arousal/Alertness: Awake/alert Behavior During Therapy: Anxious Overall Cognitive Status: Impaired/Different from baseline Area of Impairment: Memory;Safety/judgement;Problem solving  Current Attention Level: Selective;Sustained Memory: Decreased recall of precautions;Decreased short-term memory Following Commands: Follows  one step commands with increased time;Follows multi-step commands inconsistently Safety/Judgement: Decreased awareness of safety   Problem Solving: Difficulty sequencing;Requires verbal cues General Comments: pt pleasantly participatory, internally distracted during session due to concerns about insulin medication, RN notified      Exercises Amputee Exercises Quad Sets: AROM;Both;10 reps;Supine Hip Extension: AROM;Right;10 reps;Standing (multimodal cues provided as pt seeming confused by instruction and hopping backward instead of extending RLE backward initially) Hip ABduction/ADduction: AROM;Right;5 reps;Seated Hip Flexion/Marching: AROM;Right;5 reps;Supine Knee Flexion: AROM;Right;5 reps;Seated Knee Extension: AROM;Right;5 reps;Seated Straight Leg Raises: AROM;Right;10 reps;Supine    General Comments General comments (skin integrity, edema, etc.): SpO2 98% on RA, HR 75-95 bpm during seated/standing tasks, pt denies dizziness and BP not otherwise assessed.      Pertinent Vitals/Pain Pain Assessment: 0-10 Pain Score: 6  Pain Location: R residual limb, buttocks Pain Descriptors / Indicators: Grimacing;Guarding;Discomfort Pain Intervention(s): Monitored during session;Repositioned;Patient requesting pain meds-RN notified    Home Living                      Prior Function            PT Goals (current goals can now be found in the care plan section) Acute Rehab PT Goals Patient Stated Goal: ready to get back to bed PT Goal Formulation: With patient Time For Goal Achievement: 09/26/20 Potential to Achieve Goals: Good Progress towards PT goals: Progressing toward goals    Frequency    Min 3X/week      PT Plan Current plan remains appropriate    Co-evaluation              AM-PAC PT "6 Clicks" Mobility   Outcome Measure  Help needed turning from your back to your side while in a flat bed without using bedrails?: None Help needed moving from lying on  your back to sitting on the side of a flat bed without using bedrails?: A Little Help needed moving to and from a bed to a chair (including a wheelchair)?: A Lot Help needed standing up from a chair using your arms (e.g., wheelchair or bedside chair)?: A Little Help needed to walk in hospital room?: A Lot Help needed climbing 3-5 steps with a railing? : Total 6 Click Score: 15    End of Session Equipment Utilized During Treatment: Gait belt Activity Tolerance: Patient tolerated treatment well Patient left: in bed;with call bell/phone within reach;with bed alarm set;with SCD's reapplied (SCD on LLE only) Nurse Communication: Mobility status;Other (comment) (pt asking about insulin/blood sugar level) PT Visit Diagnosis: Unsteadiness on feet (R26.81);Muscle weakness (generalized) (M62.81);Pain Pain - Right/Left: Right Pain - part of body: Leg     Time: 1829-9371 PT Time Calculation (min) (ACUTE ONLY): 28 min  Charges:  $Therapeutic Exercise: 8-22 mins $Therapeutic Activity: 8-22 mins                     Adien Kimmel P., PTA Acute Rehabilitation Services Pager: [phone] Office: [phone]   Angus Palms 09/21/2020, 5:21 PM

## 2020-09-21 NOTE — Care Management Important Message (Signed)
Important Message  Patient Details  Name: Peter Mendez. MRN: [id number] Date of Birth: Jan 15, 1961   Medicare Important Message Given:  Yes     Renie Ora 09/21/2020, 10:31 AM

## 2020-09-21 NOTE — Progress Notes (Signed)
Pharmacy Heparin Induced Thrombocytopenia (HIT) Note:  Peter Mendez. is an 60 y.o. male being evaluated for HIT. Heparin IV was started 2/8 for atrial fibrillation (patient had been on SQ hep for DVT ppx since 2/1), and baseline platelets were 229.  Patient is s/p aortobifemoral bypass on 2/4 and R BKA on 2/11 and received cefazolin for surgical prophylaxis.  HIT labs were ordered on 2/10 when platelets dropped to 200>111> continue to fall 47 today off heparin since 2/10.   Patient being anticoagulated for HIT with fondaparinux 7.5mg  daily.  H/h low stable  Recheck of HIT antibody and SRA drawn 2/12 will follow up results.    Auto-populate labs:  Heparin Induced Plt Ab  Date/Time Value Ref Range Status  09/17/2020 07:49 AM 2.599 (H) 0.000 - 0.400 OD Final    Comment:    (NOTE) Performed At: [facility] [address] [place], [place] [id number] Jolene Schimke MD NG:[id number]      CALCULATE SCORE:  4Ts (see the HIT Algorithm) Score  Thrombocytopenia 1  Timing 2  Thrombosis 0  Other causes of thrombocytopenia 0  Total 3     Recommendations (A or B) are based on available lab results (HIT antibody and/or SRA) and the HIT algorithm    A. HIT antibody result available  Possible HIT    Order SRA:  Yes  Discontinue heparin / LMWH:  Yes  Initiate alternative anticoagulation:  Yes  Document heparin allergy:  Yes  B. SRA result availability  SRA not available   Plan (Discussed with provider) Labs ordered:  SRA ordered  Heparin allergy:  Heparin allergy documented or updated. Anticoagulation plans:  Begin alternative anticoagulation with fondaparinux 7.5 mg SQ every 24 hours  Comments: avoiding argatroban or bivalrudin given patient pulling out IV lines.   Leota Sauers Pharm.D. CPP, BCPS Clinical Pharmacist [phone] 09/21/2020 1:00 PM    Please check [email or web address] for unit specific pharmacy phone numbers.

## 2020-09-22 ENCOUNTER — Telehealth: Payer: Self-pay

## 2020-09-22 DIAGNOSIS — B356 Tinea cruris: Secondary | ICD-10-CM

## 2020-09-22 LAB — CBC
HCT: 26.8 % — ABNORMAL LOW (ref 39.0–52.0)
Hemoglobin: 9.4 g/dL — ABNORMAL LOW (ref 13.0–17.0)
MCH: 30.3 pg (ref 26.0–34.0)
MCHC: 35.1 g/dL (ref 30.0–36.0)
MCV: 86.5 fL (ref 80.0–100.0)
Platelets: 47 10*3/uL — ABNORMAL LOW (ref 150–400)
RBC: 3.1 MIL/uL — ABNORMAL LOW (ref 4.22–5.81)
RDW: 16.9 % — ABNORMAL HIGH (ref 11.5–15.5)
WBC: 12.1 10*3/uL — ABNORMAL HIGH (ref 4.0–10.5)
nRBC: 0 % (ref 0.0–0.2)

## 2020-09-22 LAB — GLUCOSE, CAPILLARY
Glucose-Capillary: 50 mg/dL — ABNORMAL LOW (ref 70–99)
Glucose-Capillary: 118 mg/dL — ABNORMAL HIGH (ref 70–99)
Glucose-Capillary: 63 mg/dL — ABNORMAL LOW (ref 70–99)
Glucose-Capillary: 170 mg/dL — ABNORMAL HIGH (ref 70–99)
Glucose-Capillary: 113 mg/dL — ABNORMAL HIGH (ref 70–99)
Glucose-Capillary: 193 mg/dL — ABNORMAL HIGH (ref 70–99)

## 2020-09-22 LAB — ADAMTS13 ACTIVITY: Adamts 13 Activity: 56.6 % — ABNORMAL LOW (ref >66.8)

## 2020-09-22 LAB — BASIC METABOLIC PANEL
Anion gap: 6 (ref 5–15)
BUN: 14 mg/dL (ref 8–23)
CO2: 32 mmol/L (ref 22–32)
Calcium: 8.1 mg/dL — ABNORMAL LOW (ref 8.9–10.3)
Chloride: 96 mmol/L — ABNORMAL LOW (ref 98–111)
Creatinine, Ser: 1.06 mg/dL (ref 0.61–1.24)
GFR, Estimated: >60 mL/min (ref >60)
Glucose, Bld: 123 mg/dL — ABNORMAL HIGH (ref 70–99)
Potassium: 4.5 mmol/L (ref 3.5–5.1)
Sodium: 134 mmol/L — ABNORMAL LOW (ref 135–145)

## 2020-09-22 LAB — TSH: TSH: 2.42 u[IU]/mL (ref 0.350–4.500)

## 2020-09-22 LAB — ADAMTS13 ACTIVITY REFLEX

## 2020-09-22 LAB — APTT: aPTT: 43 seconds — ABNORMAL HIGH (ref 24–36)

## 2020-09-22 LAB — HAPTOGLOBIN: Haptoglobin: 237 mg/dL (ref 32–363)

## 2020-09-22 LAB — MAGNESIUM: Magnesium: 1.9 mg/dL (ref 1.7–2.4)

## 2020-09-22 MED ORDER — INSULIN GLARGINE 100 UNIT/ML ~~LOC~~ SOLN
15.0000 [IU] | Freq: Every day | SUBCUTANEOUS | Status: DC
  Administered 2020-09-22: 15 [IU] via SUBCUTANEOUS
  Filled 2020-09-22 (×2): qty 0.15

## 2020-09-22 MED ORDER — MAGNESIUM SULFATE 2 GM/50ML IV SOLN
2.0000 g | Freq: Once | INTRAVENOUS | Status: AC
  Administered 2020-09-22: 2 g via INTRAVENOUS
  Filled 2020-09-22: qty 50

## 2020-09-22 MED ORDER — FLUCONAZOLE 150 MG PO TABS
150.0000 mg | ORAL_TABLET | ORAL | Status: DC
  Administered 2020-09-22: 150 mg via ORAL
  Filled 2020-09-22: qty 1

## 2020-09-22 MED ORDER — INSULIN GLARGINE 100 UNIT/ML ~~LOC~~ SOLN
20.0000 [IU] | Freq: Every day | SUBCUTANEOUS | Status: DC
  Administered 2020-09-22 – 2020-09-25 (×4): 20 [IU] via SUBCUTANEOUS
  Filled 2020-09-22 (×4): qty 0.2

## 2020-09-22 NOTE — Progress Notes (Signed)
Patient is postop day 4 status post transtibial amputation on the right.  [name] laying in bed and appears comfortable.   Vital signs stable wound VAC is functioning with 0 cc in the canister it has 1 green check.    Status post above.  Patient has expressed a desire to be able to be discharged to home.  He does say he has plenty of help at home.  I told him from an orthopedic standpoint he needs to be cleared by physical therapy that this would be a viable option for him.  Would support him being discharged to appropriate place when medically cleared and cleared by vascular.  Would need 1 week follow-up in our office

## 2020-09-22 NOTE — Progress Notes (Signed)
Subjective:  Patient reports doing well. Reports urinated in bed ON as was unable to get urinal in time. Denies urinary frequency and urgency. Denies headache, chest pain, or abdominal pain.   Objective:  Vital signs in last 24 hours: Vitals:   09/21/20 1948 09/21/20 2304 09/22/20 0351 09/22/20 0936  BP: 113/70 (!) 109/46 (!) 117/46 (!) 122/51  Pulse: 75 64 67   Resp: 18 18 19 18   Temp: 98.5 F (36.9 C) 98.5 F (36.9 C) 97.7 F (36.5 C) 98.1 F (36.7 C)  TempSrc: Oral Oral Oral Oral  SpO2: 99% 99% 98% 95%  Weight:   72.4 kg   Height:       Weight change: 5.9 kg  Intake/Output Summary (Last 24 hours) at 09/22/2020 1304 Last data filed at 09/22/2020 09/24/2020 Gross per 24 hour  Intake 250 ml  Output 800 ml  Net -550 ml    Physical Exam Constitutional:      General: [name] not in acute distress.    Appearance: [name] not ill-appearing, toxic-appearing or diaphoretic.     Comments: Pleasant patient seen lying comfortably in bed. NAD  Cardiovascular:     Rate and Rhythm: Normal rate and regular rhythm.  Pulmonary:     Effort: Pulmonary effort is normal. No respiratory distress.  Abdominal:     General: Distension: mild.     Palpations: Abdomen is soft.     Tenderness: There is no abdominal tenderness.  Skin:    Comments: Groin incisions clean, dry, and intact. Moist, patchy erythema to groin, penile shaft, perineal and anal regions.   Neurological:     Mental Status: [name] alert.  Psychiatric:        Mood and Affect: Mood normal.        Behavior: Behavior normal.     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 Hospital course complicated by peri-operative new-onset atrial fibrillation/flutter with benzodiazepine withdrawal, now back in sinus rhythm. Developed heparin-induced thrombocytopenia, now on Fondaparinux. Overall patient stable but given acute drop in platelets will need to continue to monitor prior to discharge.   #Occlusive PVD RLE  #POD10 aortobifemoral bypass, right fem-pop bypass #POD4 R BKA Pain currently well-controlled. No signs of active bleeding, surgical scars clean, dry, in tact. Will continue PT/OT. Hopeful for CIR once stable, pending availability. Appreciate vascular, orthopedic assistance with this case.  - C/w morphine, percocet - C/w crestor, ASA - PT/OT - Dispo: CIR depending on availability -F/u with Orthopedics in 1 week -Outpatient stress test per Cardiology recs   #Heparin-induced thrombocytopenia, type 2 Platelets have stabilized at 47 k. No signs of active bleeding on exam. Hgb stable. Potentially nadir, will continue to monitor.   -Continue Fondaparinux 7.5 mg - Daily CBC   #Incidental Elevated D Dimer in Post-operative period Patient POD4 Right BKA. Found to have elevated D dimer  yesterday, in the context of comprehensive evaluation of thrombocytopenia as above. Remains stable, with good respiratory status. Remains on anticoagulation as above  -continued monitoring   #Latent autoimmune diabetes mellitus Better controlled, though notably hypoglycemic to 63 fasting.  -Continue Lantus 20 units in AM; decreased evening dose to 15 units - Resistant SSI - 3U of meal coverage - CBG goal <180   #Sinus bradycardia, resolved HR improved overnight to 60s-80s. BP improving with systolic in  110s-120's. No signs of atrial fibrillation on exam or tele. Will continue holding metoprolol and continue to assess daily. - Holding metoprolol - Tele   #Hyponatremia Corrected sodium 134-135. No acute changes in volume status.  - Daily BMP   #Tinea cruris Patient complaining of itching and burning in groin. Expansion of region of erythema to groin, penile shaft, perineal and anal region. Started on clotrimazole cream yesterday.   -Switched to p.o. diflucan 150mg  weekly x2 -Monitor for improvement per media image comparison -keep area dry    LOS: 14 days   , Medical Student 09/22/2020, 1:04 PM

## 2020-09-22 NOTE — Progress Notes (Signed)
Mobility Specialist - Progress Note   09/22/20 1349  Mobility  Activity Transferred:  Bed to chair  Level of Assistance Minimal assist, patient does 75% or more  Assistive Device Front wheel walker  Distance Ambulated (ft) 4 ft  Mobility Response Tolerated well  Mobility performed by Mobility specialist  $Mobility charge 1 Mobility   Pt min assist to sit up on edge of bed and to stand w/ bed height elevated. Contact guard while hopping w/ RW to the chair. Asx throughout, HR remained ~70 throughout.   Mamie Levers Mobility Specialist Mobility Specialist Phone: [phone]

## 2020-09-22 NOTE — Progress Notes (Signed)
Results for ABRAHM, MANCIA (MRN [id number]) as of 09/22/2020 07:49  Ref. Range 09/21/2020 23:09 09/22/2020 00:53 09/22/2020 06:14 09/22/2020 06:44 09/22/2020 07:26  Glucose-Capillary Latest Ref Range: 70 - 99 mg/dL 790 (H)  50 (L) 63 (L) 118 (H)    Hypoglycemic standing order initiated. Pt has good oral intake and able to drink orange juice and apple juice.  Will hand off to day shift team.  Kerrie Buffalo, RN

## 2020-09-22 NOTE — Progress Notes (Addendum)
  Progress Note    09/22/2020 7:25 AM 4 Days Post-Op  Subjective:  Says he did a lot of work with PT yesterday and did well.  Says they have been having to put ointment on his privates.  Says he's passing gas and had some loose bowel movements  Afebrile HR 50's-70's NSR 100's-110's systolic 98% RA  Vitals:   09/21/20 2304 09/22/20 0351  BP: (!) 109/46 (!) 117/46  Pulse: 64 67  Resp: 18 19  Temp: 98.5 F (36.9 C) 97.7 F (36.5 C)  SpO2: 99% 98%    Physical Exam: General:  No distress Lungs:  Non labored Incisions:  Minimal hematoma right proximal groin incision; small hematoma left groin-appears unchanged from marking.  Laparotomy incision is healing nicely. Extremities:  Right BKA with brace; left foot is warm and well perfused. Abdomen:  Mild distension but soft.   CBC    Component Value Date/Time   WBC 12.1 (H) 09/22/2020 0053   RBC 3.10 (L) 09/22/2020 0053   HGB 9.4 (L) 09/22/2020 0053   HCT 26.8 (L) 09/22/2020 0053   PLT 47 (L) 09/22/2020 0053   MCV 86.5 09/22/2020 0053   MCH 30.3 09/22/2020 0053   MCHC 35.1 09/22/2020 0053   RDW 16.9 (H) 09/22/2020 0053   LYMPHSABS 1.4 09/09/2020 0550   MONOABS 0.7 09/09/2020 0550   EOSABS 0.2 09/09/2020 0550   BASOSABS 0.0 09/09/2020 0550    BMET    Component Value Date/Time   NA 134 (L) 09/22/2020 0053   K 4.5 09/22/2020 0053   CL 96 (L) 09/22/2020 0053   CO2 32 09/22/2020 0053   GLUCOSE 123 (H) 09/22/2020 0053   BUN 14 09/22/2020 0053   CREATININE 1.06 09/22/2020 0053   CALCIUM 8.1 (L) 09/22/2020 0053   GFRNONAA >60 09/22/2020 0053    INR    Component Value Date/Time   INR 1.2 09/21/2020 0713     Intake/Output Summary (Last 24 hours) at 09/22/2020 0725 Last data filed at 09/22/2020 0351 Gross per 24 hour  Intake 250 ml  Output 850 ml  Net -600 ml     Assessment:  60 y.o. male is s/p:  #1 aortobifemoral bypass with 14 x 8 Hemashield graft, #2 right femoral to below-knee popliteal bypass with  translocated nonreversed right great saphenous vein 11Days Post-Op And Right BKA (Dr. Lajoyce Corners)  4 Days Post-Op   Plan: -pt tolerating diet-was having loose stools [place] post op, but he states this is better.   -minimal hematoma right groin and small hematoma left groin (unchanged from markings) - continue to monitor.  -pt maintaining sinus rhythm -DVT prophylaxis:  Arixtra given HIT+; platelets stable at 47k (up from 45k yesterday) -wound vac per Dr. Lajoyce Corners -hopeful for CIR; continue therapy   Doreatha Massed, PA-C [facility] [phone] 09/22/2020 7:25 AM   I have examined the patient, reviewed and agree with above.  Sitting up in chair.  Having pain in his right leg.  Some serous drainage from both groins.  Continue with physical therapy  Gretta Began, MD 09/22/2020 5:02 PM

## 2020-09-22 NOTE — Progress Notes (Signed)
Inpatient Rehab Admissions Coordinator:   Met with patient at bedside.  He still is unsure whether he would agree to CIR, and feels he has help at home from his 61 y/o mother and his significant other.  Note that he is requiring moderate assist for stand pivot transfers, and when we discussed that maybe w/c level was safer if he were going to d/c home, he agreed, but then later stated he felt it would be safe to hop to get to his recliner in the living room. Unsure of baseline cognition, but do note some deficits in safety awareness, problem solving, and STM.  Feel he is still appropriate for CIR at this time, and will continue to follow for potential admission pending bed availability and progress with therapy.    Shann Medal, PT, DPT Admissions Coordinator [phone] 09/22/20  11:15 AM

## 2020-09-22 NOTE — Progress Notes (Signed)
Mobility Specialist - Progress Note   09/22/20 1834  Mobility  Activity Transferred:  Chair to bed  Level of Assistance Minimal assist, patient does 75% or more  Assistive Device Front wheel walker  Distance Ambulated (ft) 4 ft  Mobility Response Tolerated well  Mobility performed by Mobility specialist  $Mobility charge 1 Mobility   Pt min assist to rock-to-stand from recliner and min assist for steadying while pivoting to bed. RN in room to give meds afterwards. VSS.   Peter Mendez Mobility Specialist Mobility Specialist Phone: [phone]

## 2020-09-22 NOTE — Progress Notes (Signed)
Patient with increased pain and redness in the groin, buttocks, and scrotum area. Surgical Incisions unchanged from initial assessment this AM. Physician aware of situation and has made changes to medications throughout the day.   -Estella Husk, RN

## 2020-09-22 NOTE — Telephone Encounter (Signed)
Patients sister Camelia Eng called she would like a call back from Dr.Duda regarding patients surgery call back:[phone]

## 2020-09-23 DIAGNOSIS — L89152 Pressure ulcer of sacral region, stage 2: Secondary | ICD-10-CM

## 2020-09-23 DIAGNOSIS — E13622 Other specified diabetes mellitus with other skin ulcer: Secondary | ICD-10-CM

## 2020-09-23 LAB — BASIC METABOLIC PANEL
Anion gap: 8 (ref 5–15)
BUN: 13 mg/dL (ref 8–23)
CO2: 27 mmol/L (ref 22–32)
Calcium: 7.9 mg/dL — ABNORMAL LOW (ref 8.9–10.3)
Chloride: 96 mmol/L — ABNORMAL LOW (ref 98–111)
Creatinine, Ser: 0.76 mg/dL (ref 0.61–1.24)
GFR, Estimated: >60 mL/min (ref >60)
Glucose, Bld: 239 mg/dL — ABNORMAL HIGH (ref 70–99)
Potassium: 4.5 mmol/L (ref 3.5–5.1)
Sodium: 131 mmol/L — ABNORMAL LOW (ref 135–145)

## 2020-09-23 LAB — GLUCOSE, CAPILLARY
Glucose-Capillary: 251 mg/dL — ABNORMAL HIGH (ref 70–99)
Glucose-Capillary: 58 mg/dL — ABNORMAL LOW (ref 70–99)
Glucose-Capillary: 98 mg/dL (ref 70–99)
Glucose-Capillary: 260 mg/dL — ABNORMAL HIGH (ref 70–99)
Glucose-Capillary: 126 mg/dL — ABNORMAL HIGH (ref 70–99)

## 2020-09-23 LAB — CBC
HCT: 23.5 % — ABNORMAL LOW (ref 39.0–52.0)
Hemoglobin: 8.3 g/dL — ABNORMAL LOW (ref 13.0–17.0)
MCH: 30.5 pg (ref 26.0–34.0)
MCHC: 35.3 g/dL (ref 30.0–36.0)
MCV: 86.4 fL (ref 80.0–100.0)
Platelets: 48 10*3/uL — ABNORMAL LOW (ref 150–400)
RBC: 2.72 MIL/uL — ABNORMAL LOW (ref 4.22–5.81)
RDW: 17.2 % — ABNORMAL HIGH (ref 11.5–15.5)
WBC: 10.7 10*3/uL — ABNORMAL HIGH (ref 4.0–10.5)
nRBC: 0 % (ref 0.0–0.2)

## 2020-09-23 LAB — SEROTONIN RELEASE ASSAY (SRA)
SRA .2 IU/mL UFH Ser-aCnc: 71 % — ABNORMAL HIGH (ref 0–20)
SRA 100IU/mL UFH Ser-aCnc: <1 % (ref 0–20)

## 2020-09-23 LAB — APTT: aPTT: 43 seconds — ABNORMAL HIGH (ref 24–36)

## 2020-09-23 LAB — MAGNESIUM: Magnesium: 2.1 mg/dL (ref 1.7–2.4)

## 2020-09-23 MED ORDER — RIVAROXABAN 15 MG PO TABS
15.0000 mg | ORAL_TABLET | Freq: Two times a day (BID) | ORAL | Status: DC
  Administered 2020-09-23 – 2020-09-25 (×4): 15 mg via ORAL
  Filled 2020-09-23 (×4): qty 1

## 2020-09-23 MED ORDER — INSULIN GLARGINE 100 UNIT/ML ~~LOC~~ SOLN
17.0000 [IU] | Freq: Every day | SUBCUTANEOUS | Status: DC
  Administered 2020-09-23 – 2020-09-24 (×2): 17 [IU] via SUBCUTANEOUS
  Filled 2020-09-23 (×3): qty 0.17

## 2020-09-23 NOTE — Progress Notes (Addendum)
Subjective:  Patient reports doing well. Conveys pain is well controlled; some breakthrough pain at ~5, well managed on current pain regimine. Endorses moving bowels. Patient reports less itching along the groin, and some reduction to sacral discomfort.   Objective:  Vital signs in last 24 hours: Vitals:   09/23/20 0004 09/23/20 0409 09/23/20 0541 09/23/20 0756  BP: 130/65 122/60  (!) 122/55  Pulse: 74 63  74  Resp: 18 18  16   Temp: 98 F (36.7 C) 98 F (36.7 C)  98.4 F (36.9 C)  TempSrc: Oral Oral  Oral  SpO2: 98% 100%  100%  Weight:   66.8 kg   Height:       Weight change: -5.6 kg  Intake/Output Summary (Last 24 hours) at 09/23/2020 1026 Last data filed at 09/23/2020 0600 Gross per 24 hour  Intake 240 ml  Output 2325 ml  Net -2085 ml     Physical Exam Constitutional:      General: [name] not in acute distress.    Appearance: [name] not ill-appearing, toxic-appearing or diaphoretic.     Comments: Patient seen lying in bed, comfortably. NAD  Cardiovascular:     Rate and Rhythm: Normal rate and regular rhythm.  Pulmonary:     Effort: Pulmonary effort is normal. No respiratory distress.  Skin:    Comments: Incisions clean, dry, and intact. Improvement to groinal erythema. Sacral skin breakdown stable comparative to prior exam.     Neurological:     Mental Status: [name] alert. Mental status is at baseline.  Psychiatric:        Mood and Affect: Mood normal.        Behavior: Behavior normal.      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 Hospital course complicated by peri-operative new-onset atrial fibrillation/flutter with benzodiazepine withdrawal, with subsequent return to sinus rhythm. Developed heparin-induced thrombocytopenia, now on Fondaparinux. Overall patient stable and platelets stable following reaching nadir.    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 No signs of active bleeding, surgical scars clean, dry, in tact. Will continue PT/OT. Hopeful for CIR once stable, pending availability. Appreciate vascular, orthopedic assistance with this case. Medically ready for discharge at this point.   - C/w morphine, percocet - C/w crestor, ASA - PT/OT - Dispo: CIR depending on availability (no bed available at this time) -F/u with Orthopedics in 1 week -Outpatient stress test per Cardiology recs   #Heparin-induced thrombocytopenia, type 2 Nadir reached; platelets stable at 48 k. No signs of active bleeding on exam. Will continue to monitor.    -Switch from Fondaparinux (received 5 days of this, ending yesterday) to Rivaroxaban 15 mg BID for 21 days starting today.  - Daily CBC   #Incidental Elevated D Dimer in Post-operative period  Patient POD5 Right BKA. Found to have elevated D dimer, in the context of comprehensive evaluation of thrombocytopenia as above. Remains stable, with good respiratory status. Remains on anticoagulation as above   -continued monitoring    #Latent autoimmune diabetes mellitus Hyperglycemic fasting on reduced PM Lantus dose yesterday -Continue Lantus 20 units in AM; increased evening dose to 17 units - Resistant SSI - 3U of meal coverage - CBG goal <180     #Sinus bradycardia, resolved HR in  60s-70s. BP improving with systolic in 110s-130's. No signs of atrial fibrillation on exam.    -Holding metoprolol     #Hyponatremia Corrected sodium 133-134. No acute changes in volume status.  - Daily BMP     #Tinea cruris Patient complaining of itching and burning in groin. Expansion of region of erythema to groin, penile shaft, perineal and anal region noted yesterday. Started diflucan yesterday. Improving.    -p.o. diflucan 150mg  weekly x2 weeks -Monitor for improvement  -keep area dry   #Stage 2 Sacral Decubitus Ulcer Keep area clean and dry. Patient developing mobility with PT & OT assistance.      LOS: 15 days   , Medical Student 09/23/2020, 10:26 AM

## 2020-09-23 NOTE — Progress Notes (Addendum)
  Progress Note    09/23/2020 8:23 AM 5 Days Post-Op  Subjective:  Says overall he feels like he is doing well. Passing gas. BMs more normal now. Rash improving less burning on privates, buttock and back   Vitals:   09/23/20 0409 09/23/20 0756  BP: 122/60 (!) 122/55  Pulse: 63 74  Resp: 18 16  Temp: 98 F (36.7 C) 98.4 F (36.9 C)  SpO2: 100% 100%   Physical Exam: Cardiac: regular Lungs: non labored Incisions:bilateral groin incisions are clean, dry and intact. Small hematoma of bilateral incisions but stable. Soft surrounding. No drainage. Laparotomy incision is healing nicely. Right thigh incision is healing well Extremities: right BKA in brace Wound VAC per Dr. Lajoyce Corners. Left foot is warm and well perfused. Palpable PT Abdomen: soft, non tender, non distended Neurologic: alert and oriented  CBC    Component Value Date/Time   WBC 10.7 (H) 09/23/2020 0116   RBC 2.72 (L) 09/23/2020 0116   HGB 8.3 (L) 09/23/2020 0116   HCT 23.5 (L) 09/23/2020 0116   PLT 48 (L) 09/23/2020 0116   MCV 86.4 09/23/2020 0116   MCH 30.5 09/23/2020 0116   MCHC 35.3 09/23/2020 0116   RDW 17.2 (H) 09/23/2020 0116   LYMPHSABS 1.4 09/09/2020 0550   MONOABS 0.7 09/09/2020 0550   EOSABS 0.2 09/09/2020 0550   BASOSABS 0.0 09/09/2020 0550    BMET    Component Value Date/Time   NA 131 (L) 09/23/2020 0116   K 4.5 09/23/2020 0116   CL 96 (L) 09/23/2020 0116   CO2 27 09/23/2020 0116   GLUCOSE 239 (H) 09/23/2020 0116   BUN 13 09/23/2020 0116   CREATININE 0.76 09/23/2020 0116   CALCIUM 7.9 (L) 09/23/2020 0116   GFRNONAA >60 09/23/2020 0116    INR    Component Value Date/Time   INR 1.2 09/21/2020 0713     Intake/Output Summary (Last 24 hours) at 09/23/2020 1324 Last data filed at 09/23/2020 0600 Gross per 24 hour  Intake 240 ml  Output 2775 ml  Net -2535 ml     Assessment/Plan:  60 y.o. male is s/p #1 aortobifemoral bypass with 14 x 8 Hemashield graft, #2 right femoral to below-knee  popliteal bypass with translocated nonreversed right great saphenous vein 12 Days Post-Op And Right BKA by Dr. Lajoyce Corners 5 Days Post-Op. Doing well overall. Incisions well appearing. Says pain is manageable. Tolerating diet. BM more normal now. Fluconazole seems to be helping with rash. Platelets stable. Wound VAC per Dr. Lajoyce Corners. Continue therapy. Plan is for CIR when bed available  DVT prophylaxis:  Arixtra given HIT   Dory Horn [facility] [phone] 09/23/2020 8:23 AM   I have examined the patient, reviewed and agree with above.  Doing extremely well walking with walker.  Wounds all healing  Gretta Began, MD 09/23/2020 11:21 AM

## 2020-09-23 NOTE — Progress Notes (Signed)
PT Cancellation Note  Patient Details Name: Peter Mendez. MRN: [id number] DOB: Apr 18, 1961   Cancelled Treatment:    Reason Eval/Treat Not Completed: (P) Patient declined, no reason specified;Other (comment) (pt defer due to abdominal discomfort after recent blood sugar issues and diarrhea episode, wants to rest in chair longer until getting back to bed. Will attempt again later in day per PT POC as schedule permits.)   Angus Palms 09/23/2020, 2:29 PM

## 2020-09-23 NOTE — Progress Notes (Addendum)
Physical Therapy Treatment Patient Details Name: Peter Mendez. MRN: [id number] DOB: [birth year] Today's Date: 09/23/2020    History of Present Illness 60 yo s/p aortobifem bypass graft, R fem pop bypass due to critical limb ischemia. 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 PMH PVD, RLE dry gangrene, neuropathy, autoimmune diabetes, tobacco use, HTN, vision impairment.    PT Comments    Pt received seated in chair, agreeable to limited session due to ongoing symptoms of nausea/abdominal bloating. Pt anxious and perseverating on medication dosing but agreeable to chair>bed transfer, pt needing up to [facility] for stability and multimodal cues for safety/sequencing and RW management. Pt impulsive to sit once EOB. Pt performed seated scooting and bed mobility with Supervision. Pt given HEP (link: HEP: [email or web address]  Access Code: [email or web address]), plan to review exercises and progress gait distance next session when he feels better. Pt continues to benefit from PT services to progress toward functional mobility goals. Continue to recommend CIR, pending progress.   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, fully blind L eye; wound vac Required Braces or Orthoses: Other Brace Other Brace: RLE limb guard Restrictions Weight Bearing Restrictions: Yes RLE Weight Bearing: Non weight bearing Other Position/Activity Restrictions: new BKA, residual limb protector    Mobility  Bed Mobility Overal bed mobility: Needs Assistance Bed Mobility: Sit to Supine       Sit to supine: Supervision   General bed mobility comments: Supervision for safety; Supervision for seated scooting toward HOB     Transfers Overall transfer level: Needs assistance Equipment used: Rolling walker (2 wheeled) Transfers: Sit to/from [employer] Sit to Stand: Min assist;+2 physical assistance Stand pivot transfers: Mod assist;+2 safety/equipment       General transfer comment: from chair to RW, minA to stabilize on standing and pivoted to bed using RW and modA, second person present for safety; pt impulsive to sit once standing at bedside and modA for stand>sit  Ambulation/Gait Ambulation/Gait assistance: Mod assist;Min assist Gait Distance (Feet): 3 Feet Assistive device: Rolling walker (2 wheeled) Gait Pattern/deviations: Leaning posteriorly;Step-to pattern     General Gait Details: pt needing some assist to manage RW, some mild posterior/lateral LOB but able to correct with modA support; limited standing tolerance due to nausea and unable to attempt further gait   Stairs             Wheelchair Mobility    Modified Rankin (Stroke Patients Only)       Balance Overall balance assessment: Needs assistance Sitting-balance support: Feet supported;Single extremity supported Sitting balance-Leahy Scale: Fair Sitting balance - Comments: no LOB for static sitting or seated scooting Postural control: Posterior lean Standing balance support: Bilateral upper extremity supported Standing balance-Leahy Scale: Poor Standing balance comment: reliant on RW and external assist                            Cognition Arousal/Alertness: Awake/alert Behavior During Therapy: Anxious Overall Cognitive Status: Impaired/Different from baseline Area of Impairment: Memory;Safety/judgement;Problem solving                   Current Attention Level: Selective;Sustained Memory: Decreased recall of precautions;Decreased short-term memory Following Commands: Follows one step commands with  increased time;Follows multi-step commands inconsistently Safety/Judgement:  Decreased awareness of safety   Problem Solving: Difficulty sequencing;Requires verbal cues General Comments: pt internally distracted and perseverating on insulin dosing, anxious throughout and tangential, needs cues for reorientation to task.      Exercises Other Exercises Other Exercises: Pt given HEP handout and will need to review next session, pt with visual deficit and will have difficulty reading on his own    General Comments        Pertinent Vitals/Pain Pain Assessment: Faces Faces Pain Scale: Hurts even more Pain Location: R residual limb, buttocks Pain Descriptors / Indicators: Grimacing;Guarding;Discomfort Pain Intervention(s): Monitored during session;Repositioned;Limited activity within patient's tolerance    Home Living                      Prior Function            PT Goals (current goals can now be found in the care plan section) Acute Rehab PT Goals Patient Stated Goal: ready to get back to bed PT Goal Formulation: With patient Time For Goal Achievement: 09/26/20 Potential to Achieve Goals: Good Progress towards PT goals: Progressing toward goals (limited session due to nausea)    Frequency    Min 3X/week      PT Plan Current plan remains appropriate    Co-evaluation              AM-PAC PT "6 Clicks" Mobility   Outcome Measure  Help needed turning from your back to your side while in a flat bed without using bedrails?: None Help needed moving from lying on your back to sitting on the side of a flat bed without using bedrails?: A Little Help needed moving to and from a bed to a chair (including a wheelchair)?: A Lot Help needed standing up from a chair using your arms (e.g., wheelchair or bedside chair)?: A Little Help needed to walk in hospital room?: A Lot Help needed climbing 3-5 steps with a railing? : Total 6 Click Score: 15    End of Session Equipment Utilized During Treatment: Gait belt Activity Tolerance: Patient  tolerated treatment well Patient left: in bed;with call bell/phone within reach;with bed alarm set Nurse Communication: Mobility status PT Visit Diagnosis: Unsteadiness on feet (R26.81);Muscle weakness (generalized) (M62.81);Pain Pain - Right/Left: Right Pain - part of body: Leg     Time: 1536-1550 PT Time Calculation (min) (ACUTE ONLY): 14 min  Charges:  $Therapeutic Activity: 8-22 mins                     Henri Guedes P., PTA Acute Rehabilitation Services Pager: [phone] Office: [phone]   [place] 09/23/2020, 4:23 PM

## 2020-09-23 NOTE — Telephone Encounter (Signed)
Please call pt's sister to discuss surgery.

## 2020-09-23 NOTE — Progress Notes (Signed)
Inpatient Rehab Admissions Coordinator:   I have no beds available for this patient to admit to CIR today.  Will continue to follow for timing of potential admission pending bed availability.   Estill Dooms, PT, DPT Admissions Coordinator [phone] 09/23/20  11:55 AM

## 2020-09-23 NOTE — Telephone Encounter (Signed)
I spoke with patient's sister discussed the importance of him participating with therapy in order to be a good candidate for inpatient rehabilitation.  As of today still awaiting bed availability for inpatient rehab.

## 2020-09-23 NOTE — Progress Notes (Signed)
Inpatient Diabetes Program Recommendations  AACE/ADA: New Consensus Statement on Inpatient Glycemic Control (2015)  Target Ranges:  Prepandial:   less than 140 mg/dL      Peak postprandial:   less than 180 mg/dL (1-2 hours)      Critically ill patients:  140 - 180 mg/dL   Lab Results  Component Value Date   GLUCAP 58 (L) 09/23/2020   HGBA1C 8.8 (A) 07/21/2020    Review of Glycemic Control  Diabetes history: Type 1 DM Outpatient Diabetes medications:  Current orders for Inpatient glycemic control: Lantus 20 units QD and 17 units QHS, Novolog 0-20 units TID with meals + 3 units TID with meals  HgbA1C - 8.8% Endo - Dr Lucianne Muss Hypoglycemia  Inpatient Diabetes Program Recommendations:     Decrease Novolog to 0-9 units TID with meals and 0-5 HS (pt is Type 1 and sensitive to insulin) Increase Novolog to 5 units TID with meals if eating > 50% meal  Will continue to follow.  Thank you. Ailene Ards, RD, LDN, CDE Inpatient Diabetes Coordinator [phone]

## 2020-09-24 DIAGNOSIS — D509 Iron deficiency anemia, unspecified: Secondary | ICD-10-CM | POA: Diagnosis present

## 2020-09-24 LAB — GLUCOSE, CAPILLARY
Glucose-Capillary: 168 mg/dL — ABNORMAL HIGH (ref 70–99)
Glucose-Capillary: 105 mg/dL — ABNORMAL HIGH (ref 70–99)
Glucose-Capillary: 139 mg/dL — ABNORMAL HIGH (ref 70–99)
Glucose-Capillary: 172 mg/dL — ABNORMAL HIGH (ref 70–99)
Glucose-Capillary: 127 mg/dL — ABNORMAL HIGH (ref 70–99)

## 2020-09-24 LAB — CBC
HCT: 22.1 % — ABNORMAL LOW (ref 39.0–52.0)
Hemoglobin: 7.6 g/dL — ABNORMAL LOW (ref 13.0–17.0)
MCH: 30.3 pg (ref 26.0–34.0)
MCHC: 34.4 g/dL (ref 30.0–36.0)
MCV: 88 fL (ref 80.0–100.0)
Platelets: 57 10*3/uL — ABNORMAL LOW (ref 150–400)
RBC: 2.51 MIL/uL — ABNORMAL LOW (ref 4.22–5.81)
RDW: 17.6 % — ABNORMAL HIGH (ref 11.5–15.5)
WBC: 9.9 10*3/uL (ref 4.0–10.5)
nRBC: 0 % (ref 0.0–0.2)

## 2020-09-24 LAB — IRON AND TIBC
Iron: 40 ug/dL — ABNORMAL LOW (ref 45–182)
Saturation Ratios: 17 % — ABNORMAL LOW (ref 17.9–39.5)
TIBC: 235 ug/dL — ABNORMAL LOW (ref 250–450)
UIBC: 195 ug/dL

## 2020-09-24 LAB — BASIC METABOLIC PANEL
Anion gap: 6 (ref 5–15)
BUN: 17 mg/dL (ref 8–23)
CO2: 30 mmol/L (ref 22–32)
Calcium: 8 mg/dL — ABNORMAL LOW (ref 8.9–10.3)
Chloride: 95 mmol/L — ABNORMAL LOW (ref 98–111)
Creatinine, Ser: 0.73 mg/dL (ref 0.61–1.24)
GFR, Estimated: >60 mL/min (ref >60)
Glucose, Bld: 167 mg/dL — ABNORMAL HIGH (ref 70–99)
Potassium: 4.3 mmol/L (ref 3.5–5.1)
Sodium: 131 mmol/L — ABNORMAL LOW (ref 135–145)

## 2020-09-24 LAB — APTT: aPTT: 54 seconds — ABNORMAL HIGH (ref 24–36)

## 2020-09-24 LAB — MAGNESIUM: Magnesium: 1.9 mg/dL (ref 1.7–2.4)

## 2020-09-24 LAB — FERRITIN: Ferritin: 155 ng/mL (ref 24–336)

## 2020-09-24 MED ORDER — INSULIN ASPART 100 UNIT/ML ~~LOC~~ SOLN
5.0000 [IU] | Freq: Three times a day (TID) | SUBCUTANEOUS | Status: DC
  Administered 2020-09-24 – 2020-09-25 (×2): 5 [IU] via SUBCUTANEOUS

## 2020-09-24 MED ORDER — INSULIN ASPART 100 UNIT/ML ~~LOC~~ SOLN
0.0000 [IU] | Freq: Three times a day (TID) | SUBCUTANEOUS | Status: DC
  Administered 2020-09-24: 2 [IU] via SUBCUTANEOUS
  Administered 2020-09-25: 1 [IU] via SUBCUTANEOUS

## 2020-09-24 MED ORDER — INSULIN ASPART 100 UNIT/ML ~~LOC~~ SOLN: 0.0000 [IU] | Freq: Every day | SUBCUTANEOUS | Status: DC

## 2020-09-24 NOTE — Progress Notes (Signed)
Subjective:  Patient evaluated at bedside this AM. He reports he is feeling well this morning, no acute issues. Continuing to work with PT/OT. Pain well-controlled. He says he talked to orthopedic surgery this AM, plans to d/c wound vac tomorrow. Denies any obvious bleeding, fevers, chills, chest pain, dyspnea, palpitations, abdominal pain, nausea, vomiting.  Objective:  Vital signs in last 24 hours: Vitals:   09/23/20 1631 09/23/20 1958 09/23/20 2312 09/24/20 0431  BP: 122/61 117/60 (!) 110/55 (!) 123/58  Pulse: 62 67 69 72  Resp: 16 16 18 17   Temp: 98 F (36.7 C) 98.1 F (36.7 C) 98 F (36.7 C) 99 F (37.2 C)  TempSrc: Oral Oral Oral Oral  SpO2: 100% 100% 100% 100%  Weight:      Height:       Physical Exam: General: Elderly appearing, sitting up in bed, no acute distress CV: Regular rate, rhythm. No murmurs, rubs, gallops. Pulm: Clear to auscultation bilaterally. No wheezing, rales, rhonchi.  Abdomen: Soft, non-tender, non-distended. Normoactive bowel sounds. Skin: Abdominal and groin incisions clean, dry, intact. Groin appears less erythematous than previous days. Neuro: Awake, alert, oriented x4. Moving extremities appropriately.  CBC Latest Ref Rng & Units 09/24/2020 09/23/2020 09/22/2020  WBC 4.0 - 10.5 K/uL 9.9 10.7(H) 12.1(H)  Hemoglobin 13.0 - 17.0 g/dL 7.6(L) 8.3(L) 9.4(L)  Hematocrit 39.0 - 52.0 % 22.1(L) 23.5(L) 26.8(L)  Platelets 150 - 400 K/uL 57(L) 48(L) 47(L)   BMP Latest Ref Rng & Units 09/24/2020 09/23/2020 09/22/2020  Glucose 70 - 99 mg/dL 09/24/2020) 270(J) 500(X)  BUN 8 - 23 mg/dL 17 13 14   Creatinine 0.61 - 1.24 mg/dL 381(W 2.99  Sodium 135 - 145 mmol/L 131(L) 131(L) 134(L)  Potassium 3.5 - 5.1 mmol/L 4.3 4.5 4.5  Chloride 98 - 111 mmol/L 95(L) 96(L) 96(L)  CO2 22 - 32 mmol/L 30 27 32  Calcium 8.9 - 10.3 mg/dL 8.0(L) 7.9(L) 8.1(L)   Assessment/Plan: Peter Mendez is 60yo person with peripheral vascular disease, latent autoimmune disease, tobacco use  disorder, hypertension admitted 1/31 with extensive arterial disease and cellulitis of RLE now s/p aortobifemoral and right fem-pop bypass 2/4 and 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 Patient's hospital course complicated by peri-operative new-onset atrial fibrillation/flutter associated with benzodiazepine withdrawal and subsequent spontaneous conversion to NSR, heparin-induced thrombocytopenia, and hyperglycemia. Patient is medically stable for discharge when CIR bed available.  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 Followed by orthopedic and vascular surgery with plans to remove wound vac tomorrow. Patient's pain well-controlled on current regimen. Continuing to encourage patient to work with physical therapy daily. [name] currently medically stable for discharge. - C/w percocet q4h PRN - C/w crestor, ASA - F/u outpatient ortho - PT/OT  #Heparin-induced thrombocytopenia, type 2 This AM PLT 57<47. Hgb stable. On exam no sign of bleeding or thrombosis. Discussed with pharmacy regarding anticoagulation. Planning for total 30d (end date: 10/17/20) of Xarelto. Discussed with patient, he verbalizes understanding. - C/w xarelto x30d - Daily CBC  #Latent  AI diabetes CBG's more appropriate overnight, AM CBG 168. Will adjust mealtime coverage based on diabetes program recommendations, appreciate assistance - Mealtime coverage, to be given within 15 minutes prior to each meal:  -5U Novolog  -Novolog 0-9 SSI - Nighttime  coverage:  - Novolog 0-5 SSI - Long-acting:  - Lantus 17U QHS  - Lantus 20U Q AM  #Tinea cruris Patient reports some improvement, will continue antifungla. - C/w fluconazole 150mg  weekly - Keep area dry  DIET: CM IVF: n/a DVT PPX: Xarelto BOWEL: Senokot-S CODE: FULL FAM COM: n/a  Prior to Admission Living Arrangement: Home Anticipated Discharge Location: CIR Barriers to Discharge: CIR placement Dispo: Anticipated discharge in approximately 1-3 day(s).   , MD 09/24/2020, 6:05 AM Pager: [phone] After 5pm on weekdays and 1pm on weekends: On Call pager [phone]

## 2020-09-24 NOTE — Care Management Important Message (Signed)
Important Message  Patient Details  Name: Peter Mendez. MRN: [id number] Date of Birth: [birth year]   Medicare Important Message Given:  Yes     Renie Ora 09/24/2020, 10:47 AM

## 2020-09-24 NOTE — Progress Notes (Signed)
Patient is postop day 6 status post below-knee amputation.  Overall he is doing well he said he did have some trouble with some hypoglycemia yesterday. Wound VAC is functioning with 1 check no drainage in the canister   Patient awaiting rehab bed.  Will remove wound VAC tomorrow

## 2020-09-24 NOTE — Progress Notes (Addendum)
  Progress Note    09/24/2020 7:49 AM 6 Days Post-Op  Subjective:  No complaints.  BM yesterday   Vitals:   09/23/20 2312 09/24/20 0431  BP: (!) 110/55 (!) 123/58  Pulse: 69 72  Resp: 18 17  Temp: 98 F (36.7 C) 99 F (37.2 C)  SpO2: 100% 100%   Physical Exam: Lungs:  Non labored Incisions:  abd and groin incisions c/d/i; RLE vein harvest incisions healing well Extremities:  L foot warm and well perfused Neurologic: A&O  CBC    Component Value Date/Time   WBC 9.9 09/24/2020 0108   RBC 2.51 (L) 09/24/2020 0108   HGB 7.6 (L) 09/24/2020 0108   HCT 22.1 (L) 09/24/2020 0108   PLT 57 (L) 09/24/2020 0108   MCV 88.0 09/24/2020 0108   MCH 30.3 09/24/2020 0108   MCHC 34.4 09/24/2020 0108   RDW 17.6 (H) 09/24/2020 0108   LYMPHSABS 1.4 09/09/2020 0550   MONOABS 0.7 09/09/2020 0550   EOSABS 0.2 09/09/2020 0550   BASOSABS 0.0 09/09/2020 0550    BMET    Component Value Date/Time   NA 131 (L) 09/24/2020 0108   K 4.3 09/24/2020 0108   CL 95 (L) 09/24/2020 0108   CO2 30 09/24/2020 0108   GLUCOSE 167 (H) 09/24/2020 0108   BUN 17 09/24/2020 0108   CREATININE 0.73 09/24/2020 0108   CALCIUM 8.0 (L) 09/24/2020 0108   GFRNONAA >60 09/24/2020 0108    INR    Component Value Date/Time   INR 1.2 09/21/2020 0713     Intake/Output Summary (Last 24 hours) at 09/24/2020 0749 Last data filed at 09/24/2020 0150 Gross per 24 hour  Intake 250 ml  Output 825 ml  Net -575 ml     Assessment/Plan:  60 y.o. male is s/p ABF bypass and R fem pop with subsequent R BKA 6 Days Post-Op   LLE well perfused ABd and groin incisions healing well R BKA per Dr. Kassie Mends regular diet; BM yesterday Ready for d/c to CIR when bed available   Emilie Rutter, PA-C [facility] [phone] 09/24/2020 7:49 AM  I have seen and evaluated the patient. I agree with the PA note as documented above.   Cephus Shelling, MD [facility]  [place] Office: [phone]

## 2020-09-24 NOTE — Progress Notes (Signed)
Mobility Specialist: Progress Note   09/24/20 1550  Mobility  Activity Ambulated in room  Level of Assistance Minimal assist, patient does 75% or more  Assistive Device Front wheel walker  Distance Ambulated (ft) 36 ft  Mobility Response Tolerated well  Mobility performed by Mobility specialist  Bed Position Chair  $Mobility charge 1 Mobility   Pre-Mobility: 73 HR, 96% SpO2 Post-Mobility: 72 HR, 97% SpO2  Pt hopped to the sink and sat down in chair to brush his teeth. Pt then hopped from the sink to the recliner. Pt has phone and call bell on table. Pt asx during ambulation. Pt required verbal cues for RW placement when hopping back to the chair, RN notified.   [facility] Jaylyn Iyer Mobility Specialist Mobility Specialist Phone: [phone]

## 2020-09-24 NOTE — Progress Notes (Signed)
Occupational Therapy Treatment Patient Details Name: Peter Mendez. MRN: [id number] DOB: March 31, 1961 Today's Date: 09/24/2020    History of present illness 60 yo s/p aortobifem bypass graft, R fem pop bypass due to critical limb ischemia. 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 PMH PVD, RLE dry gangrene, neuropathy, autoimmune diabetes, tobacco use, HTN, vision impairment.   OT comments  Patient had just been seen by Mobility Tech.  Patient described mobilizing to sink, brushing teeth and then mobilizing to recliner with RW and one assist.  OT discussed decision of CIR denial, and transitioning home with [facility] services.  DME needed: listed below.  Patient has a ramped entrance.  OT to continue to see in the acute setting to further assist with a safe transition to home with [facility] services.  Patient remains highly distractible and will need 24 hour assist/supervision.    Follow Up Recommendations  Supervision/Assistance - 24 hour;Home health OT    Equipment Recommendations  3 in 1 bedside commode;Wheelchair [phone] with bilateral swing away leg rests and removable desk arm length armrests);Wheelchair cushion [phone] foam gel); extended tub bench;slide board.    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  Transfers Overall transfer level: Needs assistance Equipment used: Rolling walker (2 wheeled) Transfers: Sit to/from [employer] Sit to Stand: Min assist Stand pivot transfers: Min assist            Balance           Standing balance support: Bilateral upper extremity supported Standing balance-Leahy Scale: Poor Standing balance  comment: reliant on RW and external assist                                                Cognition Arousal/Alertness: Awake/alert Behavior During Therapy: Anxious;Restless Overall Cognitive Status: Impaired/Different from baseline                           Safety/Judgement: Decreased awareness of safety   Problem Solving: Difficulty sequencing;Requires verbal cues                         Frequency  Min 2X/week        Progress Toward Goals  OT Goals(current goals can now be found in the care plan section)     Acute Rehab OT Goals Patient Stated Goal: preparing to go home with [facility] services. OT Goal Formulation: With patient Time For Goal Achievement: 10/03/20 Potential to Achieve Goals: Fair  Plan Discharge plan needs to be updated    Co-evaluation                 AM-PAC OT "6 Clicks" Daily Activity     Outcome Measure   Help from another person eating meals?: None Help from another person taking care of personal grooming?: A Little Help from another person toileting, which includes using toliet, bedpan, or urinal?: A Little Help from another person bathing (including  washing, rinsing, drying)?: A Lot Help from another person to put on and taking off regular upper body clothing?: None Help from another person to put on and taking off regular lower body clothing?: A Lot 6 Click Score: 18    End of Session Equipment Utilized During Treatment: Gait belt;Rolling walker  OT Visit Diagnosis: Unsteadiness on feet (R26.81)   Activity Tolerance Patient tolerated treatment well   Patient Left in chair;with call bell/phone within reach;with chair alarm set   Nurse Communication          Time: [phone] OT Time Calculation (min): 12 min  Charges: OT General Charges $OT Visit: 1 Visit OT Treatments $Therapeutic Activity: 8-22 mins  09/24/2020  Rich, OTR/L  Acute Rehabilitation Services  Office:   [phone]    Suzanna Obey 09/24/2020, 4:42 PM

## 2020-09-24 NOTE — TOC Initial Note (Signed)
Transition of Care (TOC) - Initial/Assessment Note    Patient Details  Name: Peter Mendez. MRN: [id number] Date of Birth: June 27, 1961  Transition of Care [facility]) CM/SW Contact:    Eduard Roux, LCSWA Phone Number: 09/24/2020, 10:28 AM  Clinical Narrative:                  CSW visit patient at bedside. CSW introduced self and explained role. CSW informed no available bed on inpatient rehab unit. CSW discussed short term rehab at SNF as back plan. Patient states if he is unable to go to inpatient rehab, want to discharge home w/home health. Patient states he has the support of his mother and significant other in the home. Patient states no preferred [facility] agency. Patient requested BSC and wheelchair. No transportation needs. RNCM updated.   Antony Blackbird, MSW, LCSW Clinical Social Worker   Expected Discharge Plan: Home w Home Health Services     Patient Goals and CMS Choice        Expected Discharge Plan and Services Expected Discharge Plan: Home w Home Health Services In-house Referral: Clinical Social Work                                            Prior Living Arrangements/Services   Lives with:: Self,Parents,Significant Other Patient language and need for interpreter reviewed:: No        Need for Family Participation in Patient Care: Yes (Comment) Care giver support system in place?: Yes (comment)   Criminal Activity/Legal Involvement Pertinent to Current Situation/Hospitalization: No - Comment as needed  Activities of Daily Living Home Assistive Devices/Equipment: Other (Comment) (walking stick for vision loss) ADL Screening (condition at time of admission) Patient's cognitive ability adequate to safely complete daily activities?: No Is the patient deaf or have difficulty hearing?: No Does the patient have difficulty seeing, even when wearing glasses/contacts?: Yes Does the patient have difficulty concentrating, remembering, or making decisions?:  No Patient able to express need for assistance with ADLs?: Yes Does the patient have difficulty dressing or bathing?: No Independently performs ADLs?: Yes (appropriate for developmental age) Does the patient have difficulty walking or climbing stairs?: Yes Weakness of Legs: Both Weakness of Arms/Hands: None  Permission Sought/Granted                  Emotional Assessment Appearance:: Appears stated age Attitude/Demeanor/Rapport: Ambitious,Self-Confident Affect (typically observed): Accepting,Appropriate,Pleasant Orientation: : Oriented to Self,Oriented to Place,Oriented to  Time,Oriented to Situation Alcohol / Substance Use: Not Applicable Psych Involvement: No (comment)  Admission diagnosis:  Lower limb ischemia [I99.8] Pain of right lower extremity [M79.604] Multiple wounds of skin [T14.8XXA] Patient Active Problem List   Diagnosis Date Noted  . Tinea cruris   . HIT (heparin-induced thrombocytopenia) (HCC)   . Pain of right lower extremity   . Atrial fibrillation (HCC)   . Typical atrial flutter (HCC)   . Pressure injury of skin 09/13/2020  . PAOD (peripheral arterial occlusive disease) (HCC)   . Multiple wounds of skin   . Lower limb ischemia 09/08/2020  . Gangrene of right foot (HCC)   . Chronic low back pain 04/27/2020  . 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 Chronic back pain 12/29/2015  . Generalized anxiety disorder 12/29/2015  . PVD (peripheral vascular disease) (HCC) 05/09/2014  . LADA (latent autoimmune diabetes mellitus  in adults) (HCC) 02/25/2013  . Hyponatremia 02/25/2013  . Essential hypertension 02/25/2013  . Pure hypercholesterolemia 02/25/2013   PCP:  Leilani Able, MD Pharmacy:   [facility] [phone] - [place], [place] - [address] AT [facility] OF [address] [place] [address] Phone: [phone] Fax: [phone]  [facility] Mail  Delivery - [address] [place] - [address] [place] [address] Phone: [phone] Fax: [phone]  [facility] - [place], [place] - [address] [place] [address] Phone: [phone] Fax: [phone]     Social Determinants of Health (SDOH) Interventions    Readmission Risk Interventions No flowsheet data found.

## 2020-09-24 NOTE — Progress Notes (Signed)
Inpatient Rehab Admissions Coordinator:   I still do not have a bed available for this patient.  Discussed with TOC to look at other venue options for post-acute rehab.  Antony Blackbird, LCSW, will f/u with patient.    Estill Dooms, PT, DPT Admissions Coordinator [phone] 09/24/20  9:51 AM

## 2020-09-25 ENCOUNTER — Inpatient Hospital Stay (HOSPITAL_COMMUNITY): Payer: Medicare Other

## 2020-09-25 DIAGNOSIS — R1011 Right upper quadrant pain: Secondary | ICD-10-CM

## 2020-09-25 DIAGNOSIS — R011 Cardiac murmur, unspecified: Secondary | ICD-10-CM | POA: Diagnosis not present

## 2020-09-25 LAB — ECHOCARDIOGRAM LIMITED
Height: 71 in
S' Lateral: 2.5 cm
Weight: 2165.8 oz

## 2020-09-25 LAB — CBC
HCT: 24.5 % — ABNORMAL LOW (ref 39.0–52.0)
Hemoglobin: 8.1 g/dL — ABNORMAL LOW (ref 13.0–17.0)
MCH: 29.9 pg (ref 26.0–34.0)
MCHC: 33.1 g/dL (ref 30.0–36.0)
MCV: 90.4 fL (ref 80.0–100.0)
Platelets: 59 10*3/uL — ABNORMAL LOW (ref 150–400)
RBC: 2.71 MIL/uL — ABNORMAL LOW (ref 4.22–5.81)
RDW: 18 % — ABNORMAL HIGH (ref 11.5–15.5)
WBC: 8.9 10*3/uL (ref 4.0–10.5)
nRBC: 0 % (ref 0.0–0.2)

## 2020-09-25 LAB — GLUCOSE, CAPILLARY
Glucose-Capillary: 63 mg/dL — ABNORMAL LOW (ref 70–99)
Glucose-Capillary: 78 mg/dL (ref 70–99)
Glucose-Capillary: 122 mg/dL — ABNORMAL HIGH (ref 70–99)
Glucose-Capillary: 101 mg/dL — ABNORMAL HIGH (ref 70–99)
Glucose-Capillary: 41 mg/dL — CL (ref 70–99)
Glucose-Capillary: 105 mg/dL — ABNORMAL HIGH (ref 70–99)

## 2020-09-25 LAB — BASIC METABOLIC PANEL
Anion gap: 7 (ref 5–15)
BUN: 15 mg/dL (ref 8–23)
CO2: 30 mmol/L (ref 22–32)
Calcium: 8.1 mg/dL — ABNORMAL LOW (ref 8.9–10.3)
Chloride: 95 mmol/L — ABNORMAL LOW (ref 98–111)
Creatinine, Ser: 0.82 mg/dL (ref 0.61–1.24)
GFR, Estimated: >60 mL/min (ref >60)
Glucose, Bld: 61 mg/dL — ABNORMAL LOW (ref 70–99)
Potassium: 4.2 mmol/L (ref 3.5–5.1)
Sodium: 132 mmol/L — ABNORMAL LOW (ref 135–145)

## 2020-09-25 MED ORDER — RIVAROXABAN 15 MG PO TABS: 15.0000 mg | ORAL_TABLET | Freq: Two times a day (BID) | ORAL | 0 refills | Status: DC

## 2020-09-25 MED ORDER — FLUCONAZOLE 150 MG PO TABS: 150.0000 mg | ORAL_TABLET | ORAL | 0 refills | Status: AC

## 2020-09-25 MED ORDER — RIVAROXABAN 20 MG PO TABS: 20.0000 mg | ORAL_TABLET | Freq: Every day | ORAL | 0 refills | Status: DC

## 2020-09-25 MED ORDER — OXYCODONE-ACETAMINOPHEN 5-325 MG PO TABS: 1.0000 | ORAL_TABLET | Freq: Three times a day (TID) | ORAL | 0 refills | Status: AC | PRN

## 2020-09-25 MED ORDER — SODIUM CHLORIDE 0.9 % IV SOLN
510.0000 mg | Freq: Once | INTRAVENOUS | Status: AC
  Administered 2020-09-25: 510 mg via INTRAVENOUS
  Filled 2020-09-25: qty 17

## 2020-09-25 MED ORDER — ROSUVASTATIN CALCIUM 20 MG PO TABS: 20.0000 mg | ORAL_TABLET | Freq: Every day | ORAL | 0 refills | Status: DC

## 2020-09-25 MED ORDER — ASPIRIN 81 MG PO TBEC: 81.0000 mg | DELAYED_RELEASE_TABLET | Freq: Every day | ORAL | 11 refills | Status: AC

## 2020-09-25 MED ORDER — LANTUS SOLOSTAR 100 UNIT/ML ~~LOC~~ SOPN: PEN_INJECTOR | SUBCUTANEOUS | 0 refills | Status: DC

## 2020-09-25 MED ORDER — INSULIN GLARGINE 100 UNIT/ML ~~LOC~~ SOLN
15.0000 [IU] | Freq: Every day | SUBCUTANEOUS | Status: DC
  Filled 2020-09-25: qty 0.15

## 2020-09-25 NOTE — Progress Notes (Signed)
  Echocardiogram 2D Echocardiogram has been performed.  Peter Mendez 09/25/2020, 2:11 PM

## 2020-09-25 NOTE — Progress Notes (Signed)
Mobility Specialist: Progress Note   09/25/20 1208  Mobility  Activity Ambulated in hall  Level of Assistance Contact guard assist, steadying assist  Assistive Device Front wheel walker  Distance Ambulated (ft) 124 ft  Mobility Response Tolerated well  Mobility performed by Mobility specialist  $Mobility charge 1 Mobility   Pt asx during ambulation. Pt required cues to slow down during ambulation due to unsteadiness with RW at times. Pt back to bed after walk.  [facility] Peter Mendez Mobility Specialist Mobility Specialist Phone: [phone]

## 2020-09-25 NOTE — Progress Notes (Signed)
Patient found not feeling well. BG was checked and found to be 41. Given two glasses of orange juice and a packet of crackers. BG was rechecked and found to be 101. Physician was paged and awaiting response. Patient left to rest, will continue to monitor.   -Estella Husk, RN

## 2020-09-25 NOTE — Progress Notes (Signed)
Inpatient Diabetes Program Recommendations  AACE/ADA: New Consensus Statement on Inpatient Glycemic Control (2015)  Target Ranges:  Prepandial:   less than 140 mg/dL      Peak postprandial:   less than 180 mg/dL (1-2 hours)      Critically ill patients:  140 - 180 mg/dL   Lab Results  Component Value Date   GLUCAP 122 (H) 09/25/2020   HGBA1C 8.8 (A) 07/21/2020    Review of Glycemic Control Results for Peter Mendez, Peter Mendez (MRN [id number]) as of 09/25/2020 12:40  Ref. Range 09/24/2020 06:21 09/24/2020 11:54 09/24/2020 13:35 09/24/2020 16:58 09/24/2020 21:39 09/25/2020 06:16 09/25/2020 06:43 09/25/2020 11:25  Glucose-Capillary Latest Ref Range: 70 - 99 mg/dL 852 (H) 778 (H) 242 (H) 172 (H) 127 (H) 63 (L) 78 122 (H)   Diabetes history: DM 2 Outpatient Diabetes medications: Lantus 34 AM/ 32 PM + Novolog 6-10 TID  Current orders for Inpatient glycemic control:  Lantus 20 units Daily, 17 units qpm Novolog 0-9 units tid + hs Novolog 5 units tid meal coverage  Inpatient Diabetes Program Recommendations:    Hypoglycemia for fasting glucose this am.  -  Decrease night time Lantus to 14 units.  Thanks,  Christena Deem RN, MSN, BC-ADM Inpatient Diabetes Coordinator Team Pager [phone] (8a-5p)

## 2020-09-25 NOTE — Progress Notes (Addendum)
   Progress Note    09/25/2020 7:47 AM 7 Days Post-Op  Subjective:  No major complaints. Overall says he feels he is doing well   Vitals:   09/24/20 2355 09/25/20 0530  BP: (!) 111/50 (!) 139/50  Pulse: 65 71  Resp:    Temp: (!) 96.8 F (36 C) 97.6 F (36.4 C)  SpO2: 99% 99%   Physical Exam: Cardiac:  Regular Lungs: non labored Incisions: laparotomy and bilateral groin incisions and right thigh incision clean, dry and intact. Healing well. Mild erythema of right groin incision. Bilateral groin incisions with scant amount of serous drainage. Right BKA with Wound VAC. Extremities:  2+ femoral pulses bilaterally, left foot warm and well perfused with DP/PT doppler signals Abdomen:  Soft, non distended Neurologic: alert and oriented  CBC    Component Value Date/Time   WBC 8.9 09/25/2020 0623   RBC 2.71 (L) 09/25/2020 0623   HGB 8.1 (L) 09/25/2020 0623   HCT 24.5 (L) 09/25/2020 0623   PLT 59 (L) 09/25/2020 0623   MCV 90.4 09/25/2020 0623   MCH 29.9 09/25/2020 0623   MCHC 33.1 09/25/2020 0623   RDW 18.0 (H) 09/25/2020 0623   LYMPHSABS 1.4 09/09/2020 0550   MONOABS 0.7 09/09/2020 0550   EOSABS 0.2 09/09/2020 0550   BASOSABS 0.0 09/09/2020 0550    BMET    Component Value Date/Time   NA 132 (L) 09/25/2020 0623   K 4.2 09/25/2020 0623   CL 95 (L) 09/25/2020 0623   CO2 30 09/25/2020 0623   GLUCOSE 61 (L) 09/25/2020 0623   BUN 15 09/25/2020 0623   CREATININE 0.82 09/25/2020 0623   CALCIUM 8.1 (L) 09/25/2020 0623   GFRNONAA >60 09/25/2020 0623    INR    Component Value Date/Time   INR 1.2 09/21/2020 0713     Intake/Output Summary (Last 24 hours) at 09/25/2020 0747 Last data filed at 09/25/2020 0056 Gross per 24 hour  Intake 237 ml  Output 1350 ml  Net -1113 ml     Assessment/Plan:  60 y.o. male is s/p ABF bypass and right fem- pop bypass with subsequent R BKA by Dr Lajoyce Corners 7 Days Post-Op. LLE well perfused and warm with doppler signals. Bilateral groin and  abdominal incisions are healing well. R BKA care by Dr. Lajoyce Corners. Tolerating diet. VSS. Afebrile. Hemodynamically stable. Plan is to go home with home health services now. No CIR bed availability. Has support at home. He will have follow up in 1 month with Dr. Arbie Cookey  DVT prophylaxis: Elenore Rota, PA-C [facility] [phone] 09/25/2020 7:47 AM   I have examined the patient, reviewed and agree with above.  Gretta Began, MD 09/25/2020 12:21 PM

## 2020-09-25 NOTE — Progress Notes (Signed)
Patient is postop day 7 status post below-knee amputation.  No further drainage in the canister VAC was removed well apposed wound edges swelling is very well controlled staples in place clean dry dressing was applied with shrinker over the top Will need 1 week follow-up in our office.  Should do daily dry dressing changes may area with antibacterial soap and water

## 2020-09-25 NOTE — Care Management (Signed)
1654 09-25-20 Case Manager received a call from the Charge RN on the unit, patient in need of transportation home. The patient's scheduled ride is in the hospital per patient. All durable medical equipment has arrived to the room. Case Manager provided the RN with the safe transport number to call once the patient is ready at [phone] and after 6:00 pm the RN may call [phone]. No further needs at this time. Graves-Bigelow, Lamar Laundry, RN, BSN Case Manager

## 2020-09-25 NOTE — Care Management (Addendum)
1241 09-25-20 Case Manager received a call that the pt has been preoperatively arranged with [facility] for home health services. The agency will begin start of care once discharged. Gala Lewandowsky, RN,BSN Case Manager   1251 -09-25-20 Case Manager called [employer] and they will deliver 3n1, Bedside Commode, and Wheelchair to be delivered to the room prior to transition home. Gala Lewandowsky, RN,BSN Case Manager

## 2020-09-25 NOTE — Discharge Summary (Signed)
Name: Peter Mendez. MRN: [id number] DOB: 07-Jun-1961 60 y.o. PCP: Leilani Able, MD  Date of Admission: 09/07/2020  5:41 PM Date of Discharge: 09/25/2020*Pending medical work-up Attending Physician: No att. providers found  Subjective:  Discharge Diagnosis: 1. Dry gangrene of right lower extremity 2/2 occlusive peripheral vascular disease 2. Cellulitis of right lower extremity 3. Heparin-induced thrombocytopenia 4. Atrial fibrillation/flutter in peri-operative setting and benzodiazepine withdrawal 5. Latent autoimmune diabetes 6. New systolic murmur 7. Delirium in hospital setting, benzodiazepine withdrawal 8. Euvolemic hyponatremia 9. Tinea cruris 10. Stage 2 sacral decubitus ulcer  Discharge Medications: Allergies as of 09/25/2020      Reactions   Heparin Other (See Comments)   Heparin (SRA positive)   Pravastatin Sodium Other (See Comments)   myalgia      Medication List    STOP taking these medications   oxyCODONE 15 MG immediate release tablet Commonly known as: ROXICODONE     TAKE these medications   acetaminophen 500 MG tablet Commonly known as: TYLENOL Take 1,000 mg by mouth every 6 (six) hours as needed for mild pain or headache.   alprazolam 2 MG tablet Commonly known as: XANAX take 1 tablet by mouth twice a day if needed for anxiety What changed:   how much to take  how to take this  when to take this  reasons to take this  additional instructions   aspirin 81 MG EC tablet Take 1 tablet (81 mg total) by mouth daily. Swallow whole.   Droplet Pen Needles 31G X 8 MM Misc Generic drug: Insulin Pen Needle USE TO INJECT  INSULIN FIVE TIMES DAILY   fluconazole 150 MG tablet Commonly known as: DIFLUCAN Take 1 tablet (150 mg total) by mouth once a week. Start taking on: September 29, 2020   FreeStyle Libre 14 Day Sensor Misc 1 Units by Does not apply route every 14 (fourteen) days.   gabapentin 300 MG capsule Commonly known as: NEURONTIN Take  1 capsule (300 mg total) by mouth 3 (three) times daily.   Lantus SoloStar 100 UNIT/ML Solostar Pen Generic drug: insulin glargine INJECT 15 UNITS SUBCUTANEOUSLY IN THE MORNING AND 15 UNITS IN THE EVENING. What changed: additional instructions   NovoLOG FlexPen 100 UNIT/ML FlexPen Generic drug: insulin aspart INJECT 6 TO 10 UNITS BEFORE MEALS AS DIRECTED  (PEN EXPIRES 28 DAYS AFTER OPENED) What changed: See the new instructions.   omeprazole 20 MG capsule Commonly known as: PRILOSEC Take 20 mg by mouth daily.   onetouch ultrasoft lancets Use as instructed to check blood sugars 7 times per day   oxyCODONE-acetaminophen 5-325 MG tablet Commonly known as: PERCOCET/ROXICET Take 1 tablet by mouth every 8 (eight) hours as needed for up to 5 days for moderate pain.   ramipril 5 MG capsule Commonly known as: ALTACE TAKE 1 CAPSULE EVERY DAY   Rivaroxaban 15 MG Tabs tablet Commonly known as: XARELTO Take 1 tablet (15 mg total) by mouth 2 (two) times daily with a meal for 18 days.   rivaroxaban 20 MG Tabs tablet Commonly known as: XARELTO Take 1 tablet (20 mg total) by mouth daily with supper for 7 days. Start taking on: October 14, 2020   rosuvastatin 20 MG tablet Commonly known as: CRESTOR Take 1 tablet (20 mg total) by mouth daily. What changed:   medication strength  how much to take            Durable Medical Equipment  (From admission, onward)  Start     Ordered   09/24/20 1657  For home use only DME Walker rolling  Once       Question Answer Comment  Walker: With 5 Inch Wheels   Patient needs a walker to treat with the following condition Hx of right BKA (HCC)      09/24/20 1656   09/24/20 1656  For home use only DME wheelchair cushion (seat and back)  Once        09/24/20 1656   09/24/20 1655  For home use only DME standard manual wheelchair with seat cushion  Once       Comments: Patient suffers from right below the knee amputation which impairs  their ability to perform daily activities like bathing, dressing, feeding, grooming, and toileting in the home.  A cane, crutch, or walker will not resolve issue with performing activities of daily living. A wheelchair will allow patient to safely perform daily activities. Patient can safely propel the wheelchair in the home or has a caregiver who can provide assistance. Length of need Lifetime. Accessories: elevating leg rests (ELRs), wheel locks, extensions and anti-tippers.   09/24/20 1656   09/24/20 1653  For home use only DME 3 n 1  Once        09/24/20 1656           Discharge Care Instructions  (From admission, onward)         Start     Ordered   09/25/20 0000  Discharge wound care:       Comments: -Keep buttocks clean, dry. -May clean right stump with antibacterial soap and water and apply clean dry dressing with shrinker over top.   09/25/20 1642          Disposition and follow-up:   Peter Mendez. was [facility] in Stable condition.  At the hospital follow up visit please address:  1. 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 Wound vac removed prior to discharge. Patient to follow-up with orthopedic surgery in one week and discharged with Home Health PT/OT.  2. Cellulitis of right lower extremity: On arrival patient with cellulitis of RLE, treated with IV antibiotics. Will need to continued wound care and close glycemic control to prevent further episodes of cellulitis.  3. 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 PLT decreased to 48, improving upon. Current recommendations are to complete 4 weeks to 3 months of non-heparin anticoagulation OR until platelet count has returned to normal, whichever happens first. He will complete a total 21 days of xarelto 15mg  twice daily, followed  by 20mg  daily.Please repeat a CBC at time of follow up to reassess need for ongoing anticoagulation.  4. Latent autoimmune diabetes: Decreased home lantus to 15U BID do to recurrent issues with hypoglycemia. Continued home mealtime novolog. Suspect he may not need the lantus doses he was taking prior to admission due to a change in activity level. Please assess blood sugars at time of follow up.  5. Euvolemic hyponatremia: Appears chronic, most likely due to malnutrition. Urine studies appropriate. Continue to monitor.  6. Tinea cruris: Patient developed tinea cruris in groin, buttocks while inpatient. Patient to complete course of fluconazole and follow-up with PCP.  7. Stage 2 sacral decubitus ulcer: Please continue to monitor.  Labs / imaging needed at time of follow-up: A1c, CBC, BMP  Pending labs/ test needing  follow-up: n/a  Follow-up Appointments:  Follow-up Information    [facility], [place], [place] In 1 week.   Specialty: Orthopedic Surgery Contact information: [address] [place] [address] [phone]        Larina Earthly, MD Follow up in 1 month(s).   Specialties: Vascular Surgery, Cardiology Why: The office will contact the patient with appoinment Contact information: [address] [place] [address] [phone]        Leilani Able, MD. Schedule an appointment as soon as possible for a visit in 1 week(s).   Specialty: Family Medicine Contact information: [address] [place] [address] [phone]        Jake Bathe, MD. Schedule an appointment as soon as possible for a visit in 2 week(s).   Specialty: Cardiology Contact information: [address] [place] [address] [phone]        [facility] Follow up.   Specialty: Home Health Services Why:  Pt is preoperatively arranged with [facility] for [facility].  Contact information: [address] [place] [address] [phone]         [facility] Follow up.   Why: Bedside commode, wheelchair, rolling walker to be delivered to the room prior to discharge home.  Contact information: [address] [place] [address] [phone]              Hospital Course by problem list: 1. Dry gangrene of right lower extremity 2/2 occlusive peripheral vascular disease (resolved) 2. Right lower extremity cellulitis (resolved)  Pt presented to [facility] ED on 09/08/20 for evaluation of right lower extremity pain and wounds that had progressed over the past month. There was evidence of cellulitis of the right foot in addition to dry gangrene. He underwent antibiotic treatment with vancomycin and rocephin. Blood cultures from admission had no growth.  CT with runoff revealed occluded infrarenal abdominal aorta and bilateral common and external iliac arteries and high grade stenosis at the junction of the inferior epigastric arteries and common femoral arteries bilaterally. It also revealed moderate-severe stenosis of the hepatic artery.  Due to the extensive nature of occlusive arterial disease, vascular surgery was consulted and after further discussion with orthopedics, decided to start with revascularization prior to surgical management of the foot.  He underwent aorta bifemoral bypass grafting and  right femoral popliteal bypass grafting on 09/11/20 and was transferred to ICU for post operative monitoring. He remained stable for transfer out of the ICU 2/7.   Due to the gangrenous nature of the right foot,  he underwent a right BKA which took place with Dr. Lajoyce Corners on 2/11 without complications. Physical and occupational therapy recommended discharge to CIR or SNF for rehabilitation however, due to a delay in bed availability and pt declining SNF, he elected to be discharged home on 2/18.   3. Atrial fibrillation/flutter in peri-operative setting. (2/7-2/10, resolved) 4. Heparin-induced thrombocytopenia (active,  recovering)  He was found to be in atrial flutter with RVR and a 2:1 block on 2/7, two days after revascularization. Echocardiogram from 2/2 showed evidence of a prior anteroseptal MI but otherwise had no significant structural abnormalities. He was started on IV amiodarone.   Eliquis was initially ordered for anticoagulation and he received one dose however, later that afternoon, the BKA was scheduled to be performed in 2 days, therefore precluding further DOAC use. He was therefore placed on a heparin drip.   Due to the BKA being scheduled 2  days later, he was unable to be placed on eliquis for anticoagulation due to its washout perioid and was therefore placed on a heparin drip. He converted back to sinus rhythm on 2/10 and amiodarone was discontinued.   On 2/10, his platelets had dropped >50% from admission (240>>111) , which raised our concern for HIT. Due to this, heparin was discontinued and HIT antibody testing was obtained. He was transitioned to fondaparinux. The antibody testing returned positive and the SRA was elevated to 71%, confirming the diagnosis of HIT. After 3-4 days of discontinuing heparin, his platelet count stopped dropping and plateaued in the upper 40s.   He was transitioned to xarelto on 2/16 in anticipation of hospital discharge. His platelet count began to trend up in the days prior to discharge and was 59 on the day of discharge.   5. Hospital delirium superimposed on benzodiazepine withdrawal He experienced a brief period of confusion after the initial surgery which was likely due to ICU delirium and benzodiazepine withdrawal (home xanax held due to surgery). After resuming the xanax and being transferred out of the ICU, this resolved.   5. Latent autoimmune diabetes Glycemic control was complicated by his acute illness and glucoses fluctuated greatly throughout his hospitalization despite multiple adjustments. He implied a good understanding of diabetes management  and was optimistic that it would improve when he returned to his home lifestyle.   7. Tinea cruris. Responded well to oral fluconazole and had improved by time of discharge.   8. Stage 2 sacral decubitus ulcer. He underwent dressing changes throughout the hospitalization.   9. Right adrenal nodule. Adrenal hyperplasia. Incidentally noted on CT runoff scan.  10. New systolic murmur: Found to have new 2/6 systolic murmur on 2/18. Asymptomatic, ECG unchanged. A repeat echocardiogram, on day of discharge, was unchanged from 2/2 with no significant valvular disease. Suspect this murmur may be a benign transient flow murmur.  Discharge Vitals:   BP (!) 157/72 (BP Location: Right Arm)   Pulse 74   Temp 97.9 F (36.6 C) (Oral)   Resp 18   Ht  (1.803 m)   Wt 61.4 kg   SpO2 100%   BMI 18.88 kg/m    Pertinent Labs, Studies, and Procedures:  CBC Latest Ref Rng & Units 09/25/2020 09/24/2020 09/23/2020  WBC 4.0 - 10.5 K/uL 8.9 9.9 10.7(H)  Hemoglobin 13.0 - 17.0 g/dL 8.1(L) 7.6(L) 8.3(L)  Hematocrit 39.0 - 52.0 % 24.5(L) 22.1(L) 23.5(L)  Platelets 150 - 400 K/uL 59(L) 57(L) 48(L)   BMP Latest Ref Rng & Units 09/25/2020 09/24/2020 09/23/2020  Glucose 70 - 99 mg/dL 16(X) 096(E) 454(U)  BUN 8 - 23 mg/dL Creatinine 0.61 - 1.24 mg/dL 9.81 1.91 4.78  Sodium 135 - 145 mmol/L 132(L) 131(L) 131(L)  Potassium 3.5 - 5.1 mmol/L 4.2 4.3 4.5  Chloride 98 - 111 mmol/L 95(L) 95(L) 96(L)  CO2 22 - 32 mmol/L Calcium 8.9 - 10.3 mg/dL 8.1(L) 8.0(L) 7.9(L)   Discharge Instructions: Discharge Instructions    Apply dressing   Complete by: As directed    May cleanse the stump with antibacterial soap and water apply clean dry dressing with shrinker over top   Call MD for:  difficulty breathing, headache or visual disturbances   Complete by: As directed    Call MD for:  extreme fatigue   Complete by: As directed    Call MD for:  hives   Complete by: As directed  Call MD for:   persistant dizziness or light-headedness   Complete by: As directed    Call MD for:  persistant nausea and vomiting   Complete by: As directed    Call MD for:  redness, tenderness, or signs of infection (pain, swelling, redness, odor or green/yellow discharge around incision site)   Complete by: As directed    Call MD for:  severe uncontrolled pain   Complete by: As directed    Call MD for:  temperature >100.4   Complete by: As directed    Diet - low sodium heart healthy   Complete by: As directed    Discharge instructions   Complete by: As directed    Peter Mendez, I am so glad you are feeling better! You were admitted with an infection in your foot, and found to have poor blood flow to the foot. You underwent a procedure to get blood flow to the area, but your foot was not perfusing well. Therefore, you had a right below-the-knee amputation on 2/11. You are now medically stable for discharge! Please see the following notes:  -Make sure to follow up with your primary care doctor, vascular surgeon, orthopedic surgeon, and cardiologist as listed.  -During your time in the hospital, it was found you have an allergy to heparin that caused your platelets to be low. The low platelets can increase your risk for bleeding, and the allergy can cause blood clots. Thankfully, your platelets started to increase in the past few days. We would like for you to continue with a blood thinner after discharge. You should continue taking the blood thinner until October 18, 2020. The blood thinner is called rivaroxaban (Xarelto) 15mg , twice daily.  -In addition, it will be important to have your sugars under control. This will allow your wound to heal correctly. Please follow-up with your primary care doctor within the next week.  -We have also been treating your yeast infection in your groin and buttocks. We started you on an anti-fungal medication called fluconazole. You have two doses left of this medication. You  should take one dose on Tuesday, February 22, and the other on Tuesday, March 1st.   It was a pleasure meeting you, Peter Mendez. I wish you the best and hope you stay happy and healthy!  Thank you, Evlyn KannerPhillip Braswell, MD   Discharge wound care:   Complete by: As directed    -Keep buttocks clean, dry. -May clean right stump with antibacterial soap and water and apply clean dry dressing with shrinker over top.   Increase activity slowly   Complete by: As directed      Signed: Elige Radonylee Jerriann Schrom, MD Internal Medicine Resident PGY-2 [facility] Pager: [phone] 09/25/2020 6:25 PM

## 2020-09-25 NOTE — Care Management (Addendum)
    Durable Medical Equipment  (From admission, onward)         Start     Ordered   09/24/20 1657  For home use only DME Walker rolling  Once       Question Answer Comment  Walker: With 5 Inch Wheels   Patient needs a walker to treat with the following condition Hx of right BKA (HCC)      09/24/20 1656   09/24/20 1656  For home use only DME wheelchair cushion (seat and back)  Once        09/24/20 1656   09/24/20 1655  For home use only DME standard manual wheelchair with seat cushion  Once       Comments: Patient suffers from right below the knee amputation which impairs their ability to perform daily activities like bathing, dressing, feeding, grooming, and toileting in the home.  A cane, crutch, or walker will not resolve issue with performing activities of daily living. A wheelchair will allow patient to safely perform daily activities. Patient can safely propel the wheelchair in the home or has a caregiver who can provide assistance. Length of need Lifetime. Accessories: elevating leg rests (ELRs), wheel locks, extensions and anti-tippers.   09/24/20 1656   09/24/20 1653  For home use only DME 3 n 1  Once        09/24/20 1656

## 2020-09-25 NOTE — Progress Notes (Addendum)
PT Cancellation Note  Patient Details Name: Peter Mendez. MRN: [id number] DOB: 06-Dec-1960   Cancelled Treatment:    Reason Eval/Treat Not Completed: (P) Medical issues which prohibited therapy RN defer due to pt hypoglycemic (42) after having insulin earlier and pt feeling unwell still, although level has now improved. Will continue efforts per PT POC as schedule permits.   Dorathy Kinsman Peter Mendez 09/25/2020, 3:40 PM

## 2020-09-25 NOTE — Progress Notes (Signed)
Inpatient Rehab Admissions Coordinator:   Still no beds available on CIR.  Note plans for d/c home.  Will sign off at this time.   Estill Dooms, PT, DPT Admissions Coordinator [phone] 09/25/20  11:02 AM

## 2020-09-25 NOTE — Progress Notes (Signed)
Subjective:  Patient evaluated at bedside this AM. Reports he's feeling good, no acute changes. Denies chest pain, palpitations, fevers, chills, abdominal pain, nausea, vomiting.   Objective:  Vital signs in last 24 hours: Vitals:   09/24/20 1651 09/24/20 2044 09/24/20 2355 09/25/20 0530  BP: (!) 114/55 (!) 115/47 (!) 111/50 (!) 139/50  Pulse: 65 60 65 71  Resp:      Temp: 97.7 F (36.5 C) 98.1 F (36.7 C) (!) 96.8 F (36 C) 97.6 F (36.4 C)  TempSrc: Oral Oral Axillary Oral  SpO2: 98% 100% 99% 99%  Weight:    61.4 kg  Height:       Physical Exam: General: Elderly appearing, no acute distress, laying in bed CV: Regular rate, rhythm. 2/6 systolic murmur appreciated Pulm: Clear to auscultation bilaterally. No wheezing, rhonchi, rales. Abdomen: Soft, non-tender, non-distended. Skin: Warm, dry. Groin and abdominal incisions clean, dry, in tact. Decreased erythema in groin, buttocks.  CBC Latest Ref Rng & Units 09/25/2020 09/24/2020 09/23/2020  WBC 4.0 - 10.5 K/uL 8.9 9.9 10.7(H)  Hemoglobin 13.0 - 17.0 g/dL 8.1(L) 7.6(L) 8.3(L)  Hematocrit 39.0 - 52.0 % 24.5(L) 22.1(L) 23.5(L)  Platelets 150 - 400 K/uL 59(L) 57(L) 48(L)   BMP Latest Ref Rng & Units 09/25/2020 09/24/2020 09/23/2020  Glucose 70 - 99 mg/dL 74(Q) 595(G) 387(F)  BUN 8 - 23 mg/dL 15 17 13   Creatinine 0.61 - 1.24 mg/dL 6.43 3.29  Sodium 135 - 145 mmol/L 132(L) 131(L) 131(L)  Potassium 3.5 - 5.1 mmol/L 4.2 4.3 4.5  Chloride 98 - 111 mmol/L 95(L) 95(L) 96(L)  CO2 22 - 32 mmol/L 30 30 27   Calcium 8.9 - 10.3 mg/dL 8.1(L) 8.0(L) 7.9(L)   Assessment/Plan: Peter Mendez is 60yo person with peripheral vascular disease, latent autoimmune disease, tobacco use disorder, hypertension admitted 1/31 with extensive arterial disease and cellulitis RLE now s/p aortobifemoral and right fem-pop bypass 2/4 and 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 Patient's hospital course complicated by peri-operative new-onset atrial fibrillation/flutter associated  with benzodiazepine withdrawal and subsequent spontaneous conversion to NSR, heparin-induced thrombocytopenia, and hyperglycemia. Patient has new systolic murmur on exam today, will work-up this afternoon, and if unremarkable patient will be stable for discharge.   Active Problems:   LADA (latent autoimmune diabetes mellitus in adults) (HCC)   Hyponatremia   PVD (peripheral vascular disease) (HCC)   Gangrene of right foot (HCC)   Lower limb ischemia   Multiple wounds of skin   PAOD (peripheral arterial occlusive disease) (HCC)   Pressure injury of skin   Typical atrial flutter (HCC)   Atrial fibrillation (HCC)   Pain of right lower extremity   HIT (heparin-induced thrombocytopenia) (HCC)   Tinea cruris   IDA (iron deficiency anemia)  #Occlusive peripheral vascular disease RLE #POD13 aortobifemoral bypass, right fem-pop bypass #POD7 R BKA Stable. Orthopedic surgery removed wound vac this morning. Pain well-controlled on current regimen and working with PT/OT. Patient will follow-up with orthopedic surgery and vascular surgery once discharged. He states he has family to help at home. - C/w percocet q4h PRN - C/w crestor, ASA - F/u outpatient ortho - PT/OT  #New systolic murmur On exam this morning, patient has 2/6 systolic murmur, which appears to be new. No murmur heard since taking over care of Mr. Creasy. He reports no known hx of murmur. Per chart review, no record of murmur during this hospitalization. There is a single note from 2012 that reveals 2/6 systolic murmur. Otherwise, TTE from earlier this admission with EF  50-55%, no stenosis or regurgitation noted. Patient denies chest pain, palpitations, dyspnea. ECG obtained, normal sinus rhythm, LAD present. Non-specific ST abnormalities, overall similar to previous study on 09/16/20. Will obtain TTE to further evaluate. - F/u TTE - If develops chest pain, troponins, r/p ECG  #Heparin-induced thrombocytopenia, type 2 PLT stable, no  signs of bleeding or thrombosis. Will continue to plan for total of 30d anticoagulation with xarelto (end date 10/30/20).  - C/w xarelto qd - Daily CBC  #Latent AI diabetes Patient with hypoglycemic episode this AM. Sugars difficult to control throughout hospitalization. Will decrease nighttime Lantus. - Mealtime coverage, to be given within prior to each meal:  -5U Novolog  -Novolog 0-9 SSI - QHS coverage:  - Novolog 0-5 SSI - Long-acting:  - Decrease Lantus 15U QHS  - Lantus 20 Q AM  #Tinea cruris Appears improved on exam. Will continue with weekly antifungal x2 more weeks. - C/w fluconazole 150mg  weekly (two more doses, 2/22, 3/1) - Keep area dry  DIET: CM IVF: n/a DVT PPX: Xarelto BOWEL: Senokot-S CODE: FULL FAM COM: n/a  Prior to Admission Living Arrangement: Home Anticipated Discharge Location: Home w/ HH, supervision 24/7 Barriers to Discharge: medical work-up Dispo: Anticipated discharge in approximately 1 day(s).   3/22, MD 09/25/2020, 6:02 AM Pager: [phone] After 5pm on weekdays and 1pm on weekends: On Call pager [phone]

## 2020-09-28 ENCOUNTER — Telehealth: Payer: Self-pay

## 2020-09-28 NOTE — Telephone Encounter (Signed)
Pt called to cancel PV appointment with Dr. Allyson Sabal. Pt is agreeable to this but also has questions about medications that he was prescribed at discharge.  Pt asking about his xarelto.  Per pt he believes that he should be taking this medication 3 times a day, 15mg  twice, once with breakfast and one with lunch and then a 20mg  with supper.  Per d/c instructions:   Rivaroxaban 15 MG Tabs tablet Commonly known as: XARELTO Take 1 tablet (15 mg total) by mouth 2 (two) times daily with a meal for 18 days.   rivaroxaban 20 MG Tabs tablet Commonly known as: XARELTO Take 1 tablet (20 mg total) by mouth daily with supper for 7 days. Start taking on: October 14, 2020    Explained these instructions to the pt. Pt states that he has an appointment to see Dr. on Friday as a follow up. Instructed pt to review all medications with Dr. Lajoyce Corners, including all questions he has.  Pt verbalizes understanding and repeats back to me the instructions given to him regarding xarelto.

## 2020-09-29 ENCOUNTER — Telehealth: Payer: Self-pay

## 2020-09-29 ENCOUNTER — Ambulatory Visit: Payer: Medicare Other | Admitting: Cardiovascular Disease

## 2020-09-29 NOTE — Telephone Encounter (Signed)
Will from [facility] called he is requesting verbal orders with a frequency of 2w2w and 1w2w Call back:[phone] Fax:[phone]

## 2020-09-30 NOTE — Telephone Encounter (Signed)
Called and sw Will to advise verbal ok for orders as below. Pt is s/p a BKA

## 2020-10-01 ENCOUNTER — Ambulatory Visit (INDEPENDENT_AMBULATORY_CARE_PROVIDER_SITE_OTHER): Payer: Medicare Other | Admitting: Orthopedic Surgery

## 2020-10-01 DIAGNOSIS — I739 Peripheral vascular disease, unspecified: Secondary | ICD-10-CM

## 2020-10-01 DIAGNOSIS — S88111A Complete traumatic amputation at level between knee and ankle, right lower leg, initial encounter: Secondary | ICD-10-CM

## 2020-10-01 DIAGNOSIS — Z89511 Acquired absence of right leg below knee: Secondary | ICD-10-CM

## 2020-10-01 MED ORDER — OXYCODONE-ACETAMINOPHEN 10-325 MG PO TABS: 1.0000 | ORAL_TABLET | ORAL | 0 refills | Status: DC | PRN

## 2020-10-01 MED ORDER — GABAPENTIN 300 MG PO CAPS: 300.0000 mg | ORAL_CAPSULE | Freq: Three times a day (TID) | ORAL | 3 refills | Status: DC

## 2020-10-02 ENCOUNTER — Telehealth: Payer: Self-pay

## 2020-10-02 NOTE — Telephone Encounter (Signed)
Noted  

## 2020-10-02 NOTE — Telephone Encounter (Signed)
Mark from [facility] called and would like to let Dr. Lajoyce Corners know Patient bumped stump last night. Last night it was bleeding and they kept it wrapped. He states it stopped bleeding and a nurse will come out today to look at it. He will see Dr. Lajoyce Corners Thursday. Loraine Leriche just wanted to let someone know just in case any orders needed to be added while nurse  Is there this PM.    Mark CB [phone]

## 2020-10-05 ENCOUNTER — Encounter: Payer: Self-pay | Admitting: Orthopedic Surgery

## 2020-10-05 NOTE — Progress Notes (Signed)
Office Visit Note   Patient: Peter Mendez.           Date of Birth: 22-Mar-1961           MRN: [id number] Visit Date: 10/01/2020              Requested by: Leilani Able, MD [address] [place],  [place] [address] PCP: Leilani Able, MD  Chief Complaint  Patient presents with   Right Leg - Routine Post Op      HPI: Patient is a 60 year old gentleman who presents 2 weeks status post right below the knee amputation the staples are intact he is wearing a compression shrinker.  Patient has a appointment with vascular vein surgery in 3 weeks.  Assessment & Plan: Visit Diagnoses:  1. PVD (peripheral vascular disease) (HCC)   2. Below-knee amputation of right lower extremity (HCC)     Plan: Prescription was provided for Neurontin for the phantom pain Percocet for his Pain recommended a smaller compression sock.  [name] curren swelling.  The wound is well approximated.  No drainage no cellulitis.  Tly in a 3 XL sock and needs a 2 XL sock.  Follow-Up Instructions: Return in about 1 week (around 10/08/2020).   Ortho Exam  Patient is alert, oriented, no adenopathy, well-dressed, normal affect, normal respiratory effort. Examination patient has increased swelling and has developed blistering of the skin from  Imaging: No results found. No images are attached to the encounter.  Labs: Lab Results  Component Value Date   HGBA1C 8.8 (A) 07/21/2020   HGBA1C 9.3 (A) 04/28/2020   HGBA1C 8.7 (A) 01/21/2020   REPTSTATUS 09/13/2020 FINAL 09/08/2020   CULT  09/08/2020    NO GROWTH 5 DAYS Performed at [facility], [address] [place], [place] [address]      Lab Results  Component Value Date   ALBUMIN 2.1 (L) 09/21/2020   ALBUMIN 2.5 (L) 09/13/2020   ALBUMIN 2.8 (L) 09/12/2020    Lab Results  Component Value Date   MG 1.9 09/24/2020   MG 2.1 09/23/2020   MG 1.9 09/22/2020   No results found for: VD25OH  No results found for: PREALBUMIN CBC EXTENDED Latest Ref  Rng & Units 09/25/2020 09/24/2020 09/23/2020  WBC 4.0 - 10.5 K/uL 8.9 9.9 10.7(H)  RBC 4.22 - 5.81 MIL/uL 2.71(L) 2.51(L) 2.72(L)  HGB 13.0 - 17.0 g/dL 8.1(L) 7.6(L) 8.3(L)  HCT 39.0 - 52.0 % 24.5(L) 22.1(L) 23.5(L)  PLT 150 - 400 K/uL 59(L) 57(L) 48(L)  NEUTROABS 1.7 - 7.7 K/uL - - -  LYMPHSABS 0.7 - 4.0 K/uL - - -     There is no height or weight on file to calculate BMI.  Orders:  No orders of the defined types were placed in this encounter.  Meds ordered this encounter  Medications   gabapentin (NEURONTIN) 300 MG capsule    Sig: Take 1 capsule (300 mg total) by mouth 3 (three) times daily. 3 times a day when necessary neuropathy pain    Dispense:  90 capsule    Refill:  3   oxyCODONE-acetaminophen (PERCOCET) 10-325 MG tablet    Sig: Take 1 tablet by mouth every 4 (four) hours as needed for pain.    Dispense:  30 tablet    Refill:  0     Procedures: No procedures performed  Clinical Data: No additional findings.  ROS:  All other systems negative, except as noted in the HPI. Review of Systems  Objective: Vital Signs: There were no vitals taken for this visit.  Specialty Comments:  No specialty comments available.  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) 05/23/2016   Chronic back pain 12/29/2015   Generalized anxiety disorder 12/29/2015   PVD (peripheral vascular disease) (HCC) 05/09/2014   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   Hyponatremia 02/25/2013   Essential hypertension 02/25/2013   Pure hypercholesterolemia 02/25/2013   Past Medical History:  Diagnosis Date   Chicken pox    Claudication (HCC)    Diabetes 1.5, managed as type 1 (HCC)    Hypertension    Low back pain    PAD (peripheral artery disease) (HCC)    Tobacco abuse     Family History  Problem Relation Age of Onset   Diabetes Mother    Atrial fibrillation Mother    Heart attack Father    Stroke Father    Arthritis Father    Heart disease Father    Diabetes Maternal Uncle    Diabetes Maternal Grandmother    Heart attack Maternal Grandmother    Heart disease Maternal Grandmother    Esophageal cancer Maternal Grandfather    Heart attack Sister        at age 35    Past Surgical History:  Procedure Laterality Date   AMPUTATION Right 09/18/2020   Procedure: RIGHT BELOW KNEE AMPUTATION;  Surgeon: Nadara Mustard, MD;  Location: [facility] OR;  Service: Orthopedics;  Laterality: Right;   AORTA - BILATERAL FEMORAL ARTERY BYPASS GRAFT Bilateral 09/11/2020   Procedure: AORTA BIFEMORAL BYPASS GRAFT;  Surgeon: Larina Earthly, MD;  Location: [facility] OR;  Service: Vascular;  Laterality: Bilateral;   CATARACT EXTRACTION Bilateral    FEMORAL-POPLITEAL BYPASS GRAFT Right 09/11/2020   Procedure: RIGHT FEMORAL-POPLITEAL ARTERY BYPASS GRAFTING;  Surgeon: Larina Earthly, MD;  Location: [facility] OR;  Service: Vascular;  Laterality: Right;   INTRAOCULAR LENS INSERTION Bilateral    RETINAL DETACHMENT SURGERY Bilateral    Social History   Occupational History   Not on file  Tobacco Use   Smoking status: Current Every Day Smoker    Packs/day: 0.75    Years: 42.00    Pack years: 31.50    Types: Cigarettes   Smokeless tobacco: Never Used  Vaping Use   Vaping Use: Never used  Substance and Sexual Activity   Alcohol use: No    Alcohol/week: 0.0 standard drinks   Drug use: Not Currently   Sexual  activity: Not on file

## 2020-10-08 ENCOUNTER — Ambulatory Visit (INDEPENDENT_AMBULATORY_CARE_PROVIDER_SITE_OTHER): Payer: Medicare Other | Admitting: Physician Assistant

## 2020-10-08 ENCOUNTER — Encounter: Payer: Self-pay | Admitting: Orthopedic Surgery

## 2020-10-08 DIAGNOSIS — Z89511 Acquired absence of right leg below knee: Secondary | ICD-10-CM

## 2020-10-08 DIAGNOSIS — S88111A Complete traumatic amputation at level between knee and ankle, right lower leg, initial encounter: Secondary | ICD-10-CM

## 2020-10-08 MED ORDER — DOXYCYCLINE HYCLATE 100 MG PO TABS: 100.0000 mg | ORAL_TABLET | Freq: Two times a day (BID) | ORAL | 0 refills | Status: DC

## 2020-10-08 NOTE — Progress Notes (Signed)
Office Visit Note   Patient: Peter Mendez.           Date of Birth: 1961/02/28           MRN: [id number] Visit Date: 10/08/2020              Requested by: Leilani Able, MD [address] [place],  [place] [address] PCP: Leilani Able, MD  Chief Complaint  Patient presents with  . Right Leg - Routine Post Op    09/18/20 right BKA       HPI: Patient is 3 weeks status post right below-knee amputation.  Overall he is doing well.  He says the home nurse has not been using the shrinker on him as they thought his leg would be better to air out.  They also have taken out a couple of the staples which we thought was falling out.  [name] currently also on high dose of anticoagulation from his recent vascular procedure  Assessment & Plan: Visit Diagnoses: No diagnosis found.  Plan: I will place him just on a week of doxycycline.  Some of the erythema I think is secondary to the anticoagulation but just to be safe we will do this for a week.  Follow-up at that time hopefully remove remainder staples  Follow-Up Instructions: No follow-ups on file.   Ortho Exam  Patient is alert, oriented, no adenopathy, well-dressed, normal affect, normal respiratory effort. Examination overall swelling is well controlled no foul odor no purulence he does have a 3 cm x 1 cm area of dehiscence where the staples have fallen out.  There is no's there is some mild surrounding erythema.  Also an area where he has had a dehiscence with protrusion of the proximal flap.  Again this has some erythema surrounding it but no ascending cellulitis.  Imaging: No results found. No images are attached to the encounter.  Labs: Lab Results  Component Value Date   HGBA1C 8.8 (A) 07/21/2020   HGBA1C 9.3 (A) 04/28/2020   HGBA1C 8.7 (A) 01/21/2020   REPTSTATUS 09/13/2020 FINAL 09/08/2020   CULT  09/08/2020    NO GROWTH 5 DAYS Performed at [facility], [address] [place], [place] [address]      Lab Results   Component Value Date   ALBUMIN 2.1 (L) 09/21/2020   ALBUMIN 2.5 (L) 09/13/2020   ALBUMIN 2.8 (L) 09/12/2020    Lab Results  Component Value Date   MG 1.9 09/24/2020   MG 2.1 09/23/2020   MG 1.9 09/22/2020   No results found for: VD25OH  No results found for: PREALBUMIN CBC EXTENDED Latest Ref Rng & Units 09/25/2020 09/24/2020 09/23/2020  WBC 4.0 - 10.5 K/uL 8.9 9.9 10.7(H)  RBC 4.22 - 5.81 MIL/uL 2.71(L) 2.51(L) 2.72(L)  HGB 13.0 - 17.0 g/dL 8.1(L) 7.6(L) 8.3(L)  HCT 39.0 - 52.0 % 24.5(L) 22.1(L) 23.5(L)  PLT 150 - 400 K/uL 59(L) 57(L) 48(L)  NEUTROABS 1.7 - 7.7 K/uL - - -  LYMPHSABS 0.7 - 4.0 K/uL - - -     There is no height or weight on file to calculate BMI.  Orders:  No orders of the defined types were placed in this encounter.  No orders of the defined types were placed in this encounter.    Procedures: No procedures performed  Clinical Data: No additional findings.  ROS:  All other systems negative, except as noted in the HPI. Review of Systems  Objective: Vital Signs: There were  no vitals taken for this visit.  Specialty Comments:  No specialty comments available.  PMFS History: Patient Active Problem List   Diagnosis Date Noted  . IDA (iron deficiency anemia) 09/24/2020  . Tinea cruris   . HIT (heparin-induced thrombocytopenia) (HCC)   . Pain of right lower extremity   . Atrial fibrillation (HCC)   . Typical atrial flutter (HCC)   . Pressure injury of skin 09/13/2020  . PAOD (peripheral arterial occlusive disease) (HCC)   . Multiple wounds of skin   . Lower limb ischemia 09/08/2020  . Gangrene of right foot (HCC)   . Chronic low back pain 04/27/2020  . 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 Chronic back pain 12/29/2015  . Generalized anxiety disorder 12/29/2015  . PVD (peripheral vascular disease) (HCC) 05/09/2014  . 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 Hyponatremia 02/25/2013  . Essential hypertension 02/25/2013  . Pure hypercholesterolemia 02/25/2013   Past Medical History:  Diagnosis Date  . Chicken pox   . Claudication (HCC)   . Diabetes 1.5, managed as type 1 (HCC)   . Hypertension   . Low back pain   . PAD (peripheral artery disease) (HCC)   . Tobacco abuse     Family History  Problem Relation Age of Onset  . Diabetes Mother   . Atrial fibrillation Mother   . Heart attack Father   . Stroke Father   . Arthritis Father   . Heart disease Father   . Diabetes Maternal Uncle   . Diabetes Maternal Grandmother   . Heart attack Maternal Grandmother   . Heart disease Maternal Grandmother   . Esophageal cancer Maternal Grandfather   . Heart attack Sister        at age 19    Past Surgical History:  Procedure Laterality Date  . AMPUTATION Right 09/18/2020   Procedure: RIGHT BELOW KNEE AMPUTATION;  Surgeon: Nadara Mustard, MD;  Location: [facility] OR;  Service: Orthopedics;  Laterality: Right;  . AORTA - BILATERAL FEMORAL ARTERY BYPASS GRAFT Bilateral 09/11/2020   Procedure: AORTA BIFEMORAL BYPASS GRAFT;  Surgeon: Larina Earthly, MD;  Location: [facility] OR;  Service: Vascular;  Laterality: Bilateral;  . CATARACT EXTRACTION Bilateral   . FEMORAL-POPLITEAL BYPASS GRAFT Right 09/11/2020   Procedure: RIGHT FEMORAL-POPLITEAL ARTERY BYPASS GRAFTING;  Surgeon: Larina Earthly, MD;  Location: [facility] OR;  Service: Vascular;  Laterality: Right;  . INTRAOCULAR LENS INSERTION Bilateral   . RETINAL DETACHMENT SURGERY Bilateral    Social History   Occupational History  . Not on file  Tobacco Use  . Smoking status: Current Every Day Smoker    Packs/day: 0.75    Years: 42.00    Pack years: 31.50    Types: Cigarettes  . Smokeless tobacco: Never Used  Vaping Use  . Vaping Use: Never used  Substance and Sexual Activity  . Alcohol use: No    Alcohol/week: 0.0 standard drinks  . Drug use: Not Currently  . Sexual activity: Not on file

## 2020-10-15 ENCOUNTER — Ambulatory Visit (INDEPENDENT_AMBULATORY_CARE_PROVIDER_SITE_OTHER): Payer: Medicare Other | Admitting: Physician Assistant

## 2020-10-15 ENCOUNTER — Encounter: Payer: Self-pay | Admitting: Orthopedic Surgery

## 2020-10-15 DIAGNOSIS — Z89511 Acquired absence of right leg below knee: Secondary | ICD-10-CM

## 2020-10-15 DIAGNOSIS — S88111A Complete traumatic amputation at level between knee and ankle, right lower leg, initial encounter: Secondary | ICD-10-CM

## 2020-10-15 MED ORDER — OXYCODONE-ACETAMINOPHEN 10-325 MG PO TABS: 1.0000 | ORAL_TABLET | Freq: Four times a day (QID) | ORAL | 0 refills | Status: DC | PRN

## 2020-10-15 NOTE — Progress Notes (Signed)
Office Visit Note   Patient: Peter Mendez.           Date of Birth: 1961/05/14           MRN: [id number] Visit Date: 10/15/2020              Requested by: Leilani Able, MD [address] [place],  [place] [address] PCP: Leilani Able, MD  Chief Complaint  Patient presents with  . Right Leg - Routine Post Op    09/18/20 right BKA       HPI: Patient presents today 1 month status post right below-knee amputation.  He has been doing daily cleansing wearing a shrinker his family has just picked up a smaller 2 XL shrinker  Assessment & Plan: Visit Diagnoses: No diagnosis found.  Plan: Continue with smaller shrinker against the skin follow-up in 2 weeks  Follow-Up Instructions: No follow-ups on file.   Ortho Exam  Patient is alert, oriented, no adenopathy, well-dressed, normal affect, normal respiratory effort. Examination healing incision with scabbing.  There is 1 small area of dehiscence that is approximately 3 cm x 1 cm this does not probe deeply has healthy granulation tissue there is no ascending cellulitis.  Swelling is overall well controlled surgical staples were harvested today  Imaging: No results found. No images are attached to the encounter.  Labs: Lab Results  Component Value Date   HGBA1C 8.8 (A) 07/21/2020   HGBA1C 9.3 (A) 04/28/2020   HGBA1C 8.7 (A) 01/21/2020   REPTSTATUS 09/13/2020 FINAL 09/08/2020   CULT  09/08/2020    NO GROWTH 5 DAYS Performed at [facility], [address] [place], [place] [address]      Lab Results  Component Value Date   ALBUMIN 2.1 (L) 09/21/2020   ALBUMIN 2.5 (L) 09/13/2020   ALBUMIN 2.8 (L) 09/12/2020    Lab Results  Component Value Date   MG 1.9 09/24/2020   MG 2.1 09/23/2020   MG 1.9 09/22/2020   No results found for: VD25OH  No results found for: PREALBUMIN CBC EXTENDED Latest Ref Rng & Units 09/25/2020 09/24/2020 09/23/2020  WBC 4.0 - 10.5 K/uL 8.9 9.9 10.7(H)  RBC 4.22 - 5.81 MIL/uL 2.71(L) 2.51(L)  2.72(L)  HGB 13.0 - 17.0 g/dL 8.1(L) 7.6(L) 8.3(L)  HCT 39.0 - 52.0 % 24.5(L) 22.1(L) 23.5(L)  PLT 150 - 400 K/uL 59(L) 57(L) 48(L)  NEUTROABS 1.7 - 7.7 K/uL - - -  LYMPHSABS 0.7 - 4.0 K/uL - - -     There is no height or weight on file to calculate BMI.  Orders:  No orders of the defined types were placed in this encounter.  Meds ordered this encounter  Medications  . oxyCODONE-acetaminophen (PERCOCET) 10-325 MG tablet    Sig: Take 1 tablet by mouth every 6 (six) hours as needed for pain.    Dispense:  30 tablet    Refill:  0     Procedures: No procedures performed  Clinical Data: No additional findings.  ROS:  All other systems negative, except as noted in the HPI. Review of Systems  Objective: Vital Signs: There were no vitals taken for this visit.  Specialty Comments:  No specialty comments available.  PMFS History: Patient Active Problem List   Diagnosis Date Noted  . IDA (iron deficiency anemia) 09/24/2020  . Tinea cruris   . HIT (heparin-induced thrombocytopenia) (HCC)   . Pain of right lower extremity   . Atrial fibrillation (HCC)   .  Typical atrial flutter (HCC)   . Pressure injury of skin 09/13/2020  . PAOD (peripheral arterial occlusive disease) (HCC)   . Multiple wounds of skin   . Lower limb ischemia 09/08/2020  . Gangrene of right foot (HCC)   . Chronic low back pain 04/27/2020  . 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 Chronic back pain 12/29/2015  . Generalized anxiety disorder 12/29/2015  . PVD (peripheral vascular disease) (HCC) 05/09/2014  . 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 Hyponatremia 02/25/2013  . Essential hypertension 02/25/2013  . Pure hypercholesterolemia 02/25/2013   Past Medical History:  Diagnosis Date  . Chicken pox   . Claudication (HCC)   . Diabetes 1.5, managed as type 1 (HCC)   . Hypertension   . Low back pain   . PAD  (peripheral artery disease) (HCC)   . Tobacco abuse     Family History  Problem Relation Age of Onset  . Diabetes Mother   . Atrial fibrillation Mother   . Heart attack Father   . Stroke Father   . Arthritis Father   . Heart disease Father   . Diabetes Maternal Uncle   . Diabetes Maternal Grandmother   . Heart attack Maternal Grandmother   . Heart disease Maternal Grandmother   . Esophageal cancer Maternal Grandfather   . Heart attack Sister        at age 74    Past Surgical History:  Procedure Laterality Date  . AMPUTATION Right 09/18/2020   Procedure: RIGHT BELOW KNEE AMPUTATION;  Surgeon: Nadara Mustard, MD;  Location: [facility] OR;  Service: Orthopedics;  Laterality: Right;  . AORTA - BILATERAL FEMORAL ARTERY BYPASS GRAFT Bilateral 09/11/2020   Procedure: AORTA BIFEMORAL BYPASS GRAFT;  Surgeon: Larina Earthly, MD;  Location: [facility] OR;  Service: Vascular;  Laterality: Bilateral;  . CATARACT EXTRACTION Bilateral   . FEMORAL-POPLITEAL BYPASS GRAFT Right 09/11/2020   Procedure: RIGHT FEMORAL-POPLITEAL ARTERY BYPASS GRAFTING;  Surgeon: Larina Earthly, MD;  Location: [facility] OR;  Service: Vascular;  Laterality: Right;  . INTRAOCULAR LENS INSERTION Bilateral   . RETINAL DETACHMENT SURGERY Bilateral    Social History   Occupational History  . Not on file  Tobacco Use  . Smoking status: Current Every Day Smoker    Packs/day: 0.75    Years: 42.00    Pack years: 31.50    Types: Cigarettes  . Smokeless tobacco: Never Used  Vaping Use  . Vaping Use: Never used  Substance and Sexual Activity  . Alcohol use: No    Alcohol/week: 0.0 standard drinks  . Drug use: Not Currently  . Sexual activity: Not on file

## 2020-10-18 ENCOUNTER — Other Ambulatory Visit: Payer: Self-pay | Admitting: Internal Medicine

## 2020-10-19 ENCOUNTER — Ambulatory Visit: Payer: Medicare Other | Admitting: Endocrinology

## 2020-10-19 ENCOUNTER — Encounter: Payer: Self-pay | Admitting: Vascular Surgery

## 2020-10-19 ENCOUNTER — Other Ambulatory Visit: Payer: Self-pay

## 2020-10-19 ENCOUNTER — Telehealth: Payer: Self-pay | Admitting: Orthopedic Surgery

## 2020-10-19 ENCOUNTER — Ambulatory Visit (INDEPENDENT_AMBULATORY_CARE_PROVIDER_SITE_OTHER): Payer: Medicare Other | Admitting: Vascular Surgery

## 2020-10-19 VITALS — BP 165/73 | HR 56 | Temp 97.7°F | Resp 12 | Ht 71.0 in | Wt 135.0 lb

## 2020-10-19 DIAGNOSIS — Z95828 Presence of other vascular implants and grafts: Secondary | ICD-10-CM

## 2020-10-19 NOTE — Progress Notes (Signed)
[facility] [place]  Patient name: Peter Mendez. MRN: [id number] DOB: 1960/12/14 Sex: male  REASON FOR VISIT: Follow-up aortobifemoral and right femoral to popliteal bypass  HPI: Peter Mendez. is a 60 y.o. male here today for follow-up.  [name] here with his significant other.  He presented with extensive tissue loss in his right foot.  He underwent aortobifemoral bypass and right femoral to popliteal bypass with saphenous vein.  Unfortunately he had extensive gangrene and was not able to be reversed.  He subsequently underwent right below-knee amputation with Dr. Lajoyce Corners.  He did extremely well with physical therapy and was discharged to nursing facility.  He looks quite good today.  [name] really stored weight he lost around the time of surgery.  Current Outpatient Medications  Medication Sig Dispense Refill  . acetaminophen (TYLENOL) 500 MG tablet Take 1,000 mg by mouth every 6 (six) hours as needed for mild pain or headache.    . alprazolam (XANAX) 2 MG tablet take 1 tablet by mouth twice a day if needed for anxiety (Patient taking differently: Take 1 mg by mouth 3 (three) times daily as needed for anxiety.) 60 tablet 0  . aspirin EC 81 MG EC tablet Take 1 tablet (81 mg total) by mouth daily. Swallow whole. 30 tablet 11  . Continuous Blood Gluc Sensor (FREESTYLE LIBRE 14 DAY SENSOR) MISC 1 Units by Does not apply route every 14 (fourteen) days. 6 each 4  . DROPLET PEN NEEDLES 31G X 8 MM MISC USE TO INJECT  INSULIN FIVE TIMES DAILY 500 each 0  . fluconazole (DIFLUCAN) 150 MG tablet Take 1 tablet (150 mg total) by mouth once a week. 2 tablet 0  . gabapentin (NEURONTIN) 300 MG capsule Take 1 capsule (300 mg total) by mouth 3 (three) times daily. 270 capsule 3  . gabapentin (NEURONTIN) 300 MG capsule Take 1 capsule (300 mg total) by mouth 3 (three) times daily. 3 times a day when necessary neuropathy pain 90 capsule 3  . insulin glargine  (LANTUS SOLOSTAR) 100 UNIT/ML Solostar Pen INJECT 15 UNITS SUBCUTANEOUSLY IN THE MORNING AND 15 UNITS IN THE EVENING. 45 mL 0  . Lancets (ONETOUCH ULTRASOFT) lancets Use as instructed to check blood sugars 7 times per day 210 each 3  . NOVOLOG FLEXPEN 100 UNIT/ML FlexPen INJECT 6 TO 10 UNITS BEFORE MEALS AS DIRECTED  (PEN EXPIRES 28 DAYS AFTER OPENED) (Patient taking differently: Inject 4-6 Units into the skin 3 (three) times daily with meals.) 30 mL 1  . omeprazole (PRILOSEC) 20 MG capsule Take 20 mg by mouth daily.    Marland Kitchen oxyCODONE-acetaminophen (PERCOCET) 10-325 MG tablet Take 1 tablet by mouth every 6 (six) hours as needed for pain. 30 tablet 0  . ramipril (ALTACE) 5 MG capsule TAKE 1 CAPSULE EVERY DAY (Patient taking differently: Take 5 mg by mouth daily.) 90 capsule 1  . rivaroxaban (XARELTO) 20 MG TABS tablet Take 1 tablet (20 mg total) by mouth daily with supper for 7 days. 7 tablet 0  . rosuvastatin (CRESTOR) 20 MG tablet Take 1 tablet (20 mg total) by mouth daily. 30 tablet 0  . doxycycline (VIBRA-TABS) 100 MG tablet Take 1 tablet (100 mg total) by mouth 2 (two) times daily. (Patient not taking: Reported on 10/19/2020) 20 tablet 0  . Rivaroxaban (XARELTO) 15 MG TABS tablet Take 1 tablet (15 mg total) by mouth 2 (two) times daily with a meal for 18 days. 36 tablet  0   No current facility-administered medications for this visit.     PHYSICAL EXAM: Vitals:   10/19/20 1459  BP: (!) 165/73  Pulse: (!) 56  Resp: 12  Temp: 97.7 F (36.5 C)  TempSrc: Other (Comment)  SpO2: 100%  Weight: 135 lb (61.2 kg)  Height: 5\' 11"  (1.803 m)    GENERAL: The patient is a well-nourished male, in no acute distress. The vital signs are documented above. Abdominal incision is completely healed.  He does have some Vicryl stitch abscess in the left and slightly in the right groin.  These 0 Vicryl sutures were removed.  No evidence of deep infection 2+ femoral pulses bilaterally.  2+ right popliteal pulse  above his amputation  MEDICAL ISSUES: Stable overall.  Will continue his physical therapy.  We will see him again in 3 months for final follow-up   , MD [facility] [phone]  Note: Portions of this report may have been transcribed using voice recognition software.  Every effort has been made to ensure accuracy; however, inadvertent computerized transcription errors may still be present.

## 2020-10-19 NOTE — Telephone Encounter (Signed)
Received call from [facility] needing verbal orders to extend (OT) for 2 Wk 1 and 1 Wk 1. The number to contact Will is [phone]

## 2020-10-20 NOTE — Telephone Encounter (Signed)
Pt is s/p BKA I called and gave verbal ok for orders as requested below.

## 2020-10-26 ENCOUNTER — Other Ambulatory Visit: Payer: Self-pay | Admitting: Physician Assistant

## 2020-10-26 ENCOUNTER — Other Ambulatory Visit: Payer: Self-pay | Admitting: Orthopedic Surgery

## 2020-10-26 MED ORDER — OXYCODONE-ACETAMINOPHEN 5-325 MG PO TABS: 1.0000 | ORAL_TABLET | Freq: Four times a day (QID) | ORAL | 0 refills | Status: DC | PRN

## 2020-10-26 MED ORDER — OXYCODONE-ACETAMINOPHEN 10-325 MG PO TABS: 1.0000 | ORAL_TABLET | Freq: Three times a day (TID) | ORAL | 0 refills | Status: DC | PRN

## 2020-10-29 ENCOUNTER — Ambulatory Visit (INDEPENDENT_AMBULATORY_CARE_PROVIDER_SITE_OTHER): Payer: Medicare Other | Admitting: Orthopedic Surgery

## 2020-10-29 ENCOUNTER — Encounter: Payer: Self-pay | Admitting: Orthopedic Surgery

## 2020-10-29 DIAGNOSIS — Z89511 Acquired absence of right leg below knee: Secondary | ICD-10-CM

## 2020-10-29 DIAGNOSIS — S88111A Complete traumatic amputation at level between knee and ankle, right lower leg, initial encounter: Secondary | ICD-10-CM

## 2020-10-29 NOTE — Progress Notes (Signed)
Office Visit Note   Patient: Peter Mendez.           Date of Birth: [birth year]           MRN: [id number] Visit Date: 10/29/2020              Requested by: Leilani Able, MD [address] [place],  [place] [address] PCP: Leilani Able, MD  Chief Complaint  Patient presents with  . Right Leg - Routine Post Op    09/18/20 right BKA       HPI: Patient is a 60 year old gentleman who presents 5 weeks status post right below the knee amputation he is currently wearing a double extra-large stump shrinker.  [name] working with Technical sales engineer.  Assessment & Plan: Visit Diagnoses:  1. Below-knee amputation of right lower extremity (HCC)     Plan: Recommended that he get a extra-large or large stump shrinker.  Continue with current care  Follow-Up Instructions: Return in about 4 weeks (around 11/26/2020).   Ortho Exam  Patient is alert, oriented, no adenopathy, well-dressed, normal affect, normal respiratory effort. Examination patient has a few scabs that were removed 1 area had retained suture and the suture knot was removed.  The wounds are not deep there superficial good healthy granulation tissue no cellulitis no swelling no signs of infection.  Imaging: No results found. No images are attached to the encounter.  Labs: Lab Results  Component Value Date   HGBA1C 8.8 (A) 07/21/2020   HGBA1C 9.3 (A) 04/28/2020   HGBA1C 8.7 (A) 01/21/2020   REPTSTATUS 09/13/2020 FINAL 09/08/2020   CULT  09/08/2020    NO GROWTH 5 DAYS Performed at [facility], [address] [place], [place] [address]      Lab Results  Component Value Date   ALBUMIN 2.1 (L) 09/21/2020   ALBUMIN 2.5 (L) 09/13/2020   ALBUMIN 2.8 (L) 09/12/2020    Lab Results  Component Value Date   MG 1.9 09/24/2020   MG 2.1 09/23/2020   MG 1.9 09/22/2020   No results found for: VD25OH  No results found for: PREALBUMIN CBC EXTENDED Latest Ref Rng & Units 09/25/2020 09/24/2020 09/23/2020  WBC 4.0 - 10.5 K/uL 8.9 9.9  10.7(H)  RBC 4.22 - 5.81 MIL/uL 2.71(L) 2.51(L) 2.72(L)  HGB 13.0 - 17.0 g/dL 8.1(L) 7.6(L) 8.3(L)  HCT 39.0 - 52.0 % 24.5(L) 22.1(L) 23.5(L)  PLT 150 - 400 K/uL 59(L) 57(L) 48(L)  NEUTROABS 1.7 - 7.7 K/uL - - -  LYMPHSABS 0.7 - 4.0 K/uL - - -     There is no height or weight on file to calculate BMI.  Orders:  No orders of the defined types were placed in this encounter.  No orders of the defined types were placed in this encounter.    Procedures: No procedures performed  Clinical Data: No additional findings.  ROS:  All other systems negative, except as noted in the HPI. Review of Systems  Objective: Vital Signs: There were no vitals taken for this visit.  Specialty Comments:  No specialty comments available.  PMFS History: Patient Active Problem List   Diagnosis Date Noted  . IDA (iron deficiency anemia) 09/24/2020  . Tinea cruris   . HIT (heparin-induced thrombocytopenia) (HCC)   . Pain of right lower extremity   . Atrial fibrillation (HCC)   . Typical atrial flutter (HCC)   . Pressure injury of skin 09/13/2020  . PAOD (peripheral arterial occlusive disease) (HCC)   .  Multiple wounds of skin   . Lower limb ischemia 09/08/2020  . Gangrene of right foot (HCC)   . Chronic low back pain 04/27/2020  . 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 Chronic back pain 12/29/2015  . Generalized anxiety disorder 12/29/2015  . PVD (peripheral vascular disease) (HCC) 05/09/2014  . 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 Hyponatremia 02/25/2013  . Essential hypertension 02/25/2013  . Pure hypercholesterolemia 02/25/2013   Past Medical History:  Diagnosis Date  . Chicken pox   . Claudication (HCC)   . Diabetes 1.5, managed as type 1 (HCC)   . Hypertension   . Low back pain   . PAD (peripheral artery disease) (HCC)   . Tobacco abuse     Family History  Problem Relation Age of Onset   . Diabetes Mother   . Atrial fibrillation Mother   . Heart attack Father   . Stroke Father   . Arthritis Father   . Heart disease Father   . Diabetes Maternal Uncle   . Diabetes Maternal Grandmother   . Heart attack Maternal Grandmother   . Heart disease Maternal Grandmother   . Esophageal cancer Maternal Grandfather   . Heart attack Sister        at age 71    Past Surgical History:  Procedure Laterality Date  . AMPUTATION Right 09/18/2020   Procedure: RIGHT BELOW KNEE AMPUTATION;  Surgeon: Nadara Mustard, MD;  Location: [facility] OR;  Service: Orthopedics;  Laterality: Right;  . AORTA - BILATERAL FEMORAL ARTERY BYPASS GRAFT Bilateral 09/11/2020   Procedure: AORTA BIFEMORAL BYPASS GRAFT;  Surgeon: Larina Earthly, MD;  Location: [facility] OR;  Service: Vascular;  Laterality: Bilateral;  . CATARACT EXTRACTION Bilateral   . FEMORAL-POPLITEAL BYPASS GRAFT Right 09/11/2020   Procedure: RIGHT FEMORAL-POPLITEAL ARTERY BYPASS GRAFTING;  Surgeon: Larina Earthly, MD;  Location: [facility] OR;  Service: Vascular;  Laterality: Right;  . INTRAOCULAR LENS INSERTION Bilateral   . RETINAL DETACHMENT SURGERY Bilateral    Social History   Occupational History  . Not on file  Tobacco Use  . Smoking status: Current Every Day Smoker    Packs/day: 0.75    Years: 42.00    Pack years: 31.50    Types: Cigarettes  . Smokeless tobacco: Never Used  Vaping Use  . Vaping Use: Never used  Substance and Sexual Activity  . Alcohol use: No    Alcohol/week: 0.0 standard drinks  . Drug use: Not Currently  . Sexual activity: Not on file

## 2020-10-30 ENCOUNTER — Other Ambulatory Visit: Payer: Self-pay | Admitting: Endocrinology

## 2020-11-09 ENCOUNTER — Other Ambulatory Visit: Payer: Self-pay | Admitting: Physician Assistant

## 2020-11-17 ENCOUNTER — Other Ambulatory Visit: Payer: Self-pay | Admitting: Physician Assistant

## 2020-11-17 MED ORDER — OXYCODONE-ACETAMINOPHEN 5-325 MG PO TABS: 1.0000 | ORAL_TABLET | Freq: Three times a day (TID) | ORAL | 0 refills | Status: DC | PRN

## 2020-11-20 ENCOUNTER — Other Ambulatory Visit: Payer: Self-pay | Admitting: Internal Medicine

## 2020-11-23 ENCOUNTER — Other Ambulatory Visit: Payer: Self-pay | Admitting: Physician Assistant

## 2020-11-24 ENCOUNTER — Other Ambulatory Visit: Payer: Self-pay | Admitting: Physician Assistant

## 2020-11-26 ENCOUNTER — Encounter: Payer: Self-pay | Admitting: Orthopedic Surgery

## 2020-11-26 ENCOUNTER — Ambulatory Visit (INDEPENDENT_AMBULATORY_CARE_PROVIDER_SITE_OTHER): Payer: Medicare Other | Admitting: Physician Assistant

## 2020-11-26 DIAGNOSIS — Z89511 Acquired absence of right leg below knee: Secondary | ICD-10-CM

## 2020-11-26 DIAGNOSIS — S88111A Complete traumatic amputation at level between knee and ankle, right lower leg, initial encounter: Secondary | ICD-10-CM

## 2020-11-26 NOTE — Progress Notes (Signed)
Office Visit Note   Patient: Peter Mendez.           Date of Birth: 05-23-1961           MRN: [id number] Visit Date: 11/26/2020              Requested by: Leilani Able, MD [address] [place],  [place] [address] PCP: Leilani Able, MD  Chief Complaint  Patient presents with  . Right Leg - Routine Post Op    09/18/20 right BKA       HPI: The patient is a pleasant 60 year old gentleman who is 2-1/2 months status post right below-knee amputation he is wearing his shrinker and just has some scabbing.  Denies any drainage he is asking for a referral for a prosthetic  Assessment & Plan: Visit Diagnoses: No diagnosis found.  Plan: Referral was given to patient.  Follow-up in 1 month continue to use shrinker  Follow-Up Instructions: No follow-ups on file.   Ortho Exam  Patient is alert, oriented, no adenopathy, well-dressed, normal affect, normal respiratory effort. Examination of his amputation stump is well-healed surgical incision he does have some scabbing but no drainage no erythema swelling is very well controlled no signs of infection or cellulitis Patient is a new right transtibial  amputee.  Patient's current comorbidities are not expected to impact the ability to function with the prescribed prosthesis. Patient verbally communicates a strong desire to use a prosthesis. Patient currently requires mobility aids to ambulate without a prosthesis.  Expects not to use mobility aids with a new prosthesis.  Patient is a K3 level ambulator that spends a lot of time walking around on uneven terrain over obstacles, up and down stairs, and ambulates with a variable cadence.    Imaging: No results found.     Labs: Lab Results  Component Value Date   HGBA1C 8.8 (A) 07/21/2020   HGBA1C 9.3 (A) 04/28/2020   HGBA1C 8.7 (A) 01/21/2020   REPTSTATUS 09/13/2020 FINAL 09/08/2020   CULT  09/08/2020    NO GROWTH 5 DAYS Performed at [facility], [address]  [place], [place] [address]      Lab Results  Component Value Date   ALBUMIN 2.1 (L) 09/21/2020   ALBUMIN 2.5 (L) 09/13/2020   ALBUMIN 2.8 (L) 09/12/2020    Lab Results  Component Value Date   MG 1.9 09/24/2020   MG 2.1 09/23/2020   MG 1.9 09/22/2020   No results found for: VD25OH  No results found for: PREALBUMIN CBC EXTENDED Latest Ref Rng & Units 09/25/2020 09/24/2020 09/23/2020  WBC 4.0 - 10.5 K/uL 8.9 9.9 10.7(H)  RBC 4.22 - 5.81 MIL/uL 2.71(L) 2.51(L) 2.72(L)  HGB 13.0 - 17.0 g/dL 8.1(L) 7.6(L) 8.3(L)  HCT 39.0 - 52.0 % 24.5(L) 22.1(L) 23.5(L)  PLT 150 - 400 K/uL 59(L) 57(L) 48(L)  NEUTROABS 1.7 - 7.7 K/uL - - -  LYMPHSABS 0.7 - 4.0 K/uL - - -     There is no height or weight on file to calculate BMI.  Orders:  No orders of the defined types were placed in this encounter.  No orders of the defined types were placed in this encounter.    Procedures: No procedures performed  Clinical Data: No additional findings.  ROS:  All other systems negative, except as noted in the HPI. Review of Systems  Objective: Vital Signs: There were no vitals taken for this visit.  Specialty Comments:  No specialty comments available.  PMFS History: Patient Active Problem List   Diagnosis Date Noted  . IDA (iron deficiency anemia) 09/24/2020  . Tinea cruris   . HIT (heparin-induced thrombocytopenia) (HCC)   . Pain of right lower extremity   . Atrial fibrillation (HCC)   . Typical atrial flutter (HCC)   . Pressure injury of skin 09/13/2020  . PAOD (peripheral arterial occlusive disease) (HCC)   . Multiple wounds of skin   . Lower limb ischemia 09/08/2020  . Gangrene of right foot (HCC)   . Chronic low back pain 04/27/2020  . 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 Chronic back pain 12/29/2015  . Generalized anxiety disorder 12/29/2015  . PVD (peripheral vascular disease) (HCC) 05/09/2014  . LADA (latent autoimmune  diabetes mellitus in adults) (HCC) 02/25/2013  . Hyponatremia 02/25/2013  . Essential hypertension 02/25/2013  . Pure hypercholesterolemia 02/25/2013   Past Medical History:  Diagnosis Date  . Chicken pox   . Claudication (HCC)   . Diabetes 1.5, managed as type 1 (HCC)   . Hypertension   . Low back pain   . PAD (peripheral artery disease) (HCC)   . Tobacco abuse     Family History  Problem Relation Age of Onset  . Diabetes Mother   . Atrial fibrillation Mother   . Heart attack Father   . Stroke Father   . Arthritis Father   . Heart disease Father   . Diabetes Maternal Uncle   . Diabetes Maternal Grandmother   . Heart attack Maternal Grandmother   . Heart disease Maternal Grandmother   . Esophageal cancer Maternal Grandfather   . Heart attack Sister        at age 79    Past Surgical History:  Procedure Laterality Date  . AMPUTATION Right 09/18/2020   Procedure: RIGHT BELOW KNEE AMPUTATION;  Surgeon: Nadara Mustard, MD;  Location: [facility] OR;  Service: Orthopedics;  Laterality: Right;  . AORTA - BILATERAL FEMORAL ARTERY BYPASS GRAFT Bilateral 09/11/2020   Procedure: AORTA BIFEMORAL BYPASS GRAFT;  Surgeon: Larina Earthly, MD;  Location: [facility] OR;  Service: Vascular;  Laterality: Bilateral;  . CATARACT EXTRACTION Bilateral   . FEMORAL-POPLITEAL BYPASS GRAFT Right 09/11/2020   Procedure: RIGHT FEMORAL-POPLITEAL ARTERY BYPASS GRAFTING;  Surgeon: Larina Earthly, MD;  Location: [facility] OR;  Service: Vascular;  Laterality: Right;  . INTRAOCULAR LENS INSERTION Bilateral   . RETINAL DETACHMENT SURGERY Bilateral    Social History   Occupational History  . Not on file  Tobacco Use  . Smoking status: Current Every Day Smoker    Packs/day: 0.75    Years: 42.00    Pack years: 31.50    Types: Cigarettes  . Smokeless tobacco: Never Used  Vaping Use  . Vaping Use: Never used  Substance and Sexual Activity  . Alcohol use: No    Alcohol/week: 0.0 standard drinks  . Drug use: Not Currently  .  Sexual activity: Not on file

## 2020-11-27 ENCOUNTER — Telehealth: Payer: Self-pay

## 2020-11-27 ENCOUNTER — Other Ambulatory Visit: Payer: Self-pay | Admitting: Physician Assistant

## 2020-11-27 NOTE — Telephone Encounter (Signed)
Request for pain medication refill

## 2020-11-27 NOTE — Telephone Encounter (Signed)
Duplicate request. This has been done.

## 2020-11-27 NOTE — Telephone Encounter (Signed)
PA received refill request fro pharm and this has been sent it.

## 2020-11-27 NOTE — Telephone Encounter (Signed)
Request for pain medication refill  

## 2020-12-08 ENCOUNTER — Other Ambulatory Visit: Payer: Self-pay | Admitting: Physician Assistant

## 2020-12-19 ENCOUNTER — Other Ambulatory Visit: Payer: Self-pay | Admitting: Internal Medicine

## 2020-12-24 ENCOUNTER — Encounter: Payer: Self-pay | Admitting: Physician Assistant

## 2020-12-24 ENCOUNTER — Telehealth: Payer: Self-pay | Admitting: Physician Assistant

## 2020-12-24 ENCOUNTER — Ambulatory Visit (INDEPENDENT_AMBULATORY_CARE_PROVIDER_SITE_OTHER): Payer: Medicare Other | Admitting: Physician Assistant

## 2020-12-24 DIAGNOSIS — Z89511 Acquired absence of right leg below knee: Secondary | ICD-10-CM

## 2020-12-24 DIAGNOSIS — S88111A Complete traumatic amputation at level between knee and ankle, right lower leg, initial encounter: Secondary | ICD-10-CM

## 2020-12-24 NOTE — Telephone Encounter (Signed)
Mo from [facility] called requesting a call back from Autumn or PA Persons. Mo states patients upcoming referral appt with [facility] is not covered for transportation and need approval from [place] that we will take care of the billing for transport. Mo states she leaves at 4 pm and when call is returned any transport rep can be given approval or denial of cost for transport. Please call [phone].

## 2020-12-24 NOTE — Progress Notes (Signed)
Office Visit Note   Patient: Peter Mendez.           Date of Birth: 1961/01/28           MRN: [id number] Visit Date: 12/24/2020              Requested by: Leilani Able, MD [address] [place],  [place] [address] PCP: Leilani Able, MD  Chief Complaint  Patient presents with  . Right Leg - Follow-up    09/18/20 right BKA       HPI: Patient is a pleasant 60 year old gentleman who is 2-1/52-month status post right below-knee amputation.  [name] trying to make an appointment with [facility] to have his prosthetic fashioned.  He noticed after a scab came off that he had a small piece of suture but  Assessment & Plan: Visit Diagnoses: No diagnosis found.  Plan: Patient will follow up in 6 weeks.  At that time we will refer him to physical therapy  Follow-Up Instructions: No follow-ups on file.   Ortho Exam  Patient is alert, oriented, no adenopathy, well-dressed, normal affect, normal respiratory effort. Examination well-healed surgical incision no drainage no erythema no cellulitis.  On 1 and he has a small piece of dyed Vicryl suture.  This was removed without difficulty.  He just had a spot of bleeding beneath no signs of infection no swelling  Imaging: No results found. No images are attached to the encounter.  Labs: Lab Results  Component Value Date   HGBA1C 8.8 (A) 07/21/2020   HGBA1C 9.3 (A) 04/28/2020   HGBA1C 8.7 (A) 01/21/2020   REPTSTATUS 09/13/2020 FINAL 09/08/2020   CULT  09/08/2020    NO GROWTH 5 DAYS Performed at [facility], [address], [place], [place] [address]      Lab Results  Component Value Date   ALBUMIN 2.1 (L) 09/21/2020   ALBUMIN 2.5 (L) 09/13/2020   ALBUMIN 2.8 (L) 09/12/2020    Lab Results  Component Value Date   MG 1.9 09/24/2020   MG 2.1 09/23/2020   MG 1.9 09/22/2020   No results found for: VD25OH  No results found for: PREALBUMIN CBC EXTENDED Latest Ref Rng & Units 09/25/2020 09/24/2020 09/23/2020  WBC 4.0 - 10.5 K/uL  8.9 9.9 10.7(H)  RBC 4.22 - 5.81 MIL/uL 2.71(L) 2.51(L) 2.72(L)  HGB 13.0 - 17.0 g/dL 8.1(L) 7.6(L) 8.3(L)  HCT 39.0 - 52.0 % 24.5(L) 22.1(L) 23.5(L)  PLT 150 - 400 K/uL 59(L) 57(L) 48(L)  NEUTROABS 1.7 - 7.7 K/uL - - -  LYMPHSABS 0.7 - 4.0 K/uL - - -     There is no height or weight on file to calculate BMI.  Orders:  No orders of the defined types were placed in this encounter.  No orders of the defined types were placed in this encounter.    Procedures: No procedures performed  Clinical Data: No additional findings.  ROS:  All other systems negative, except as noted in the HPI. Review of Systems  Objective: Vital Signs: There were no vitals taken for this visit.  Specialty Comments:  No specialty comments available.  PMFS History: Patient Active Problem List   Diagnosis Date Noted  . IDA (iron deficiency anemia) 09/24/2020  . Tinea cruris   . HIT (heparin-induced thrombocytopenia) (HCC)   . Pain of right lower extremity   . Atrial fibrillation (HCC)   . Typical atrial flutter (HCC)   . Pressure injury of skin 09/13/2020  . PAOD (  peripheral arterial occlusive disease) (HCC)   . Multiple wounds of skin   . Lower limb ischemia 09/08/2020  . Gangrene of right foot (HCC)   . Chronic low back pain 04/27/2020  . 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 Chronic back pain 12/29/2015  . Generalized anxiety disorder 12/29/2015  . PVD (peripheral vascular disease) (HCC) 05/09/2014  . 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 Hyponatremia 02/25/2013  . Essential hypertension 02/25/2013  . Pure hypercholesterolemia 02/25/2013   Past Medical History:  Diagnosis Date  . Chicken pox   . Claudication (HCC)   . Diabetes 1.5, managed as type 1 (HCC)   . Hypertension   . Low back pain   . PAD (peripheral artery disease) (HCC)   . Tobacco abuse     Family History  Problem Relation Age  of Onset  . Diabetes Mother   . Atrial fibrillation Mother   . Heart attack Father   . Stroke Father   . Arthritis Father   . Heart disease Father   . Diabetes Maternal Uncle   . Diabetes Maternal Grandmother   . Heart attack Maternal Grandmother   . Heart disease Maternal Grandmother   . Esophageal cancer Maternal Grandfather   . Heart attack Sister        at age 24    Past Surgical History:  Procedure Laterality Date  . AMPUTATION Right 09/18/2020   Procedure: RIGHT BELOW KNEE AMPUTATION;  Surgeon: Nadara Mustard, MD;  Location: [facility] OR;  Service: Orthopedics;  Laterality: Right;  . AORTA - BILATERAL FEMORAL ARTERY BYPASS GRAFT Bilateral 09/11/2020   Procedure: AORTA BIFEMORAL BYPASS GRAFT;  Surgeon: Larina Earthly, MD;  Location: [facility] OR;  Service: Vascular;  Laterality: Bilateral;  . CATARACT EXTRACTION Bilateral   . FEMORAL-POPLITEAL BYPASS GRAFT Right 09/11/2020   Procedure: RIGHT FEMORAL-POPLITEAL ARTERY BYPASS GRAFTING;  Surgeon: Larina Earthly, MD;  Location: [facility] OR;  Service: Vascular;  Laterality: Right;  . INTRAOCULAR LENS INSERTION Bilateral   . RETINAL DETACHMENT SURGERY Bilateral    Social History   Occupational History  . Not on file  Tobacco Use  . Smoking status: Current Every Day Smoker    Packs/day: 0.75    Years: 42.00    Pack years: 31.50    Types: Cigarettes  . Smokeless tobacco: Never Used  Vaping Use  . Vaping Use: Never used  Substance and Sexual Activity  . Alcohol use: No    Alcohol/week: 0.0 standard drinks  . Drug use: Not Currently  . Sexual activity: Not on file

## 2020-12-25 NOTE — Telephone Encounter (Signed)
I called to give approval for this and transportation asked for our cost center number. I do not know what that is? Do you?

## 2020-12-25 NOTE — Telephone Encounter (Signed)
Mo from [employer] called regarding the previous message she is requesting a call [phone]

## 2020-12-25 NOTE — Telephone Encounter (Signed)
I called and advised. 

## 2020-12-31 ENCOUNTER — Other Ambulatory Visit: Payer: Self-pay | Admitting: *Deleted

## 2020-12-31 DIAGNOSIS — Z95828 Presence of other vascular implants and grafts: Secondary | ICD-10-CM

## 2021-01-11 ENCOUNTER — Telehealth: Payer: Self-pay | Admitting: Orthopedic Surgery

## 2021-01-11 NOTE — Telephone Encounter (Signed)
Christian with [facility] transportation called and is needing to speak with autumn about pt and [facility].  CB [phone]

## 2021-01-11 NOTE — Telephone Encounter (Signed)
I called and sw Peter Mendez  to advise that this pt's transportation to [facility] ok to approve from our cost center.

## 2021-01-18 ENCOUNTER — Telehealth: Payer: Self-pay

## 2021-01-18 NOTE — Telephone Encounter (Signed)
I advised ok for [facility] transportation

## 2021-01-18 NOTE — Telephone Encounter (Signed)
Mo from [employer] called she is requesting transportation to be set up for the patient, the patient has a appointment there on Thursday 01/21/2021 at the [facility] call [phone]

## 2021-01-25 ENCOUNTER — Encounter: Payer: Self-pay | Admitting: Vascular Surgery

## 2021-01-25 ENCOUNTER — Other Ambulatory Visit: Payer: Self-pay

## 2021-01-25 ENCOUNTER — Ambulatory Visit (INDEPENDENT_AMBULATORY_CARE_PROVIDER_SITE_OTHER): Payer: Medicare Other

## 2021-01-25 ENCOUNTER — Ambulatory Visit (INDEPENDENT_AMBULATORY_CARE_PROVIDER_SITE_OTHER): Payer: Medicare Other | Admitting: Vascular Surgery

## 2021-01-25 VITALS — BP 180/81 | HR 56 | Temp 98.2°F | Resp 14 | Ht 71.0 in | Wt 135.0 lb

## 2021-01-25 DIAGNOSIS — Z95828 Presence of other vascular implants and grafts: Secondary | ICD-10-CM

## 2021-01-25 DIAGNOSIS — I779 Disorder of arteries and arterioles, unspecified: Secondary | ICD-10-CM

## 2021-01-25 NOTE — Progress Notes (Signed)
[facility] [place]  Patient name: Peter Mendez. MRN: [id number] DOB: 08/23/1960 Sex: male  REASON FOR VISIT: Follow-up aortobifem and right femoropopliteal bypass  HPI: Peter Mendez. is a 60 y.o. male here today for follow-up.  He looks quite good.  He has had a temporary below-knee prosthesis placed and is walking without the assistance of crutches.  He has had no abdominal difficulty.  Past Medical History:  Diagnosis Date   Chicken pox    Claudication (HCC)    Diabetes 1.5, managed as type 1 (HCC)    Hypertension    Low back pain    PAD (peripheral artery disease) (HCC)    Tobacco abuse     Family History  Problem Relation Age of Onset   Diabetes Mother    Atrial fibrillation Mother    Heart attack Father    Stroke Father    Arthritis Father    Heart disease Father    Diabetes Maternal Uncle    Diabetes Maternal Grandmother    Heart attack Maternal Grandmother    Heart disease Maternal Grandmother    Esophageal cancer Maternal Grandfather    Heart attack Sister        at age 35    SOCIAL HISTORY: Social History   Tobacco Use   Smoking status: Every Day    Packs/day: 0.75    Years: 42.00    Pack years: 31.50    Types: Cigarettes   Smokeless tobacco: Never  Substance Use Topics   Alcohol use: No    Alcohol/week: 0.0 standard drinks    Allergies  Allergen Reactions   Heparin Other (See Comments)    Heparin (SRA positive)   Pravastatin Sodium Other (See Comments)    myalgia    Current Outpatient Medications  Medication Sig Dispense Refill   acetaminophen (TYLENOL) 500 MG tablet Take 1,000 mg by mouth every 6 (six) hours as needed for mild pain or headache.     alprazolam (XANAX) 2 MG tablet take 1 tablet by mouth twice a day if needed for anxiety (Patient taking differently: Take 1 mg by mouth 3 (three) times daily as needed for anxiety.) 60 tablet 0   aspirin EC 81 MG EC tablet Take 1  tablet (81 mg total) by mouth daily. Swallow whole. 30 tablet 11   Continuous Blood Gluc Sensor (FREESTYLE LIBRE 14 DAY SENSOR) MISC 1 Units by Does not apply route every 14 (fourteen) days. 6 each 4   DROPLET PEN NEEDLES 31G X 8 MM MISC USE TO INJECT  INSULIN FIVE TIMES DAILY 500 each 1   fluconazole (DIFLUCAN) 150 MG tablet Take 1 tablet (150 mg total) by mouth once a week. 2 tablet 0   gabapentin (NEURONTIN) 300 MG capsule Take 1 capsule (300 mg total) by mouth 3 (three) times daily. 270 capsule 3   gabapentin (NEURONTIN) 300 MG capsule Take 1 capsule (300 mg total) by mouth 3 (three) times daily. 3 times a day when necessary neuropathy pain 90 capsule 3   insulin aspart (NOVOLOG FLEXPEN) 100 UNIT/ML FlexPen Inject 4-6 Units into the skin 3 (three) times daily with meals. 30 mL 3   insulin glargine (LANTUS SOLOSTAR) 100 UNIT/ML Solostar Pen INJECT 30 UNITS SUBCUTANEOUSLY IN THE MORNING AND 32 UNITS IN THE EVENING. 45 mL 1   Lancets (ONETOUCH ULTRASOFT) lancets Use as instructed to check blood sugars 7 times per day 210 each 3   omeprazole (PRILOSEC) 20 MG capsule  Take 20 mg by mouth daily.     oxyCODONE-acetaminophen (PERCOCET/ROXICET) 5-325 MG tablet TAKE ONE TABLET EVERY 8 HOURS 30 tablet 0   ramipril (ALTACE) 5 MG capsule TAKE 1 CAPSULE EVERY DAY (Patient taking differently: Take 5 mg by mouth daily.) 90 capsule 1   rosuvastatin (CRESTOR) 20 MG tablet Take 1 tablet (20 mg total) by mouth daily. 30 tablet 0   Rivaroxaban (XARELTO) 15 MG TABS tablet Take 1 tablet (15 mg total) by mouth 2 (two) times daily with a meal for 18 days. 36 tablet 0   rivaroxaban (XARELTO) 20 MG TABS tablet Take 1 tablet (20 mg total) by mouth daily with supper for 7 days. 7 tablet 0   No current facility-administered medications for this visit.    REVIEW OF SYSTEMS:  [X]  denotes positive finding, [ ]  denotes negative finding Cardiac  Comments:  Chest pain or chest pressure:    Shortness of breath upon exertion:     Short of breath when lying flat:    Irregular heart rhythm:        Vascular    Pain in calf, thigh, or hip brought on by ambulation:    Pain in feet at night that wakes you up from your sleep:     Blood clot in your veins:    Leg swelling:           PHYSICAL EXAM: Vitals:   01/25/21 1332  BP: (!) 180/81  Pulse: (!) 56  Resp: 14  Temp: 98.2 F (36.8 C)  TempSrc: Other (Comment)  SpO2: 98%  Weight: 135 lb (61.2 kg)  Height: 5\' 11"  (1.803 m)    GENERAL: The patient is a well-nourished male, in no acute distress. The vital signs are documented above. CARDIOVASCULAR: Abdominal incision completely healed.  No hernia.  2+ femoral pulses with healed incisions.  He has a 2+ right popliteal pulse.  His below-knee amputation is completely healed.  He does have a thin area over the prominence of his tibia.  [name] very thin in general.  The skin is intact PULMONARY: There is good air exchange  MUSCULOSKELETAL: There are no major deformities or cyanosis. NEUROLOGIC: No focal weakness or paresthesias are detected. SKIN: There are no ulcers or rashes noted. PSYCHIATRIC: The patient has a normal affect.  DATA:  None  MEDICAL ISSUES: Doing well from the standpoint of his aortobifem and right femoropopliteal bypass.  His right femoropopliteal bypasses stay patent despite his right below-knee amputation.  [name] continue to follow-up with [facility] prosthesis to get better fitting for his socket.  [name] to see Dr. for continued follow-up of his below-knee amputation.  He has questions regarding medication.  He was placed on Xarelto following postop A. fib event.  He does not have an appointment to see Dr. 01/27/21 regarding this.  We will coordinate this for him.  He will see me again on an as-needed basis    , MD FACS [facility] Office Tel [phone]  Note: Portions of this report may have been transcribed using voice recognition  software.  Every effort has been made to ensure accuracy; however, inadvertent computerized transcription errors may still be present.

## 2021-02-01 ENCOUNTER — Other Ambulatory Visit: Payer: Self-pay | Admitting: Endocrinology

## 2021-02-10 ENCOUNTER — Ambulatory Visit (INDEPENDENT_AMBULATORY_CARE_PROVIDER_SITE_OTHER): Payer: Medicare Other | Admitting: Physician Assistant

## 2021-02-10 ENCOUNTER — Encounter: Payer: Self-pay | Admitting: Physician Assistant

## 2021-02-10 DIAGNOSIS — Z89511 Acquired absence of right leg below knee: Secondary | ICD-10-CM

## 2021-02-10 DIAGNOSIS — S88111A Complete traumatic amputation at level between knee and ankle, right lower leg, initial encounter: Secondary | ICD-10-CM

## 2021-02-10 NOTE — Progress Notes (Signed)
Office Visit Note   Patient: Peter Mendez.           Date of Birth: 15-Sep-1960           MRN: [id number] Visit Date: 02/10/2021              Requested by: Leilani Able, MD [address] [place],  [place] [address] PCP: Leilani Able, MD  No chief complaint on file.     HPI: Patient is status post right below-knee amputation.  He recently obtained his prosthetic.  [name] working with Technical sales engineer to make minor adjustments.  He has not yet done physical therapy he is starting to ambulate and feels he is doing well   Assessment & Plan: Visit Diagnoses:  1. Below-knee amputation of right lower extremity (HCC)     Plan: Patient will follow-up for final visit in 2 months I will order physical therapy as I think that will help secure and refine his ambulation as well as ambulating on uneven surfaces  Follow-Up Instructions: No follow-ups on file.   Ortho Exam  Patient is alert, oriented, no adenopathy, well-dressed, normal affect, normal respiratory effort. Examination demonstrates well-healed amputation stump no swelling no erythema no cellulitis no signs of infection he has full extension and flexion of his knee he does ambulate quite well with his prosthetic  Imaging: No results found. No images are attached to the encounter.  Labs: Lab Results  Component Value Date   HGBA1C 8.8 (A) 07/21/2020   HGBA1C 9.3 (A) 04/28/2020   HGBA1C 8.7 (A) 01/21/2020   REPTSTATUS 09/13/2020 FINAL 09/08/2020   CULT  09/08/2020    NO GROWTH 5 DAYS Performed at [facility], [address], [place], [place] [address]      Lab Results  Component Value Date   ALBUMIN 2.1 (L) 09/21/2020   ALBUMIN 2.5 (L) 09/13/2020   ALBUMIN 2.8 (L) 09/12/2020    Lab Results  Component Value Date   MG 1.9 09/24/2020   MG 2.1 09/23/2020   MG 1.9 09/22/2020   No results found for: VD25OH  No results found for: PREALBUMIN CBC EXTENDED Latest Ref Rng & Units 09/25/2020 09/24/2020 09/23/2020  WBC 4.0  - 10.5 K/uL 8.9 9.9 10.7(H)  RBC 4.22 - 5.81 MIL/uL 2.71(L) 2.51(L) 2.72(L)  HGB 13.0 - 17.0 g/dL 8.1(L) 7.6(L) 8.3(L)  HCT 39.0 - 52.0 % 24.5(L) 22.1(L) 23.5(L)  PLT 150 - 400 K/uL 59(L) 57(L) 48(L)  NEUTROABS 1.7 - 7.7 K/uL - - -  LYMPHSABS 0.7 - 4.0 K/uL - - -     There is no height or weight on file to calculate BMI.  Orders:  Orders Placed This Encounter  Procedures   Ambulatory referral to Physical Therapy   No orders of the defined types were placed in this encounter.    Procedures: No procedures performed  Clinical Data: No additional findings.  ROS:  All other systems negative, except as noted in the HPI. Review of Systems  Objective: Vital Signs: There were no vitals taken for this visit.  Specialty Comments:  No specialty comments available.  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) 05/23/2016   Chronic back pain 12/29/2015   Generalized anxiety disorder 12/29/2015   PVD (peripheral vascular disease) (HCC) 05/09/2014   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   Hyponatremia 02/25/2013   Essential hypertension 02/25/2013   Pure hypercholesterolemia 02/25/2013   Past Medical History:  Diagnosis Date   Chicken pox    Claudication (HCC)    Diabetes 1.5, managed as type 1 (HCC)    Hypertension    Low back pain    PAD (peripheral artery disease) (HCC)    Tobacco abuse     Family History  Problem  Relation Age of Onset   Diabetes Mother    Atrial fibrillation Mother    Heart attack Father    Stroke Father    Arthritis Father    Heart disease Father    Diabetes Maternal Uncle    Diabetes Maternal Grandmother    Heart attack Maternal Grandmother    Heart disease Maternal Grandmother    Esophageal cancer Maternal Grandfather    Heart attack Sister        at age 53    Past Surgical History:  Procedure Laterality Date   AMPUTATION Right 09/18/2020   Procedure: RIGHT BELOW KNEE AMPUTATION;  Surgeon: Nadara Mustard, MD;  Location: [facility] OR;  Service: Orthopedics;  Laterality: Right;   AORTA - BILATERAL FEMORAL ARTERY BYPASS GRAFT Bilateral 09/11/2020   Procedure: AORTA BIFEMORAL BYPASS GRAFT;  Surgeon: Larina Earthly, MD;  Location: [facility] OR;  Service: Vascular;  Laterality: Bilateral;   CATARACT EXTRACTION Bilateral    FEMORAL-POPLITEAL BYPASS GRAFT Right 09/11/2020   Procedure: RIGHT FEMORAL-POPLITEAL ARTERY BYPASS GRAFTING;  Surgeon: Larina Earthly, MD;  Location: [facility] OR;  Service: Vascular;  Laterality: Right;   INTRAOCULAR LENS INSERTION Bilateral    RETINAL DETACHMENT SURGERY Bilateral    Social History   Occupational History   Not on file  Tobacco Use   Smoking status: Every Day    Packs/day: 0.75    Years: 42.00    Pack years: 31.50    Types: Cigarettes   Smokeless tobacco: Never  Vaping Use   Vaping Use: Never used  Substance and Sexual Activity   Alcohol use: No    Alcohol/week: 0.0 standard drinks   Drug use: Not Currently   Sexual activity: Not on file

## 2021-03-15 ENCOUNTER — Ambulatory Visit: Payer: Medicare Other | Attending: Family Medicine

## 2021-03-15 ENCOUNTER — Other Ambulatory Visit: Payer: Self-pay

## 2021-03-15 DIAGNOSIS — M6281 Muscle weakness (generalized): Secondary | ICD-10-CM | POA: Diagnosis present

## 2021-03-15 DIAGNOSIS — Z89511 Acquired absence of right leg below knee: Secondary | ICD-10-CM | POA: Insufficient documentation

## 2021-03-15 DIAGNOSIS — R2689 Other abnormalities of gait and mobility: Secondary | ICD-10-CM | POA: Diagnosis present

## 2021-03-15 NOTE — Therapy (Signed)
[facility] [address] [place], [place], [address] Phone: [phone]   Fax:  [phone]  Physical Therapy Evaluation  Patient Details  Name: Peter Mendez. MRN: [id number] Date of Birth: 10/24/1960 Referring Provider (PT): Chales Abrahams Persons, [place]   Encounter Date: 03/15/2021   PT End of Session - 03/15/21 1218     Visit Number 1    Number of Visits 17    Date for PT Re-Evaluation 05/10/21    Authorization Type Medicare    Progress Note Due on Visit 10    PT Start Time 1105    PT Stop Time 1150    PT Time Calculation (min) 45 min    Equipment Utilized During Treatment Gait belt    Activity Tolerance Patient tolerated treatment well    Behavior During Therapy WFL for tasks assessed/performed             Past Medical History:  Diagnosis Date   Chicken pox    Claudication (HCC)    Diabetes 1.5, managed as type 1 (HCC)    Hypertension    Low back pain    PAD (peripheral artery disease) (HCC)    Tobacco abuse     Past Surgical History:  Procedure Laterality Date   AMPUTATION Right 09/18/2020   Procedure: RIGHT BELOW KNEE AMPUTATION;  Surgeon: Nadara Mustard, MD;  Location: [facility] OR;  Service: Orthopedics;  Laterality: Right;   AORTA - BILATERAL FEMORAL ARTERY BYPASS GRAFT Bilateral 09/11/2020   Procedure: AORTA BIFEMORAL BYPASS GRAFT;  Surgeon: Larina Earthly, MD;  Location: [facility] OR;  Service: Vascular;  Laterality: Bilateral;   CATARACT EXTRACTION Bilateral    FEMORAL-POPLITEAL BYPASS GRAFT Right 09/11/2020   Procedure: RIGHT FEMORAL-POPLITEAL ARTERY BYPASS GRAFTING;  Surgeon: Larina Earthly, MD;  [facility] OR;  Service: Vascular;  Laterality: Right;   INTRAOCULAR LENS INSERTION Bilateral    RETINAL DETACHMENT SURGERY Bilateral     There were no vitals filed for this visit.    Subjective Assessment - 03/15/21 1100     Subjective Pt had R BKA on 09/18/20. Received his prosthetic from [employer] in 01/2021. Working  with Chief of Staff at [employer]. Currently wearing it for 6-7 hours a day. Currently ambulating without any AD.    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 No issues    How long can you stand comfortably? 15-20 min    How long can you walk comfortably? 10 min    Patient Stated Goals Improve balance    Currently in Pain? Yes    Pain Score 5     Pain Location Leg    Pain Orientation Right    Pain Descriptors / Indicators Burning    Pain Type Phantom pain;Neuropathic pain    Pain Onset More than a month ago    Pain Frequency Constant                [facility] PT Assessment - 03/15/21 1058       Assessment   Medical Diagnosis R BKA    Referring Provider (PT) Chales Abrahams Persons, PA    Onset Date/Surgical Date 09/18/20   R BKA     Precautions   Precautions Fall    Required Braces or Orthoses --   Requires Prosthetic on R LE     Restrictions   Weight Bearing Restrictions No  Functional Gait  Assessment   Gait assessed  Yes    Gait Level Surface Walks 20 ft in less than 7 sec but greater than 5.5 sec, uses assistive device, slower speed, mild gait deviations, or deviates 6-10 in outside of the 12 in walkway width.    Change in Gait Speed Able to change speed, demonstrates mild gait deviations, deviates 6-10 in outside of the 12 in walkway width, or no gait deviations, unable to achieve a major change in velocity, or uses a change in velocity, or uses an assistive device.    Gait with Horizontal Head Turns Performs head turns with moderate changes in gait velocity, slows down, deviates 10-15 in outside 12 in walkway width but recovers, can continue to walk.    Gait with Vertical Head Turns Performs task with slight change in gait velocity (eg, minor disruption to smooth gait path), deviates 6 - 10 in outside 12 in walkway width or uses assistive device    Gait and Pivot Turn Turns slowly, requires verbal  cueing, or requires several small steps to catch balance following turn and stop    Step Over Obstacle Is able to step over one shoe box (4.5 in total height) but must slow down and adjust steps to clear box safely. May require verbal cueing.    Gait with Narrow Base of Support Ambulates less than 4 steps heel to toe or cannot perform without assistance.    Gait with Eyes Closed Walks 20 ft, uses assistive device, slower speed, mild gait deviations, deviates 6-10 in outside 12 in walkway width. Ambulates 20 ft in less than 9 sec but greater than 7 sec.    Ambulating Backwards Walks 20 ft, slow speed, abnormal gait pattern, evidence for imbalance, deviates 10-15 in outside 12 in walkway width.    Steps Alternating feet, must use rail.    Total Score 14    FGA comment: 14/30            Patient education: Wear time: Pt educated on gradually increasing wear time from 6 hours. Adding 1 hour per week. Patient educated on performing skin checks often with mirror with help of fiance to look for any excessive redness or blisters. Pt's prosthesis is rubbing over medial and lateral condyles and over patella. Pt and finace educated to call Meghan from [employer] to get that adjusted to prevent skin break down.  Sock management: Pt educated to carry a bag with different ply socks and 2 clean wash clothes in zip lock bag. Pt educated to take leg off few times a day to wipe off the sweat to keep skin and liner as dry as possible. Pt educated that his leg with shrink with increased wear time and he will need to wear more socks to improve fit of leg inside the prosthetic socket.  Pt educated on having 6-8 clicks when he puts his leg inside the socket for better fit.   Pt educated on only using lotion at night and wiping it off in the morning. Putting deodrant on skin to manage sweat.  Pt educated on cleaning leg and liner with mild soat and water and wiping socket with antibacterial wipes at night to maintain  clean prosthetic components.  Sweating increases with an amputation. Your body is trying to regulate your temperature & without an extremity, you sweat more easily to cool off. Also prosthetic material like liners do not breath and add hot layers which causes even more sweating. With time your body  typically will accommodate to prosthesis and your sweat level will come closer to level with amputation but not pre-amputation level.   You need to pat your limb & liner dry when you notice sweating. If you leave sweat trapped inside your liner, then it can result in a blister.   Signs of sweating in your liner: You are sweating elsewhere on your body or you notice sweat running / dripping.  Take note of how high your liner comes up on your limb when you first put your liner on your limb. If you notice that your liner has slipped down, then you probably have sweat inside your liner. A good time to check for liner slippage is when toileting.  You feel air bubbles inside your liner. When you liner slips, then air is allowed in bottom. As you put weight on prosthesis, the air is burp or pushed out. You feel something crawling or moving inside your liner. When sweat runs inside the closed system of liner, it often feels like a bug or something crawling inside your liner.  If any of above symptoms are noted, you need to remove your prosthesis & liner to pat your limb & liner dry. This is permanent need as leaving sweat or water trapped can result in a blister or wound.                 PT Education - 03/15/21 1223     Education Details See treatment note    Person(s) Educated Patient;Other (comment)   Fiance Paula   Methods Explanation    Comprehension Verbalized understanding              PT Short Term Goals - 03/15/21 1225       PT SHORT TERM GOAL #1   Title Pt will demo 18/30 on FGA to improve functional balanec with ambulation    Baseline 14/30 (03/15/21)    Time 4    Period Weeks     Status New    Target Date 04/12/21      PT SHORT TERM GOAL #2   Title Pt will return verbal demonstration on how to properly care for prosthetic components and residual leg with visual and tactile observations with assist of his fiance (due to visual impairments)    Baseline Needs further education (03/15/21)    Time 4    Period Weeks    Status New    Target Date 04/12/21      PT SHORT TERM GOAL #3   Title Pt will be able to negotiate curb and ramp with SBA and with or without appropriate AD to improve community negotiation    Baseline Not attempted    Time 4    Period Weeks    Status New    Target Date 05/10/21               PT Long Term Goals - 03/15/21 1227       PT LONG TERM GOAL #1   Title Pt will be able to ambulate on non compliant surface with or without use of appropriate AD for 200 feet with SBA to improve functional ambulation in community    Baseline Not attempted    Time 8    Period Weeks    Status New    Target Date 05/10/21      PT LONG TERM GOAL #2   Title Patient will demo score of >20/30 on FGA to improve functional ambulation and reduce fall  Baseline 14/30    Time 8    Period Weeks    Status New    Target Date 05/10/21      PT LONG TERM GOAL #3   Title Pt will be able to ambulate >1000 feet with SBA with or without AD to safely navigate community    Baseline Uses wheelchair in community, no AD at home    Time 8    Period Weeks    Status New    Target Date 05/10/21                    Plan - 03/15/21 1231     Clinical Impression Statement Patient is a 60 y.o. male who was seen today for physical therapy evaluation and treatment for gait and mobility disorder after R BKA on 09/2020. Patient has received his prosthetic leg >3 months ago and currently using it for 6-7 hours a day. Patient is able to ambulate for in home mobility without AD and holding on to wall and furniture for balance and stability. Patient demonstrated 14/30 on  Functional gait assessment which demonstrates significant balance impairments with functional mobility. Patient also has significant visual impairments that is limiting his functional gait and balance. Patient requires further education for prosthetic management and residual limb care for improved pain management and independence. Pt will benefit from skilled PT    Personal Factors and Comorbidities Age;Comorbidity 3+;Past/Current Experience;Time since onset of injury/illness/exacerbation;Transportation    Comorbidities DM, R BKA, visual impariemnts    Examination-Activity Limitations Caring for Others;Carry;Hygiene/Grooming;Squat;Stairs;Stand;Transfers    Examination-Participation Restrictions Church;Cleaning;Community Activity;Laundry;Shop;Yard Work    Stability/Clinical Decision Making Stable/Uncomplicated    Clinical Decision Making Moderate    Rehab Potential Good    PT Frequency 2x / week    PT Duration 8 weeks    PT Treatment/Interventions ADLs/Self Care Home Management;Cryotherapy;Moist Heat;Gait training;Stair training;Functional mobility training;Therapeutic activities;Therapeutic exercise;Balance training;Manual techniques;Prosthetic Training;Orthotic Fit/Training;Patient/family education;Neuromuscular re-education;Scar mobilization;Passive range of motion;Energy conservation;Visual/perceptual remediation/compensation;Joint Manipulations    PT Next Visit Plan Perform Modified CTSIB and address LTG; Review prosthetic care, Issue HEP    Consulted and Agree with Plan of Care Patient;Family member/caregiver    Family Member Consulted Faince [place])             Patient will benefit from skilled therapeutic intervention in order to improve the following deficits and impairments:  Abnormal gait, Decreased activity tolerance, Decreased balance, Decreased coordination, Decreased range of motion, Decreased mobility, Decreased endurance, Decreased skin integrity, Decreased strength, Difficulty  walking, Increased edema, Increased fascial restricitons, Impaired flexibility, Impaired vision/preception, Improper body mechanics, Postural dysfunction, Prosthetic Dependency  Visit Diagnosis: Hx of BKA, right (HCC)  Other abnormalities of gait and mobility  Muscle weakness (generalized)     Problem List Patient Active Problem List   Diagnosis Date Noted   IDA (iron deficiency anemia) 09/24/2020   Tinea cruris    HIT (heparin-induced thrombocytopenia) (HCC)    Pain of right lower extremity    Atrial fibrillation (HCC)    Typical atrial flutter (HCC)    Pressure injury of skin 09/13/2020   PAOD (peripheral arterial occlusive disease) (HCC)    Multiple wounds of skin    Lower limb ischemia 09/08/2020   Gangrene of right foot (HCC)    Chronic low back pain 04/27/2020   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 MRN: [id number] Date of Birth: [birth year]

## 2021-03-23 ENCOUNTER — Ambulatory Visit: Payer: Medicare Other

## 2021-03-26 ENCOUNTER — Other Ambulatory Visit: Payer: Self-pay

## 2021-03-26 ENCOUNTER — Ambulatory Visit: Payer: Medicare Other

## 2021-03-26 DIAGNOSIS — M6281 Muscle weakness (generalized): Secondary | ICD-10-CM

## 2021-03-26 DIAGNOSIS — R2689 Other abnormalities of gait and mobility: Secondary | ICD-10-CM

## 2021-03-26 DIAGNOSIS — Z89511 Acquired absence of right leg below knee: Secondary | ICD-10-CM | POA: Diagnosis not present

## 2021-03-26 NOTE — Therapy (Signed)
[facility] [address] [place], [place], [address] Phone: [phone]   Fax:  [phone]  Physical Therapy Treatment  Patient Details  Name: Peter Newnessarl T Odenthal Jr. MRN: [id number] Date of Birth: 10/09/1960 Referring Provider (PT): Chales AbrahamsMary Ann Persons, [place]   Encounter Date: 03/26/2021   PT End of Session - 03/26/21 1004     Visit Number 2    Number of Visits 17    Date for PT Re-Evaluation 05/10/21    Authorization Type Medicare    Progress Note Due on Visit 10    PT Start Time 1000    PT Stop Time 1050    PT Time Calculation (min) 50 min    Equipment Utilized During Treatment Gait belt    Activity Tolerance Patient tolerated treatment well    Behavior During Therapy WFL for tasks assessed/performed             Past Medical History:  Diagnosis Date   Chicken pox    Claudication (HCC)    Diabetes 1.5, managed as type 1 (HCC)    Hypertension    Low back pain    PAD (peripheral artery disease) (HCC)    Tobacco abuse     Past Surgical History:  Procedure Laterality Date   AMPUTATION Right 09/18/2020   Procedure: RIGHT BELOW KNEE AMPUTATION;  Surgeon: Nadara Mustarduda, Marcus V, MD;  [facility];  Service: Orthopedics;  Laterality: Right;   AORTA - BILATERAL FEMORAL ARTERY BYPASS GRAFT Bilateral 09/11/2020   Procedure: AORTA BIFEMORAL BYPASS GRAFT;  Surgeon: Larina EarthlyEarly, Todd F, MD;  [facility];  Service: Vascular;  Laterality: Bilateral;   CATARACT EXTRACTION Bilateral    FEMORAL-POPLITEAL BYPASS GRAFT Right 09/11/2020   Procedure: RIGHT FEMORAL-POPLITEAL ARTERY BYPASS GRAFTING;  Surgeon: Larina EarthlyEarly, Todd F, MD;  Location: [facility] OR;  Service: Vascular;  Laterality: Right;   INTRAOCULAR LENS INSERTION Bilateral    RETINAL DETACHMENT SURGERY Bilateral     There were no vitals filed for this visit.   Subjective Assessment - 03/26/21 1004     Subjective Pt reports that he is wearing leg about 7 hours a day. Does take off to pat dry  periodically as needed. Walking without AD but does take blind stick when goes outside on own.    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 No issues    How long can you stand comfortably? 15-20 min    How long can you walk comfortably? 10 min    Patient Stated Goals Improve balance    Currently in Pain? No/denies    Pain Onset More than a month ago                [facility] - 03/26/21 1007       ROM / Strength   AROM / PROM / Strength Strength      Strength   Strength Assessment Site Hip;Knee    Right/Left Hip Right    Right Hip Flexion 5/5    Right/Left Knee Right;Left    Right Knee Flexion 4/5    Right Knee Extension 5/5                           [facility] Adult PT Treatment/Exercise - 03/26/21 1007       Ambulation/Gait   Ambulation/Gait Yes    Ambulation/Gait Assistance 5: Supervision;4:  Min guard    Ambulation/Gait Assistance Details Verbal cues to let know about surface changes due to poor vision. Pt uses blind cane when walks outside at home. Reports walking about 30-45 minutes 3x/day. 178/82 BP after gait, HR=64 and regular. After 5 minutes BP=168/82    Ambulation Distance (Feet) 800 Feet    Assistive device Prosthesis    Gait Pattern Step-through pattern      High Level Balance   High Level Balance Comments Modified CTSIB 30 sec in all positions.      Prosthetics   Prosthetic Care Comments  Pt was educated on the following: increasing wear time to 4 hours 2x/day with removing in between as needed to pat leg dry if sweating. Discussed sweat management with antipersirant at night time to allow to sink in over night. Pt was wearing gauze over scab on distal tibia and PT advised not to do that. Applied tegaderm and gave them a couple extra in case it opened to wear when he was wearing liner. Discussed keeping extra socks with him to allow for adjustments as  needed during the day with fluid changes. Discussed ways to tell he needs more socks-too much distal pressure, patella pain/pressure,too many clicks when donning. Pt was also instructed in how to hold pin when donning prosthesis to be sure it is lined up and liner fully inverted before donning. Pt has silicon liner with pin lock suspension.    Current prosthetic wear tolerance (days/week)  daily    Current prosthetic wear tolerance (#hours/day)  7 hours with removing as needed to pat dry for sweating.    Edema none    Residual limb condition  slight red spot at distal tibia with white scab present    Education Provided Skin check;Residual limb care;Correct ply sock adjustment;Proper Donning;Proper Doffing;Proper wear schedule/adjustment    Person(s) Educated Patient;Other (comment)   friend   Education Method Explanation;Demonstration    Education Method Verbalized understanding    Donning Prosthesis Minimal assist    Doffing Prosthesis Independent                    PT Education - 03/26/21 1424     Education Details prosthetic education see that section    Person(s) Educated Patient;Other (comment)   friend   Methods Explanation    Comprehension Verbalized understanding              PT Short Term Goals - 03/15/21 1225       PT SHORT TERM GOAL #1   Title Pt will demo 18/30 on FGA to improve functional balanec with ambulation    Baseline 14/30 (03/15/21)    Time 4    Period Weeks    Status New    Target Date 04/12/21      PT SHORT TERM GOAL #2   Title Pt will return verbal demonstration on how to properly care for prosthetic components and residual leg with visual and tactile observations with assist of his fiance (due to visual impairments)    Baseline Needs further education (03/15/21)    Time 4    Period Weeks    Status New    Target Date 04/12/21      PT SHORT TERM GOAL #3   Title Pt will be able to negotiate curb and ramp with SBA and with or without  appropriate AD to improve community negotiation    Baseline Not attempted    Time 4    Period Weeks  Status New    Target Date 05/10/21               PT Long Term Goals - 03/15/21 1227       PT LONG TERM GOAL #1   Title Pt will be able to ambulate on non compliant surface with or without use of appropriate AD for 200 feet with SBA to improve functional ambulation in community    Baseline Not attempted    Time 8    Period Weeks    Status New    Target Date 05/10/21      PT LONG TERM GOAL #2   Title Patient will demo score of >20/30 on FGA to improve functional ambulation and reduce fall    Baseline 14/30    Time 8    Period Weeks    Status New    Target Date 05/10/21      PT LONG TERM GOAL #3   Title Pt will be able to ambulate >1000 feet with SBA with or without AD to safely navigate community    Baseline Uses wheelchair in community, no AD at home    Time 8    Period Weeks    Status New    Target Date 05/10/21                   Plan - 03/26/21 1425     Clinical Impression Statement PT further assessed pt today. Good ROM at right knee with full extension. Strength 4 to 5/5 at right knee and hip. Pt able to hold all 4 conditions of modified CTSIB for >30sec. [name] most limited due to poor vision needed some cuing on nonlevel surfaces and for direction. Pt was less steady on grass. BP was elevated some at end of session.    Personal Factors and Comorbidities Age;Comorbidity 3+;Past/Current Experience;Time since onset of injury/illness/exacerbation;Transportation    Comorbidities DM, R BKA, visual impariemnts    Examination-Activity Limitations Caring for Others;Carry;Hygiene/Grooming;Squat;Stairs;Stand;Transfers    Examination-Participation Restrictions Church;Cleaning;Community Activity;Laundry;Shop;Yard Work    Stability/Clinical Decision Making Stable/Uncomplicated    Rehab Potential Good    PT Frequency 2x / week    PT Duration 8 weeks    PT  Treatment/Interventions ADLs/Self Care Home Management;Cryotherapy;Moist Heat;Gait training;Stair training;Functional mobility training;Therapeutic activities;Therapeutic exercise;Balance training;Manual techniques;Prosthetic Training;Orthotic Fit/Training;Patient/family education;Neuromuscular re-education;Scar mobilization;Passive range of motion;Energy conservation;Visual/perceptual remediation/compensation;Joint Manipulations    PT Next Visit Plan Prosthetic education review as needed and monitor distal tibia as had some redness present with scab. Gait without AD on mat/varied surfaces, balance on compliant surfaces. Pt is legally blind. Issue initial HEP for hip strengthening and balance. Monitor BP as was elevated at end of session.    Consulted and Agree with Plan of Care Patient;Family member/caregiver    Family Member Consulted Faince [place])             Patient will benefit from skilled therapeutic intervention in order to improve the following deficits and impairments:  Abnormal gait, Decreased activity tolerance, Decreased balance, Decreased coordination, Decreased range of motion, Decreased mobility, Decreased endurance, Decreased skin integrity, Decreased strength, Difficulty walking, Increased edema, Increased fascial restricitons, Impaired flexibility, Impaired vision/preception, Improper body mechanics, Postural dysfunction, Prosthetic Dependency  Visit Diagnosis: Other abnormalities of gait and mobility  Muscle weakness (generalized)     Problem List Patient Active Problem List   Diagnosis Date Noted   IDA (iron deficiency anemia) 09/24/2020   Tinea cruris    HIT (heparin-induced thrombocytopenia) (HCC)    Pain  of right lower extremity    Atrial fibrillation (HCC)    Typical atrial flutter (HCC)    Pressure injury of skin 09/13/2020   PAOD (peripheral arterial occlusive disease) (HCC)    Multiple wounds of skin    Lower limb ischemia 09/08/2020   Gangrene of right  foot (HCC)    Chronic low back pain 04/27/2020   Diabetic peripheral neuropathy associated with type 1 diabetes mellitus (HCC) 05/23/2016   Chronic back pain 12/29/2015   Generalized anxiety disorder 12/29/2015   PVD (peripheral vascular disease) (HCC) 05/09/2014   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 MRN: [id number] Date of Birth: 1960-12-09

## 2021-03-29 ENCOUNTER — Encounter: Payer: Self-pay | Admitting: Physical Therapy

## 2021-03-29 ENCOUNTER — Ambulatory Visit: Payer: Medicare Other | Admitting: Physical Therapy

## 2021-03-29 ENCOUNTER — Other Ambulatory Visit: Payer: Self-pay

## 2021-03-29 DIAGNOSIS — Z89511 Acquired absence of right leg below knee: Secondary | ICD-10-CM

## 2021-03-29 DIAGNOSIS — R2689 Other abnormalities of gait and mobility: Secondary | ICD-10-CM

## 2021-03-29 DIAGNOSIS — M6281 Muscle weakness (generalized): Secondary | ICD-10-CM

## 2021-03-29 NOTE — Therapy (Signed)
[facility] [address] [place], [place], [address] Phone: [phone]   Fax:  [phone]  Physical Therapy Treatment  Patient Details  Name: Peter Mendez. MRN: [id number] Date of Birth: July 13, 1961 Referring Provider (PT): Chales Abrahams Persons, [place]   Encounter Date: 03/29/2021   PT End of Session - 03/29/21 1147     Visit Number 3    Number of Visits 17    Date for PT Re-Evaluation 05/10/21    Authorization Type Medicare    Progress Note Due on Visit 10    PT Start Time 0930    PT Stop Time 1018    PT Time Calculation (min) 48 min    Equipment Utilized During Treatment Gait belt    Activity Tolerance Patient tolerated treatment well    Behavior During Therapy WFL for tasks assessed/performed             Past Medical History:  Diagnosis Date   Chicken pox    Claudication (HCC)    Diabetes 1.5, managed as type 1 (HCC)    Hypertension    Low back pain    PAD (peripheral artery disease) (HCC)    Tobacco abuse     Past Surgical History:  Procedure Laterality Date   AMPUTATION Right 09/18/2020   Procedure: RIGHT BELOW KNEE AMPUTATION;  Surgeon: Nadara Mustard, MD;  Location: [facility] OR;  Service: Orthopedics;  Laterality: Right;   AORTA - BILATERAL FEMORAL ARTERY BYPASS GRAFT Bilateral 09/11/2020   Procedure: AORTA BIFEMORAL BYPASS GRAFT;  Surgeon: Larina Earthly, MD;  Location: [facility] OR;  Service: Vascular;  Laterality: Bilateral;   CATARACT EXTRACTION Bilateral    FEMORAL-POPLITEAL BYPASS GRAFT Right 09/11/2020   Procedure: RIGHT FEMORAL-POPLITEAL ARTERY BYPASS GRAFTING;  Surgeon: Larina Earthly, MD;  Location: [facility] OR;  Service: Vascular;  Laterality: Right;   INTRAOCULAR LENS INSERTION Bilateral    RETINAL DETACHMENT SURGERY Bilateral     There were no vitals filed for this visit.   Subjective Assessment - 03/29/21 0930     Subjective Fiance stated that today is the first time he has come to therapy without a w/c.  Pt reports only using RW when he doesn't have prothesis on. Blind cane is bent and pt needs to get new one.    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 No issues    How long can you stand comfortably? 15-20 min    How long can you walk comfortably? 10 min    Patient Stated Goals Improve balance    Currently in Pain? No/denies    Pain Onset More than a month ago                  [facility] Adult PT Treatment/Exercise - 03/29/21 0001       Prosthetics   Prosthetic Care Comments  Reviewed residual limb care: washing, skin check, sweat management    Current prosthetic wear tolerance (days/week)  daily    Current prosthetic wear tolerance (#hours/day)  7 hours with removing as needed to pat dry for sweating.    Edema none    Residual limb condition  slight red spot at distal tibia with white scab present   tegaderm over red spot. no broken skin.   Education Provided Skin check;Residual limb care;Correct ply sock adjustment;Proper Donning;Proper Doffing;Proper wear schedule/adjustment    Person(s) Educated Patient;Other (comment)  fiance   Education Method Explanation;Demonstration;Verbal cues    Education Method Verbalized understanding;Returned demonstration    Donning Prosthesis Supervision   cues for lining up pin.   Doffing Prosthesis Independent            Pt performed exercises below without UE support.  Working on [employer] for midline and weight shifting especially over R LE with cues for posture and body mechanics.  Do each exercise 1-2 times per day Do each exercise 5 repetitions Hold each exercise for 5 seconds to feel your location  AT [facility] FIND YOUR MIDLINE POSITION AND PLACE FEET EQUAL DISTANCE FROM THE MIDLINE.  USE TAPE ON FLOOR TO MARK THE MIDLINE POSITION. You also should try to feel with your limb pressure in socket.  You are trying to feel with limb what you used to feel with  the bottom of your foot.  Side to Side Shift: Moving your hips only (not shoulders): move weight onto your left leg, HOLD/FEEL.  Move back to equal weight on each leg, HOLD/FEEL. Move weight onto your right leg, HOLD/FEEL. Move back to equal weight on each leg, HOLD/FEEL. Repeat. Front to Back Shift: Moving your hips only (not shoulders): move your weight forward onto your toes, HOLD/FEEL. Move your weight back to equal Flat Foot on both legs, HOLD/FEEL. Move your weight back onto your heels, HOLD/FEEL. Move your weight back to equal on both legs, HOLD/FEEL. Repeat. Moving Cones / Cups: With equal weight on each leg: Hold on with one hand the first time, then progress to no hand supports. Move cups from one side of sink to the other. Place cups ~2" out of your reach, progress to 10" beyond reach. Overhead/Upward Reaching: alternated reaching up to top cabinets or ceiling if no cabinets present. Keep equal weight on each leg. Start with one hand support on counter while other hand reaches and progress to no hand support with reaching. 5.   Looking Over Shoulders: With equal weight on each leg: alternate turning to look          over your shoulders with one hand support on counter as needed. Shift weight to             side looking, pull hip then shoulder then head/eyes around to look behind you. Start       with one hand support & progress to no hand support.        PT Education - 03/29/21 1146     Education Details HEP- standing at the sink working midline, posture, and weight shifts.    Person(s) Educated Patient;Spouse    Methods Explanation;Demonstration;Tactile cues;Verbal cues;Handout    Comprehension Verbalized understanding;Returned demonstration;Verbal cues required              PT Short Term Goals - 03/15/21 1225       PT SHORT TERM GOAL #1   Title Pt will demo 18/30 on FGA to improve functional balanec with ambulation    Baseline 14/30 (03/15/21)    Time 4    Period Weeks     Status New    Target Date 04/12/21      PT SHORT TERM GOAL #2   Title Pt will return verbal demonstration on how to properly care for prosthetic components and residual leg with visual and tactile observations with assist of his fiance (due to visual impairments)    Baseline Needs further education (03/15/21)    Time 4    Period Weeks  Status New    Target Date 04/12/21      PT SHORT TERM GOAL #3   Title Pt will be able to negotiate curb and ramp with SBA and with or without appropriate AD to improve community negotiation    Baseline Not attempted    Time 4    Period Weeks    Status New    Target Date 05/10/21               PT Long Term Goals - 03/15/21 1227       PT LONG TERM GOAL #1   Title Pt will be able to ambulate on non compliant surface with or without use of appropriate AD for 200 feet with SBA to improve functional ambulation in community    Baseline Not attempted    Time 8    Period Weeks    Status New    Target Date 05/10/21      PT LONG TERM GOAL #2   Title Patient will demo score of >20/30 on FGA to improve functional ambulation and reduce fall    Baseline 14/30    Time 8    Period Weeks    Status New    Target Date 05/10/21      PT LONG TERM GOAL #3   Title Pt will be able to ambulate >1000 feet with SBA with or without AD to safely navigate community    Baseline Uses wheelchair in community, no AD at home    Time 8    Period Weeks    Status New    Target Date 05/10/21                   Plan - 03/29/21 1148     Clinical Impression Statement Pt is very motivated to progress with prosthetic training and is progressing with residual limb and prosthetic management at supervision level.    Personal Factors and Comorbidities Age;Comorbidity 3+;Past/Current Experience;Time since onset of injury/illness/exacerbation;Transportation    Comorbidities DM, R BKA, visual impariemnts    Examination-Activity Limitations Caring for  Others;Carry;Hygiene/Grooming;Squat;Stairs;Stand;Transfers    Examination-Participation Restrictions Church;Cleaning;Community Activity;Laundry;Shop;Yard Work    Stability/Clinical Decision Making Stable/Uncomplicated    Rehab Potential Good    PT Frequency 2x / week    PT Duration 8 weeks    PT Treatment/Interventions ADLs/Self Care Home Management;Cryotherapy;Moist Heat;Gait training;Stair training;Functional mobility training;Therapeutic activities;Therapeutic exercise;Balance training;Manual techniques;Prosthetic Training;Orthotic Fit/Training;Patient/family education;Neuromuscular re-education;Scar mobilization;Passive range of motion;Energy conservation;Visual/perceptual remediation/compensation;Joint Manipulations    PT Next Visit Plan check HEP issued 03/29/21. Prosthetic education review as needed and monitor distal tibia as had some redness present with scab. Gait without AD on mat/varied surfaces, balance on compliant surfaces. Pt is legally blind. Issue initial HEP for hip strengthening and balance. Monitor BP as was elevated at end of session.    Consulted and Agree with Plan of Care Patient;Family member/caregiver    Family Member Consulted Faince [place])             Patient will benefit from skilled therapeutic intervention in order to improve the following deficits and impairments:  Abnormal gait, Decreased activity tolerance, Decreased balance, Decreased coordination, Decreased range of motion, Decreased mobility, Decreased endurance, Decreased skin integrity, Decreased strength, Difficulty walking, Increased edema, Increased fascial restricitons, Impaired flexibility, Impaired vision/preception, Improper body mechanics, Postural dysfunction, Prosthetic Dependency  Visit Diagnosis: Other abnormalities of gait and mobility  Muscle weakness (generalized)  Hx of BKA, right [facility](HCC)     Problem List Patient Active Problem List  Diagnosis Date Noted   IDA (iron deficiency  anemia) 09/24/2020   Tinea cruris    HIT (heparin-induced thrombocytopenia) (HCC)    Pain of right lower extremity    Atrial fibrillation (HCC)    Typical atrial flutter (HCC)    Pressure injury of skin 09/13/2020   PAOD (peripheral arterial occlusive disease) (HCC)    Multiple wounds of skin    Lower limb ischemia 09/08/2020   Gangrene of right foot (HCC)    Chronic low back pain 04/27/2020   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 [place], [place], [address] Phone: [phone]   Fax:  [phone]  Name: Peter Mendez. MRN: [id number] Date of Birth: Sep 18, 1960

## 2021-03-31 ENCOUNTER — Other Ambulatory Visit: Payer: Self-pay

## 2021-03-31 ENCOUNTER — Ambulatory Visit: Payer: Medicare Other

## 2021-03-31 DIAGNOSIS — Z89511 Acquired absence of right leg below knee: Secondary | ICD-10-CM

## 2021-03-31 DIAGNOSIS — M6281 Muscle weakness (generalized): Secondary | ICD-10-CM

## 2021-03-31 DIAGNOSIS — R2689 Other abnormalities of gait and mobility: Secondary | ICD-10-CM

## 2021-03-31 NOTE — Therapy (Signed)
[facility] [address] [place], [place], [address] Phone: [phone]   Fax:  [phone]  Physical Therapy Treatment  Patient Details  Name: Peter Mendez. MRN: [id number] Date of Birth: 10/26/1960 Referring Provider (PT): Chales Abrahams Persons, [place]   Encounter Date: 03/31/2021   PT End of Session - 03/31/21 0939     Visit Number 4    Number of Visits 17    Date for PT Re-Evaluation 05/10/21    Authorization Type Medicare    Progress Note Due on Visit 10    PT Start Time 0930    PT Stop Time 1015    PT Time Calculation (min) 45 min    Equipment Utilized During Treatment Gait belt    Activity Tolerance Patient tolerated treatment well    Behavior During Therapy WFL for tasks assessed/performed             Past Medical History:  Diagnosis Date   Chicken pox    Claudication (HCC)    Diabetes 1.5, managed as type 1 (HCC)    Hypertension    Low back pain    PAD (peripheral artery disease) (HCC)    Tobacco abuse     Past Surgical History:  Procedure Laterality Date   AMPUTATION Right 09/18/2020   Procedure: RIGHT BELOW KNEE AMPUTATION;  Surgeon: Nadara Mustard, MD;  Location: [facility] OR;  Service: Orthopedics;  Laterality: Right;   AORTA - BILATERAL FEMORAL ARTERY BYPASS GRAFT Bilateral 09/11/2020   Procedure: AORTA BIFEMORAL BYPASS GRAFT;  Surgeon: Larina Earthly, MD;  Location: [facility] OR;  Service: Vascular;  Laterality: Bilateral;   CATARACT EXTRACTION Bilateral    FEMORAL-POPLITEAL BYPASS GRAFT Right 09/11/2020   Procedure: RIGHT FEMORAL-POPLITEAL ARTERY BYPASS GRAFTING;  Surgeon: Larina Earthly, MD;  Location: [facility] OR;  Service: Vascular;  Laterality: Right;   INTRAOCULAR LENS INSERTION Bilateral    RETINAL DETACHMENT SURGERY Bilateral     There were no vitals filed for this visit.   Subjective Assessment - 03/31/21 0941     Subjective He hopes to get a new bline cane next week.    Pertinent History R BKA 09/18/20;  legally blind    Limitations Walking;House hold activities    How long can you sit comfortably? No issues    How long can you stand comfortably? 15-20 min    How long can you walk comfortably? 10 min    Patient Stated Goals Improve balance    Currently in Pain? No/denies    Pain Onset More than a month ago             Towel desensitization over distal residual limb for 8'. Pt educated on performing this at night at least and through the day as needed. Reapplied tagaderm over anterior knee after towel desensitization Reviewed sock management again.   Gait training: 1 x 230' without AD and CGA Walking fwd and bwd: without AD 20 feet 5x Walking laterally: 3 x 10 steps R and L Fwd step up: 6" box: 10x R and L Lateral step down: 6" box: 10x R and L Standing deadlift: 15lbs from floor: 10x, cues to bend knees and shift weight equally 10x                          PT Short Term Goals - 03/15/21 1225       PT SHORT TERM GOAL #1   Title Pt will demo  18/30 on FGA to improve functional balanec with ambulation    Baseline 14/30 (03/15/21)    Time 4    Period Weeks    Status New    Target Date 04/12/21      PT SHORT TERM GOAL #2   Title Pt will return verbal demonstration on how to properly care for prosthetic components and residual leg with visual and tactile observations with assist of his fiance (due to visual impairments)    Baseline Needs further education (03/15/21)    Time 4    Period Weeks    Status New    Target Date 04/12/21      PT SHORT TERM GOAL #3   Title Pt will be able to negotiate curb and ramp with SBA and with or without appropriate AD to improve community negotiation    Baseline Not attempted    Time 4    Period Weeks    Status New    Target Date 05/10/21               PT Long Term Goals - 03/15/21 1227       PT LONG TERM GOAL #1   Title Pt will be able to ambulate on non compliant surface with or without use of appropriate AD  for 200 feet with SBA to improve functional ambulation in community    Baseline Not attempted    Time 8    Period Weeks    Status New    Target Date 05/10/21      PT LONG TERM GOAL #2   Title Patient will demo score of >20/30 on FGA to improve functional ambulation and reduce fall    Baseline 14/30    Time 8    Period Weeks    Status New    Target Date 05/10/21      PT LONG TERM GOAL #3   Title Pt will be able to ambulate >1000 feet with SBA with or without AD to safely navigate community    Baseline Uses wheelchair in community, no AD at home    Time 8    Period Weeks    Status New    Target Date 05/10/21                    Patient will benefit from skilled therapeutic intervention in order to improve the following deficits and impairments:     Visit Diagnosis: Other abnormalities of gait and mobility  Muscle weakness (generalized)  Hx of BKA, right (HCC)     Problem List Patient Active Problem List   Diagnosis Date Noted   IDA (iron deficiency anemia) 09/24/2020   Tinea cruris    HIT (heparin-induced thrombocytopenia) (HCC)    Pain of right lower extremity    Atrial fibrillation (HCC)    Typical atrial flutter (HCC)    Pressure injury of skin 09/13/2020   PAOD (peripheral arterial occlusive disease) (HCC)    Multiple wounds of skin    Lower limb ischemia 09/08/2020   Gangrene of right foot (HCC)    Chronic low back pain 04/27/2020   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 MRN: [id number] Date of Birth: 08-02-1961

## 2021-04-05 ENCOUNTER — Encounter: Payer: Self-pay | Admitting: Physical Therapy

## 2021-04-05 ENCOUNTER — Ambulatory Visit: Payer: Medicare Other | Admitting: Physical Therapy

## 2021-04-05 ENCOUNTER — Other Ambulatory Visit: Payer: Self-pay

## 2021-04-05 DIAGNOSIS — Z89511 Acquired absence of right leg below knee: Secondary | ICD-10-CM

## 2021-04-05 DIAGNOSIS — M6281 Muscle weakness (generalized): Secondary | ICD-10-CM

## 2021-04-05 DIAGNOSIS — R2689 Other abnormalities of gait and mobility: Secondary | ICD-10-CM

## 2021-04-05 NOTE — Therapy (Signed)
[facility] [address] [place], [place], [address] Phone: [phone]   Fax:  [phone]  Physical Therapy Treatment  Patient Details  Name: Peter Mendez. MRN: [id number] Date of Birth: 17-Sep-1960 Referring Provider (PT): Chales Abrahams Persons, [place]   Encounter Date: 04/05/2021   PT End of Session - 04/05/21 1141     Visit Number 5    Number of Visits 17    Date for PT Re-Evaluation 05/10/21    Authorization Type Medicare    Progress Note Due on Visit 10    PT Start Time 1045    PT Stop Time 1130    PT Time Calculation (min) 45 min    Equipment Utilized During Treatment Gait belt    Activity Tolerance Patient tolerated treatment well    Behavior During Therapy WFL for tasks assessed/performed             Past Medical History:  Diagnosis Date   Chicken pox    Claudication (HCC)    Diabetes 1.5, managed as type 1 (HCC)    Hypertension    Low back pain    PAD (peripheral artery disease) (HCC)    Tobacco abuse     Past Surgical History:  Procedure Laterality Date   AMPUTATION Right 09/18/2020   Procedure: RIGHT BELOW KNEE AMPUTATION;  Surgeon: Nadara Mustard, MD;  Location: [facility] OR;  Service: Orthopedics;  Laterality: Right;   AORTA - BILATERAL FEMORAL ARTERY BYPASS GRAFT Bilateral 09/11/2020   Procedure: AORTA BIFEMORAL BYPASS GRAFT;  Surgeon: Larina Earthly, MD;  Location: [facility] OR;  Service: Vascular;  Laterality: Bilateral;   CATARACT EXTRACTION Bilateral    FEMORAL-POPLITEAL BYPASS GRAFT Right 09/11/2020   Procedure: RIGHT FEMORAL-POPLITEAL ARTERY BYPASS GRAFTING;  Surgeon: Larina Earthly, MD;  Location: [facility] OR;  Service: Vascular;  Laterality: Right;   INTRAOCULAR LENS INSERTION Bilateral    RETINAL DETACHMENT SURGERY Bilateral     There were no vitals filed for this visit.   Subjective Assessment - 04/05/21 1045     Subjective Pt walked out in the yard over the weekend.    Pertinent History R BKA 09/18/20;  legally blind    Limitations Walking;House hold activities    How long can you sit comfortably? No issues    How long can you stand comfortably? 15-20 min    How long can you walk comfortably? 10 min    Patient Stated Goals Improve balance    Pain Onset More than a month ago                               [facility] Adult PT Treatment/Exercise - 04/05/21 0001       Ambulation/Gait   Ambulation/Gait Yes    Ambulation/Gait Assistance 5: Supervision;4: Min guard   min guard on curb negotiation   Ambulation/Gait Assistance Details community type gait training out side working on safety with inclines and community barriers.    Ambulation Distance (Feet) 300 Feet    Assistive device None    Gait Pattern Step-through pattern    Ambulation Surface Outdoor;Paved;Unlevel;Level;Gravel;Other (comment)    Curb Other (comment)   min guard, cues for safe sequence (step down with prosthises step up with LLE), without AD     Knee/Hip Exercises: Aerobic   Stepper seated stepper: pt's R LE muscles cramped with outdoor gait on uneven surface; performed stepper to address muscle tightness: Level 2.0,  8 min, ALL extremites      Prosthetics   Prosthetic Care Comments  Pt reports peforming desensitizing massage for pain as needed    Current prosthetic wear tolerance (days/week)  daily    Current prosthetic wear tolerance (#hours/day)  10hr    Edema none    Residual limb condition  slight red spot at distal tibia with white scab present    Education Provided Skin check;Residual limb care;Care of non-amputated limb    Person(s) Educated Patient    Education Method Explanation    Education Method Verbalized understanding;Tactile cues required    Donning Prosthesis Supervision    Doffing Prosthesis Supervision                 Balance Exercises - 04/05/21 0001       Balance Exercises: Standing   Retro Gait Other reps (comment)   gravel, cues for foot clearance with heel, min  guard.   Sidestepping Foam/compliant support;Other reps (comment)   on mulch outside, cues for feet together to practice NBOS, progressing with head turns, min guard to min A for balance with NBOS.              PT Education - 04/05/21 1137     Education Details Verbally reviewed HEP standing at the sink.              PT Short Term Goals - 03/15/21 1225       PT SHORT TERM GOAL #1   Title Pt will demo 18/30 on FGA to improve functional balanec with ambulation    Baseline 14/30 (03/15/21)    Time 4    Period Weeks    Status New    Target Date 04/12/21      PT SHORT TERM GOAL #2   Title Pt will return verbal demonstration on how to properly care for prosthetic components and residual leg with visual and tactile observations with assist of his fiance (due to visual impairments)    Baseline Needs further education (03/15/21)    Time 4    Period Weeks    Status New    Target Date 04/12/21      PT SHORT TERM GOAL #3   Title Pt will be able to negotiate curb and ramp with SBA and with or without appropriate AD to improve community negotiation    Baseline Not attempted    Time 4    Period Weeks    Status New    Target Date 05/10/21               PT Long Term Goals - 03/15/21 1227       PT LONG TERM GOAL #1   Title Pt will be able to ambulate on non compliant surface with or without use of appropriate AD for 200 feet with SBA to improve functional ambulation in community    Baseline Not attempted    Time 8    Period Weeks    Status New    Target Date 05/10/21      PT LONG TERM GOAL #2   Title Patient will demo score of >20/30 on FGA to improve functional ambulation and reduce fall    Baseline 14/30    Time 8    Period Weeks    Status New    Target Date 05/10/21      PT LONG TERM GOAL #3   Title Pt will be able to ambulate >1000 feet with SBA with or without  AD to safely navigate community    Baseline Uses wheelchair in community, no AD at home    Time 8     Period Weeks    Status New    Target Date 05/10/21                   Plan - 04/05/21 1138     Clinical Impression Statement Pt is making steady progress towards STGs demonstrating supervision level gait on outdoor level surfaces and min guard to min A on uneven outdoor surfaces ambulating without AD.    Personal Factors and Comorbidities Age;Comorbidity 3+;Past/Current Experience;Time since onset of injury/illness/exacerbation;Transportation    Comorbidities DM, R BKA, visual impariemnts    Examination-Activity Limitations Caring for Others;Carry;Hygiene/Grooming;Squat;Stairs;Stand;Transfers    Examination-Participation Restrictions Church;Cleaning;Community Activity;Laundry;Shop;Yard Work    Stability/Clinical Decision Making Stable/Uncomplicated    Rehab Potential Good    PT Frequency 2x / week    PT Duration 8 weeks    PT Treatment/Interventions ADLs/Self Care Home Management;Cryotherapy;Moist Heat;Gait training;Stair training;Functional mobility training;Therapeutic activities;Therapeutic exercise;Balance training;Manual techniques;Prosthetic Training;Orthotic Fit/Training;Patient/family education;Neuromuscular re-education;Scar mobilization;Passive range of motion;Energy conservation;Visual/perceptual remediation/compensation;Joint Manipulations    PT Next Visit Plan check HEP issued 03/29/21. Prosthetic education review as needed and monitor distal tibia as had some redness present with scab. Gait without AD on mat/varied surfaces, balance on compliant surfaces. Pt is legally blind. Issue initial HEP for hip strengthening and balance. Monitor BP as was elevated at end of session.    Consulted and Agree with Plan of Care Patient;Family member/caregiver    Family Member Consulted Faince Gunnar Fusi)             Patient will benefit from skilled therapeutic intervention in order to improve the following deficits and impairments:  Abnormal gait, Decreased activity tolerance,  Decreased balance, Decreased coordination, Decreased range of motion, Decreased mobility, Decreased endurance, Decreased skin integrity, Decreased strength, Difficulty walking, Increased edema, Increased fascial restricitons, Impaired flexibility, Impaired vision/preception, Improper body mechanics, Postural dysfunction, Prosthetic Dependency  Visit Diagnosis: Other abnormalities of gait and mobility  Muscle weakness (generalized)  Hx of BKA, right (HCC)     Problem List Patient Active Problem List   Diagnosis Date Noted   IDA (iron deficiency anemia) 09/24/2020   Tinea cruris    HIT (heparin-induced thrombocytopenia) (HCC)    Pain of right lower extremity    Atrial fibrillation (HCC)    Typical atrial flutter (HCC)    Pressure injury of skin 09/13/2020   PAOD (peripheral arterial occlusive disease) (HCC)    Multiple wounds of skin    Lower limb ischemia 09/08/2020   Gangrene of right foot (HCC)    Chronic low back pain 04/27/2020   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 [place], [place], [address] Phone: [phone]   Fax:  [phone]  Name: Peter Mendez. MRN: [id number] Date of Birth: 1960-09-25

## 2021-04-07 ENCOUNTER — Other Ambulatory Visit: Payer: Self-pay

## 2021-04-07 ENCOUNTER — Ambulatory Visit: Payer: Medicare Other

## 2021-04-07 DIAGNOSIS — R2689 Other abnormalities of gait and mobility: Secondary | ICD-10-CM

## 2021-04-07 DIAGNOSIS — Z89511 Acquired absence of right leg below knee: Secondary | ICD-10-CM

## 2021-04-07 DIAGNOSIS — M6281 Muscle weakness (generalized): Secondary | ICD-10-CM

## 2021-04-07 NOTE — Therapy (Signed)
[facility] [address] [place], [place], [address] Phone: [phone]   Fax:  [phone]  Physical Therapy Treatment  Patient Details  Name: Peter Mendez. MRN: [id number] Date of Birth: [birth year] Referring Provider (PT): Chales Abrahams Persons, [place]   Encounter Date: 04/07/2021   PT End of Session - 04/07/21 1240     Visit Number 6    Number of Visits 17    Date for PT Re-Evaluation 05/10/21    Authorization Type Medicare    Progress Note Due on Visit 10    PT Start Time 1230    PT Stop Time 1315    PT Time Calculation (min) 45 min    Equipment Utilized During Treatment Gait belt    Activity Tolerance Patient tolerated treatment well    Behavior During Therapy WFL for tasks assessed/performed             Past Medical History:  Diagnosis Date   Chicken pox    Claudication (HCC)    Diabetes 1.5, managed as type 1 (HCC)    Hypertension    Low back pain    PAD (peripheral artery disease) (HCC)    Tobacco abuse     Past Surgical History:  Procedure Laterality Date   AMPUTATION Right 09/18/2020   Procedure: RIGHT BELOW KNEE AMPUTATION;  Surgeon: Nadara Mustard, MD;  Location: [facility] OR;  Service: Orthopedics;  Laterality: Right;   AORTA - BILATERAL FEMORAL ARTERY BYPASS GRAFT Bilateral 09/11/2020   Procedure: AORTA BIFEMORAL BYPASS GRAFT;  Surgeon: Larina Earthly, MD;  Location: [facility] OR;  Service: Vascular;  Laterality: Bilateral;   CATARACT EXTRACTION Bilateral    FEMORAL-POPLITEAL BYPASS GRAFT Right 09/11/2020   Procedure: RIGHT FEMORAL-POPLITEAL ARTERY BYPASS GRAFTING;  Surgeon: Larina Earthly, MD;  Location: [facility] OR;  Service: Vascular;  Laterality: Right;   INTRAOCULAR LENS INSERTION Bilateral    RETINAL DETACHMENT SURGERY Bilateral     There were no vitals filed for this visit.   Subjective Assessment - 04/07/21 1241     Subjective I am doing okay. Have been doing towel desensitization as needed and it has  helped.    Pertinent History R BKA 09/18/20; legally blind; 10lb lifting restriction due to surgeries in R EYE    Limitations Walking;House hold activities    How long can you sit comfortably? No issues    How long can you stand comfortably? 15-20 min    How long can you walk comfortably? 10 min    Patient Stated Goals Improve balance    Currently in Pain? Yes    Pain Score 1     Pain Location Leg    Pain Orientation Right    Pain Onset More than a month ago                   Going up and down incline: fwd and bwd: 10x each going up the incline and decline, CGA Standing on foam wide BOS: horizontal and vertical head turns: 10x each Gait training on grass: SBA/CGA 200 feet on grass; 20 feet on gravel, cues to get clsoe to edge before stepping up/down on different surfaces. Negotiating 1-2" curb: 4x with CGA Floor transfers with bil UE HHA: 2x SBA with cueing for sequencing. BP 168/79                         PT Short Term Goals - 04/07/21 1516  PT SHORT TERM GOAL #1   Title Pt will demo 18/30 on FGA to improve functional balanec with ambulation    Baseline 14/30 (03/15/21)    Time 4    Period Weeks    Status New    Target Date 04/12/21      PT SHORT TERM GOAL #2   Title Pt will return verbal demonstration on how to properly care for prosthetic components and residual leg with visual and tactile observations with assist of his fiance (due to visual impairments)    Baseline Needs further education (03/15/21); pt is independent with prosthetic leg and residual leg care (04/07/21)    Time 4    Period Weeks    Status Achieved    Target Date 04/12/21      PT SHORT TERM GOAL #3   Title Pt will be able to negotiate curb and ramp with SBA and with or without appropriate AD to improve community negotiation    Baseline Not attempted; SBA/CGA (04/07/21)    Time 4    Period Weeks    Status On-going    Target Date 05/10/21               PT Long  Term Goals - 04/07/21 1517       PT LONG TERM GOAL #1   Title Pt will be able to ambulate on non compliant surface with or without use of appropriate AD for 200 feet with SBA to improve functional ambulation in community    Baseline Not attempted; CGA on grass for 200 feet without AD (04/07/21)    Time 8    Period Weeks    Status On-going      PT LONG TERM GOAL #2   Title Patient will demo score of >20/30 on FGA to improve functional ambulation and reduce fall    Baseline 14/30    Time 8    Period Weeks    Status New      PT LONG TERM GOAL #3   Title Pt will be able to ambulate >1000 feet with SBA with or without AD to safely navigate community    Baseline Uses wheelchair in community, no AD at home    Time 8    Period Weeks    Status New                   Plan - 04/07/21 1313     Clinical Impression Statement Pt tolerated session well. Pt able to get up from floor with SBA only. Pt demonstrates adequate safety awareness with ambulation on level and unlevel grounds. Pt is compliant with prosthetic component care and residual leg care. BP was still high today and patient was educated to contact PCP for improved BP management.    Personal Factors and Comorbidities Age;Comorbidity 3+;Past/Current Experience;Time since onset of injury/illness/exacerbation;Transportation    Comorbidities DM, R BKA, visual impariemnts    Examination-Activity Limitations Caring for Others;Carry;Hygiene/Grooming;Squat;Stairs;Stand;Transfers    Examination-Participation Restrictions Church;Cleaning;Community Activity;Laundry;Shop;Yard Work    Stability/Clinical Decision Making Stable/Uncomplicated    Rehab Potential Good    PT Frequency 2x / week    PT Duration 8 weeks    PT Treatment/Interventions ADLs/Self Care Home Management;Cryotherapy;Moist Heat;Gait training;Stair training;Functional mobility training;Therapeutic activities;Therapeutic exercise;Balance training;Manual techniques;Prosthetic  Training;Orthotic Fit/Training;Patient/family education;Neuromuscular re-education;Scar mobilization;Passive range of motion;Energy conservation;Visual/perceptual remediation/compensation;Joint Manipulations    PT Next Visit Plan Start to review goals. D/C converstaion initiated today and plan to D/C in next 2 sessions as long as goals are  met.    Consulted and Agree with Plan of Care Patient;Family member/caregiver    Family Member Consulted Faince Nevin Bloodgood)             Patient will benefit from skilled therapeutic intervention in order to improve the following deficits and impairments:  Abnormal gait, Decreased activity tolerance, Decreased balance, Decreased coordination, Decreased range of motion, Decreased mobility, Decreased endurance, Decreased skin integrity, Decreased strength, Difficulty walking, Increased edema, Increased fascial restricitons, Impaired flexibility, Impaired vision/preception, Improper body mechanics, Postural dysfunction, Prosthetic Dependency  Visit Diagnosis: Other abnormalities of gait and mobility  Muscle weakness (generalized)  Hx of BKA, right ([place])     Problem List Patient Active Problem List   Diagnosis Date Noted   IDA (iron deficiency anemia) 09/24/2020   Tinea cruris    HIT (heparin-induced thrombocytopenia) (HCC)    Pain of right lower extremity    Atrial fibrillation ([place])    Typical atrial flutter ([place])    Pressure injury of skin 09/13/2020   PAOD (peripheral arterial occlusive disease) ([place])    Multiple wounds of skin    Lower limb ischemia 09/08/2020   Gangrene of right foot (HCC)    Chronic low back pain 04/27/2020   Diabetic peripheral neuropathy associated with type 1 diabetes mellitus ([place]) 05/23/2016   Chronic back pain 12/29/2015   Generalized anxiety disorder 12/29/2015   PVD (peripheral vascular disease) ([place]) 05/09/2014   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 MRN: [id number] Date of Birth: [birth year]

## 2021-04-09 ENCOUNTER — Other Ambulatory Visit: Payer: Self-pay

## 2021-04-09 ENCOUNTER — Ambulatory Visit (INDEPENDENT_AMBULATORY_CARE_PROVIDER_SITE_OTHER): Payer: Medicare Other | Admitting: Cardiovascular Disease

## 2021-04-09 ENCOUNTER — Encounter: Payer: Self-pay | Admitting: Cardiovascular Disease

## 2021-04-09 VITALS — BP 200/83 | HR 58 | Ht 71.0 in | Wt 126.8 lb

## 2021-04-09 DIAGNOSIS — I739 Peripheral vascular disease, unspecified: Secondary | ICD-10-CM

## 2021-04-09 DIAGNOSIS — Z720 Tobacco use: Secondary | ICD-10-CM

## 2021-04-09 DIAGNOSIS — E785 Hyperlipidemia, unspecified: Secondary | ICD-10-CM | POA: Diagnosis not present

## 2021-04-09 DIAGNOSIS — E7800 Pure hypercholesterolemia, unspecified: Secondary | ICD-10-CM

## 2021-04-09 DIAGNOSIS — I779 Disorder of arteries and arterioles, unspecified: Secondary | ICD-10-CM | POA: Diagnosis not present

## 2021-04-09 DIAGNOSIS — I10 Essential (primary) hypertension: Secondary | ICD-10-CM

## 2021-04-09 DIAGNOSIS — I480 Paroxysmal atrial fibrillation: Secondary | ICD-10-CM

## 2021-04-09 MED ORDER — RAMIPRIL 10 MG PO CAPS: 10.0000 mg | ORAL_CAPSULE | Freq: Every day | ORAL | 3 refills | Status: DC

## 2021-04-09 MED ORDER — AMLODIPINE BESYLATE 2.5 MG PO TABS: 2.5000 mg | ORAL_TABLET | Freq: Every day | ORAL | 3 refills | Status: DC

## 2021-04-09 NOTE — Progress Notes (Signed)
04/09/2021 Irma Newness.   [birth year]  [id number]  Primary Physician Leilani Able, MD Primary Cardiologist: Runell Gess MD Milagros Loll, [place], [place]  HPI:  Peter Mendez. is a 60 y.o.  thin appearing single Caucasian male with no children referred by [facility] at [facility] urgent care for evaluation of peripheral vascular disease.  [name] accompanied by his peripheral other Billie Lade.  I last saw him in the office 05/09/2014.  His cardiovascular risk factor profile is remarkable for 40-pack-years of tobacco abuse currently smoking one pack per day, treated hypertension, type 1 diabetes as well as a family history of heart disease with a father who had a myocardial infarction at age 20. He has never had a heart attack or stroke. He denies chest pain or shortness of breath. He has been a type I diabetic for 25 or 30 years. He does have peripheral neuropathy as well as retinopathy. 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  Since I saw him in the office 7 years ago he was admitted in February of this year with critical limb ischemia.  He underwent aortobifemoral bypass grafting, right femoropopliteal bypass grafting and right below the knee amputation.  [name] recuperating nicely.  [name] still participating in physical therapy.  [name] cut down smoking down to only 4 cigarettes a day.  He did have a 2D echocardiogram that revealed an EF of 45 to 50% with an anterior wall motion abnormality.  The patient denies chest pain.  Also had Tyreesha Maharaj up A. fib maintaining sinus rhythm on Xarelto oral anticoagulation.     Current Meds  Medication Sig   acetaminophen (TYLENOL) 500 MG tablet Take 1,000 mg by mouth every 6 (six) hours as needed for mild pain or headache.   ALPRAZolam (XANAX) 1 MG tablet Take 1 mg by mouth 3 (three) times daily.   amLODipine (NORVASC) 2.5 MG tablet Take 1 tablet  (2.5 mg total) by mouth daily.   Continuous Blood Gluc Sensor (FREESTYLE LIBRE 14 DAY SENSOR) MISC 1 Units by Does not apply route every 14 (fourteen) days.   DROPLET PEN NEEDLES 31G X 8 MM MISC USE TO INJECT  INSULIN FIVE TIMES DAILY   gabapentin (NEURONTIN) 300 MG capsule Take 1 capsule (300 mg total) by mouth 3 (three) times daily.   insulin aspart (NOVOLOG FLEXPEN) 100 UNIT/ML FlexPen Inject 4-6 Units into the skin 3 (three) times daily with meals.   insulin glargine (LANTUS SOLOSTAR) 100 UNIT/ML Solostar Pen INJECT 30 UNITS SUBCUTANEOUSLY IN THE MORNING AND 32 UNITS IN THE EVENING.   Lancets (ONETOUCH ULTRASOFT) lancets Use as instructed to check blood sugars 7 times per day   omeprazole (PRILOSEC) 20 MG capsule Take 20 mg by mouth daily.   oxyCODONE-acetaminophen (PERCOCET) 10-325 MG tablet Take 1 tablet by mouth 3 (three) times daily.   rosuvastatin (CRESTOR) 20 MG tablet Take 1 tablet (20 mg total) by mouth daily.   [DISCONTINUED] ramipril (ALTACE) 5 MG capsule TAKE 1 CAPSULE EVERY DAY (Patient taking differently: Take 5 mg by mouth daily.)     Allergies  Allergen Reactions   Heparin Other (See Comments)    Heparin (SRA positive)   Pravastatin Sodium Other (See Comments)    myalgia    Social History   Socioeconomic History   Marital status: Single    Spouse name: Not on file  Number of children: Not on file   Years of education: Not on file   Highest education level: Not on file  Occupational History   Not on file  Tobacco Use   Smoking status: Every Day    Packs/day: 0.75    Years: 42.00    Pack years: 31.50    Types: Cigarettes   Smokeless tobacco: Never  Vaping Use   Vaping Use: Never used  Substance and Sexual Activity   Alcohol use: No    Alcohol/week: 0.0 standard drinks   Drug use: Not Currently   Sexual activity: Not on file  Other Topics Concern   Not on file  Social History Narrative   Not on file   Social Determinants of Health   Financial  Resource Strain: Not on file  Food Insecurity: Not on file  Transportation Needs: Not on file  Physical Activity: Not on file  Stress: Not on file  Social Connections: Not on file  Intimate Partner Violence: Not on file     Review of Systems: General: negative for chills, fever, night sweats or weight changes.  Cardiovascular: negative for chest pain, dyspnea on exertion, edema, orthopnea, palpitations, paroxysmal nocturnal dyspnea or shortness of breath Dermatological: negative for rash Respiratory: negative for cough or wheezing Urologic: negative for hematuria Abdominal: negative for nausea, vomiting, diarrhea, bright red blood per rectum, melena, or hematemesis Neurologic: negative for visual changes, syncope, or dizziness All other systems reviewed and are otherwise negative except as noted above.    Blood pressure (!) 200/83, pulse (!) 58, height 5\' 11"  (1.803 m), weight 126 lb 12.8 oz (57.5 kg), SpO2 100 %.  General appearance: alert and no distress Neck: no adenopathy, no carotid bruit, no JVD, supple, symmetrical, trachea midline, and thyroid not enlarged, symmetric, no tenderness/mass/nodules Lungs: clear to auscultation bilaterally Heart: regular rate and rhythm, S1, S2 normal, no murmur, click, rub or gallop Extremities: extremities normal, atraumatic, no cyanosis or edema Pulses: 2+ and symmetric Skin: Skin color, texture, turgor normal. No rashes or lesions Neurologic: Grossly normal  EKG sinus bradycardia 58 with Q waves in leads III and F, and nonspecific ST and T wave changes.  I personally reviewed this EKG.  ASSESSMENT AND PLAN:   Essential hypertension History of essential hypertension blood pressure measured today of 200/83.  [name] on Zestril 5 mg a day which paragon increased to 10 mg a day as well as adding amlodipine 2.5 mg a day.  We will check a basic metabolic panel in 7 to 10 days.  Going to have him keep a blood pressure log for 30 days and see a  Pharm.D. back in follow-up.  Pure hypercholesterolemia History of hyperlipidemia on statin therapy with lipid profile performed 01/21/2020 revealing total cholesterol 152, LDL 91 and HDL 47.  We will recheck a fasting lipid liver profile.  PVD (peripheral vascular disease) (HCC) History of peripheral arterial disease and critical limb ischemia status post aortobifemoral bypass grafting by Dr. 01/23/2020 early as well as right femoropopliteal bypass grafting back in February of this year.  He had right BKA as well.  Dr. March is following him noninvasively.  [name] still completing physical therapy.  He denies claudication.  Atrial fibrillation (HCC) History of PAF maintaining sinus rhythm on Xarelto oral anticoagulation.  Tobacco abuse Long history tobacco abuse currently smoking 4 cigarettes a day with intent to quit.     Arbie Cookey MD FACP,FACC,FAHA, [facility] 04/09/2021 9:42 AM

## 2021-04-09 NOTE — Patient Instructions (Signed)
Medication Instructions:  INCREASE ramipril to 10mg  daily (you can take 2 of the 5mg  tablets until you run out)  START amlodipine 2.5mg  daily  New prescriptions for these meds sent to [facility]  *If you need a refill on your cardiac medications before your next appointment, please call your pharmacy*   Lab Work: FASTING lab work Sept 12-14 CMET, Lipid Panel  If you have labs (blood work) drawn today and your tests are completely normal, you will receive your results only by: [facility] (if you have MyChart) OR A paper copy in the mail If you have any lab test that is abnormal or we need to change your treatment, we will call you to review the results.   Testing/Procedures: NONE   Follow-Up: At [facility], you and your health needs are our priority.  As part of our continuing mission to provide you with exceptional heart care, we have created designated Provider Care Teams.  These Care Teams include your primary Cardiologist (physician) and Advanced Practice Providers (APPs -  Physician Assistants and Nurse Practitioners) who all work together to provide you with the care you need, when you need it.  We recommend signing up for the patient portal called "MyChart".  Sign up information is provided on this After Visit Summary.  MyChart is used to connect with patients for Virtual Visits (Telemedicine).  Patients are able to view lab/test results, encounter notes, upcoming appointments, etc.  Non-urgent messages can be sent to your provider as well.   To learn more about what you can do with MyChart, go to [employer].    Your next appointment:   6 month(s)  The format for your next appointment:   In Person  Provider:   You may see Dr. [facility] or one of the following Advanced Practice Providers on your designated Care Team:   [place], PA-C Allyson Sabal, FNP   Other Instructions Dr. Weatherford has requested that you schedule an appointment with one of our  clinical pharmacists for a blood pressure check appointment within the next 4 weeks.   Please monitor your BP at home at least once daily and record. Bring this log to your appointment with the pharmacist.   HOW TO TAKE YOUR BLOOD PRESSURE: Rest 5 minutes before taking your blood pressure. Don't smoke or drink caffeinated beverages for at least 30 minutes before. Take your blood pressure before (not after) you eat. Sit comfortably with your back supported and both feet on the floor (don't cross your legs). Elevate your arm to heart level on a table or a desk. Use the proper sized cuff. It should fit smoothly and snugly around your bare upper arm. There should be enough room to slip a fingertip under the cuff. The bottom edge of the cuff should be 1 inch above the crease of the elbow. Ideally, take 3 measurements at one sitting and record the average.

## 2021-04-09 NOTE — Assessment & Plan Note (Signed)
Long history tobacco abuse currently smoking 4 cigarettes a day with intent to quit.

## 2021-04-09 NOTE — Assessment & Plan Note (Signed)
History of hyperlipidemia on statin therapy with lipid profile performed 01/21/2020 revealing total cholesterol 152, LDL 91 and HDL 47.  We will recheck a fasting lipid liver profile.

## 2021-04-09 NOTE — Assessment & Plan Note (Signed)
History of peripheral arterial disease and critical limb ischemia status post aortobifemoral bypass grafting by Dr. Tawanna Cooler early as well as right femoropopliteal bypass grafting back in February of this year.  He had right BKA as well.  Dr. Arbie Cookey is following him noninvasively.  [name] still completing physical therapy.  He denies claudication.

## 2021-04-09 NOTE — Assessment & Plan Note (Signed)
History of essential hypertension blood pressure measured today of 200/83.  [name] on Zestril 5 mg a day which paragon increased to 10 mg a day as well as adding amlodipine 2.5 mg a day.  We will check a basic metabolic panel in 7 to 10 days.  Going to have him keep a blood pressure log for 30 days and see a Pharm.D. back in follow-up.

## 2021-04-09 NOTE — Assessment & Plan Note (Signed)
History of PAF maintaining sinus rhythm on Xarelto oral anticoagulation. °

## 2021-04-13 ENCOUNTER — Ambulatory Visit: Payer: Medicare Other

## 2021-04-14 ENCOUNTER — Ambulatory Visit (INDEPENDENT_AMBULATORY_CARE_PROVIDER_SITE_OTHER): Payer: Medicare Other | Admitting: Physician Assistant

## 2021-04-14 ENCOUNTER — Other Ambulatory Visit: Payer: Self-pay

## 2021-04-14 ENCOUNTER — Encounter: Payer: Self-pay | Admitting: Physician Assistant

## 2021-04-14 DIAGNOSIS — S88111A Complete traumatic amputation at level between knee and ankle, right lower leg, initial encounter: Secondary | ICD-10-CM

## 2021-04-14 DIAGNOSIS — Z89511 Acquired absence of right leg below knee: Secondary | ICD-10-CM

## 2021-04-14 NOTE — Progress Notes (Signed)
Office Visit Note   Patient: Peter Mendez.           Date of Birth: April 19, 1961           MRN: [id number] Visit Date: 04/14/2021              Requested by: Leilani Able, MD [address] [place],  [place] [address] PCP: Leilani Able, MD  Chief Complaint  Patient presents with   Right Knee - Follow-up      HPI: Here for his final check status post right below-knee amputation.  He has completed physical therapy.  [name] extremely pleased with the results of his surgery.  He does have 1 area on the anterior part of his amputation stump that sometimes rubs.  Assessment & Plan: Visit Diagnoses: No diagnosis found.  Plan: Patient will contact Megan at [facility].  See if she can pad this area in his socket.  He understands it is not unusual to require a new socket in the first year or 2.  Follow-Up Instructions: No follow-ups on file.   Ortho Exam  Patient is alert, oriented, no adenopathy, well-dressed, normal affect, normal respiratory effort. Examination demonstrates well-healed surgical incision.  He does have a somewhat prominent tibial tubercle distally.  There is no open skin or drainage at this point.  No ascending cellulitis.  He has knee range of motion  Imaging: No results found. No images are attached to the encounter.  Labs: Lab Results  Component Value Date   HGBA1C 8.8 (A) 07/21/2020   HGBA1C 9.3 (A) 04/28/2020   HGBA1C 8.7 (A) 01/21/2020   REPTSTATUS 09/13/2020 FINAL 09/08/2020   CULT  09/08/2020    NO GROWTH 5 DAYS Performed at [facility], [address], [place], [place] [address]      Lab Results  Component Value Date   ALBUMIN 2.1 (L) 09/21/2020   ALBUMIN 2.5 (L) 09/13/2020   ALBUMIN 2.8 (L) 09/12/2020    Lab Results  Component Value Date   MG 1.9 09/24/2020   MG 2.1 09/23/2020   MG 1.9 09/22/2020   No results found for: VD25OH  No results found for: PREALBUMIN CBC EXTENDED Latest Ref Rng & Units 09/25/2020 09/24/2020 09/23/2020  WBC  4.0 - 10.5 K/uL 8.9 9.9 10.7(H)  RBC 4.22 - 5.81 MIL/uL 2.71(L) 2.51(L) 2.72(L)  HGB 13.0 - 17.0 g/dL 8.1(L) 7.6(L) 8.3(L)  HCT 39.0 - 52.0 % 24.5(L) 22.1(L) 23.5(L)  PLT 150 - 400 K/uL 59(L) 57(L) 48(L)  NEUTROABS 1.7 - 7.7 K/uL - - -  LYMPHSABS 0.7 - 4.0 K/uL - - -     There is no height or weight on file to calculate BMI.  Orders:  No orders of the defined types were placed in this encounter.  No orders of the defined types were placed in this encounter.    Procedures: No procedures performed  Clinical Data: No additional findings.  ROS:  All other systems negative, except as noted in the HPI. Review of Systems  Objective: Vital Signs: There were no vitals taken for this visit.  Specialty Comments:  No specialty comments available.  PMFS History: Patient Active Problem List   Diagnosis Date Noted   Tobacco abuse 04/09/2021   IDA (iron deficiency anemia) 09/24/2020   Tinea cruris    HIT (heparin-induced thrombocytopenia) (HCC)    Pain of right lower extremity    Atrial fibrillation (HCC)    Typical atrial flutter (HCC)    Pressure injury  of skin 09/13/2020   PAOD (peripheral arterial occlusive disease) (HCC)    Multiple wounds of skin    Lower limb ischemia 09/08/2020   Gangrene of right foot (HCC)    Chronic low back pain 04/27/2020   Diabetic peripheral neuropathy associated with type 1 diabetes mellitus (HCC) 05/23/2016   Chronic back pain 12/29/2015   Generalized anxiety disorder 12/29/2015   PVD (peripheral vascular disease) (HCC) 05/09/2014   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   Hyponatremia 02/25/2013   Essential hypertension 02/25/2013   Pure hypercholesterolemia 02/25/2013   Past Medical History:  Diagnosis Date   Chicken pox    Claudication (HCC)    Diabetes 1.5, managed as type 1 (HCC)    Hypertension    Low back pain    PAD (peripheral artery disease) (HCC)    Tobacco abuse     Family History   Problem Relation Age of Onset   Diabetes Mother    Atrial fibrillation Mother    Heart attack Father    Stroke Father    Arthritis Father    Heart disease Father    Diabetes Maternal Uncle    Diabetes Maternal Grandmother    Heart attack Maternal Grandmother    Heart disease Maternal Grandmother    Esophageal cancer Maternal Grandfather    Heart attack Sister        at age 49    Past Surgical History:  Procedure Laterality Date   AMPUTATION Right 09/18/2020   Procedure: RIGHT BELOW KNEE AMPUTATION;  Surgeon: Nadara Mustard, MD;  Location: [facility] OR;  Service: Orthopedics;  Laterality: Right;   AORTA - BILATERAL FEMORAL ARTERY BYPASS GRAFT Bilateral 09/11/2020   Procedure: AORTA BIFEMORAL BYPASS GRAFT;  Surgeon: Larina Earthly, MD;  Location: [facility] OR;  Service: Vascular;  Laterality: Bilateral;   CATARACT EXTRACTION Bilateral    FEMORAL-POPLITEAL BYPASS GRAFT Right 09/11/2020   Procedure: RIGHT FEMORAL-POPLITEAL ARTERY BYPASS GRAFTING;  Surgeon: Larina Earthly, MD;  Location: [facility] OR;  Service: Vascular;  Laterality: Right;   INTRAOCULAR LENS INSERTION Bilateral    RETINAL DETACHMENT SURGERY Bilateral    Social History   Occupational History   Not on file  Tobacco Use   Smoking status: Every Day    Packs/day: 0.75    Years: 42.00    Pack years: 31.50    Types: Cigarettes   Smokeless tobacco: Never  Vaping Use   Vaping Use: Never used  Substance and Sexual Activity   Alcohol use: No    Alcohol/week: 0.0 standard drinks   Drug use: Not Currently   Sexual activity: Not on file

## 2021-04-16 ENCOUNTER — Other Ambulatory Visit: Payer: Self-pay

## 2021-04-16 ENCOUNTER — Ambulatory Visit: Payer: Medicare Other | Attending: Family Medicine

## 2021-04-16 DIAGNOSIS — R2689 Other abnormalities of gait and mobility: Secondary | ICD-10-CM | POA: Diagnosis present

## 2021-04-16 DIAGNOSIS — Z89511 Acquired absence of right leg below knee: Secondary | ICD-10-CM | POA: Diagnosis present

## 2021-04-16 DIAGNOSIS — M6281 Muscle weakness (generalized): Secondary | ICD-10-CM | POA: Diagnosis not present

## 2021-04-16 NOTE — Therapy (Signed)
[facility] [address] [place], [place], [address] Phone: [phone]   Fax:  [phone]  Physical Therapy Discharge summary  Patient Details  Name: Peter Mendez. MRN: [id number] Date of Birth: 1960/09/06 Referring Provider (PT): Audrea Muscat Persons, [place]   Encounter Date: 04/16/2021   PT End of Session - 04/16/21 1113     Visit Number 7    Number of Visits 17    Date for PT Re-Evaluation 05/10/21    Authorization Type Medicare    Progress Note Due on Visit 10    PT Start Time 1110    PT Stop Time 1150    PT Time Calculation (min) 40 min    Equipment Utilized During Treatment Gait belt    Activity Tolerance Patient tolerated treatment well    Behavior During Therapy WFL for tasks assessed/performed             Past Medical History:  Diagnosis Date   Chicken pox    Claudication ([place])    Diabetes 1.5, managed as type 1 ([place])    Hypertension    Low back pain    PAD (peripheral artery disease) ([place])    Tobacco abuse     Past Surgical History:  Procedure Laterality Date   AMPUTATION Right 09/18/2020   Procedure: RIGHT BELOW KNEE AMPUTATION;  Surgeon: Newt Minion, MD;  Location: [facility];  Service: Orthopedics;  Laterality: Right;   AORTA - BILATERAL FEMORAL ARTERY BYPASS GRAFT Bilateral 09/11/2020   Procedure: AORTA BIFEMORAL BYPASS GRAFT;  Surgeon: Rosetta Posner, MD;  Location: [facility] OR;  Service: Vascular;  Laterality: Bilateral;   CATARACT EXTRACTION Bilateral    FEMORAL-POPLITEAL BYPASS GRAFT Right 09/11/2020   Procedure: RIGHT FEMORAL-POPLITEAL ARTERY BYPASS GRAFTING;  Surgeon: Rosetta Posner, MD;  Location: [facility] OR;  Service: Vascular;  Laterality: Right;   INTRAOCULAR LENS INSERTION Bilateral    RETINAL DETACHMENT SURGERY Bilateral     There were no vitals filed for this visit.   Subjective Assessment - 04/16/21 1145     Subjective I am doing okay. No new problems.    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 No issues    How long can you stand comfortably? 15-20 min    How long can you walk comfortably? 10 min    Patient Stated Goals Improve balance    Currently in Pain? No/denies    Pain Onset More than a month ago                [facility] PT Assessment - 04/16/21 0001       Functional Gait  Assessment   Gait Level Surface Walks 20 ft in less than 5.5 sec, no assistive devices, good speed, no evidence for imbalance, normal gait pattern, deviates no more than 6 in outside of the 12 in walkway width.    Change in Gait Speed Able to smoothly change walking speed without loss of balance or gait deviation. Deviate no more than 6 in outside of the 12 in walkway width.    Gait with Horizontal Head Turns Performs head turns smoothly with no change in gait. Deviates no more than 6 in outside 12 in walkway width    Gait with Vertical Head Turns Performs head turns with no change in gait. Deviates no more than 6  in outside 12 in walkway width.    Gait and Pivot Turn Pivot turns safely within 3 sec and stops quickly with no loss of balance.    Step Over Obstacle Is able to step over one shoe box (4.5 in total height) but must slow down and adjust steps to clear box safely. May require verbal cueing.    Gait with Narrow Base of Support Ambulates 4-7 steps.    Gait with Eyes Closed Walks 20 ft, no assistive devices, good speed, no evidence of imbalance, normal gait pattern, deviates no more than 6 in outside 12 in walkway width. Ambulates 20 ft in less than 7 sec.    Ambulating Backwards Walks 20 ft, uses assistive device, slower speed, mild gait deviations, deviates 6-10 in outside 12 in walkway width.    Steps Alternating feet, must use rail.    Total Score 24    FGA comment: 24/30               Gait training: 1 x 1050' with SBA and mild verbal cues  for curbs and ramps Curb and ramp ;1x with SBA                        PT Short Term Goals - 04/16/21 1114       PT SHORT TERM GOAL #1   Title Pt will demo 18/30 on FGA to improve functional balanec with ambulation    Baseline 14/30 (03/15/21); 24/30 (04/16/21)    Time 4    Period Weeks    Status Achieved    Target Date 04/12/21      PT SHORT TERM GOAL #2   Title Pt will return verbal demonstration on how to properly care for prosthetic components and residual leg with visual and tactile observations with assist of his fiance (due to visual impairments)    Baseline Needs further education (03/15/21); pt is independent with prosthetic leg and residual leg care (04/07/21)    Time 4    Period Weeks    Status Achieved    Target Date 04/12/21      PT SHORT TERM GOAL #3   Title Pt will be able to negotiate curb and ramp with SBA and with or without appropriate AD to improve community negotiation    Baseline Not attempted; SBA/CGA (04/07/21); SBA 04/16/21 with verbal cueing due to blindness    Time 4    Period Weeks    Status Achieved    Target Date 05/10/21               PT Long Term Goals - 04/16/21 1118       PT LONG TERM GOAL #1   Title Pt will be able to ambulate on non compliant surface with or without use of appropriate AD for 200 feet with SBA to improve functional ambulation in community    Baseline Not attempted; CGA on grass for 200 feet without AD (04/07/21); SBA on grass for 200 feet withotu AD (04/16/21)    Time 8    Period Weeks    Status Achieved      PT LONG TERM GOAL #2   Title Patient will demo score of >20/30 on FGA to improve functional ambulation and reduce fall    Baseline 14/30    Time 8    Period Weeks    Status Achieved      PT LONG TERM GOAL #3   Title Pt will be able  to ambulate >1000 feet with SBA with or without AD to safely navigate community    Baseline Uses wheelchair in community, no AD at [place]' no Ad with SBA, needs  cueing with ramp and curb due to legal blindness and no blind stick    Time 8    Period Weeks    Status Achieved                   Plan - 04/16/21 1145     Clinical Impression Statement Patient has been seen for total of 7 sessions for R BKA from 03/15/21 to 04/16/21. Patient has met all of his short term and long term goals and has made significant improvement in his functional mobility and balance. Patient will be discharged from skilled PT with indepdnent home exercise management.    Personal Factors and Comorbidities Age;Comorbidity 3+;Past/Current Experience;Time since onset of injury/illness/exacerbation;Transportation    Comorbidities DM, R BKA, visual impariemnts    Examination-Activity Limitations Caring for Others;Carry;Hygiene/Grooming;Squat;Stairs;Stand;Transfers    Examination-Participation Restrictions Church;Cleaning;Community Activity;Laundry;Shop;Yard Work    Stability/Clinical Decision Making Stable/Uncomplicated    Rehab Potential Good    PT Frequency --    PT Duration --    PT Treatment/Interventions ADLs/Self Care Home Management;Cryotherapy;Moist Heat;Gait training;Stair training;Functional mobility training;Therapeutic activities;Therapeutic exercise;Balance training;Manual techniques;Prosthetic Training;Orthotic Fit/Training;Patient/family education;Neuromuscular re-education;Scar mobilization;Passive range of motion;Energy conservation;Visual/perceptual remediation/compensation;Joint Manipulations    PT Next Visit Plan Start to review goals. D/C converstaion initiated today and plan to D/C in next 2 sessions as long as goals are met.    Consulted and Agree with Plan of Care Patient;Family member/caregiver    Family Member Consulted Faince Nevin Bloodgood)             Patient will benefit from skilled therapeutic intervention in order to improve the following deficits and impairments:  Abnormal gait, Decreased activity tolerance, Decreased balance, Decreased  coordination, Decreased range of motion, Decreased mobility, Decreased endurance, Decreased skin integrity, Decreased strength, Difficulty walking, Increased edema, Increased fascial restricitons, Impaired flexibility, Impaired vision/preception, Improper body mechanics, Postural dysfunction, Prosthetic Dependency  Visit Diagnosis: Muscle weakness (generalized)  Hx of BKA, right ([place])  Other abnormalities of gait and mobility     Problem List Patient Active Problem List   Diagnosis Date Noted   Tobacco abuse 04/09/2021   IDA (iron deficiency anemia) 09/24/2020   Tinea cruris    HIT (heparin-induced thrombocytopenia) (H[facility])    Pain of right lower extremity    Atrial fibrillation ([place])    Typical atrial flutter ([place])    Pressure injury of skin 09/13/2020   PAOD (peripheral arterial occlusive disease) ([place])    Multiple wounds of skin    Lower limb ischemia 09/08/2020   Gangrene of right foot ([place])    Chronic low back pain 04/27/2020   Diabetic peripheral 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 MRN: [id number] Date of Birth: April 01, 1961

## 2021-04-19 ENCOUNTER — Ambulatory Visit: Payer: Medicare Other

## 2021-04-20 ENCOUNTER — Other Ambulatory Visit: Payer: Self-pay

## 2021-04-20 ENCOUNTER — Telehealth: Payer: Self-pay

## 2021-04-20 DIAGNOSIS — E785 Hyperlipidemia, unspecified: Secondary | ICD-10-CM

## 2021-04-20 DIAGNOSIS — Z79899 Other long term (current) drug therapy: Secondary | ICD-10-CM

## 2021-04-20 DIAGNOSIS — E875 Hyperkalemia: Secondary | ICD-10-CM

## 2021-04-20 DIAGNOSIS — I10 Essential (primary) hypertension: Secondary | ICD-10-CM

## 2021-04-20 LAB — COMPREHENSIVE METABOLIC PANEL
ALT: 8 IU/L (ref 0–44)
AST: 21 IU/L (ref 0–40)
Albumin/Globulin Ratio: 1.6 (ref 1.2–2.2)
Albumin: 4.4 g/dL (ref 3.8–4.8)
Alkaline Phosphatase: 89 IU/L (ref 44–121)
BUN/Creatinine Ratio: 14 (ref 10–24)
BUN: 11 mg/dL (ref 8–27)
Bilirubin Total: 0.5 mg/dL (ref 0.0–1.2)
CO2: 27 mmol/L (ref 20–29)
Calcium: 9.7 mg/dL (ref 8.6–10.2)
Chloride: 91 mmol/L — ABNORMAL LOW (ref 96–106)
Creatinine, Ser: 0.78 mg/dL (ref 0.76–1.27)
Globulin, Total: 2.7 g/dL (ref 1.5–4.5)
Glucose: 260 mg/dL — ABNORMAL HIGH (ref 65–99)
Potassium: 5.8 mmol/L (ref 3.5–5.2)
Sodium: 132 mmol/L — ABNORMAL LOW (ref 134–144)
Total Protein: 7.1 g/dL (ref 6.0–8.5)
eGFR: 101 mL/min/{1.73_m2} (ref >59)

## 2021-04-20 LAB — LIPID PANEL
Chol/HDL Ratio: 2.5 ratio (ref 0.0–5.0)
Cholesterol, Total: 161 mg/dL (ref 100–199)
HDL: 64 mg/dL (ref >39)
LDL Chol Calc (NIH): 85 mg/dL (ref 0–99)
Triglycerides: 60 mg/dL (ref 0–149)
VLDL Cholesterol Cal: 12 mg/dL (ref 5–40)

## 2021-04-20 NOTE — Telephone Encounter (Signed)
Per Dr Rennis Golden: when pt arrives give pt the high potassium foods list. Printed for pt.

## 2021-04-20 NOTE — Telephone Encounter (Signed)
Spoke with Cordelia Pen @ [facility], potassium is 5.8. Spoke w/DOD- Dr Rennis Golden, call pt again in the morning and have pt come in Tomorrow for STAT BMET to verify potassium numbers. Lab entered.  Tried to call pt and mother's numbers, they did not answer and there is no VM. Will have triage call again in the morning.

## 2021-04-21 ENCOUNTER — Ambulatory Visit: Payer: Medicare Other

## 2021-04-21 NOTE — Telephone Encounter (Signed)
Spoke to patient advised his potassium is elevated 5.8.DOD Dr.Hilty advised he will need to have repeated today.Advised to avoid potassium enriched foods.Advised when he comes to lab we will give him a list of foods to avoid.Stated he cannot come today.He has to call [facility] transportation to bring him.He will come tomorrow.

## 2021-04-23 LAB — BASIC METABOLIC PANEL
BUN/Creatinine Ratio: 13 (ref 10–24)
BUN: 10 mg/dL (ref 8–27)
CO2: 25 mmol/L (ref 20–29)
Calcium: 9.6 mg/dL (ref 8.6–10.2)
Chloride: 90 mmol/L — ABNORMAL LOW (ref 96–106)
Creatinine, Ser: 0.75 mg/dL — ABNORMAL LOW (ref 0.76–1.27)
Glucose: 204 mg/dL — ABNORMAL HIGH (ref 65–99)
Potassium: 5 mmol/L (ref 3.5–5.2)
Sodium: 132 mmol/L — ABNORMAL LOW (ref 134–144)
eGFR: 103 mL/min/{1.73_m2} (ref >59)

## 2021-04-26 ENCOUNTER — Ambulatory Visit: Payer: Medicare Other | Admitting: Physical Therapy

## 2021-04-28 ENCOUNTER — Ambulatory Visit: Payer: Medicare Other

## 2021-04-28 ENCOUNTER — Telehealth: Payer: Self-pay | Admitting: Cardiovascular Disease

## 2021-04-28 MED ORDER — RIVAROXABAN 20 MG PO TABS: 20.0000 mg | ORAL_TABLET | Freq: Every day | ORAL | 1 refills | Status: DC

## 2021-04-28 NOTE — Telephone Encounter (Signed)
While speaking with patient about BMET results, he mentioned he has been out of Xarelto for about 7 days. He needs a refill of this to [place] in [place]

## 2021-04-28 NOTE — Telephone Encounter (Signed)
ABW crcl 84 ml/min

## 2021-04-28 NOTE — Telephone Encounter (Signed)
Patient would like a 90 day supply.

## 2021-05-06 ENCOUNTER — Telehealth: Payer: Self-pay

## 2021-05-06 ENCOUNTER — Ambulatory Visit (INDEPENDENT_AMBULATORY_CARE_PROVIDER_SITE_OTHER): Payer: Medicare Other | Admitting: Pharmacist Clinician (PhC)/ Clinical Pharmacy Specialist

## 2021-05-06 ENCOUNTER — Other Ambulatory Visit: Payer: Self-pay

## 2021-05-06 DIAGNOSIS — Z720 Tobacco use: Secondary | ICD-10-CM

## 2021-05-06 DIAGNOSIS — I779 Disorder of arteries and arterioles, unspecified: Secondary | ICD-10-CM | POA: Diagnosis not present

## 2021-05-06 DIAGNOSIS — I10 Essential (primary) hypertension: Secondary | ICD-10-CM | POA: Diagnosis not present

## 2021-05-06 DIAGNOSIS — Z0000 Encounter for general adult medical examination without abnormal findings: Secondary | ICD-10-CM

## 2021-05-06 MED ORDER — AMLODIPINE BESYLATE 5 MG PO TABS: 5.0000 mg | ORAL_TABLET | Freq: Every day | ORAL | 3 refills | Status: AC

## 2021-05-06 NOTE — Assessment & Plan Note (Signed)
Patient with essential hypertension, not quite to BP goals, with evening readings trending higher.  Will have him increase amlodipine to 5 mg daily and move to evenings.  He will continue with ramipril.  Asked that he check home BP no more than twice daily and bring that to follow up, along with his home cuff.  Will see him back in the office in 6 weeks for follow up.

## 2021-05-06 NOTE — Assessment & Plan Note (Signed)
Currently smoking ~4 cigarettes per day.  Would like to stop completely.  Suggested that he limit himself to 3/day for 3-4 week, then limit to 2/day for another 3-4 weeks, then 1 then stop.  Will also reach out to Care Guide Amy Nedra Hai to contact patient for any further suggestions.

## 2021-05-06 NOTE — Progress Notes (Signed)
05/06/2021 Irma Newness. 02-03-1961 [id number]   HPI:  Peter Mendez. is a 60 y.o. male patient of Dr Allyson Sabal, with a PMH below who presents today for hypertension clinic evaluation.  When he saw Dr. Allyson Sabal about 4 weeks ago his pressure was at 200/83.  Ramipril was increased to 10 mg daily and amlodipine 2.5 mg was added.  He reports no problems with side effects or compliance.  Takes both medications each morning with breakfast.  Patient reports had good blood pressure for most of his life, only in the past year has it been elevated.    No complaints of chest pain, shortness of breath or dizziness.  Notes that he has a sore on his stump and was unable to use his prosthetic today.  Comes in wheelchair today with a friend.     Past Medical History: PAD Critical limb ischemia w/aortobifemoral bypass, right femoropopliteal bypass and right BKA  hyperlipidemia 9/22 LDL 85 on rosuvastatin 20  AF CHADS2-VASc score   DM1 12/21 A1c 8.8 - on lantus and novolog, legally blind  HIT      Blood Pressure Goal:  130/80  Current Medications: amlodipine 2.5 mg qd (am), ramipril 10 mg qd (am)  Family Hx: mother with hypertension, now 63; father with CAD, died at 45; sister 39, no heart disease, sister now at 82 had CABG x 3; no kids  Social Hx: 40 year hx tobacco abuse, now down to 4 cigarettes/day ( 1 after each meal and hs)  Diet: mostly home cooked since Covid.  Baked meats mostly, working on small portions, working with nutritionist; lotos of chicken, trying to add in fish; vegetables fresh when can; watches carbs due to DM  Exercise: just finished PT for leg  Home BP readings: new cuff, given at our office earlier this month.    18 readings (< noon)  average 133/61 (59), range 105-167/51-82  26 readings (noon-5 pm) - average 122/66 (56) range106-153/47-89  20 readings (> 5 pm) - average 137/69 (57) range 103-160/50-67  Intolerances: heparin, pravastatin  Labs: 04/22/21:  Na 132, K 5.0,  Glu 204, BUN 10, SCr 0.75 GFR 103   Wt Readings from Last 3 Encounters:  05/06/21 127 lb 8 oz (57.8 kg)  04/09/21 126 lb 12.8 oz (57.5 kg)  01/25/21 135 lb (61.2 kg)   BP Readings from Last 3 Encounters:  05/06/21 (!) 142/68  04/09/21 (!) 200/83  01/25/21 (!) 180/81   Pulse Readings from Last 3 Encounters:  05/06/21 60  04/09/21 (!) 58  01/25/21 (!) 56    Current Outpatient Medications  Medication Sig Dispense Refill   acetaminophen (TYLENOL) 500 MG tablet Take 1,000 mg by mouth every 6 (six) hours as needed for mild pain or headache.     ALPRAZolam (XANAX) 1 MG tablet Take 1 mg by mouth 3 (three) times daily.     amLODipine (NORVASC) 5 MG tablet Take 1 tablet (5 mg total) by mouth daily. 90 tablet 3   Continuous Blood Gluc Sensor (FREESTYLE LIBRE 14 DAY SENSOR) MISC 1 Units by Does not apply route every 14 (fourteen) days. 6 each 4   DROPLET PEN NEEDLES 31G X 8 MM MISC USE TO INJECT  INSULIN FIVE TIMES DAILY 500 each 1   gabapentin (NEURONTIN) 300 MG capsule Take 1 capsule (300 mg total) by mouth 3 (three) times daily. 270 capsule 3   insulin aspart (NOVOLOG FLEXPEN) 100 UNIT/ML FlexPen Inject 4-6 Units into the skin  3 (three) times daily with meals. 30 mL 3   insulin glargine (LANTUS SOLOSTAR) 100 UNIT/ML Solostar Pen INJECT 30 UNITS SUBCUTANEOUSLY IN THE MORNING AND 32 UNITS IN THE EVENING. 45 mL 1   Lancets (ONETOUCH ULTRASOFT) lancets Use as instructed to check blood sugars 7 times per day 210 each 3   omeprazole (PRILOSEC) 20 MG capsule Take 20 mg by mouth daily.     oxyCODONE-acetaminophen (PERCOCET) 10-325 MG tablet Take 1 tablet by mouth 3 (three) times daily.     ramipril (ALTACE) 10 MG capsule Take 1 capsule (10 mg total) by mouth daily. 90 capsule 3   rivaroxaban (XARELTO) 20 MG TABS tablet Take 1 tablet (20 mg total) by mouth daily with supper. 90 tablet 1   rosuvastatin (CRESTOR) 20 MG tablet Take 1 tablet (20 mg total) by mouth daily. 30 tablet 0   alprazolam (XANAX)  2 MG tablet take 1 tablet by mouth twice a day if needed for anxiety (Patient not taking: No sig reported) 60 tablet 0   aspirin EC 81 MG EC tablet Take 1 tablet (81 mg total) by mouth daily. Swallow whole. (Patient not taking: No sig reported) 30 tablet 11   fluconazole (DIFLUCAN) 150 MG tablet Take 1 tablet (150 mg total) by mouth once a week. (Patient not taking: No sig reported) 2 tablet 0   oxyCODONE-acetaminophen (PERCOCET/ROXICET) 5-325 MG tablet TAKE ONE TABLET EVERY 8 HOURS (Patient not taking: Reported on 04/09/2021) 30 tablet 0   No current facility-administered medications for this visit.    Allergies  Allergen Reactions   Heparin Other (See Comments)    Heparin (SRA positive)   Pravastatin Sodium Other (See Comments)    myalgia    Past Medical History:  Diagnosis Date   Chicken pox    Claudication ([facility])    Diabetes 1.5, managed as type 1 ([facility])    Hypertension    Low back pain    PAD (peripheral artery disease) ([facility])    Tobacco abuse     Blood pressure (!) 142/68, pulse 60, resp. rate 15, height 5\' 11"  (1.803 m), weight 127 lb 8 oz (57.8 kg), SpO2 97 %.  Essential hypertension Patient with essential hypertension, not quite to BP goals, with evening readings trending higher.  Will have him increase amlodipine to 5 mg daily and move to evenings.  He will continue with ramipril.  Asked that he check home BP no more than twice daily and bring that to follow up, along with his home cuff.  Will see him back in the office in 6 weeks for follow up.    Tobacco abuse Currently smoking ~4 cigarettes per day.  Would like to stop completely.  Suggested that he limit himself to 3/day for 3-4 week, then limit to 2/day for another 3-4 weeks, then 1 then stop.  Will also reach out to Care Guide Amy to contact patient for any further suggestions.     Nedra Hai PharmD CPP [facility] [address] [place], [place]  [place] [phone]

## 2021-05-06 NOTE — Patient Instructions (Signed)
Return for a a follow up appointment November 11 at 10:30  Check your blood pressure at home daily (if able) and keep record of the readings.  Take your BP meds as follows:  Take additional 2.5 mg amlodipine tonight with supper (and Xarelto)  Starting tomorrow, take ramipril 10 mg in the mornings and amlodipine 5 mg (2 x 2.5 mg tabs) at supper.  We will send in prescription for 5 mg tablets to your pharmacy.   Bring all of your meds, your BP cuff and your record of home blood pressures to your next appointment.  Exercise as you're able, try to walk approximately 30 minutes per day.  Keep salt intake to a minimum, especially watch canned and prepared boxed foods.  Eat more fresh fruits and vegetables and fewer canned items.  Avoid eating in fast food restaurants.    HOW TO TAKE YOUR BLOOD PRESSURE: Rest 5 minutes before taking your blood pressure.  Don't smoke or drink caffeinated beverages for at least 30 minutes before. Take your blood pressure before (not after) you eat. Sit comfortably with your back supported and both feet on the floor (don't cross your legs). Elevate your arm to heart level on a table or a desk. Use the proper sized cuff. It should fit smoothly and snugly around your bare upper arm. There should be enough room to slip a fingertip under the cuff. The bottom edge of the cuff should be 1 inch above the crease of the elbow. Ideally, take 3 measurements at one sitting and record the average.

## 2021-05-06 NOTE — Telephone Encounter (Signed)
Called patient to discuss health coaching for smoking cessation per PharmD referral. Left message for patient to return call.   Gabrian Hoque Nedra Hai, [facility] Guide, Health Coach [address] [place] [address] Telephone: [phone] Email: [email or web address]

## 2021-05-10 ENCOUNTER — Telehealth: Payer: Self-pay

## 2021-05-10 DIAGNOSIS — Z0000 Encounter for general adult medical examination without abnormal findings: Secondary | ICD-10-CM

## 2021-05-10 NOTE — Telephone Encounter (Signed)
Called patient to discuss health coaching regarding smoking cessation per referral from [facility] in PharmD. Patient's mother answered the phone and was informed that Care Guide would like to talk to him about his last visit to [facility]. Patient's mother stated she would have the patient call back when she get's home and was given contact information.

## 2021-05-10 NOTE — Telephone Encounter (Signed)
Patient returned call and expressed that he was interested in quitting smoking. Patient stated that he wants to try the nicotine patch to aid in smoking cessation. Patient was offered health coaching but stated that he wanted to see what he can do and would call if necessary. Sent message to [place] regarding the patient's request for further follow up.   Falisa Lamora Nedra Hai, [facility] Guide, Health Coach [address] [place] [address] Telephone: [phone] Email: [email or web address].

## 2021-05-11 ENCOUNTER — Telehealth: Payer: Self-pay | Admitting: Pharmacist Clinician (PhC)/ Clinical Pharmacy Specialist

## 2021-05-11 MED ORDER — NICOTINE 7 MG/24HR TD PT24: 7.0000 mg | MEDICATED_PATCH | Freq: Every day | TRANSDERMAL | 0 refills | Status: DC

## 2021-05-11 NOTE — Telephone Encounter (Signed)
Patient working with care guide Amy Nedra Hai on smoking cessation.  Would like to try patches.

## 2021-06-17 ENCOUNTER — Ambulatory Visit (INDEPENDENT_AMBULATORY_CARE_PROVIDER_SITE_OTHER): Payer: Medicare Other | Admitting: Pharmacist Clinician (PhC)/ Clinical Pharmacy Specialist

## 2021-06-17 ENCOUNTER — Other Ambulatory Visit: Payer: Self-pay

## 2021-06-17 DIAGNOSIS — I779 Disorder of arteries and arterioles, unspecified: Secondary | ICD-10-CM | POA: Diagnosis not present

## 2021-06-17 DIAGNOSIS — I10 Essential (primary) hypertension: Secondary | ICD-10-CM

## 2021-06-17 NOTE — Assessment & Plan Note (Signed)
Patient with essential hypertension, doing better on current regimen of quinapril and amlodipine.  Will not make any changes today, as he did note dizziness and instability with full 5 mg dose of amlodipine.  For now he will continue to check home BP readings 3-4 times per week and keep written track.  Asked that he average the readings when fills a page and if those averages stay above 130/80 he should reach out to the office.

## 2021-06-17 NOTE — Progress Notes (Signed)
06/17/2021 Irma Newness. [birth year] [id number]   HPI:  Peter Mendez. is a 60 y.o. male patient of Dr Allyson Sabal, with a PMH below who presents today for hypertension clinic evaluation.  When he saw Dr. Allyson Sabal about 4 weeks ago his pressure was at 200/83.  Ramipril was increased to 10 mg daily and amlodipine 2.5 mg was added.  At a follow up visit in September showed much improvement, although still not to goal.  Amlodipine was increased to 5 mg daily and asked that he move this to nights for a better 24 hour balance.  He reported no problems with side effects or compliance.    Today he returns for follow up.  After taking the amlodipine for several days he noticed his morning BP readings were dropping and he had a couple of episodes of dizziness.  He divided the amlodipine dose to 2.5 mg bid, and has done much better since then.   Today he is walking with his prosthetic leg and doing well overall.    Past Medical History: PAD Critical limb ischemia w/aortobifemoral bypass, right femoropopliteal bypass and right BKA  hyperlipidemia 9/22 LDL 85 on rosuvastatin 20  AF CHADS2-VASc score   DM1 12/21 A1c 8.8 - on lantus and novolog, legally blind  HIT      Blood Pressure Goal:  130/80  Current Medications: amlodipine 2.5 mg qd (am), ramipril 10 mg qd (am)  Family Hx: mother with hypertension, now 74; father with CAD, died at 10; sister 65, no heart disease, sister now at 54 had CABG x 3; no kids  Social Hx: 40 year hx tobacco abuse, now down to 4 cigarettes/day ( 1 after each meal and hs)  Diet: mostly home cooked since Covid.  Baked meats mostly, working on small portions, working with nutritionist; lotos of chicken, trying to add in fish; vegetables fresh when can; watches carbs due to DM - has noticed that when eats breakfast pork his pressure is higher, so has been cutting back on that,   Exercise: just finished PT for leg  Home BP readings: no readings with him today.  Notes 1 reading  up to 180, that was after BBQ for dinner and bacon for breakfast.    Intolerances: heparin, pravastatin  Labs: 04/22/21:  Na 132, K 5.0, Glu 204, BUN 10, SCr 0.75 GFR 103   Wt Readings from Last 3 Encounters:  06/17/21 127 lb (57.6 kg)  05/06/21 127 lb 8 oz (57.8 kg)  04/09/21 126 lb 12.8 oz (57.5 kg)   BP Readings from Last 3 Encounters:  06/17/21 136/66  05/06/21 (!) 142/68  04/09/21 (!) 200/83   Pulse Readings from Last 3 Encounters:  06/17/21 61  05/06/21 60  04/09/21 (!) 58    Current Outpatient Medications  Medication Sig Dispense Refill   acetaminophen (TYLENOL) 500 MG tablet Take 1,000 mg by mouth every 6 (six) hours as needed for mild pain or headache.     ALPRAZolam (XANAX) 1 MG tablet Take 1 mg by mouth 3 (three) times daily.     amLODipine (NORVASC) 5 MG tablet Take 1 tablet (5 mg total) by mouth daily. 90 tablet 3   Continuous Blood Gluc Sensor (FREESTYLE LIBRE 14 DAY SENSOR) MISC 1 Units by Does not apply route every 14 (fourteen) days. 6 each 4   DROPLET PEN NEEDLES 31G X 8 MM MISC USE TO INJECT  INSULIN FIVE TIMES DAILY 500 each 1   gabapentin (NEURONTIN) 300  MG capsule Take 1 capsule (300 mg total) by mouth 3 (three) times daily. 270 capsule 3   insulin aspart (NOVOLOG FLEXPEN) 100 UNIT/ML FlexPen Inject 4-6 Units into the skin 3 (three) times daily with meals. 30 mL 3   insulin glargine (LANTUS SOLOSTAR) 100 UNIT/ML Solostar Pen INJECT 30 UNITS SUBCUTANEOUSLY IN THE MORNING AND 32 UNITS IN THE EVENING. 45 mL 1   Lancets (ONETOUCH ULTRASOFT) lancets Use as instructed to check blood sugars 7 times per day 210 each 3   omeprazole (PRILOSEC) 20 MG capsule Take 20 mg by mouth daily.     oxyCODONE-acetaminophen (PERCOCET) 10-325 MG tablet Take 1 tablet by mouth 3 (three) times daily.     ramipril (ALTACE) 10 MG capsule Take 1 capsule (10 mg total) by mouth daily. 90 capsule 3   rivaroxaban (XARELTO) 20 MG TABS tablet Take 1 tablet (20 mg total) by mouth daily with  supper. 90 tablet 1   rosuvastatin (CRESTOR) 20 MG tablet Take 1 tablet (20 mg total) by mouth daily. 30 tablet 0   alprazolam (XANAX) 2 MG tablet take 1 tablet by mouth twice a day if needed for anxiety (Patient not taking: No sig reported) 60 tablet 0   aspirin EC 81 MG EC tablet Take 1 tablet (81 mg total) by mouth daily. Swallow whole. (Patient not taking: No sig reported) 30 tablet 11   fluconazole (DIFLUCAN) 150 MG tablet Take 1 tablet (150 mg total) by mouth once a week. (Patient not taking: No sig reported) 2 tablet 0   nicotine (NICODERM CQ - DOSED IN MG/24 HR) 7 mg/24hr patch Place 1 patch (7 mg total) onto the skin daily. (Patient not taking: Reported on 06/17/2021) 28 patch 0   oxyCODONE-acetaminophen (PERCOCET/ROXICET) 5-325 MG tablet TAKE ONE TABLET EVERY 8 HOURS (Patient not taking: No sig reported) 30 tablet 0   No current facility-administered medications for this visit.    Allergies  Allergen Reactions   Heparin Other (See Comments)    Heparin (SRA positive)   Pravastatin Sodium Other (See Comments)    myalgia    Past Medical History:  Diagnosis Date   Chicken pox    Claudication (HCC)    Diabetes 1.5, managed as type 1 (HCC)    Hypertension    Low back pain    PAD (peripheral artery disease) (HCC)    Tobacco abuse     Blood pressure 136/66, pulse 61, resp. rate 16, height 5\' 11"  (1.803 m), weight 127 lb (57.6 kg), SpO2 97 %.  Essential hypertension Patient with essential hypertension, doing better on current regimen of quinapril and amlodipine.  Will not make any changes today, as he did note dizziness and instability with full 5 mg dose of amlodipine.  For now he will continue to check home BP readings 3-4 times per week and keep written track.  Asked that he average the readings when fills a page and if those averages stay above 130/80 he should reach out to the office.     Tommy Medal PharmD CPP [facility] [address]  [place], [place] [address] [phone]

## 2021-06-17 NOTE — Patient Instructions (Signed)
Return for a a follow up appointment with Dr. Allyson Sabal in [facility]  Check your blood pressure at home 3-4 times per week (twice a day when you do) and keep record of the readings.  Take your BP meds as follows:  Continue with your current medications  Bring all of your meds, your BP cuff and your record of home blood pressures to your next appointment.  Exercise as you're able, try to walk approximately 30 minutes per day.  Keep salt intake to a minimum, especially watch canned and prepared boxed foods.  Eat more fresh fruits and vegetables and fewer canned items.  Avoid eating in fast food restaurants.    HOW TO TAKE YOUR BLOOD PRESSURE: Rest 5 minutes before taking your blood pressure.  Don't smoke or drink caffeinated beverages for at least 30 minutes before. Take your blood pressure before (not after) you eat. Sit comfortably with your back supported and both feet on the floor (don't cross your legs). Elevate your arm to heart level on a table or a desk. Use the proper sized cuff. It should fit smoothly and snugly around your bare upper arm. There should be enough room to slip a fingertip under the cuff. The bottom edge of the cuff should be 1 inch above the crease of the elbow. Ideally, take 3 measurements at one sitting and record the average.

## 2021-09-09 ENCOUNTER — Ambulatory Visit (INDEPENDENT_AMBULATORY_CARE_PROVIDER_SITE_OTHER): Payer: Medicare HMO | Admitting: Orthopedic Surgery

## 2021-09-09 ENCOUNTER — Other Ambulatory Visit: Payer: Self-pay

## 2021-09-09 DIAGNOSIS — S88111A Complete traumatic amputation at level between knee and ankle, right lower leg, initial encounter: Secondary | ICD-10-CM

## 2021-09-09 DIAGNOSIS — Z89511 Acquired absence of right leg below knee: Secondary | ICD-10-CM | POA: Diagnosis not present

## 2021-09-12 ENCOUNTER — Encounter: Payer: Self-pay | Admitting: Orthopedic Surgery

## 2021-09-12 NOTE — Progress Notes (Signed)
Office Visit Note   Patient: Peter Mendez.           Date of Birth: [birth year]           MRN: [id number] Visit Date: 09/09/2021              Requested by: Lin Landsman, [place],  [place] [address] PCP: Lin Landsman, MD  Chief Complaint  Patient presents with   Right Leg - Follow-up    09/18/2020 right BKA       HPI: Patient is a 61 year old gentleman status post right transtibial amputee.  Patient is currently subsiding and rubbing in his socket secondary to volume loss of the residual limb.  Patient has not been able to wear his prosthesis secondary to pain patient has been treating the ulcer with peroxide.  Assessment & Plan: Visit Diagnoses:  1. Below-knee amputation of right lower extremity ([place])     Plan: Recommended a new socket liner materials and supplies.  Recommend against using peroxide and not using the socket until the ulcer is healed.  Follow-Up Instructions: Return in about 4 weeks (around 10/07/2021).   Ortho Exam  Patient is alert, oriented, no adenopathy, well-dressed, normal affect, normal respiratory effort. Examination patient has a 2 cm ulcer over the fibular head from subsiding in the socket.  There is no cellulitis there is no exposed bone or tendon.  Patient is an existing right transtibial  amputee.  Patient's current comorbidities are not expected to impact the ability to function with the prescribed prosthesis. Patient verbally communicates a strong desire to use a prosthesis. Patient currently requires mobility aids to ambulate without a prosthesis.  Expects not to use mobility aids with a new prosthesis.  Patient is a K3 level ambulator that spends a lot of time walking around on uneven terrain over obstacles, up and down stairs, and ambulates with a variable cadence.     Imaging: No results found. No images are attached to the encounter.  Labs: Lab Results  Component Value Date   HGBA1C 8.8 (A) 07/21/2020   HGBA1C  9.3 (A) 04/28/2020   HGBA1C 8.7 (A) 01/21/2020   REPTSTATUS 09/13/2020 FINAL 09/08/2020   CULT  09/08/2020    NO GROWTH 5 DAYS Performed at [facility], [place] [address] [place], [place] [address]      Lab Results  Component Value Date   ALBUMIN 4.4 04/20/2021   ALBUMIN 2.1 (L) 09/21/2020   ALBUMIN 2.5 (L) 09/13/2020    Lab Results  Component Value Date   MG 1.9 09/24/2020   MG 2.1 09/23/2020   MG 1.9 09/22/2020   No results found for: VD25OH  No results found for: PREALBUMIN CBC EXTENDED Latest Ref Rng & Units 09/25/2020 09/24/2020 09/23/2020  WBC 4.0 - 10.5 K/uL 8.9 9.9 10.7(H)  RBC 4.22 - 5.81 MIL/uL 2.71(L) 2.51(L) 2.72(L)  HGB 13.0 - 17.0 g/dL 8.1(L) 7.6(L) 8.3(L)  HCT 39.0 - 52.0 % 24.5(L) 22.1(L) 23.5(L)  PLT 150 - 400 K/uL 59(L) 57(L) 48(L)  NEUTROABS 1.7 - 7.7 K/uL - - -  LYMPHSABS 0.7 - 4.0 K/uL - - -     There is no height or weight on file to calculate BMI.  Orders:  No orders of the defined types were placed in this encounter.  No orders of the defined types were placed in this encounter.    Procedures: No procedures performed  Clinical Data: No additional findings.  ROS:  All other  systems negative, except as noted in the HPI. Review of Systems  Objective: Vital Signs: There were no vitals taken for this visit.  Specialty Comments:  No specialty comments available.  PMFS History: Patient Active Problem List   Diagnosis Date Noted   Tobacco abuse 04/09/2021   IDA (iron deficiency anemia) 09/24/2020   Tinea cruris    HIT (heparin-induced thrombocytopenia)    Pain of right lower extremity    Atrial fibrillation (HCC)    Typical atrial flutter (HCC)    Pressure injury of skin 09/13/2020   PAOD (peripheral arterial occlusive disease) ([place])    Multiple wounds of skin    Lower limb ischemia 09/08/2020   Gangrene of right foot (HCC)    Chronic low back pain 04/27/2020   Diabetic peripheral neuropathy associated with type 1  diabetes mellitus ([place]) 05/23/2016   Chronic back pain 12/29/2015   Generalized anxiety disorder 12/29/2015   PVD (peripheral vascular disease) ([place]) 05/09/2014   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   Hyponatremia 02/25/2013   Essential hypertension 02/25/2013   Pure hypercholesterolemia 02/25/2013   Past Medical History:  Diagnosis Date   Chicken pox    Claudication ([place])    Diabetes 1.5, managed as type 1 ([place])    Hypertension    Low back pain    PAD (peripheral artery disease) ([place])    Tobacco abuse     Family History  Problem Relation Age of Onset   Diabetes Mother    Atrial fibrillation Mother    Heart attack Father    Stroke Father    Arthritis Father    Heart disease Father    Diabetes Maternal Uncle    Diabetes Maternal Grandmother    Heart attack Maternal Grandmother    Heart disease Maternal Grandmother    Esophageal cancer Maternal Grandfather    Heart attack Sister        at age 24    Past Surgical History:  Procedure Laterality Date   AMPUTATION Right 09/18/2020   Procedure: RIGHT BELOW KNEE AMPUTATION;  Surgeon: Newt Minion, MD;  Location: [facility];  Service: Orthopedics;  Laterality: Right;   AORTA - BILATERAL FEMORAL ARTERY BYPASS GRAFT Bilateral 09/11/2020   Procedure: AORTA BIFEMORAL BYPASS GRAFT;  Surgeon: Rosetta Posner, MD;  Location: [facility];  Service: Vascular;  Laterality: Bilateral;   CATARACT EXTRACTION Bilateral    FEMORAL-POPLITEAL BYPASS GRAFT Right 09/11/2020   Procedure: RIGHT FEMORAL-POPLITEAL ARTERY BYPASS GRAFTING;  Surgeon: Rosetta Posner, MD;  Location: [facility] OR;  Service: Vascular;  Laterality: Right;   INTRAOCULAR LENS INSERTION Bilateral    RETINAL DETACHMENT SURGERY Bilateral    Social History   Occupational History   Not on file  Tobacco Use   Smoking status: Every Day    Packs/day: 0.75    Years: 42.00    Pack years: 31.50    Types: Cigarettes   Smokeless tobacco: Never  Vaping Use   Vaping Use:  Never used  Substance and Sexual Activity   Alcohol use: No    Alcohol/week: 0.0 standard drinks   Drug use: Not Currently   Sexual activity: Not on file

## 2021-09-21 ENCOUNTER — Telehealth: Payer: Self-pay | Admitting: Cardiology

## 2021-09-21 ENCOUNTER — Telehealth: Payer: Self-pay | Admitting: Pharmacist Clinician (PhC)/ Clinical Pharmacy Specialist

## 2021-09-21 ENCOUNTER — Other Ambulatory Visit: Payer: Self-pay | Admitting: *Deleted

## 2021-09-21 MED ORDER — ROSUVASTATIN CALCIUM 40 MG PO TABS: 40.0000 mg | ORAL_TABLET | Freq: Every day | ORAL | 0 refills | Status: DC

## 2021-09-21 NOTE — Telephone Encounter (Signed)
Pt c/o medication issue:  1. Name of Medication: rosuvastatin (CRESTOR) 20 MG tablet  2. How are you currently taking this medication (dosage and times per day)? Take 1 tablet (20 mg total) by mouth daily  3. Are you having a reaction (difficulty breathing--STAT)? no  4. What is your medication issue? States got letter in the mail to take 40mg  twice a day but the prescription dont state that . Please advise

## 2021-09-21 NOTE — Telephone Encounter (Signed)
Script sent in Crestor 40 mg with no refills Pt has upcoming appt with Dr Allyson Sabal pt needs to keep ./cy

## 2021-09-21 NOTE — Telephone Encounter (Signed)
Patient calling to ask bout how his medication is supposed to be taking. Please advise

## 2021-09-23 NOTE — Telephone Encounter (Signed)
Patient had question about timing of rosuvastatin.  Assured him at night with Xarelto was fine.

## 2021-10-12 ENCOUNTER — Encounter: Payer: Self-pay | Admitting: Cardiovascular Disease

## 2021-10-12 ENCOUNTER — Other Ambulatory Visit: Payer: Self-pay

## 2021-10-12 ENCOUNTER — Ambulatory Visit (INDEPENDENT_AMBULATORY_CARE_PROVIDER_SITE_OTHER): Payer: Medicare HMO | Admitting: Cardiovascular Disease

## 2021-10-12 DIAGNOSIS — Z720 Tobacco use: Secondary | ICD-10-CM

## 2021-10-12 DIAGNOSIS — E7800 Pure hypercholesterolemia, unspecified: Secondary | ICD-10-CM | POA: Diagnosis not present

## 2021-10-12 DIAGNOSIS — I480 Paroxysmal atrial fibrillation: Secondary | ICD-10-CM | POA: Diagnosis not present

## 2021-10-12 DIAGNOSIS — I10 Essential (primary) hypertension: Secondary | ICD-10-CM

## 2021-10-12 DIAGNOSIS — I739 Peripheral vascular disease, unspecified: Secondary | ICD-10-CM | POA: Diagnosis not present

## 2021-10-12 MED ORDER — NICOTINE 21 MG/24HR TD PT24: 21.0000 mg | MEDICATED_PATCH | Freq: Every day | TRANSDERMAL | 1 refills | Status: AC

## 2021-10-12 NOTE — Assessment & Plan Note (Signed)
History of PAF maintaining sinus rhythm on Xarelto oral anticoagulation. °

## 2021-10-12 NOTE — Assessment & Plan Note (Signed)
History of essential hypertension a blood pressure measured today at 138/60.  [name] on amlodipine and ramipril. ?

## 2021-10-12 NOTE — Patient Instructions (Addendum)
Medication Instructions:  ? ?Start nicotine patch, one 21mg  patch every 24 hours. ? ?*If you need a refill on your cardiac medications before your next appointment, please call your pharmacy* ? ? ?Lab Work: ?Your physician recommends that you return for lab work in: 2 months for FASTING lipid/liver profile. ? ?If you have labs (blood work) drawn today and your tests are completely normal, you will receive your results only by: ?MyChart Message (if you have MyChart) OR ?A paper copy in the mail ?If you have any lab test that is abnormal or we need to change your treatment, we will call you to review the results. ? ? ?Testing/Procedures: ?Dr. has ordered a CT coronary calcium score.  ? ?[facility] location:  ?[facility] ([address] [place], [place]) ? ? ?This is $99 out of pocket. ? ? ?Coronary CalciumScan ?A coronary calcium scan is an imaging test used to look for deposits of calcium and other fatty materials (plaques) in the inner lining of the blood vessels of the heart (coronary arteries). These deposits of calcium and plaques can partly clog and narrow the coronary arteries without producing any symptoms or warning signs. This puts a person at risk for a heart attack. This test can detect these deposits before symptoms develop. ?Tell a health care provider about: ?Any allergies you have. ?All medicines you are taking, including vitamins, herbs, eye drops, creams, and over-the-counter medicines. ?Any problems you or family members have had with anesthetic medicines. ?Any blood disorders you have. ?Any surgeries you have had. ?Any medical conditions you have. ?Whether you are pregnant or may be pregnant. ?What are the risks? ?Generally, this is a safe procedure. However, problems may occur, including: ?Harm to a pregnant woman and her unborn baby. This test involves the use of radiation. Radiation exposure can be dangerous to a pregnant woman and her unborn baby. If you are pregnant, you  generally should not have this procedure done. ?Slight increase in the risk of cancer. This is because of the radiation involved in the test. ?What happens before the procedure? ?No preparation is needed for this procedure. ?What happens during the procedure? ?You will undress and remove any jewelry around your neck or chest. ?You will put on a hospital gown. ?Sticky electrodes will be placed on your chest. The electrodes will be connected to an electrocardiogram (ECG) machine to record a tracing of the electrical activity of your heart. ?A CT scanner will take pictures of your heart. During this time, you will be asked to lie still and hold your breath for 2-3 seconds while a picture of your heart is being taken. ?The procedure may vary among health care providers and hospitals. ?What happens after the procedure? ?You can get dressed. ?You can return to your normal activities. ?It is up to you to get the results of your test. Ask your health care provider, or the department that is doing the test, when your results will be ready. ?Summary ?A coronary calcium scan is an imaging test used to look for deposits of calcium and other fatty materials (plaques) in the inner lining of the blood vessels of the heart (coronary arteries). ?Generally, this is a safe procedure. Tell your health care provider if you are pregnant or may be pregnant. ?No preparation is needed for this procedure. ?A CT scanner will take pictures of your heart. ?You can return to your normal activities after the scan is done. ?This information is not intended to replace advice  given to you by your health care provider. Make sure you discuss any questions you have with your health care provider. ?Document Released: 01/21/2008 Document Revised: 06/13/2016 Document Reviewed: 06/13/2016 ?Elsevier Interactive Patient Education ? 2017 Elsevier Inc. ? ? ? ?Follow-Up: ?At [facility], you and your health needs are our priority.  As part of our continuing  mission to provide you with exceptional heart care, we have created designated Provider Care Teams.  These Care Teams include your primary Cardiologist (physician) and Advanced Practice Providers (APPs -  Physician Assistants and Nurse Practitioners) who all work together to provide you with the care you need, when you need it. ? ?We recommend signing up for the patient portal called "MyChart".  Sign up information is provided on this After Visit Summary.  MyChart is used to connect with patients for Virtual Visits (Telemedicine).  Patients are able to view lab/test results, encounter notes, upcoming appointments, etc.  Non-urgent messages can be sent to your provider as well.   ?To learn more about what you can do with MyChart, go to [email or web address].   ? ?Your next appointment:   ?6 month(s) ? ?The format for your next appointment:   ?In Person ? ?Provider:   ?Edd Fabian, FNP, Micah Flesher, PA-C, Marjie Skiff, PA-C, Juanda Crumble, PA-C, Joni Reining, DNP, ANP, Azalee Course, PA-C, or Bernadene Person, NP     ? ?Then, Nanetta Batty, MD will plan to see you again in 12 month(s). ?

## 2021-10-12 NOTE — Progress Notes (Signed)
? ? ? ?10/12/2021 ?Peter Mendez.   ?1961/06/22  ?[id number] ? ?Primary Physician Lin Landsman, MD ?Primary Cardiologist: Lorretta Harp MD Lupe Carney, [place] ? ?HPI:  Peter Mendez. is a 61 y.o.  thin appearing single Caucasian male with no children referred by [facility] at [facility] urgent care for evaluation of peripheral vascular disease.  [name] accompanied by his significant other Peter Mendez.  I last saw him in the office 04/09/2021.  His cardiovascular risk factor profile is remarkable for 40-pack-years of tobacco abuse currently smoking one pack per day, treated hypertension, type 1 diabetes as well as a family history of heart disease with a father who had a myocardial infarction at age 17. He has never had a heart attack or stroke. He denies chest pain or shortness of breath. He has been a type I diabetic for 25 or 30 years. He does have peripheral neuropathy as well as retinopathy. 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 ? ?He  was admitted in February of this year with critical limb ischemia.  He underwent aortobifemoral bypass grafting, right femoropopliteal bypass grafting and right below the knee amputation.  [name] recuperating nicely.  He has finished participating in physical therapy.  [name] cut down smoking down to only 4 cigarettes a day, however he is now up to 10 cigarettes a day.  He did have a 2D echocardiogram that revealed an EF of 45 to 50% with an anterior wall motion abnormality.  The patient denies chest pain.  Also had Merrin Mcvicker up A. fib maintaining sinus rhythm on Xarelto oral anticoagulation. ? ?Since I saw him 6 months ago he has completed physical therapy and Occupational Therapy.  [name] now walking without limitation with his prosthesis.  He denies chest pain or shortness of breath.  His rosuvastatin was recently increased by his PCP from 20 to 40 mg today. ?   ? ? ?Current Meds  ?Medication Sig  ? acetaminophen (TYLENOL) 500 MG tablet Take 1,000 mg by mouth every 6 (six) hours as needed for mild pain or headache.  ? ALPRAZolam (XANAX) 1 MG tablet Take 1 mg by mouth 3 (three) times daily.  ? amLODipine (NORVASC) 5 MG tablet Take 1 tablet (5 mg total) by mouth daily.  ? aspirin EC 81 MG EC tablet Take 1 tablet (81 mg total) by mouth daily. Swallow whole.  ? Continuous Blood Gluc Sensor (FREESTYLE LIBRE 14 DAY SENSOR) MISC 1 Units by Does not apply route every 14 (fourteen) days.  ? DROPLET PEN NEEDLES 31G X 8 MM MISC USE TO INJECT  INSULIN FIVE TIMES DAILY  ? fluconazole (DIFLUCAN) 150 MG tablet Take 1 tablet (150 mg total) by mouth once a week.  ? gabapentin (NEURONTIN) 300 MG capsule Take 1 capsule (300 mg total) by mouth 3 (three) times daily.  ? insulin aspart (NOVOLOG FLEXPEN) 100 UNIT/ML FlexPen Inject 4-6 Units into the skin 3 (three) times daily with meals.  ? insulin glargine (LANTUS SOLOSTAR) 100 UNIT/ML Solostar Pen INJECT 30 UNITS SUBCUTANEOUSLY IN THE MORNING AND 32 UNITS IN THE EVENING.  ? Lancets (ONETOUCH ULTRASOFT) lancets Use as instructed to check blood sugars 7 times per day  ? nicotine (NICODERM CQ - DOSED IN MG/24 HR) 7 mg/24hr patch Place 1 patch (7 mg total) onto the skin daily.  ? omeprazole (PRILOSEC) 20 MG capsule Take 20  mg by mouth daily.  ? oxyCODONE-acetaminophen (PERCOCET) 10-325 MG tablet Take 1 tablet by mouth 3 (three) times daily.  ? oxyCODONE-acetaminophen (PERCOCET/ROXICET) 5-325 MG tablet TAKE ONE TABLET EVERY 8 HOURS  ? ramipril (ALTACE) 10 MG capsule Take 1 capsule (10 mg total) by mouth daily.  ? rivaroxaban (XARELTO) 20 MG TABS tablet Take 1 tablet (20 mg total) by mouth daily with supper.  ? rosuvastatin (CRESTOR) 40 MG tablet Take 1 tablet (40 mg total) by mouth daily.  ?  ? ?Allergies  ?Allergen Reactions  ? Heparin Other (See Comments)  ?  Heparin (SRA positive)  ? Pravastatin Sodium Other (See Comments)  ?  myalgia   ? ? ?Social History  ? ?Socioeconomic History  ? Marital status: Single  ?  Spouse name: Not on file  ? Number of children: Not on file  ? Years of education: Not on file  ? Highest education level: Not on file  ?Occupational History  ? Not on file  ?Tobacco Use  ? Smoking status: Every Day  ?  Packs/day: 0.75  ?  Years: 42.00  ?  Pack years: 31.50  ?  Types: Cigarettes  ? Smokeless tobacco: Never  ?Vaping Use  ? Vaping Use: Never used  ?Substance and Sexual Activity  ? Alcohol use: No  ?  Alcohol/week: 0.0 standard drinks  ? Drug use: Not Currently  ? Sexual activity: Not on file  ?Other Topics Concern  ? Not on file  ?Social History Narrative  ? Not on file  ? ?Social Determinants of Health  ? ?Financial Resource Strain: Not on file  ?Food Insecurity: Not on file  ?Transportation Needs: Not on file  ?Physical Activity: Not on file  ?Stress: Not on file  ?Social Connections: Not on file  ?Intimate Partner Violence: Not on file  ?  ? ?Review of Systems: ?General: negative for chills, fever, night sweats or weight changes.  ?Cardiovascular: negative for chest pain, dyspnea on exertion, edema, orthopnea, palpitations, paroxysmal nocturnal dyspnea or shortness of breath ?Dermatological: negative for rash ?Respiratory: negative for cough or wheezing ?Urologic: negative for hematuria ?Abdominal: negative for nausea, vomiting, diarrhea, bright red blood per rectum, melena, or hematemesis ?Neurologic: negative for visual changes, syncope, or dizziness ?All other systems reviewed and are otherwise negative except as noted above. ? ? ? ?Blood pressure 138/60, pulse (!) 57, height 5\' 11"  (1.803 m), weight 132 lb (59.9 kg).  ?General appearance: alert and no distress ?Neck: no adenopathy, no carotid bruit, no JVD, supple, symmetrical, trachea midline, and thyroid not enlarged, symmetric, no tenderness/mass/nodules ?Lungs: clear to auscultation bilaterally ?Heart: regular rate and rhythm, S1, S2 normal, no murmur, click, rub  or gallop ?Extremities: Right BKA ?Pulses: Right BKA, diminished pedal pulses on left ?Skin: Skin color, texture, turgor normal. No rashes or lesions ?Neurologic: Grossly normal ? ?EKG sinus bradycardia at 57 with inferior Q waves and nonspecific ST and T wave changes.  He also had anteroseptal Q waves as well.  I personally reviewed this EKG ? ?ASSESSMENT AND PLAN:  ? ?Essential hypertension ?History of essential hypertension a blood pressure measured today at 138/60.  [name] on amlodipine and ramipril. ? ?Pure hypercholesterolemia ?History of hyperlipidemia with lipid profile performed 04/10/2021 revealing total cholesterol 161, LDL 85 and HDL 64.  His Crestor was recently increased from 20 to 40 mg a day.  We will recheck a lipid liver profile in 2 months. ? ?PVD (peripheral vascular disease) ([place]) ?History of PAD status post aortobifemoral bypass  grafting by Dr. Donnetta Hutching with right femoropopliteal bypass grafting and right below the knee amputation by Dr. Sharol Given.  He does wear a prosthesis.  [name] finished with physical therapy and Occupational Therapy and was able to walk without limitation. ? ?Atrial fibrillation ([place]) ?History of PAF maintaining sinus rhythm on Xarelto oral anticoagulation ? ?Tobacco abuse ?History of ongoing tobacco abuse.  He was smoking 4 cigarettes a day but now is up to 10.  He wishes to stop and request a NicoDerm patch. ? ? ? ? ?Lorretta Harp MD FACP,FACC,FAHA, FSCAI ?10/12/2021 ?10:27 AM ?

## 2021-10-12 NOTE — Assessment & Plan Note (Addendum)
History of PAD status post aortobifemoral bypass grafting by Dr. Donnetta Hutching with right femoropopliteal bypass grafting and right below the knee amputation by Dr. Sharol Given.  He does wear a prosthesis.  [name] finished with physical therapy and Occupational Therapy and was able to walk without limitation. ?

## 2021-10-12 NOTE — Assessment & Plan Note (Signed)
History of hyperlipidemia with lipid profile performed 04/10/2021 revealing total cholesterol 161, LDL 85 and HDL 64.  His Crestor was recently increased from 20 to 40 mg a day.  We will recheck a lipid liver profile in 2 months. ?

## 2021-10-12 NOTE — Assessment & Plan Note (Signed)
History of ongoing tobacco abuse.  He was smoking 4 cigarettes a day but now is up to 10.  He wishes to stop and request a NicoDerm patch. ?

## 2021-10-14 ENCOUNTER — Encounter: Payer: Self-pay | Admitting: Endocrinology

## 2021-10-14 ENCOUNTER — Other Ambulatory Visit: Payer: Self-pay

## 2021-10-14 ENCOUNTER — Ambulatory Visit (INDEPENDENT_AMBULATORY_CARE_PROVIDER_SITE_OTHER): Payer: Medicare HMO | Admitting: Endocrinology

## 2021-10-14 VITALS — BP 144/70 | HR 61 | Ht 71.0 in | Wt 131.0 lb

## 2021-10-14 DIAGNOSIS — I10 Essential (primary) hypertension: Secondary | ICD-10-CM | POA: Diagnosis not present

## 2021-10-14 DIAGNOSIS — E871 Hypo-osmolality and hyponatremia: Secondary | ICD-10-CM | POA: Diagnosis not present

## 2021-10-14 DIAGNOSIS — E1065 Type 1 diabetes mellitus with hyperglycemia: Secondary | ICD-10-CM

## 2021-10-14 LAB — MICROALBUMIN / CREATININE URINE RATIO
Creatinine,U: 90.6 mg/dL
Microalb Creat Ratio: 11.4 mg/g (ref 0.0–30.0)
Microalb, Ur: 10.4 mg/dL — ABNORMAL HIGH (ref 0.0–1.9)

## 2021-10-14 LAB — POCT GLYCOSYLATED HEMOGLOBIN (HGB A1C): Hemoglobin A1C: 9 % — AB (ref 4.0–5.6)

## 2021-10-14 LAB — POCT GLUCOSE (DEVICE FOR HOME USE): POC Glucose: 201 mg/dl — AB (ref 70–99)

## 2021-10-14 LAB — COMPREHENSIVE METABOLIC PANEL
ALT: 15 U/L (ref 0–53)
AST: 28 U/L (ref 0–37)
Albumin: 4 g/dL (ref 3.5–5.2)
Alkaline Phosphatase: 68 U/L (ref 39–117)
BUN: 13 mg/dL (ref 6–23)
CO2: 32 mEq/L (ref 19–32)
Calcium: 9.2 mg/dL (ref 8.4–10.5)
Chloride: 94 mEq/L — ABNORMAL LOW (ref 96–112)
Creatinine, Ser: 0.78 mg/dL (ref 0.40–1.50)
GFR: 95.81 mL/min (ref >60.00)
Glucose, Bld: 227 mg/dL — ABNORMAL HIGH (ref 70–99)
Potassium: 4.6 mEq/L (ref 3.5–5.1)
Sodium: 131 mEq/L — ABNORMAL LOW (ref 135–145)
Total Bilirubin: 0.8 mg/dL (ref 0.2–1.2)
Total Protein: 6.5 g/dL (ref 6.0–8.3)

## 2021-10-14 MED ORDER — NOVOLOG FLEXPEN 100 UNIT/ML ~~LOC~~ SOPN: 4.0000 [IU] | PEN_INJECTOR | Freq: Three times a day (TID) | SUBCUTANEOUS | 3 refills | Status: DC

## 2021-10-14 MED ORDER — FREESTYLE LIBRE 14 DAY SENSOR MISC: 1.0000 [IU] | 4 refills | Status: DC

## 2021-10-14 MED ORDER — ONETOUCH ULTRA VI STRP: ORAL_STRIP | 12 refills | Status: DC

## 2021-10-14 NOTE — Patient Instructions (Signed)
Lantus 10 units at pm ?

## 2021-10-14 NOTE — Progress Notes (Signed)
Patient ID: Peter NewnessEarl T Briggs Mendez., male   DOB: 08/13/1960, 61 y.o.   MRN: [id number]   Reason for Appointment: Endocrinology follow-up   History of Present Illness   Diagnosis: Type 1 diabetes mellitus, diagnosis 1991.  He has been on basal bolus insulin regimen for a few years His A1c previously was consistently around 7-7.5 He tends to have significant fluctuation in blood sugars at all times. He has difficulty keeping his postprandial blood sugars consistent with mealtime insulin coverage and estimating the amount of insulin needed for various kinds of foods since he does not count carbohydrates A1c has been higher since 2014  Recent history:   The insulin regimen is  Lantus 32--12/14 units at 10 AM and 10 PM; Novolog previously 3-5 acb 4-8 acl & acs    A1c is slightly higher at 9% again  Current diabetes management, problems identified and blood sugar patterns He has not been seen since 12/21  Since his last visit he has had a right below-knee amputation  Because of his not having consistent follow-up he has not been able to get some of his refills  He has not checked his blood sugar with the sensor for at least a couple of months  Also has been out of his NovoLog for about 2 months  [name] mostly checking his blood sugars with fingerstick meter from a relative  However not clear what his blood sugars are but he appears to be having highest readings after dinner with no NovoLog lately  Without any change in his overall insulin he is now tending to drop lower overnight with readings as low as 58 in the morning waking up today and reportedly occasional low sugars overnight  He was previously still using the original 14-day libre sensor and not the [place] 2  Also previously was taking his NovoLog based on his blood sugars also but not taking mostly 4 units before meals  Blood sugars by recall:  PRE-MEAL Fasting Lunch Dinner Bedtime Overall  Glucose range: 58-130 120-150 150  180-220   Mean/median:        Prior CGM data   CGM use % of time  98  2-week average/SD  175 GV 33  Time in range      52% was 40  % Time Above 180  34  % Time above 250  10  % Time Below 70 4     PRE-MEAL Fasting Lunch Dinner Bedtime Overall  Glucose range:       Averages:  167  154  174  197  175   POST-MEAL PC Breakfast PC Lunch PC Dinner  Glucose range:     Averages:  179  169 183    Hypoglycemia awareness: Usually develops symptoms when blood glucose is less than 50, usually sweating.   Meals: Inconsistent schedule, Inconsistent quantity. Breakfast is at 6-7 am Supper 6-7 pm  Variable intake at breakfast, some oatmeal; 1/2 sandwich or peanut butter crackers at bedtime   Dietician visit: Most recent:, 4/09.   Wt Readings from Last 3 Encounters:  10/14/21 131 lb (59.4 kg)  10/12/21 132 lb (59.9 kg)  06/17/21 127 lb (57.6 kg)    Lab Results  Component Value Date   HGBA1C 9.0 (A) 10/14/2021   HGBA1C 8.8 (A) 07/21/2020   HGBA1C 9.3 (A) 04/28/2020   Lab Results  Component Value Date   MICROALBUR 7.6 (H) 01/21/2020   LDLCALC 85 04/20/2021   CREATININE 0.75 (L) 04/22/2021  Other active problems: See review of systems     Allergies as of 10/14/2021       Reactions   Heparin Other (See Comments)   Heparin (SRA positive)   Pravastatin Sodium Other (See Comments)   myalgia        Medication List        Accurate as of October 14, 2021 12:52 PM. If you have any questions, ask your nurse or doctor.          acetaminophen 500 MG tablet Commonly known as: TYLENOL Take 1,000 mg by mouth every 6 (six) hours as needed for mild pain or headache.   ALPRAZolam 1 MG tablet Commonly known as: XANAX Take 1 mg by mouth 3 (three) times daily.   amLODipine 5 MG tablet Commonly known as: NORVASC Take 1 tablet (5 mg total) by mouth daily.   aspirin 81 MG EC tablet Take 1 tablet (81 mg total) by mouth daily. Swallow whole.   Droplet Pen Needles 31G X 8 MM  Misc Generic drug: Insulin Pen Needle USE TO INJECT  INSULIN FIVE TIMES DAILY   fluconazole 150 MG tablet Commonly known as: DIFLUCAN Take 1 tablet (150 mg total) by mouth once a week.   FreeStyle Libre 14 Day Sensor Misc 1 Units by Does not apply route every 14 (fourteen) days.   gabapentin 300 MG capsule Commonly known as: NEURONTIN Take 1 capsule (300 mg total) by mouth 3 (three) times daily.   Lantus SoloStar 100 UNIT/ML Solostar Pen Generic drug: insulin glargine INJECT 30 UNITS SUBCUTANEOUSLY IN THE MORNING AND 32 UNITS IN THE EVENING. What changed: additional instructions   nicotine 21 mg/24hr patch Commonly known as: NICODERM CQ - dosed in mg/24 hours Place 1 patch (21 mg total) onto the skin daily.   NovoLOG FlexPen 100 UNIT/ML FlexPen Generic drug: insulin aspart Inject 4-6 Units into the skin 3 (three) times daily with meals.   omeprazole 20 MG capsule Commonly known as: PRILOSEC Take 20 mg by mouth daily.   OneTouch Ultra test strip Generic drug: glucose blood Use to check blood sugar twice a day Started by: Reather Littler, MD   onetouch ultrasoft lancets Use as instructed to check blood sugars 7 times per day   oxyCODONE-acetaminophen 10-325 MG tablet Commonly known as: PERCOCET Take 1 tablet by mouth 3 (three) times daily. What changed: Another medication with the same name was removed. Continue taking this medication, and follow the directions you see here. Changed by: Reather Littler, MD   ramipril 10 MG capsule Commonly known as: ALTACE Take 1 capsule (10 mg total) by mouth daily.   rivaroxaban 20 MG Tabs tablet Commonly known as: XARELTO Take 1 tablet (20 mg total) by mouth daily with supper.   rosuvastatin 40 MG tablet Commonly known as: CRESTOR Take 1 tablet (40 mg total) by mouth daily.        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[facility])    Hypertension    Low back pain    PAD (peripheral artery disease) ([facility])    Tobacco abuse     Past Surgical History:  Procedure Laterality Date   AMPUTATION Right 09/18/2020   Procedure: RIGHT BELOW KNEE  AMPUTATION;  Surgeon: Nadara Mustard, MD;  Location[facility];  Service: Orthopedics;  Laterality: Right;   AORTA - BILATERAL FEMORAL ARTERY BYPASS GRAFT Bilateral 09/11/2020   Procedure: AORTA BIFEMORAL BYPASS GRAFT;  Surgeon: Larina Earthly, MD;  Location: [facility] OR;  Service: Vascular;  Laterality: Bilateral;   CATARACT EXTRACTION Bilateral    FEMORAL-POPLITEAL BYPASS GRAFT Right 09/11/2020   Procedure: RIGHT FEMORAL-POPLITEAL ARTERY BYPASS GRAFTING;  Surgeon: Larina Earthly, MD;  Location: [facility] OR;  Service: Vascular;  Laterality: Right;   INTRAOCULAR LENS INSERTION Bilateral    RETINAL DETACHMENT SURGERY Bilateral     Family History  Problem Relation Age of Onset   Diabetes Mother    Atrial fibrillation Mother    Heart attack Father    Stroke Father    Arthritis Father    Heart disease Father    Diabetes Maternal Uncle    Diabetes Maternal Grandmother    Heart attack Maternal Grandmother    Heart disease Maternal Grandmother    Esophageal cancer Maternal Grandfather    Heart attack Sister        at age 32    Social History:  reports that he has been smoking cigarettes. He has a 31.50 pack-year smoking history. He has never used smokeless tobacco. He reports that he does not currently use drugs. He reports that he does not drink alcohol.  Review of Systems   HYPERTENSION: Blood pressure is controlled although variable  Is on 10 mg ramipril and 5 mg amlodipine followed by cardiologist now  Also will check blood pressure at home, recent readings: 130s and 140s systolic  BP Readings from Last 3 Encounters:  10/14/21 (!) 144/70  10/12/21 138/60   06/17/21 136/66     Lipids: On Crestor 40 mg from cardiologist but no recent labs available   Lab Results  Component Value Date   CHOL 161 04/20/2021   HDL 64 04/20/2021   LDLCALC 85 04/20/2021   TRIG 60 04/20/2021   CHOLHDL 2.5 04/20/2021    HYPONATREMIA: Long-standing and asymptomatic.  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   Demeclocycline was prescribed previously but  not covered by insurance.  Cortrosyn stimulation test was also normal   Urinary sodium in 6/21 was <20 and U Osm 509  [name] asymptomatic with no nausea or decreased appetite even when his sodium was 125 in 9/21  Usually avoiding excess fluids   Lab Results  Component Value Date   CREATININE 0.75 (L) 04/22/2021   BUN 10 04/22/2021   NA 132 (L) 04/22/2021   K 5.0 04/22/2021   CL 90 (L) 04/22/2021   CO2 25 04/22/2021    Lung cancer screening: He had a CT scan done through the pulmonologist and does not have any significant lesions but 47-month exam is being scheduled  Chronic back pain. Is taking oxycodone for relief from pain clinic  Visual loss from retinopathy, currently not followed by ophthalmologist  He has had numbness and shooting pains in his legs, treated with gabapentin He says that he prefers 300 mg capsules instead of 600 which upsets his stomach  Has absent sensation in the feet on exam      Examination:   BP (!) 144/70    Pulse 61    Ht 5\' 11"  (1.803 m)    Wt 131 lb (59.4 kg)    SpO2  90%    BMI 18.27 kg/m   Body mass index is 18.27 kg/m.     Assesment/PLAN:  Diabetes type 1, long-standing on basal bolus insulin regimen   See history of present illness for detailed discussion of current blood sugar patterns, problems identified and current management  His A1c is 9%  His blood sugars are difficult to assess because of lack of adequate monitoring,  not getting any records of bring any meter today He has not followed up for several months and now not taking NovoLog also  Previously had been recommended Toujeo instead of Lantus to reduce variability but he continues to use Lantus Will need to start him back on CGM and report will be filled out when received He was advised to start taking NovoLog as before with an adjustment based on meal size and Premeal blood sugar Target blood sugar 180 or less after meals  Since he has relatively low fasting readings and overnight blood sugars we will reduce his Lantus to at least 10 units in the evening    2. . Hypertension: Blood pressure is fairly well controlled, higher today from anxiety Continue monitoring at home  3.  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  Also will need follow-up lipids and microalbumin    Patient Instructions  Lantus 10 units at pm   Reather Littler 10/14/2021, 12:52 PM

## 2021-10-25 ENCOUNTER — Other Ambulatory Visit: Payer: Self-pay

## 2021-10-25 DIAGNOSIS — E1065 Type 1 diabetes mellitus with hyperglycemia: Secondary | ICD-10-CM

## 2021-10-25 MED ORDER — RIVAROXABAN 20 MG PO TABS: 20.0000 mg | ORAL_TABLET | Freq: Every day | ORAL | 1 refills | Status: DC

## 2021-11-02 ENCOUNTER — Telehealth: Payer: Self-pay

## 2021-11-02 DIAGNOSIS — E1065 Type 1 diabetes mellitus with hyperglycemia: Secondary | ICD-10-CM

## 2021-11-02 MED ORDER — ACCU-CHEK GUIDE VI STRP: ORAL_STRIP | 3 refills | Status: DC

## 2021-11-02 MED ORDER — ACCU-CHEK GUIDE W/DEVICE KIT: PACK | 2 refills | Status: AC

## 2021-11-02 MED ORDER — ACCU-CHEK SOFTCLIX LANCETS MISC: 3 refills | Status: AC

## 2021-11-02 NOTE — Telephone Encounter (Signed)
Patient called in states that insurance will not cover his onetouch test strips. Rx sent for accu- check meter, lancets, and test strips. Pt also inquired about the [place] 2 that Dr Lucianne Muss sent in last visit. Informed patient it was sent to DME supplier and they will get in contact with him because he has to set up an acct. Patient states that someone did but he didn't know what it was for but he will contact them back ?

## 2021-11-22 ENCOUNTER — Inpatient Hospital Stay: Admission: RE | Admit: 2021-11-22 | Payer: Self-pay | Source: Ambulatory Visit

## 2021-11-25 ENCOUNTER — Ambulatory Visit
Admission: RE | Admit: 2021-11-25 | Discharge: 2021-11-25 | Disposition: A | Discharge status: Home / Self Care | Payer: Self-pay | Source: Ambulatory Visit | Attending: Cardiovascular Disease | Admitting: Cardiovascular Disease

## 2021-11-25 DIAGNOSIS — E7800 Pure hypercholesterolemia, unspecified: Secondary | ICD-10-CM

## 2021-11-25 DIAGNOSIS — Z720 Tobacco use: Secondary | ICD-10-CM

## 2021-11-25 DIAGNOSIS — I480 Paroxysmal atrial fibrillation: Secondary | ICD-10-CM

## 2021-11-25 DIAGNOSIS — I10 Essential (primary) hypertension: Secondary | ICD-10-CM

## 2021-11-25 DIAGNOSIS — I739 Peripheral vascular disease, unspecified: Secondary | ICD-10-CM

## 2021-11-30 NOTE — Telephone Encounter (Signed)
Patient states he checks blood sugar 6 times a day and wants a updated prescription. Is it ok to send? ?

## 2021-12-01 MED ORDER — ACCU-CHEK GUIDE VI STRP: ORAL_STRIP | 3 refills | Status: AC

## 2021-12-01 NOTE — Addendum Note (Signed)
Addended by: Eliseo Squires on: 12/01/2021 01:18 PM ? ? Modules accepted: Orders ? ?

## 2021-12-14 ENCOUNTER — Ambulatory Visit (INDEPENDENT_AMBULATORY_CARE_PROVIDER_SITE_OTHER): Payer: Medicare HMO | Admitting: Endocrinology

## 2021-12-14 ENCOUNTER — Encounter: Payer: Self-pay | Admitting: Endocrinology

## 2021-12-14 VITALS — BP 90/50 | HR 53 | Ht 71.0 in | Wt 131.8 lb

## 2021-12-14 DIAGNOSIS — E871 Hypo-osmolality and hyponatremia: Secondary | ICD-10-CM | POA: Diagnosis not present

## 2021-12-14 DIAGNOSIS — E1065 Type 1 diabetes mellitus with hyperglycemia: Secondary | ICD-10-CM

## 2021-12-14 DIAGNOSIS — I952 Hypotension due to drugs: Secondary | ICD-10-CM

## 2021-12-14 NOTE — Progress Notes (Signed)
? ? ?Patient ID: Peter Mendez., male   DOB: [birth year], 61 y.o.   MRN: [id number] ? ? ?Reason for Appointment: Endocrinology follow-up  ? ?History of Present Illness  ? ?Diagnosis: Type 1 diabetes mellitus, diagnosis 1991. ? ?He has been on basal bolus insulin regimen for a few years ?His A1c previously was consistently around 7-7.5 ?He tends to have significant fluctuation in blood sugars at all times. ?He has difficulty keeping his postprandial blood sugars consistent with mealtime insulin coverage and estimating the amount of insulin needed for various kinds of foods since he does not count carbohydrates ?A1c has been higher since 2014 ? ?Recent history:  ? ?The insulin regimen is  Lantus 32--10 units at 10 AM and 10 PM; Novolog previously 3-5 acb 4-8 acl & acs  ?  ?A1c is slightly higher at 9%  ? ?Current diabetes management, problems identified and blood sugar patterns ?He has not been able to get his freestyle libre for various reasons and he did not call again  ?For some reason his blood sugars are significantly higher recently and mostly high nonfasting ?Since his last visit he has taken his NovoLog more consistently but mostly taking 4 units and occasionally 6  ?At times will have very large meal of fried food and may not take enough insulin to cover the fluid ?Lowest blood sugar was 67 but likely took extra insulin the night before for high sugar ?Most of his blood sugars appear to be over 200 except 3 readings in the mornings ?No hypoglycemia recently except for the blood sugar of 67 and no nocturnal low sugars ?His weight is about the same ?Has not missed any doses of Lantus ? ?Blood sugars by meter review ? ? ?PRE-MEAL Fasting Lunch Dinner Bedtime Overall  ?Glucose range: 67-284 144-277 190-239    ?Mean/median:     198  ? ?POST-MEAL PC Breakfast PC Lunch PC Dinner  ?Glucose range:   202-271  ?Mean/median:     ? ? ?Prior CGM data ? ? ?CGM use % of time  98  ?2-week average/SD  175 GV 33  ?Time in  range      52% was 40  ?% Time Above 180  34  ?% Time above 250  10  ?% Time Below 70 4  ? ?  ?PRE-MEAL Fasting Lunch Dinner Bedtime Overall  ?Glucose range:       ?Averages:  167  154  174  197  175  ? ?POST-MEAL PC Breakfast PC Lunch PC Dinner  ?Glucose range:     ?Averages:  179  169 183  ? ? ?Hypoglycemia awareness: Usually develops symptoms when blood glucose is less than 50, usually sweating.  ? ?Meals: Inconsistent schedule, Inconsistent quantity. Breakfast is at 6-7 am Supper 6-7 pm  ?Variable intake at breakfast, some oatmeal; 1/2 sandwich or peanut butter crackers at bedtime ?  ?Dietician visit: Most recent:, 4/09.  ? ?Wt Readings from Last 3 Encounters:  ?12/14/21 131 lb 12.8 oz (59.8 kg)  ?10/14/21 131 lb (59.4 kg)  ?10/12/21 132 lb (59.9 kg)  ? ? ?Lab Results  ?Component Value Date  ? HGBA1C 9.0 (A) 10/14/2021  ? HGBA1C 8.8 (A) 07/21/2020  ? HGBA1C 9.3 (A) 04/28/2020  ? ?Lab Results  ?Component Value Date  ? MICROALBUR 10.4 (H) 10/14/2021  ? [place] 85 04/20/2021  ? CREATININE 0.78 10/14/2021  ? ? ?Other active problems: See review of systems ? ? ? ? ?Allergies as of 12/14/2021   ? ?  Reactions  ? Heparin Other (See Comments)  ? Heparin (SRA positive)  ? Pravastatin Sodium Other (See Comments)  ? myalgia  ? ?  ? ?  ?Medication List  ?  ? ?  ? Accurate as of Dec 14, 2021 11:59 PM. If you have any questions, ask your nurse or doctor.  ?  ?  ? ?  ? ?Accu-Chek Guide test strip ?Generic drug: glucose blood ?Use to check blood sugar 6 times a day before meals ?  ?Accu-Chek Guide w/Device Kit ?Use to check blood sugar daily ?  ?Accu-Chek Softclix Lancets lancets ?Use to check blood sugar 3 times a day before meals ?  ?acetaminophen 500 MG tablet ?Commonly known as: TYLENOL ?Take 1,000 mg by mouth every 6 (six) hours as needed for mild pain or headache. ?  ?ALPRAZolam 1 MG tablet ?Commonly known as: Duanne Moron ?Take 1 mg by mouth 3 (three) times daily. ?  ?amLODipine 5 MG tablet ?Commonly known as: NORVASC ?Take 1  tablet (5 mg total) by mouth daily. ?  ?aspirin 81 MG EC tablet ?Take 1 tablet (81 mg total) by mouth daily. Swallow whole. ?  ?Droplet Pen Needles 31G X 8 MM Misc ?Generic drug: Insulin Pen Needle ?USE TO INJECT  INSULIN FIVE TIMES DAILY ?  ?fluconazole 150 MG tablet ?Commonly known as: DIFLUCAN ?Take 1 tablet (150 mg total) by mouth once a week. ?  ?FreeStyle Libre 14 Day Sensor Misc ?1 Units by Does not apply route every 14 (fourteen) days. ?  ?gabapentin 300 MG capsule ?Commonly known as: NEURONTIN ?Take 1 capsule (300 mg total) by mouth 3 (three) times daily. ?  ?Lantus SoloStar 100 UNIT/ML Solostar Pen ?Generic drug: insulin glargine ?INJECT 30 UNITS SUBCUTANEOUSLY IN THE MORNING AND 32 UNITS IN THE EVENING. ?What changed: additional instructions ?  ?nicotine 21 mg/24hr patch ?Commonly known as: NICODERM CQ - dosed in mg/24 hours ?Place 1 patch (21 mg total) onto the skin daily. ?  ?NovoLOG FlexPen 100 UNIT/ML FlexPen ?Generic drug: insulin aspart ?Inject 4-6 Units into the skin 3 (three) times daily with meals. ?  ?omeprazole 20 MG capsule ?Commonly known as: PRILOSEC ?Take 20 mg by mouth daily. ?  ?oxyCODONE-acetaminophen 10-325 MG tablet ?Commonly known as: PERCOCET ?Take 1 tablet by mouth 3 (three) times daily. ?  ?ramipril 10 MG capsule ?Commonly known as: ALTACE ?Take 1 capsule (10 mg total) by mouth daily. ?  ?rivaroxaban 20 MG Tabs tablet ?Commonly known as: XARELTO ?Take 1 tablet (20 mg total) by mouth daily with supper. ?  ?rosuvastatin 40 MG tablet ?Commonly known as: CRESTOR ?Take 1 tablet (40 mg total) by mouth daily. ?  ? ?  ? ? ?Allergies:  ?Allergies  ?Allergen Reactions  ? Heparin Other (See Comments)  ?  Heparin (SRA positive)  ? Pravastatin Sodium Other (See Comments)  ?  myalgia  ? ? ?Past Medical History:  ?Diagnosis Date  ? Chicken pox   ? Claudication [facility])   ? Diabetes 1.5, managed as type 1 ([place])   ? Hypertension   ? Low back pain   ? PAD (peripheral artery disease) ([place])   ? Tobacco  abuse   ? ? ?Past Surgical History:  ?Procedure Laterality Date  ? AMPUTATION Right 09/18/2020  ? Procedure: RIGHT BELOW KNEE AMPUTATION;  Surgeon: Newt Minion, MD;  Location: [place];  Service: Orthopedics;  Laterality: Right;  ? AORTA - BILATERAL FEMORAL ARTERY BYPASS GRAFT Bilateral 09/11/2020  ? Procedure: AORTA BIFEMORAL BYPASS GRAFT;  Surgeon: Rosetta Posner, MD;  Location: [place];  Service: Vascular;  Laterality: Bilateral;  ? CATARACT EXTRACTION Bilateral   ? FEMORAL-POPLITEAL BYPASS GRAFT Right 09/11/2020  ? Procedure: RIGHT FEMORAL-POPLITEAL ARTERY BYPASS GRAFTING;  Surgeon: Rosetta Posner, MD;  Location: [place];  Service: Vascular;  Laterality: Right;  ? INTRAOCULAR LENS INSERTION Bilateral   ? RETINAL DETACHMENT SURGERY Bilateral   ? ? ?Family History  ?Problem Relation Age of Onset  ? Diabetes Mother   ? Atrial fibrillation Mother   ? Heart attack Father   ? Stroke Father   ? Arthritis Father   ? Heart disease Father   ? Diabetes Maternal Uncle   ? Diabetes Maternal Grandmother   ? Heart attack Maternal Grandmother   ? Heart disease Maternal Grandmother   ? Esophageal cancer Maternal Grandfather   ? Heart attack Sister   ?     at age 4  ? ? ?Social History:  reports that he has been smoking cigarettes. He has a 31.50 pack-year smoking history. He has never used smokeless tobacco. He reports that he does not currently use drugs. He reports that he does not drink alcohol. ? ?Review of Systems  ? ?HYPERTENSION: Blood pressure is controlled although variable ? ?Is on 10 mg ramipril and 5 mg amlodipine followed by cardiologist  ? ?Does check blood pressure at home, recent readings: 527P and 824M systolic ?However occasionally his blood pressure may be as low as 110 ?Today blood pressure is unusually low even without any symptoms ? ?BP Readings from Last 3 Encounters:  ?12/14/21 (!) 90/50  ?10/14/21 (!) 144/70  ?10/12/21 138/60  ? ? ? ?Lipids: On Crestor 40 mg from cardiologist but no recent labs available ? ? ?Lab  Results  ?Component Value Date  ? CHOL 161 04/20/2021  ? HDL 64 04/20/2021  ? [place] 85 04/20/2021  ? TRIG 60 04/20/2021  ? CHOLHDL 2.5 04/20/2021  ? ? ?HYPONATREMIA: Long-standing and asymptomatic. ? ?Lo

## 2021-12-14 NOTE — Patient Instructions (Addendum)
Call [facility] for [employer] sensor ? ?AM LANTUS 35 UNITS ? ?Keep Novolog at room Temp for 1 month ? ?Take 1/2 amlodipine only in pm ?

## 2021-12-22 LAB — LIPID PANEL
Chol/HDL Ratio: 2.1 ratio (ref 0.0–5.0)
Cholesterol, Total: 115 mg/dL (ref 100–199)
HDL: 54 mg/dL (ref >39)
LDL Chol Calc (NIH): 51 mg/dL (ref 0–99)
Triglycerides: 36 mg/dL (ref 0–149)
VLDL Cholesterol Cal: 10 mg/dL (ref 5–40)

## 2021-12-22 LAB — HEPATIC FUNCTION PANEL
ALT: 12 IU/L (ref 0–44)
AST: 21 IU/L (ref 0–40)
Albumin: 3.9 g/dL (ref 3.8–4.8)
Alkaline Phosphatase: 69 IU/L (ref 44–121)
Bilirubin Total: 0.3 mg/dL (ref 0.0–1.2)
Bilirubin, Direct: 0.13 mg/dL (ref 0.00–0.40)
Total Protein: 6.1 g/dL (ref 6.0–8.5)

## 2021-12-23 ENCOUNTER — Other Ambulatory Visit: Payer: Self-pay

## 2021-12-23 DIAGNOSIS — E1065 Type 1 diabetes mellitus with hyperglycemia: Secondary | ICD-10-CM

## 2021-12-23 MED ORDER — LANTUS SOLOSTAR 100 UNIT/ML ~~LOC~~ SOPN: PEN_INJECTOR | SUBCUTANEOUS | 1 refills | Status: DC

## 2022-02-24 ENCOUNTER — Encounter: Payer: Self-pay | Admitting: Endocrinology

## 2022-02-24 ENCOUNTER — Ambulatory Visit (INDEPENDENT_AMBULATORY_CARE_PROVIDER_SITE_OTHER): Payer: Medicare HMO | Admitting: Endocrinology

## 2022-02-24 VITALS — BP 128/64 | HR 54 | Ht 71.0 in | Wt 132.2 lb

## 2022-02-24 DIAGNOSIS — E1065 Type 1 diabetes mellitus with hyperglycemia: Secondary | ICD-10-CM | POA: Diagnosis not present

## 2022-02-24 LAB — POCT GLYCOSYLATED HEMOGLOBIN (HGB A1C): Hemoglobin A1C: 9.3 % — AB (ref 4.0–5.6)

## 2022-02-24 NOTE — Patient Instructions (Addendum)
Novolog 6-7 at supper  Lantus 35 in am and 6 at nite

## 2022-02-24 NOTE — Progress Notes (Signed)
Patient ID: Peter Rams., male   DOB: May 01, 1961, 61 y.o.   MRN: [id number]   Reason for Appointment: Endocrinology follow-up   History of Present Illness   Diagnosis: Type 1 diabetes mellitus, diagnosis 1991.  He has been on basal bolus insulin regimen for a few years His A1c previously was consistently around 7-7.5 He tends to have significant fluctuation in blood sugars at all times. He has difficulty keeping his postprandial blood sugars consistent with mealtime insulin coverage and estimating the amount of insulin needed for various kinds of foods since he does not count carbohydrates A1c has been higher since 2014  Recent history:   The insulin regimen is  Lantus 32--8 units at 7 AM and 7 PM; Novolog previously 3-5 acb 4-8 acl & acs    A1c is slightly higher at 9.3 %   Current diabetes management, problems identified and blood sugar patterns He still has not been able to get his freestyle libre for various reasons and no paperwork for this is available He did not continue his Lantus to 35 as directed but he is still reducing his evening Lantus by 2 units Also he will go a is mostly having readings over 200 after dinner he has not been increasing his NovoLog coverage at dinnertime  He says that he is generally eating small meals or snacking during the day and eating mostly a complete meal at dinnertime  Currently checking blood sugars generally 4 times a day Blood sugars at lunch and dinnertime are variable but generally blood sugars are mostly high before dinner; also difficult to make sure which readings are before or after eating in the evening Generally fasting blood sugars appear to be very low but frequently may be low normal with the lowest reading 60 about a week ago No nocturnal hypoglycemia No recent weight change  Blood sugars by Accu-Chek meter review   PRE-MEAL Fasting Lunch Dinner Bedtime Overall  Glucose range: 60-182 83-282 108-223    Mean/median:         POST-MEAL PC Breakfast PC Lunch PC Dinner  Glucose range:   203-318  Mean/median:      Previously:  PRE-MEAL Fasting Lunch Dinner Bedtime Overall  Glucose range: 67-284 144-277 190-239    Mean/median:     198   POST-MEAL PC Breakfast PC Lunch PC Dinner  Glucose range:   202-271  Mean/median:       Prior CGM data   CGM use % of time  98  2-week average/SD  175 GV 33  Time in range      52% was 40  % Time Above 180  34  % Time above 250  10  % Time Below 70 4     PRE-MEAL Fasting Lunch Dinner Bedtime Overall  Glucose range:       Averages:  167  154  174  197  175   POST-MEAL PC Breakfast PC Lunch PC Dinner  Glucose range:     Averages:  179  169 183    Hypoglycemia awareness: Usually develops symptoms when blood glucose is less than 50, usually sweating.   Meals: Inconsistent schedule, Inconsistent quantity. Breakfast is at 6-7 am Supper 6-7 pm  Variable intake at breakfast, some oatmeal; 1/2 sandwich or peanut butter crackers at bedtime   Dietician visit: Most recent:, 4/09.   Wt Readings from Last 3 Encounters:  02/24/22 132 lb 3.2 oz (60 kg)  12/14/21 131 lb 12.8 oz (59.8 kg)  10/14/21 131 lb (59.4 kg)    Lab Results  Component Value Date   HGBA1C 9.3 (A) 02/24/2022   HGBA1C 9.0 (A) 10/14/2021   HGBA1C 8.8 (A) 07/21/2020   Lab Results  Component Value Date   MICROALBUR 10.4 (H) 10/14/2021   LDLCALC 51 12/22/2021   CREATININE 0.78 10/14/2021    Other active problems: See review of systems     Allergies as of 02/24/2022       Reactions   Heparin Other (See Comments)   Heparin (SRA positive)   Pravastatin Sodium Other (See Comments)   myalgia        Medication List        Accurate as of February 24, 2022 11:59 PM. If you have any questions, ask your nurse or doctor.          Accu-Chek Guide test strip Generic drug: glucose blood Use to check blood sugar 6 times a day before meals   Accu-Chek Guide w/Device Kit Use to check  blood sugar daily   Accu-Chek Softclix Lancets lancets Use to check blood sugar 3 times a day before meals   acetaminophen 500 MG tablet Commonly known as: TYLENOL Take 1,000 mg by mouth every 6 (six) hours as needed for mild pain or headache.   ALPRAZolam 1 MG tablet Commonly known as: XANAX Take 1 mg by mouth 3 (three) times daily.   amLODipine 5 MG tablet Commonly known as: NORVASC Take 1 tablet (5 mg total) by mouth daily.   aspirin EC 81 MG tablet Take 1 tablet (81 mg total) by mouth daily. Swallow whole.   Droplet Pen Needles 31G X 8 MM Misc Generic drug: Insulin Pen Needle USE TO INJECT  INSULIN FIVE TIMES DAILY   fluconazole 150 MG tablet Commonly known as: DIFLUCAN Take 1 tablet (150 mg total) by mouth once a week.   FreeStyle Libre 14 Day Sensor Misc 1 Units by Does not apply route every 14 (fourteen) days.   gabapentin 300 MG capsule Commonly known as: NEURONTIN Take 1 capsule (300 mg total) by mouth 3 (three) times daily.   Lantus SoloStar 100 UNIT/ML Solostar Pen Generic drug: insulin glargine INJECT 30 UNITS SUBCUTANEOUSLY IN THE MORNING AND 32 UNITS IN THE EVENING. What changed: additional instructions   nicotine 21 mg/24hr patch Commonly known as: NICODERM CQ - dosed in mg/24 hours Place 1 patch (21 mg total) onto the skin daily.   NovoLOG FlexPen 100 UNIT/ML FlexPen Generic drug: insulin aspart Inject 4-6 Units into the skin 3 (three) times daily with meals.   omeprazole 20 MG capsule Commonly known as: PRILOSEC Take 20 mg by mouth daily.   oxyCODONE-acetaminophen 10-325 MG tablet Commonly known as: PERCOCET Take 1 tablet by mouth 3 (three) times daily.   ramipril 10 MG capsule Commonly known as: ALTACE Take 1 capsule (10 mg total) by mouth daily.   rivaroxaban 20 MG Tabs tablet Commonly known as: XARELTO Take 1 tablet (20 mg total) by mouth daily with supper.   rosuvastatin 40 MG tablet Commonly known as: CRESTOR Take 1 tablet (40 mg  total) by mouth daily.        Allergies:  Allergies  Allergen Reactions   Heparin Other (See Comments)    Heparin (SRA positive)   Pravastatin Sodium Other (See Comments)    myalgia    Past Medical History:  Diagnosis Date   Chicken pox    Claudication ([facility])    Diabetes 1.5, managed as type 1 ([place])  Hypertension    Low back pain    PAD (peripheral artery disease) ([place])    Tobacco abuse     Past Surgical History:  Procedure Laterality Date   AMPUTATION Right 09/18/2020   Procedure: RIGHT BELOW KNEE AMPUTATION;  Surgeon: Newt Minion, MD;  Location: [place];  Service: Orthopedics;  Laterality: Right;   AORTA - BILATERAL FEMORAL ARTERY BYPASS GRAFT Bilateral 09/11/2020   Procedure: AORTA BIFEMORAL BYPASS GRAFT;  Surgeon: Rosetta Posner, MD;  [facility];  Service: Vascular;  Laterality: Bilateral;   CATARACT EXTRACTION Bilateral    FEMORAL-POPLITEAL BYPASS GRAFT Right 09/11/2020   Procedure: RIGHT FEMORAL-POPLITEAL ARTERY BYPASS GRAFTING;  Surgeon: Rosetta Posner, MD;  Location: [facility];  Service: Vascular;  Laterality: Right;   INTRAOCULAR LENS INSERTION Bilateral    RETINAL DETACHMENT SURGERY Bilateral     Family History  Problem Relation Age of Onset   Diabetes Mother    Atrial fibrillation Mother    Heart attack Father    Stroke Father    Arthritis Father    Heart disease Father    Diabetes Maternal Uncle    Diabetes Maternal Grandmother    Heart attack Maternal Grandmother    Heart disease Maternal Grandmother    Esophageal cancer Maternal Grandfather    Heart attack Sister        at age 2    Social History:  reports that he has been smoking cigarettes. He has a 31.50 pack-year smoking history. He has never used smokeless tobacco. He reports that he does not currently use drugs. He reports that he does not drink alcohol.  Review of Systems   HYPERTENSION: Blood pressure is controlled although variable  Is on 10 mg ramipril and 5 mg amlodipine followed by  cardiologist   Does check blood pressure at home  BP Readings from Last 3 Encounters:  02/24/22 128/64  12/14/21 (!) 90/50  10/14/21 (!) 144/70     Lipids: On Crestor 40 mg from cardiologist     Lab Results  Component Value Date   CHOL 115 12/22/2021   HDL 54 12/22/2021   LDLCALC 51 12/22/2021   TRIG 36 12/22/2021   CHOLHDL 2.1 12/22/2021    HYPONATREMIA: Long-standing and asymptomatic.  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   Demeclocycline was prescribed previously but  not covered by insurance.  Cortrosyn stimulation test was also normal   Urinary sodium in 6/21 was <20 and U Osm 509  [name] asymptomatic with no nausea or decreased appetite even when his sodium was 125 in 9/21  Usually avoiding excess liquids during the day Last sodium as follows:   Lab Results  Component Value Date   CREATININE 0.78 10/14/2021   BUN 13 10/14/2021   NA 131 (L) 10/14/2021   K 4.6 10/14/2021   CL 94 (L) 10/14/2021   CO2 32 10/14/2021    Lung cancer screening: He had a CT scan done through the pulmonologist and does not have any significant lesions but 66-monthexam is being scheduled  Chronic back pain. 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     Assesment/PLAN:  Diabetes type 1, long-standing on basal bolus insulin regimen   See history of  present illness for detailed discussion of current blood sugar patterns, problems identified and current management  His A1c is 9.3 compared to 9%  His blood sugars are not well controlled and A1c is higher than expected from recent blood sugars  Most of his hyperglycemia appears to be after dinner Also can do better with more complete assessment of his blood sugar patterns with his freestyle libre which he still is not able to get   Does appear to be needing higher amount of basal insulin during the day and less in the evening and he is still taking less insulin than prescribed in the morning His diet has been variable and blood sugars appear to be more consistently high after dinner from inadequate NovoLog  Changes made: Novolog 6-7 at supper  Lantus 35 in am and 6 at nite  Prescription for freestyle libre reader and sensor to be sent through [employer] to provide his supplies To call if his blood sugars are consistently high or low    2. . Hypertension: Blood pressure is well controlled   3.  SIADH causing hyponatremia: Needs follow-up sodium checked today   4.  Also will need follow-up  microalbumin    Patient Instructions  Novolog 6-7 at supper  Lantus 35 in am and 6 at [facility] 02/25/2022, 11:58 AM

## 2022-02-25 LAB — BASIC METABOLIC PANEL
BUN: 8 mg/dL (ref 6–23)
CO2: 31 mEq/L (ref 19–32)
Calcium: 9.3 mg/dL (ref 8.4–10.5)
Chloride: 91 mEq/L — ABNORMAL LOW (ref 96–112)
Creatinine, Ser: 0.73 mg/dL (ref 0.40–1.50)
GFR: 97.5 mL/min (ref >60.00)
Glucose, Bld: 228 mg/dL — ABNORMAL HIGH (ref 70–99)
Potassium: 5.1 mEq/L (ref 3.5–5.1)
Sodium: 127 mEq/L — ABNORMAL LOW (ref 135–145)

## 2022-02-27 NOTE — Progress Notes (Signed)
Sodium is lower at 127, needs to cut back on fluids overall and also minimize pain medication if possible, needs to come back for follow-up for recheck sodium in 4 weeks

## 2022-03-01 ENCOUNTER — Other Ambulatory Visit: Payer: Self-pay | Admitting: Endocrinology

## 2022-03-01 ENCOUNTER — Telehealth: Payer: Self-pay

## 2022-03-01 DIAGNOSIS — E871 Hypo-osmolality and hyponatremia: Secondary | ICD-10-CM

## 2022-03-01 NOTE — Telephone Encounter (Signed)
Patient called in states that he would really like to have [place] sensor. Is it ok to send?

## 2022-03-09 IMAGING — CT CT ANGIO AOBIFEM WO/W CM
1 of 7 series · 4 of 16 positions shown, 5 images · IV contrast (OMNI 350)
Comparison: None.

CLINICAL DATA: Claudication/leg ischemia

Right foot pain and discoloration
EXAM:
CT ANGIOGRAPHY OF ABDOMINAL AORTA WITH ILIOFEMORAL RUNOFF
TECHNIQUE: Multidetector CT imaging of the abdomen, pelvis and lower
extremities was performed using the standard protocol during bolus
administration of intravenous contrast. Multiplanar CT image
reconstructions and MIPs were obtained to evaluate the vascular
anatomy.
CONTRAST:  100mL OMNIPAQUE IOHEXOL 350 MG/ML SOLN

[Series 5: cta runoff (id) · axial · 0.77mm/px · z∈[+207,+1062]mm · 4 of 475 slices shown, 5 images]
[im 95/475  soft-tissue]
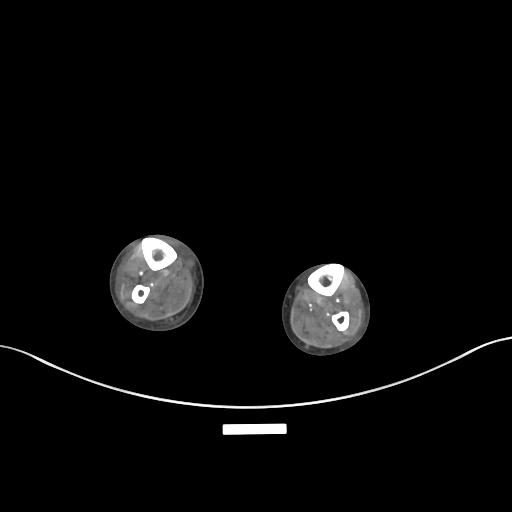
[im 95/475  bone]
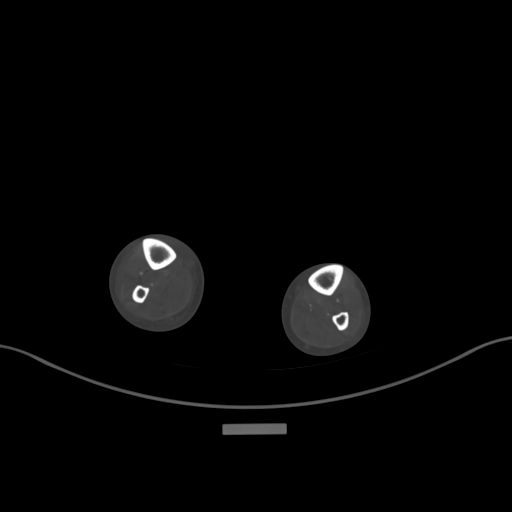
[im 190/475  soft-tissue]
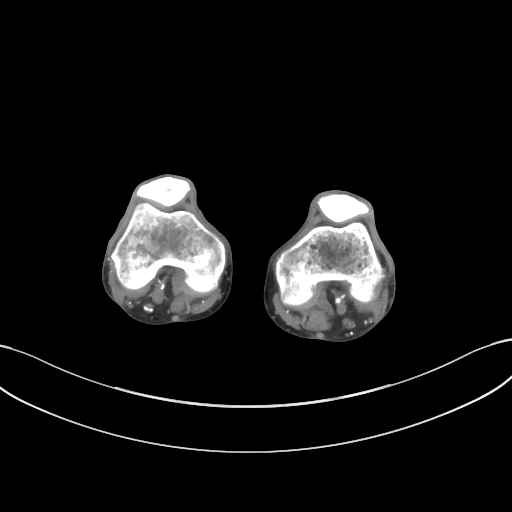
[im 285/475  soft-tissue]
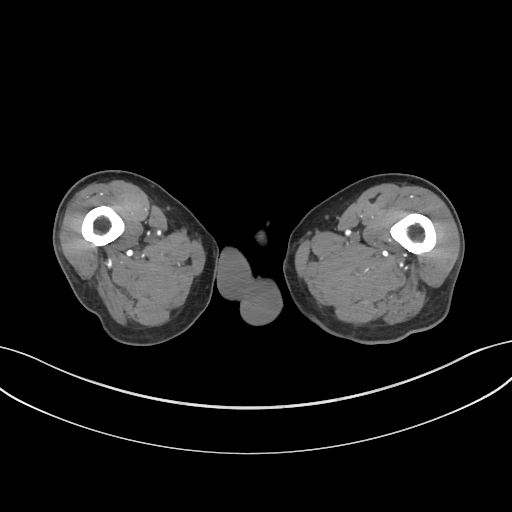
[im 380/475  soft-tissue]
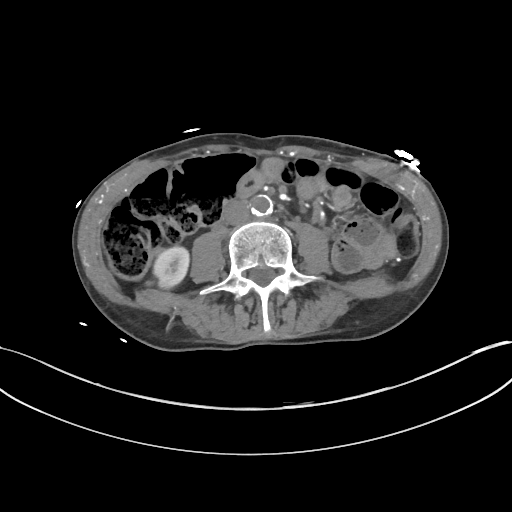

[4 of 16 positions shown; findings below may reference images not displayed]

FINDINGS: VASCULAR

Aorta: The infrarenal abdominal aorta is occluded. Suprarenal
abdominal aorta is patent with scattered atherosclerotic plaque. No
aneurysm. No dissection of the suprarenal abdominal aorta.

Celiac: Mild stenosis at the origin. Otherwise patent. There is
moderate to severe stenosis at the origin of the common hepatic
artery. The right hepatic artery is replaced from the superior
mesenteric.

SMA: Patent without evidence of aneurysm, dissection, vasculitis or
significant stenosis.

Renals: Mild stenosis at the origin of both renal arteries secondary
to calcified plaque.

IMA: Occluded origin and proximal segment. Opacification of distal
segments indicative of retrograde collateral flow.

RIGHT Lower Extremity

Inflow: Common and external iliac arteries are occluded. Proximal
left internal iliac artery is occluded. Opacification of distal
branches indicative of retrograde collateral flow.

Outflow: Flow reconstitutes within the right common femoral artery
which is diffusely narrowed due to calcified plaque. Mild narrowing
at the origin of the profunda femorals artery which is otherwise
patent. The proximal 5 cm of the SFA is patent but diffusely
diseased. The remainder of the right superficial femoral artery is
occluded. Flow reconstitutes within the right popliteal artery which
is diminutive but patent.

Runoff: Tibioperoneal trunk is patent. There is 2 vessel runoff to
the right ankle through the anterior tibial and peroneal arteries.
Posterior tibial artery is occluded beyond the mid lower leg.

LEFT Lower Extremity

Inflow: left common and external iliac arteries are occluded. The
left common iliac artery appears severely narrowed. Proximal
internal iliac artery is occluded. Opacification of distal branches
indicative retrograde collateral flow.

Outflow: Flow reconstitutes within the left common femoral artery.
Scattered atheromatous plaque without high-grade stenosis of the
left common femoral artery. Profundus femorals artery is patent
without significant narrowing. There is severe stenosis of the
origin of the left superficial femoral artery. The distal half of
the SFA is heavily diseased with multiple foci of near complete
occlusion. Pelvic tail artery is diminutive but patent.

Runoff: Severe stenosis of the distal tibioperoneal trunk due to
calcified plaque. Severe stenosis at the origin of the anterior
tibial artery which is otherwise patent. There is three-vessel
runoff to the left ankle.

Veins: No obvious venous abnormality within the limitations of this
arterial phase study.

Review of the MIP images confirms the above findings.

NON-VASCULAR

Lower chest: Minimal bibasilar atelectasis. Mild emphysematous
changes seen at the lung bases. Advanced coronary artery
calcifications. Heart size within normal limits.

Hepatobiliary: No focal hepatic lesion. No bile duct dilatation.
Minimal cholelithiasis. Gallbladder otherwise unremarkable.

Pancreas: No significant abnormality. Evaluation limited due to lack
of intraperitoneal fat and arterial phase imaging.

Spleen: Normal in size without focal abnormality.

Adrenals/Urinary Tract: 11 mm right adrenal nodule is unchanged in
size compared to chest CT from 04/29/2020. The internal density of
the lesion on the chest CT is consistent with adenoma. The adrenal
glands are thickened is indicative of hyperplasia.

Kidneys, ureters, and bladder are normal.

Stomach/Bowel: Stomach is within normal limits. Appendix appears
normal. No evidence of bowel wall thickening, distention, or
inflammatory changes.

Lymphatic: No significant vascular findings are present. No enlarged
abdominal or pelvic lymph nodes.

Reproductive: Mildly enlarged prostate.

Other: No abdominal wall hernia or abnormality. No abdominopelvic
ascites.

Musculoskeletal: Deformity of the sternum consistent with prior
healed fracture. Otherwise no acute abnormality.
IMPRESSION: VASCULAR

1. Occluded infrarenal abdominal aorta and bilateral common and
external iliac arteries. Flow reconstitutes in the common femoral
arteries from prominent inferior epigastric and deep circumflex
iliac artery branches. High-grade stenosis noted at the junction of
the inferior epigastric arteries and common femoral arteries
bilaterally.
2. Right superficial femoral artery is occluded with exception of
the proximal 5 cm.
3. Heavily diseased left superficial femoral artery with multiple
foci of near complete occlusion in the distal half.

NON-VASCULAR

No acute abnormality of the abdomen or pelvis.

## 2022-03-17 ENCOUNTER — Telehealth: Payer: Self-pay | Admitting: Cardiovascular Disease

## 2022-03-17 MED ORDER — RAMIPRIL 10 MG PO CAPS: 10.0000 mg | ORAL_CAPSULE | Freq: Every day | ORAL | 3 refills | Status: DC

## 2022-03-17 NOTE — Telephone Encounter (Signed)
*  STAT* If patient is at the pharmacy, call can be transferred to refill team.   1. Which medications need to be refilled? (please list name of each medication and dose if known) ramipril (ALTACE) 10 MG capsule  2. Which pharmacy/location (including street and city if local pharmacy) is medication to be sent to? [facility] - [place], [place] - [address]  3. Do they need a 30 day or 90 day supply? 90 day supply

## 2022-03-30 ENCOUNTER — Other Ambulatory Visit (INDEPENDENT_AMBULATORY_CARE_PROVIDER_SITE_OTHER): Payer: Medicare HMO

## 2022-03-30 DIAGNOSIS — E871 Hypo-osmolality and hyponatremia: Secondary | ICD-10-CM

## 2022-03-30 LAB — BASIC METABOLIC PANEL
BUN: 7 mg/dL (ref 6–23)
CO2: 30 mEq/L (ref 19–32)
Calcium: 8.9 mg/dL (ref 8.4–10.5)
Chloride: 93 mEq/L — ABNORMAL LOW (ref 96–112)
Creatinine, Ser: 0.72 mg/dL (ref 0.40–1.50)
GFR: 97.84 mL/min (ref >60.00)
Glucose, Bld: 189 mg/dL — ABNORMAL HIGH (ref 70–99)
Potassium: 4.1 mEq/L (ref 3.5–5.1)
Sodium: 128 mEq/L — ABNORMAL LOW (ref 135–145)

## 2022-03-31 ENCOUNTER — Other Ambulatory Visit: Payer: Self-pay | Admitting: Endocrinology

## 2022-03-31 MED ORDER — UREA POWD: 1 refills | Status: DC

## 2022-03-31 NOTE — Progress Notes (Signed)
His sodium is not improving and still low at 128, we will need to try a new medication which is urea powder and will send this prescription.  He needs to come back in another month to follow-up labs again

## 2022-05-02 ENCOUNTER — Other Ambulatory Visit: Payer: Self-pay | Admitting: Endocrinology

## 2022-05-02 DIAGNOSIS — E871 Hypo-osmolality and hyponatremia: Secondary | ICD-10-CM

## 2022-05-04 ENCOUNTER — Other Ambulatory Visit (INDEPENDENT_AMBULATORY_CARE_PROVIDER_SITE_OTHER): Payer: Medicare HMO

## 2022-05-04 DIAGNOSIS — E871 Hypo-osmolality and hyponatremia: Secondary | ICD-10-CM

## 2022-05-05 LAB — BASIC METABOLIC PANEL
BUN: 9 mg/dL (ref 6–23)
CO2: 31 mEq/L (ref 19–32)
Calcium: 8.7 mg/dL (ref 8.4–10.5)
Chloride: 94 mEq/L — ABNORMAL LOW (ref 96–112)
Creatinine, Ser: 0.86 mg/dL (ref 0.40–1.50)
GFR: 92.66 mL/min (ref >60.00)
Glucose, Bld: 277 mg/dL — ABNORMAL HIGH (ref 70–99)
Potassium: 4.7 mEq/L (ref 3.5–5.1)
Sodium: 129 mEq/L — ABNORMAL LOW (ref 135–145)

## 2022-05-06 NOTE — Progress Notes (Signed)
Sodium still about the same at 129, need to confirm that he is using urea as prescribed

## 2022-05-10 ENCOUNTER — Other Ambulatory Visit: Payer: Self-pay

## 2022-05-10 DIAGNOSIS — E871 Hypo-osmolality and hyponatremia: Secondary | ICD-10-CM

## 2022-05-10 MED ORDER — UREA POWD: 1 refills | Status: AC

## 2022-05-11 ENCOUNTER — Other Ambulatory Visit: Payer: Self-pay | Admitting: Endocrinology

## 2022-05-11 DIAGNOSIS — E1065 Type 1 diabetes mellitus with hyperglycemia: Secondary | ICD-10-CM

## 2022-06-09 ENCOUNTER — Ambulatory Visit: Payer: Medicare HMO | Admitting: Endocrinology

## 2022-06-09 ENCOUNTER — Telehealth: Payer: Self-pay

## 2022-06-09 NOTE — Telephone Encounter (Signed)
Patient had family bring [employer] reader by to download and see if any changes need to made to insulin. I have placed readings on desk.

## 2022-08-09 ENCOUNTER — Ambulatory Visit (INDEPENDENT_AMBULATORY_CARE_PROVIDER_SITE_OTHER): Payer: Medicare HMO | Admitting: Endocrinology

## 2022-08-09 ENCOUNTER — Encounter: Payer: Self-pay | Admitting: Endocrinology

## 2022-08-09 VITALS — BP 158/72 | HR 50 | Ht 71.0 in | Wt 133.2 lb

## 2022-08-09 DIAGNOSIS — E871 Hypo-osmolality and hyponatremia: Secondary | ICD-10-CM | POA: Diagnosis not present

## 2022-08-09 DIAGNOSIS — E1065 Type 1 diabetes mellitus with hyperglycemia: Secondary | ICD-10-CM | POA: Diagnosis not present

## 2022-08-09 DIAGNOSIS — I10 Essential (primary) hypertension: Secondary | ICD-10-CM | POA: Diagnosis not present

## 2022-08-09 LAB — POCT GLYCOSYLATED HEMOGLOBIN (HGB A1C): Hemoglobin A1C: 8.2 % — AB (ref 4.0–5.6)

## 2022-08-09 LAB — COMPREHENSIVE METABOLIC PANEL
ALT: 8 U/L (ref 0–53)
AST: 17 U/L (ref 0–37)
Albumin: 3.7 g/dL (ref 3.5–5.2)
Alkaline Phosphatase: 69 U/L (ref 39–117)
BUN: 8 mg/dL (ref 6–23)
CO2: 33 mEq/L — ABNORMAL HIGH (ref 19–32)
Calcium: 9.1 mg/dL (ref 8.4–10.5)
Chloride: 94 mEq/L — ABNORMAL LOW (ref 96–112)
Creatinine, Ser: 0.67 mg/dL (ref 0.40–1.50)
GFR: 99.74 mL/min (ref >60.00)
Glucose, Bld: 101 mg/dL — ABNORMAL HIGH (ref 70–99)
Potassium: 4.2 mEq/L (ref 3.5–5.1)
Sodium: 133 mEq/L — ABNORMAL LOW (ref 135–145)
Total Bilirubin: 0.3 mg/dL (ref 0.2–1.2)
Total Protein: 6.4 g/dL (ref 6.0–8.3)

## 2022-08-09 LAB — LIPID PANEL
Cholesterol: 148 mg/dL (ref 0–200)
HDL: 59.6 mg/dL (ref >39.00)
LDL Cholesterol: 79 mg/dL (ref 0–99)
NonHDL: 87.98
Total CHOL/HDL Ratio: 2
Triglycerides: 46 mg/dL (ref 0.0–149.0)
VLDL: 9.2 mg/dL (ref 0.0–40.0)

## 2022-08-09 NOTE — Patient Instructions (Addendum)
Lantus 30 in and 14 at nite  Take Novolog for all carbs and nite sandwich

## 2022-08-09 NOTE — Progress Notes (Signed)
Please call to let patient know that the lab results are better with improved sodium and no further action needed

## 2022-08-09 NOTE — Progress Notes (Signed)
Patient ID: Peter Rams., male   DOB: Aug 28, 1960, 62 y.o.   MRN: [id number]   Reason for Appointment: Endocrinology follow-up   History of Present Illness   Diagnosis: Type 1 diabetes mellitus, diagnosis 1991.  He has been on basal bolus insulin regimen for a few years His A1c previously was consistently around 7-7.5 He tends to have significant fluctuation in blood sugars at all times. He has difficulty keeping his postprandial blood sugars consistent with mealtime insulin coverage and estimating the amount of insulin needed for various kinds of foods since he does not count carbohydrates A1c has been higher since 2014  Recent history:   The insulin regimen is  Lantus 32--10 units at 7 AM and 7 PM; Novolog 3-5 acb 4-8 acl & acs    A1c is slightly better at 8.2, previously was at 9.3 %   Current diabetes management, problems identified and blood sugar patterns He finally was able to restart his libre sensor version 2 about 2 months ago Currently his blood sugars appear to be the highest overnight averaging over 200  Although he has had mildly increased blood sugars at bedtime he will still eat a full sandwich and crackers sometimes at 10:00 and this will sometimes cause his blood sugar to be over 300  High blood sugars tend to persist through the night Blood sugars are still overall high during the day with significant variability and occasional mealtime spikes His recent GMI is 8.3  May be benefiting from going back to CGM He says this morning his blood sugar was high but he only had 1 piece of bread and without any NovoLog his blood sugars started coming down slowly from taking his Lantus  [name] arbitrarily taking 3 to 5 units of NovoLog and may sometimes take it right after eating Also appears to have significant rebound sometimes with low normal readings  Current time in range is only 34% No recent weight change  Download of the freestyle libre sensor for the last 2  weeks shows the following  On an average blood sugars are above the target range at all times except around noon HIGHEST blood sugars are early morning around 6-8 AM but blood sugars averaging over 200 between 6 PM and 10 AM POSTPRANDIAL readings are variably higher at all different meals with no consistent pattern On an average blood sugars are not rising higher after meals compared to Premeal readings but Premeal readings are quite variable Generally overnight blood sugars are persistently high and within the target range only a couple of times Hypoglycemia is minimal except on 12/25 after breakfast  CGM use % of time 97  2-week average/GV   Time in range  34      %  % Time Above 180 40+25  % Time above 250   % Time Below 70 1     PRE-MEAL Fasting Lunch Dinner Bedtime Overall  Glucose range:       Averages: 211   232    POST-MEAL PC Breakfast PC Lunch PC Dinner  Glucose range:     Averages: 214 196  216    Previously:   PRE-MEAL Fasting Lunch Dinner Bedtime Overall  Glucose range: 60-182 83-282 108-223    Mean/median:        POST-MEAL PC Breakfast PC Lunch PC Dinner  Glucose range:   203-318  Mean/median:        Hypoglycemia awareness: Usually develops symptoms when blood glucose is less than  50, usually sweating.   Meals: Inconsistent schedule, Inconsistent quantity. Breakfast is at 6-7 am Supper 6-7 pm  Variable intake at breakfast, some oatmeal; 1/2 sandwich or peanut butter crackers at bedtime   Dietician visit: Most recent:, 4/09.   Wt Readings from Last 3 Encounters:  08/09/22 133 lb 3.2 oz (60.4 kg)  02/24/22 132 lb 3.2 oz (60 kg)  12/14/21 131 lb 12.8 oz (59.8 kg)    Lab Results  Component Value Date   HGBA1C 8.2 (A) 08/09/2022   HGBA1C 9.3 (A) 02/24/2022   HGBA1C 9.0 (A) 10/14/2021   Lab Results  Component Value Date   MICROALBUR 10.4 (H) 10/14/2021   LDLCALC 51 12/22/2021   CREATININE 0.86 05/04/2022    Other active problems: See review of  systems     Allergies as of 08/09/2022       Reactions   Heparin Other (See Comments)   Heparin (SRA positive)   Pravastatin Sodium Other (See Comments)   myalgia        Medication List        Accurate as of August 09, 2022  2:28 PM. If you have any questions, ask your nurse or doctor.          Accu-Chek Guide test strip Generic drug: glucose blood Use to check blood sugar 6 times a day before meals   Accu-Chek Guide w/Device Kit Use to check blood sugar daily   Accu-Chek Softclix Lancets lancets Use to check blood sugar 3 times a day before meals   acetaminophen 500 MG tablet Commonly known as: TYLENOL Take 1,000 mg by mouth every 6 (six) hours as needed for mild pain or headache.   ALPRAZolam 1 MG tablet Commonly known as: XANAX Take 1 mg by mouth 3 (three) times daily.   amLODipine 5 MG tablet Commonly known as: NORVASC Take 1 tablet (5 mg total) by mouth daily.   aspirin EC 81 MG tablet Take 1 tablet (81 mg total) by mouth daily. Swallow whole.   Droplet Pen Needles 31G X 8 MM Misc Generic drug: Insulin Pen Needle USE TO INJECT  INSULIN FIVE TIMES DAILY   fluconazole 150 MG tablet Commonly known as: DIFLUCAN Take 1 tablet (150 mg total) by mouth once a week.   FreeStyle Libre 14 Day Sensor Misc 1 Units by Does not apply route every 14 (fourteen) days.   gabapentin 300 MG capsule Commonly known as: NEURONTIN Take 1 capsule (300 mg total) by mouth 3 (three) times daily.   Lantus SoloStar 100 UNIT/ML Solostar Pen Generic drug: insulin glargine INJECT 30 UNITS SUBCUTANEOUSLY IN THE MORNING AND 32 UNITS IN THE EVENING. What changed: additional instructions   nicotine 21 mg/24hr patch Commonly known as: NICODERM CQ - dosed in mg/24 hours Place 1 patch (21 mg total) onto the skin daily.   NovoLOG FlexPen 100 UNIT/ML FlexPen Generic drug: insulin aspart Inject 4-6 Units into the skin 3 (three) times daily with meals.   omeprazole 20 MG  capsule Commonly known as: PRILOSEC Take 20 mg by mouth daily.   oxyCODONE-acetaminophen 10-325 MG tablet Commonly known as: PERCOCET Take 1 tablet by mouth 3 (three) times daily.   ramipril 10 MG capsule Commonly known as: ALTACE Take 1 capsule (10 mg total) by mouth daily.   rosuvastatin 40 MG tablet Commonly known as: CRESTOR Take 1 tablet (40 mg total) by mouth daily.   Urea Powd Dissolve 15 g in any liquid and take once daily   Xarelto 20 MG  Tabs tablet Generic drug: rivaroxaban TAKE ONE TABLET BY MOUTH ONCE DAILY WITH SUPPER.        Allergies:  Allergies  Allergen Reactions   Heparin Other (See Comments)    Heparin (SRA positive)   Pravastatin Sodium Other (See Comments)    myalgia    Past Medical History:  Diagnosis Date   Chicken pox    Claudication (HCC)    Diabetes 1.5, managed as type 1 ([place])    Hypertension    Low back pain    PAD (peripheral artery disease) ([place])    Tobacco abuse     Past Surgical History:  Procedure Laterality Date   AMPUTATION Right 09/18/2020   Procedure: RIGHT BELOW KNEE AMPUTATION;  Surgeon: Newt Minion, MD;  Location: [facility];  Service: Orthopedics;  Laterality: Right;   AORTA - BILATERAL FEMORAL ARTERY BYPASS GRAFT Bilateral 09/11/2020   Procedure: AORTA BIFEMORAL BYPASS GRAFT;  Surgeon: Rosetta Posner, MD;  Location: [facility];  Service: Vascular;  Laterality: Bilateral;   CATARACT EXTRACTION Bilateral    FEMORAL-POPLITEAL BYPASS GRAFT Right 09/11/2020   Procedure: RIGHT FEMORAL-POPLITEAL ARTERY BYPASS GRAFTING;  Surgeon: Rosetta Posner, MD;  Location: [facility];  Service: Vascular;  Laterality: Right;   INTRAOCULAR LENS INSERTION Bilateral    RETINAL DETACHMENT SURGERY Bilateral     Family History  Problem Relation Age of Onset   Diabetes Mother    Atrial fibrillation Mother    Heart attack Father    Stroke Father    Arthritis Father    Heart disease Father    Diabetes Maternal Uncle    Diabetes Maternal Grandmother     Heart attack Maternal Grandmother    Heart disease Maternal Grandmother    Esophageal cancer Maternal Grandfather    Heart attack Sister        at age 23    Social History:  reports that he has been smoking cigarettes. He has a 31.50 pack-year smoking history. He has never used smokeless tobacco. He reports that he does not currently use drugs. He reports that he does not drink alcohol.  Review of Systems   HYPERTENSION: Blood pressure is controlled although variable  Is on 10 mg ramipril and 5 mg amlodipine followed by cardiologist   Does check blood pressure at home  BP Readings from Last 3 Encounters:  08/09/22 (!) 158/72  02/24/22 128/64  12/14/21 (!) 90/50     Lipids: On Crestor 40 mg from cardiologist     Lab Results  Component Value Date   CHOL 115 12/22/2021   HDL 54 12/22/2021   LDLCALC 51 12/22/2021   TRIG 36 12/22/2021   CHOLHDL 2.1 12/22/2021    HYPONATREMIA: Long-standing and asymptomatic.  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   Demeclocycline was prescribed previously but  not covered by insurance.  Cortrosyn stimulation test was also normal   Urinary sodium in 6/21 was <20 and U Osm 509  [name] asymptomatic with no nausea or decreased appetite even when his sodium was 125 in 9/21  [name] still drinking 2-3 Gatorade bottles daily He was prescribed urea and he could not find it at [facility] sodium as follows:   Lab Results  Component Value Date   CREATININE 0.86 05/04/2022   BUN 9 05/04/2022   NA 129 (L) 05/04/2022   K 4.7 05/04/2022  CL 94 (L) 05/04/2022   CO2 31 05/04/2022    Lung cancer screening: He had a CT scan done through the pulmonologist and does not have any significant lesions but 89-monthexam is being scheduled  Chronic back pain. 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     Assesment/PLAN:  Diabetes type 1, long-standing on basal bolus insulin regimen   See history of present illness for detailed discussion of current blood sugar patterns, problems identified and current management  His A1c is 8.2  His blood sugars are still poorly controlled Appears to be getting inadequate basal insulin especially overnight However as seen from today's CGM pattern his Lantus appears to be active fairly quickly after his morning injection  [name] mostly not getting enough coverage for his meals especially late at night when he may eat a sandwich  His day-to-day management, diet and managing mealtime insulin was discussed in detail  Recommendations: Novolog 6-7 at larger meal To take NovoLog consistently for all carbohydrate intake Avoid excessive snacks late in the evening, reassured him that his Lantus will not drop his sugars excessively and he will be also alerted with the CGM  Lantus 30 in am and 14 at nite  Prescription for freestyle libre reader and sensor to be sent through [employer] to provide his supplies To call if his blood sugars are consistently high or low    2. . Hypertension: Blood pressure is relatively higher today, usually has some whitecoat syndrome  He does need to start monitoring at home also   3.  SIADH causing hyponatremia: Needs follow-up sodium checked today Consider urea  from mail order company if needed   4.  He will have [employer] send [employer] form for getting the diabetic shoes    Patient Instructions  Lantus 30 in and 14 at nite  Take Novolog for all carbs and nite sandwich   AElayne Snare1/09/2022, 2:28 PM

## 2022-08-10 ENCOUNTER — Encounter: Payer: Self-pay | Admitting: Endocrinology

## 2022-08-17 ENCOUNTER — Other Ambulatory Visit: Payer: Self-pay | Admitting: Endocrinology

## 2022-08-17 DIAGNOSIS — E1065 Type 1 diabetes mellitus with hyperglycemia: Secondary | ICD-10-CM

## 2022-10-06 ENCOUNTER — Encounter: Payer: Self-pay | Admitting: Radiology

## 2022-11-21 ENCOUNTER — Encounter: Payer: Self-pay | Admitting: Endocrinology

## 2022-11-21 ENCOUNTER — Ambulatory Visit (INDEPENDENT_AMBULATORY_CARE_PROVIDER_SITE_OTHER): Payer: Medicare HMO | Admitting: Endocrinology

## 2022-11-21 VITALS — BP 140/84 | HR 69 | Ht 71.0 in | Wt 129.6 lb

## 2022-11-21 DIAGNOSIS — I10 Essential (primary) hypertension: Secondary | ICD-10-CM

## 2022-11-21 DIAGNOSIS — E1065 Type 1 diabetes mellitus with hyperglycemia: Secondary | ICD-10-CM | POA: Diagnosis not present

## 2022-11-21 DIAGNOSIS — E871 Hypo-osmolality and hyponatremia: Secondary | ICD-10-CM

## 2022-11-21 LAB — POCT GLYCOSYLATED HEMOGLOBIN (HGB A1C): Hemoglobin A1C: 8.3 % — AB (ref 4.0–5.6)

## 2022-11-21 NOTE — Progress Notes (Signed)
Patient ID: Peter Newness., male   DOB: 1961/01/24, 62 y.o.   MRN: [id number]   Reason for Appointment: Endocrinology follow-up   History of Present Illness   Diagnosis: Type 1 diabetes mellitus, diagnosis 1991.  He has been on basal bolus insulin regimen for a few years His A1c previously was consistently around 7-7.5 He tends to have significant fluctuation in blood sugars at all times. He has difficulty keeping his postprandial blood sugars consistent with mealtime insulin coverage and estimating the amount of insulin needed for various kinds of foods since he does not count carbohydrates A1c has been higher since 2014  Recent history:   The insulin regimen is  Lantus 30--10 units at 7 AM and 7 PM; Novolog 3-5 acb 4-8 acl & acs    A1c is 8.3, was 8.2  Current diabetes management, problems identified and blood sugar patterns He appears to have significantly high readings throughout the day and night for several days until about 5 days ago and then they have been lower He says that he switched out his Lantus and NovoLog pens and blood sugars started coming down On 4/12 his blood sugar was low during the night without any change in his routine or insulin  However he still tends to have periodically high readings after meals Yesterday blood sugar went up significantly high because of eating 3 donuts He now says that he takes his Lantus at 7 AM but he waits 15 to 30 minutes after breakfast to take his NovoLog Generally postprandial readings are not consistently high but may occasionally have higher readings after dinner Current time in range is only 32%, similar to previous Has lost 4 pounds  Download of the freestyle libre sensor 2 for the last 2 weeks shows the following  Overnight blood sugars are highly variable with significantly high readings for at least 4 nights but relatively low readings overnight the last 4 nights Hypoglycemia occurred on 3/12 as above, lasted  several hours Premeal blood sugars appear to be mostly significantly high the rest of the day and averaging over 200 at lunch and dinner Postprandial readings are on an average not significantly higher but will sporadically go up significantly after lunch or dinner Hypoglycemia occurred only on 3/12 and not during the day   CGM use % of time   2-week average/GV 205  Time in range       32%  % Time Above 180 40  % Time above 250 25  % Time Below 70 3     PRE-MEAL Fasting Lunch Dinner Bedtime Overall  Glucose range:       Averages: 188       POST-MEAL PC Breakfast PC Lunch PC Dinner  Glucose range:     Averages: 168 215 225   Previously   CGM use % of time 97  2-week average/GV   Time in range  34      %  % Time Above 180 40+25  % Time above 250   % Time Below 70 1     PRE-MEAL Fasting Lunch Dinner Bedtime Overall  Glucose range:       Averages: 211   232    POST-MEAL PC Breakfast PC Lunch PC Dinner  Glucose range:     Averages: 214 196  216    Hypoglycemia awareness: Usually develops symptoms when blood glucose is less than 50, usually sweating.   Meals: Inconsistent schedule, Inconsistent quantity. Breakfast is at 6-7 am  Supper 6-7 pm  Variable intake at breakfast, some oatmeal; 1/2 sandwich or peanut butter crackers at bedtime   Dietician visit: Most recent:, 4/09.   Wt Readings from Last 3 Encounters:  11/21/22 129 lb 9.6 oz (58.8 kg)  08/09/22 133 lb 3.2 oz (60.4 kg)  02/24/22 132 lb 3.2 oz (60 kg)    Lab Results  Component Value Date   HGBA1C 8.3 (A) 11/21/2022   HGBA1C 8.2 (A) 08/09/2022   HGBA1C 9.3 (A) 02/24/2022   Lab Results  Component Value Date   MICROALBUR 10.4 (H) 10/14/2021   LDLCALC 79 08/09/2022   CREATININE 0.67 08/09/2022    Other active problems: See review of systems     Allergies as of 11/21/2022       Reactions   Heparin Other (See Comments)   Heparin (SRA positive)   Pravastatin Sodium Other (See Comments)   myalgia         Medication List        Accurate as of November 21, 2022  2:25 PM. If you have any questions, ask your nurse or doctor.          Accu-Chek Guide test strip Generic drug: glucose blood Use to check blood sugar 6 times a day before meals   Accu-Chek Guide w/Device Kit Use to check blood sugar daily   Accu-Chek Softclix Lancets lancets Use to check blood sugar 3 times a day before meals   acetaminophen 500 MG tablet Commonly known as: TYLENOL Take 1,000 mg by mouth every 6 (six) hours as needed for mild pain or headache.   ALPRAZolam 1 MG tablet Commonly known as: XANAX Take 1 mg by mouth 3 (three) times daily.   amLODipine 5 MG tablet Commonly known as: NORVASC Take 1 tablet (5 mg total) by mouth daily.   aspirin EC 81 MG tablet Take 1 tablet (81 mg total) by mouth daily. Swallow whole.   Droplet Pen Needles 31G X 8 MM Misc Generic drug: Insulin Pen Needle USE TO INJECT  INSULIN FIVE TIMES DAILY   fluconazole 150 MG tablet Commonly known as: DIFLUCAN Take 1 tablet (150 mg total) by mouth once a week.   FreeStyle Libre 14 Day Sensor Misc 1 Units by Does not apply route every 14 (fourteen) days.   gabapentin 300 MG capsule Commonly known as: NEURONTIN Take 1 capsule (300 mg total) by mouth 3 (three) times daily.   Lantus SoloStar 100 UNIT/ML Solostar Pen Generic drug: insulin glargine INJECT 30 UNITS INTO THE SKIN IN THE MORNING AN 14 UNITS IN THE EVENING.   nicotine 21 mg/24hr patch Commonly known as: NICODERM CQ - dosed in mg/24 hours Place 1 patch (21 mg total) onto the skin daily.   NovoLOG FlexPen 100 UNIT/ML FlexPen Generic drug: insulin aspart Inject 4-6 Units into the skin 3 (three) times daily with meals.   omeprazole 20 MG capsule Commonly known as: PRILOSEC Take 20 mg by mouth daily.   oxyCODONE-acetaminophen 10-325 MG tablet Commonly known as: PERCOCET Take 1 tablet by mouth 3 (three) times daily.   ramipril 10 MG capsule Commonly  known as: ALTACE Take 1 capsule (10 mg total) by mouth daily.   rosuvastatin 40 MG tablet Commonly known as: CRESTOR Take 1 tablet (40 mg total) by mouth daily.   Urea Powd Dissolve 15 g in any liquid and take once daily   Xarelto 20 MG Tabs tablet Generic drug: rivaroxaban TAKE ONE TABLET BY MOUTH ONCE DAILY WITH SUPPER.  Allergies:  Allergies  Allergen Reactions   Heparin Other (See Comments)    Heparin (SRA positive)   Pravastatin Sodium Other (See Comments)    myalgia    Past Medical History:  Diagnosis Date   Chicken pox    Claudication    Diabetes 1.5, managed as type 1    Hypertension    Low back pain    PAD (peripheral artery disease)    Tobacco abuse     Past Surgical History:  Procedure Laterality Date   AMPUTATION Right 09/18/2020   Procedure: RIGHT BELOW KNEE AMPUTATION;  Surgeon: Nadara Mustard, MD;  Location: [facility] OR;  Service: Orthopedics;  Laterality: Right;   AORTA - BILATERAL FEMORAL ARTERY BYPASS GRAFT Bilateral 09/11/2020   Procedure: AORTA BIFEMORAL BYPASS GRAFT;  Surgeon: Larina Earthly, MD;  Location: [facility] OR;  Service: Vascular;  Laterality: Bilateral;   CATARACT EXTRACTION Bilateral    FEMORAL-POPLITEAL BYPASS GRAFT Right 09/11/2020   Procedure: RIGHT FEMORAL-POPLITEAL ARTERY BYPASS GRAFTING;  Surgeon: Larina Earthly, MD;  Location: [facility] OR;  Service: Vascular;  Laterality: Right;   INTRAOCULAR LENS INSERTION Bilateral    RETINAL DETACHMENT SURGERY Bilateral     Family History  Problem Relation Age of Onset   Diabetes Mother    Atrial fibrillation Mother    Heart attack Father    Stroke Father    Arthritis Father    Heart disease Father    Diabetes Maternal Uncle    Diabetes Maternal Grandmother    Heart attack Maternal Grandmother    Heart disease Maternal Grandmother    Esophageal cancer Maternal Grandfather    Heart attack Sister        at age 35    Social History:  reports that he has been smoking cigarettes. He has a 31.50  pack-year smoking history. He has never used smokeless tobacco. He reports that he does not currently use drugs. He reports that he does not drink alcohol.  Review of Systems   HYPERTENSION: Blood pressure is controlled although variable  Is on 10 mg ramipril and 5 mg amlodipine followed by cardiologist   Does check blood pressure at home and usually not high  BP Readings from Last 3 Encounters:  11/21/22 (!) 140/84  08/09/22 (!) 158/72  02/24/22 128/64     Lipids: On Crestor 40 mg from cardiologist     Lab Results  Component Value Date   CHOL 148 08/09/2022   HDL 59.60 08/09/2022   LDLCALC 79 08/09/2022   TRIG 46.0 08/09/2022   CHOLHDL 2 08/09/2022    HYPONATREMIA: Long-standing and asymptomatic.  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   Demeclocycline was prescribed previously but  not covered by insurance.  Cortrosyn stimulation test was also normal   Urinary sodium in 6/21 was <20 and U Osm 509  [name] asymptomatic with no nausea or decreased appetite even when his sodium was 125 in 9/21  He was prescribed urea and he could not find it at [facility] He was told to cut back on Gatorade on the last visit  Last sodium as follows:   Lab Results  Component Value Date   CREATININE 0.67 08/09/2022   BUN 8 08/09/2022   NA 133 (L) 08/09/2022   K 4.2 08/09/2022   CL 94 (L) 08/09/2022   CO2 33 (H) 08/09/2022    Lung  cancer screening: He had a CT scan done through the pulmonologist  Chronic back pain. 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   Diabetic Foot Exam - Simple   Simple Foot Form Diabetic Foot exam was performed with the following findings: Yes   Visual Inspection See comments: Yes Sensation Testing See comments: Yes Pulse Check See comments: Yes Comments    No ankle edema   Assesment/PLAN:  Diabetes type 1, long-standing on basal bolus insulin regimen   See history of present illness for detailed discussion of current blood sugar patterns, problems identified and current management  His A1c is 8.3  His blood sugars are generally poorly controlled He has variability in his blood sugars and until recently appeared to have some hyperglycemia this month related to his insulin pen going back, likely the Lantus Blood sugars were reviewed from his CGM download Timing of insulin adjustment based on meal size and carbohydrate content  Compared to the last visit he appears to be needing less basal insulin overnight recently in the last 3 to 4 days However likely needs more insulin during the day  His day-to-day management of insulin and monitoring, targets of the blood sugars were discussed  Recommendations: Novolog must be taken before starting to eat in the morning consistently To take NovoLog consistently for all carbohydrate intake Avoid excessive snacks late in the evening, reassured him that his Lantus will not drop his sugars excessively and he will be also alerted with the CGM  Lantus 32 in am and 8 units in the evening He will call if he starts getting hypoglycemia   2. . Hypertension: Blood pressure is high normal today but better at home    3.  SIADH causing hyponatremia: Needs follow-up sodium checked today Reminded him to limit excessive fluid  intake   4.  He will have urine microalbumin checked if able to  He will also follow-up with his orthopedic surgeon for toenail trimming and reminded him to check his left foot daily    Patient Instructions  LANTUS 32 IN AM AND 8 AT NITE  See Dr Lajoyce Corners for foot care   Peter Mendez 11/21/2022, 2:25 PM

## 2022-11-21 NOTE — Patient Instructions (Addendum)
LANTUS 32 IN AM AND 8 AT NITE  See Dr Lajoyce Corners for foot care

## 2022-11-23 ENCOUNTER — Encounter: Payer: Self-pay | Admitting: Endocrinology

## 2022-11-29 ENCOUNTER — Other Ambulatory Visit: Payer: Self-pay | Admitting: Endocrinology

## 2022-11-29 DIAGNOSIS — E1065 Type 1 diabetes mellitus with hyperglycemia: Secondary | ICD-10-CM

## 2023-02-28 ENCOUNTER — Ambulatory Visit (INDEPENDENT_AMBULATORY_CARE_PROVIDER_SITE_OTHER): Payer: Medicare HMO | Admitting: Endocrinology

## 2023-02-28 ENCOUNTER — Encounter: Payer: Self-pay | Admitting: Endocrinology

## 2023-02-28 VITALS — BP 130/70 | HR 75 | Ht 71.0 in | Wt 124.2 lb

## 2023-02-28 DIAGNOSIS — E871 Hypo-osmolality and hyponatremia: Secondary | ICD-10-CM | POA: Diagnosis not present

## 2023-02-28 DIAGNOSIS — R634 Abnormal weight loss: Secondary | ICD-10-CM | POA: Diagnosis not present

## 2023-02-28 DIAGNOSIS — E1065 Type 1 diabetes mellitus with hyperglycemia: Secondary | ICD-10-CM | POA: Diagnosis not present

## 2023-02-28 LAB — POCT GLYCOSYLATED HEMOGLOBIN (HGB A1C): Hemoglobin A1C: 8.3 % — AB (ref 4.0–5.6)

## 2023-02-28 LAB — BASIC METABOLIC PANEL
BUN: 6 mg/dL (ref 6–23)
CO2: 31 mEq/L (ref 19–32)
Calcium: 9.1 mg/dL (ref 8.4–10.5)
Chloride: 92 mEq/L — ABNORMAL LOW (ref 96–112)
Creatinine, Ser: 0.69 mg/dL (ref 0.40–1.50)
GFR: 98.47 mL/min (ref >60.00)
Glucose, Bld: 215 mg/dL — ABNORMAL HIGH (ref 70–99)
Potassium: 4.4 mEq/L (ref 3.5–5.1)
Sodium: 129 mEq/L — ABNORMAL LOW (ref 135–145)

## 2023-02-28 LAB — T4, FREE: Free T4: 1.07 ng/dL (ref 0.60–1.60)

## 2023-02-28 LAB — MICROALBUMIN / CREATININE URINE RATIO
Creatinine,U: 31.1 mg/dL
Microalb Creat Ratio: 7.3 mg/g (ref 0.0–30.0)
Microalb, Ur: 2.3 mg/dL — ABNORMAL HIGH (ref 0.0–1.9)

## 2023-02-28 LAB — TSH: TSH: 1.53 u[IU]/mL (ref 0.35–5.50)

## 2023-02-28 NOTE — Patient Instructions (Addendum)
Lantus 30 units in am

## 2023-02-28 NOTE — Progress Notes (Signed)
Patient ID: Peter Mendez., male   DOB: Aug 25, 1960, 62 y.o.   MRN: [id number]   Reason for Appointment: Endocrinology follow-up   History of Present Illness   Diagnosis: Type 1 diabetes mellitus, diagnosis 1991.  He has been on basal bolus insulin regimen for a few years His A1c previously was consistently around 7-7.5 He tends to have significant fluctuation in blood sugars at all times. He has difficulty keeping his postprandial blood sugars consistent with mealtime insulin coverage and estimating the amount of insulin needed for various kinds of foods since he does not count carbohydrates A1c has been higher since 2014  Recent history:   The insulin regimen is  Lantus 32 at 7 AM --10 units: 7 PM; Novolog 4-6 acb 4-8 acl & acs 4-8   A1c is 8.3  Sensor GMI is 7.5  Current diabetes management, problems identified and blood sugar patterns He recently has better blood sugars compared to his last visit This is only with mild increase in his basal insulin in the morning by 2 units on the last visit May be more insulin sensitive now but also may be little rebounding less from tendency to low sugars overnight previously However blood sugars are quite variable and only in the last couple of days his overnight blood sugars are relatively better and also has sporadic episodes of hyperglycemia at different times but less so in the last week He tends to have higher readings after breakfast at times and this may be related to eating cereal even though this is special K and he thinks he takes 6 units for this He thinks he gets symptomatic with low blood sugars even there around 70 and he felt this way at 6 PM yesterday, treated with food not simple sugars Overall he thinks he is now reducing his portions Also likely avoiding sweets like donuts Current time in range is only 54 percent and improved Has lost 5 pounds in the last 3 months 4 pounds  Breakfast at 6-7 and lunch 12  pm  Download of the freestyle libre sensor 2 for the last 2 weeks shows the following  Overnight blood sugars are somewhat variable with generally better readings in the last 3 overnight but usually averaging about 170-180, sometimes will have some hyperglycemic episodes during the night also During the day Premeal blood sugars are relatively stable in the upper end of the target range Postprandial readings after breakfast are periodically going up significantly but usually under 250 After lunch blood sugars are also on an average fairly level compared to Premeal readings Also at dinnertime blood sugars are generally not spiking but overall has the highest readings of the day No hypoglycemia documented except once around 1 PM  CGM use % of time   2-week average/GV 176/24  Time in range   54     %  % Time Above 180 42  % Time above 250 4  % Time Below 70 0     PRE-MEAL Fasting Lunch Dinner Bedtime Overall  Glucose range:       Averages: 172       POST-MEAL PC Breakfast PC Lunch PC Dinner  Glucose range:     Averages: 191  175 177   Previously:   CGM use % of time   2-week average/GV 205  Time in range       32%  % Time Above 180 40  % Time above 250 25  % Time Below 70  3     PRE-MEAL Fasting Lunch Dinner Bedtime Overall  Glucose range:       Averages: 188       POST-MEAL PC Breakfast PC Lunch PC Dinner  Glucose range:     Averages: 168 215 225     Hypoglycemia awareness: Usually develops symptoms when blood glucose is less than 50, usually sweating.   Meals: Inconsistent schedule, Inconsistent quantity. Breakfast is at 6-7 am Supper 6-7 pm  Variable intake at breakfast, some oatmeal; 1/2 sandwich or peanut butter crackers at bedtime   Dietician visit: Most recent:, 4/09.   Wt Readings from Last 3 Encounters:  02/28/23 124 lb 3.2 oz (56.3 kg)  11/21/22 129 lb 9.6 oz (58.8 kg)  08/09/22 133 lb 3.2 oz (60.4 kg)    Lab Results  Component Value Date   HGBA1C  8.3 (A) 11/21/2022   HGBA1C 8.2 (A) 08/09/2022   HGBA1C 9.3 (A) 02/24/2022   Lab Results  Component Value Date   MICROALBUR 10.4 (H) 10/14/2021   LDLCALC 79 08/09/2022   CREATININE 0.67 08/09/2022    Other active problems: See review of systems     Allergies as of 02/28/2023       Reactions   Heparin Other (See Comments)   Heparin (SRA positive)   Pravastatin Sodium Other (See Comments)   myalgia        Medication List        Accurate as of February 28, 2023 11:01 AM. If you have any questions, ask your nurse or doctor.          Accu-Chek Guide test strip Generic drug: glucose blood Use to check blood sugar 6 times a day before meals   Accu-Chek Guide w/Device Kit Use to check blood sugar daily   Accu-Chek Softclix Lancets lancets Use to check blood sugar 3 times a day before meals   acetaminophen 500 MG tablet Commonly known as: TYLENOL Take 1,000 mg by mouth every 6 (six) hours as needed for mild pain or headache.   ALPRAZolam 1 MG tablet Commonly known as: XANAX Take 1 mg by mouth 3 (three) times daily.   amLODipine 5 MG tablet Commonly known as: NORVASC Take 1 tablet (5 mg total) by mouth daily.   aspirin EC 81 MG tablet Take 1 tablet (81 mg total) by mouth daily. Swallow whole.   Droplet Pen Needles 31G X 8 MM Misc Generic drug: Insulin Pen Needle USE TO INJECT  INSULIN FIVE TIMES DAILY   fluconazole 150 MG tablet Commonly known as: DIFLUCAN Take 1 tablet (150 mg total) by mouth once a week.   FreeStyle Libre 14 Day Sensor Misc 1 Units by Does not apply route every 14 (fourteen) days.   gabapentin 300 MG capsule Commonly known as: NEURONTIN Take 1 capsule (300 mg total) by mouth 3 (three) times daily.   Lantus SoloStar 100 UNIT/ML Solostar Pen Generic drug: insulin glargine INJECT 30 UNITS INTO THE SKIN IN THE MORNING AN 14 UNITS IN THE EVENING. What changed: See the new instructions.   nicotine 21 mg/24hr patch Commonly known as:  NICODERM CQ - dosed in mg/24 hours Place 1 patch (21 mg total) onto the skin daily.   NovoLOG FlexPen 100 UNIT/ML FlexPen Generic drug: insulin aspart INJECT 4-6 UNITS INTO THE SKIN3 TIMES DAILY WITH MEALS.   omeprazole 20 MG capsule Commonly known as: PRILOSEC Take 20 mg by mouth daily.   oxyCODONE-acetaminophen 10-325 MG tablet Commonly known as: PERCOCET Take 1 tablet by  mouth 3 (three) times daily.   ramipril 10 MG capsule Commonly known as: ALTACE Take 1 capsule (10 mg total) by mouth daily.   rosuvastatin 40 MG tablet Commonly known as: CRESTOR Take 1 tablet (40 mg total) by mouth daily.   Urea Powd Dissolve 15 g in any liquid and take once daily   Xarelto 20 MG Tabs tablet Generic drug: rivaroxaban TAKE ONE TABLET BY MOUTH ONCE DAILY WITH SUPPER.        Allergies:  Allergies  Allergen Reactions   Heparin Other (See Comments)    Heparin (SRA positive)   Pravastatin Sodium Other (See Comments)    myalgia    Past Medical History:  Diagnosis Date   Chicken pox    Claudication (HCC)    Diabetes 1.5, managed as type 1 (HCC)    Hypertension    Low back pain    PAD (peripheral artery disease) (HCC)    Tobacco abuse     Past Surgical History:  Procedure Laterality Date   AMPUTATION Right 09/18/2020   Procedure: RIGHT BELOW KNEE AMPUTATION;  Surgeon: Nadara Mustard, MD;  Location: [facility] OR;  Service: Orthopedics;  Laterality: Right;   AORTA - BILATERAL FEMORAL ARTERY BYPASS GRAFT Bilateral 09/11/2020   Procedure: AORTA BIFEMORAL BYPASS GRAFT;  Surgeon: Larina Earthly, MD;  Location: [facility] OR;  Service: Vascular;  Laterality: Bilateral;   CATARACT EXTRACTION Bilateral    FEMORAL-POPLITEAL BYPASS GRAFT Right 09/11/2020   Procedure: RIGHT FEMORAL-POPLITEAL ARTERY BYPASS GRAFTING;  Surgeon: Larina Earthly, MD;  Location: [facility] OR;  Service: Vascular;  Laterality: Right;   INTRAOCULAR LENS INSERTION Bilateral    RETINAL DETACHMENT SURGERY Bilateral     Family History   Problem Relation Age of Onset   Diabetes Mother    Atrial fibrillation Mother    Heart attack Father    Stroke Father    Arthritis Father    Heart disease Father    Diabetes Maternal Uncle    Diabetes Maternal Grandmother    Heart attack Maternal Grandmother    Heart disease Maternal Grandmother    Esophageal cancer Maternal Grandfather    Heart attack Sister        at age 110    Social History:  reports that he has been smoking cigarettes. He has a 31.5 pack-year smoking history. He has never used smokeless tobacco. He reports that he does not currently use drugs. He reports that he does not drink alcohol.  Review of Systems   HYPERTENSION: Blood pressure is controlled although variable  Is on 10 mg ramipril and 5 mg amlodipine followed by cardiologist   Does check blood pressure at home periodically  BP Readings from Last 3 Encounters:  02/28/23 130/70  11/21/22 (!) 140/84  08/09/22 (!) 158/72     Lipids: On Crestor 40 mg from cardiologist     Lab Results  Component Value Date   CHOL 148 08/09/2022   HDL 59.60 08/09/2022   LDLCALC 79 08/09/2022   TRIG 46.0 08/09/2022   CHOLHDL 2 08/09/2022    HYPONATREMIA: Long-standing and asymptomatic.  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   Demeclocycline was prescribed previously but  not covered by insurance.  Cortrosyn stimulation test was also normal   Urinary sodium in 6/21 was <20 and U Osm 509  [name] asymptomatic with no nausea or decreased appetite even when his  sodium was 125 in 9/21  He was prescribed urea and he could not find it at [facility] He has been advised lower fluid intake  Last sodium as follows:   Lab Results  Component Value Date   CREATININE 0.67 08/09/2022   BUN 8 08/09/2022   NA 133 (L) 08/09/2022   K 4.2 08/09/2022   CL 94 (L) 08/09/2022   CO2 33 (H)  08/09/2022    Lung cancer screening: Has not done any follow-up studies recently  Chronic back pain: Is taking Percocet   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   No ankle edema   Assesment/PLAN:  Diabetes type 1, long-standing on basal bolus insulin regimen   See history of present illness for detailed discussion of current blood sugar patterns, problems identified and current management  His A1c is 8.3, unchanged  His blood sugars are somewhat better more recently with GMI 7.5 Although he has still some variability in his blood sugars he does not have any consistent patterns of postprandial hyperglycemia except sometimes at breakfast No significant hypoglycemia also Discussed coverage of various meals with adjustment of NovoLog Also need to add extra for high readings Premeal  Recently appears to be having low readings before dinner  His day-to-day management of basal and bolus insulin and CGM results, targets of the blood sugars were discussed  Recommendations: Novolog can be increased to 7 or 8 units if eating cereal in the morning Add 1 to 2 units more for higher blood sugars Temporarily reduce Lantus to 30 units in the morning and may need to biopsy if his sugars are higher at suppertime Discussed appropriate treatment of hypoglycemia   2. . Hypertension: Blood pressure is normal today   3.  SIADH causing hyponatremia: Needs follow-up sodium checked today Discussed with him to limit excessive fluid intake   4.  He will have urine microalbumin checked since  this is overdue  5.  Progressive weight loss:    Patient Instructions  Lantus 30 units in am   Reather Littler 02/28/2023, 11:01 AM

## 2023-03-02 ENCOUNTER — Other Ambulatory Visit: Payer: Self-pay | Admitting: Cardiovascular Disease

## 2023-03-02 ENCOUNTER — Other Ambulatory Visit: Payer: Self-pay | Admitting: Endocrinology

## 2023-03-02 DIAGNOSIS — E1065 Type 1 diabetes mellitus with hyperglycemia: Secondary | ICD-10-CM

## 2023-03-06 NOTE — Progress Notes (Signed)
Please call to let patient know that the Sodium level is low at 129, needs to cut back on overall fluid intake otherwise all labs are okay

## 2023-03-07 ENCOUNTER — Encounter: Payer: Self-pay | Admitting: Endocrinology

## 2023-03-20 ENCOUNTER — Encounter: Payer: Self-pay | Admitting: Orthopedic Surgery

## 2023-03-20 ENCOUNTER — Ambulatory Visit (INDEPENDENT_AMBULATORY_CARE_PROVIDER_SITE_OTHER): Payer: Medicare HMO | Admitting: Orthopedic Surgery

## 2023-03-20 DIAGNOSIS — B351 Tinea unguium: Secondary | ICD-10-CM

## 2023-03-20 DIAGNOSIS — Z89511 Acquired absence of right leg below knee: Secondary | ICD-10-CM

## 2023-03-20 NOTE — Progress Notes (Signed)
Office Visit Note   Patient: Peter Mendez.           Date of Birth: 07-Nov-1960           MRN: [id number] Visit Date: 03/20/2023              Requested by: Leilani Able, MD [address] [place],  [place] [address] PCP: Leilani Able, MD  Chief Complaint  Patient presents with   Right Leg - Follow-up    09/2020 hx right BKA       HPI: Patient is a 62 year old gentleman who status post right transtibial amputation.  Patient has had significant decreased volume of the residual limb and has worn through his liner.  Patient also has painful thickened discolored onychomycotic nails left foot.  Assessment & Plan: Visit Diagnoses:  1. History of right below knee amputation (HCC)   2. Onychomycosis     Plan: Patient is provided prescription for [employer] for new liner and supplies.  Nails were trimmed x 5.  Follow-Up Instructions: Return if symptoms worsen or fail to improve.   Ortho Exam  Patient is alert, oriented, no adenopathy, well-dressed, normal affect, normal respiratory effort. Examination of the right leg there were no open ulcers there is decreased residual volume.  Examination of the left foot there are no open ulcers there is thickened discolored onychomycotic nails and they were trimmed x 5 without complications.  Patient is an existing right transtibial  amputee.  Patient's current comorbidities are not expected to impact the ability to function with the prescribed prosthesis. Patient verbally communicates a strong desire to use a prosthesis. Patient currently requires mobility aids to ambulate without a prosthesis.  Expects not to use mobility aids with a new prosthesis.  Patient is a K3 level ambulator that spends a lot of time walking around on uneven terrain over obstacles, up and down stairs, and ambulates with a variable cadence.    Imaging: No results found. No images are attached to the encounter.  Labs: Lab Results  Component Value Date   HGBA1C 8.3  (A) 02/28/2023   HGBA1C 8.3 (A) 11/21/2022   HGBA1C 8.2 (A) 08/09/2022   REPTSTATUS 09/13/2020 FINAL 09/08/2020   CULT  09/08/2020    NO GROWTH 5 DAYS Performed at [facility], [address] [place], [place] [address]      Lab Results  Component Value Date   ALBUMIN 3.7 08/09/2022   ALBUMIN 3.9 12/22/2021   ALBUMIN 4.0 10/14/2021    Lab Results  Component Value Date   MG 1.9 09/24/2020   MG 2.1 09/23/2020   MG 1.9 09/22/2020   No results found for: "VD25OH"  No results found for: "PREALBUMIN"    Latest Ref Rng & Units 09/25/2020    6:23 AM 09/24/2020    1:08 AM 09/23/2020    1:16 AM  CBC EXTENDED  WBC 4.0 - 10.5 K/uL 8.9  9.9  10.7   RBC 4.22 - 5.81 MIL/uL 2.71  2.51  2.72   Hemoglobin 13.0 - 17.0 g/dL 8.1  7.6  8.3   HCT 46.9 - 52.0 % 24.5  22.1  23.5   Platelets 150 - 400 K/uL 59  57  48      There is no height or weight on file to calculate BMI.  Orders:  No orders of the defined types were placed in this encounter.  No orders of the defined types were placed in this encounter.  Procedures: No procedures performed  Clinical Data: No additional findings.  ROS:  All other systems negative, except as noted in the HPI. Review of Systems  Objective: Vital Signs: There were no vitals taken for this visit.  Specialty Comments:  No specialty comments available.  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) 05/23/2016   Chronic back pain 12/29/2015   Generalized anxiety disorder 12/29/2015   PVD (peripheral vascular disease) (HCC) 05/09/2014   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   Hyponatremia 02/25/2013   Essential hypertension 02/25/2013   Pure hypercholesterolemia 02/25/2013   Past Medical History:  Diagnosis Date   Chicken pox    Claudication (HCC)    Diabetes 1.5, managed as type 1 (HCC)    Hypertension    Low back pain    PAD (peripheral artery disease) (HCC)    Tobacco abuse     Family History  Problem Relation Age of Onset   Diabetes Mother    Atrial fibrillation Mother    Heart attack Father    Stroke Father    Arthritis Father    Heart disease Father    Diabetes Maternal Uncle    Diabetes Maternal Grandmother    Heart attack Maternal Grandmother    Heart disease Maternal Grandmother    Esophageal cancer Maternal Grandfather    Heart attack Sister        at age 2    Past Surgical History:  Procedure Laterality Date   AMPUTATION Right 09/18/2020   Procedure: RIGHT BELOW KNEE AMPUTATION;  Surgeon: Nadara Mustard, MD;  Location: [facility] OR;  Service: Orthopedics;  Laterality: Right;   AORTA - BILATERAL FEMORAL ARTERY BYPASS GRAFT Bilateral 09/11/2020   Procedure: AORTA BIFEMORAL BYPASS GRAFT;  Surgeon: Larina Earthly, MD;  Location: [facility] OR;  Service: Vascular;  Laterality: Bilateral;   CATARACT EXTRACTION Bilateral    FEMORAL-POPLITEAL BYPASS GRAFT Right 09/11/2020   Procedure: RIGHT FEMORAL-POPLITEAL ARTERY BYPASS GRAFTING;  Surgeon: Larina Earthly, MD;  Location: [facility] OR;  Service: Vascular;  Laterality: Right;   INTRAOCULAR LENS INSERTION Bilateral    RETINAL DETACHMENT SURGERY Bilateral    Social History   Occupational History   Not on file  Tobacco Use   Smoking status: Every Day    Current packs/day: 0.75    Average packs/day: 0.8 packs/day for 42.0 years (31.5 ttl pk-yrs)    Types: Cigarettes   Smokeless tobacco:  Never  Vaping Use   Vaping status: Never Used  Substance and Sexual Activity   Alcohol use: No    Alcohol/week: 0.0 standard drinks of alcohol   Drug use: Not Currently   Sexual activity: Not on file

## 2023-03-27 ENCOUNTER — Telehealth: Payer: Self-pay | Admitting: Orthopedic Surgery

## 2023-03-27 NOTE — Telephone Encounter (Signed)
Patient called asked if a Rx can be faxed over to [facility] for Compression sock for him. The number to contact patient is [phone]

## 2023-03-28 ENCOUNTER — Other Ambulatory Visit: Payer: Self-pay | Admitting: Endocrinology

## 2023-03-28 DIAGNOSIS — E1065 Type 1 diabetes mellitus with hyperglycemia: Secondary | ICD-10-CM

## 2023-03-29 NOTE — Telephone Encounter (Signed)
Faxed rx for liners and supplies for prosthetic. They do not carry the vive compression wear.

## 2023-05-01 ENCOUNTER — Other Ambulatory Visit: Payer: Self-pay | Admitting: Cardiovascular Disease

## 2023-06-02 ENCOUNTER — Encounter: Payer: Self-pay | Admitting: Endocrinology

## 2023-06-02 ENCOUNTER — Ambulatory Visit (INDEPENDENT_AMBULATORY_CARE_PROVIDER_SITE_OTHER): Payer: Medicare HMO | Admitting: Endocrinology

## 2023-06-02 VITALS — BP 150/70 | HR 70 | Resp 20 | Ht 71.0 in | Wt 128.2 lb

## 2023-06-02 DIAGNOSIS — E1065 Type 1 diabetes mellitus with hyperglycemia: Secondary | ICD-10-CM | POA: Diagnosis not present

## 2023-06-02 DIAGNOSIS — E871 Hypo-osmolality and hyponatremia: Secondary | ICD-10-CM

## 2023-06-02 LAB — POCT GLYCOSYLATED HEMOGLOBIN (HGB A1C): Hemoglobin A1C: 8.3 % — AB (ref 4.0–5.6)

## 2023-06-02 MED ORDER — LANTUS SOLOSTAR 100 UNIT/ML ~~LOC~~ SOPN
PEN_INJECTOR | SUBCUTANEOUS | 3 refills | Status: DC
Start: 2023-06-02 — End: 2023-06-28

## 2023-06-02 MED ORDER — NOVOLOG FLEXPEN 100 UNIT/ML ~~LOC~~ SOPN: PEN_INJECTOR | SUBCUTANEOUS | 4 refills | Status: AC

## 2023-06-02 MED ORDER — DROPLET PEN NEEDLES 31G X 8 MM MISC: 1 refills | Status: AC

## 2023-06-02 MED ORDER — FREESTYLE LIBRE 14 DAY SENSOR MISC: 1.0000 [IU] | 4 refills | Status: AC

## 2023-06-02 NOTE — Patient Instructions (Signed)
No change on insulin except take upto 10 units of Novolog in case of high sugar or eating high carb meals.

## 2023-06-02 NOTE — Progress Notes (Unsigned)
Patient's Name: Peter Mendez.    DOB: 08-04-1961    MRN: [id number]                                                    REASON OF VISIT: Follow up of type 1 diabetes mellitus  PCP: Leilani Able, MD  HISTORY OF PRESENT ILLNESS:   Peter Mendez. is a 62 y.o. old male with past medical history listed below, is here for follow up for type 1 diabetes mellitus.   Pertinent Diabetes History: Patient was diagnosed with type 1 diabetes mellitus in 1991.  Chronic Diabetes Complications : Retinopathy: yes. Last ophthalmology exam was done on ***, following with ophthalmology regularly.  Nephropathy: ***, on ACE/ARB *** Peripheral neuropathy: ***, on *** Coronary artery disease: *** Stroke: ***  Relevant comorbidities and cardiovascular risk factors: Obesity: *** Body mass index is 17.88 kg/m.  Hypertension: Yes *** Hyperlipidemia : Yes, on statin ***  Current / Home Diabetic regimen includes: Lantus 30 units at 7 AM and 8 units at 7 PM. NovoLog 4 to 6 units with breakfast, 4 to 8 units with lunch and 4 to 8 units with supper.  Prior diabetic medications:  Glycemic data:    CONTINUOUS GLUCOSE MONITORING SYSTEM (CGMS) INTERPRETATION: At today's visit, we reviewed CGM downloads. The full report is scanned in the media. Reviewing the CGM trends, blood glucose are as follows:  Dexcom G7 CGM-  Sensor Download (Sensor download was reviewed and summarized below.) Dates: ***  Glucose Management Indicator: ***% Sensor Average: *** SD *** Sensor usage : *** %  Glycemic Trends:  <54: ***% 54-70: ***% 71-180: ***% 181-250: ***% 251-400: ***%  Interpretation: - ***  FreeStyle Libre 2 CGM-  Sensor Download (Sensor download was reviewed and summarized below.) Dates: October 12 to June 02, 2023, 14 days. Sensor Average: 187 Glucose Management Indicator: 7.8% Glucose Variability: 26.4%  % data captured: 97  Glycemic Trends:  <54: 0% 54-70: 0% 71-180: 45% 181-250:  47% 251-400: 8%   Interpretation: -Mostly acceptable blood sugar with occasional hyperglycemia with blood sugar up to 260s related with meals.  Rarely blood sugar up to 300s postprandially.  Rare hypoglycemia with blood sugar 55 on October 23 and blood sugar 65 on October 14 in the afternoon.  No significant hypoglycemia.  Hypoglycemia: Patient has *** hypoglycemic episodes. Patient has hypoglycemia awareness, has a glucagon ER kit and diabetic alert device ***.   Factors modifying glucose control: 1.  Diabetic diet assessment: Inconsistently scheduled.  3 meals a day.  2.  Staying active or exercising: ***  3.  Medication compliance: compliant *** of the time.  Interval history  ***  REVIEW OF SYSTEMS As per history of present illness.   PAST MEDICAL HISTORY: Past Medical History:  Diagnosis Date   Chicken pox    Claudication (HCC)    Diabetes 1.5, managed as type 1 (HCC)    Hypertension    Low back pain    PAD (peripheral artery disease) (HCC)    Tobacco abuse     PAST SURGICAL HISTORY: Past Surgical History:  Procedure Laterality Date   AMPUTATION Right 09/18/2020   Procedure: RIGHT BELOW KNEE AMPUTATION;  Surgeon: Nadara Mustard, MD;  Location: [facility] OR;  Service: Orthopedics;  Laterality: Right;   AORTA - BILATERAL FEMORAL ARTERY BYPASS GRAFT  Bilateral 09/11/2020   Procedure: AORTA BIFEMORAL BYPASS GRAFT;  Surgeon: Larina Earthly, MD;  Location: [facility] OR;  Service: Vascular;  Laterality: Bilateral;   CATARACT EXTRACTION Bilateral    FEMORAL-POPLITEAL BYPASS GRAFT Right 09/11/2020   Procedure: RIGHT FEMORAL-POPLITEAL ARTERY BYPASS GRAFTING;  Surgeon: Larina Earthly, MD;  Location: [facility] OR;  Service: Vascular;  Laterality: Right;   INTRAOCULAR LENS INSERTION Bilateral    RETINAL DETACHMENT SURGERY Bilateral     ALLERGIES: Allergies  Allergen Reactions   Heparin Other (See Comments)    Heparin (SRA positive)   Pravastatin Sodium Other (See Comments)    myalgia    FAMILY  HISTORY:  Family History  Problem Relation Age of Onset   Diabetes Mother    Atrial fibrillation Mother    Heart attack Father    Stroke Father    Arthritis Father    Heart disease Father    Diabetes Maternal Uncle    Diabetes Maternal Grandmother    Heart attack Maternal Grandmother    Heart disease Maternal Grandmother    Esophageal cancer Maternal Grandfather    Heart attack Sister        at age 29    SOCIAL HISTORY: Social History   Socioeconomic History   Marital status: Single    Spouse name: Not on file   Number of children: Not on file   Years of education: Not on file   Highest education level: Not on file  Occupational History   Not on file  Tobacco Use   Smoking status: Every Day    Current packs/day: 0.75    Average packs/day: 0.8 packs/day for 42.0 years (31.5 ttl pk-yrs)    Types: Cigarettes   Smokeless tobacco: Never  Vaping Use   Vaping status: Never Used  Substance and Sexual Activity   Alcohol use: No    Alcohol/week: 0.0 standard drinks of alcohol   Drug use: Not Currently   Sexual activity: Not on file  Other Topics Concern   Not on file  Social History Narrative   Not on file   Social Determinants of Health   Financial Resource Strain: Not on file  Food Insecurity: Not on file  Transportation Needs: Not on file  Physical Activity: Not on file  Stress: Not on file  Social Connections: Not on file    MEDICATIONS:  Current Outpatient Medications  Medication Sig Dispense Refill   Accu-Chek Softclix Lancets lancets Use to check blood sugar 3 times a day before meals 300 each 3   acetaminophen (TYLENOL) 500 MG tablet Take 1,000 mg by mouth every 6 (six) hours as needed for mild pain or headache.     ALPRAZolam (XANAX) 1 MG tablet Take 1 mg by mouth 3 (three) times daily.     amLODipine (NORVASC) 5 MG tablet Take 1 tablet (5 mg total) by mouth daily. 90 tablet 3   aspirin EC 81 MG EC tablet Take 1 tablet (81 mg total) by mouth daily.  Swallow whole. 30 tablet 11   Blood Glucose Monitoring Suppl (ACCU-CHEK GUIDE) w/Device KIT Use to check blood sugar daily 1 kit 2   fluconazole (DIFLUCAN) 150 MG tablet Take 1 tablet (150 mg total) by mouth once a week. 2 tablet 0   gabapentin (NEURONTIN) 300 MG capsule Take 1 capsule (300 mg total) by mouth 3 (three) times daily. 270 capsule 3   glucose blood (ACCU-CHEK GUIDE) test strip Use to check blood sugar 6 times a day before meals 600 each  3   nicotine (NICODERM CQ - DOSED IN MG/24 HOURS) 21 mg/24hr patch Place 1 patch (21 mg total) onto the skin daily. 28 patch 1   omeprazole (PRILOSEC) 20 MG capsule Take 20 mg by mouth daily.     oxyCODONE-acetaminophen (PERCOCET) 10-325 MG tablet Take 1 tablet by mouth 3 (three) times daily.     ramipril (ALTACE) 10 MG capsule TAKE ONE CAPSULE BY MOUTH ONCE DAILY. 90 capsule 3   rosuvastatin (CRESTOR) 40 MG tablet TAKE 1 TABLET BY MOUTH DAILY. 30 tablet 0   Urea POWD Dissolve 15 g in any liquid and take once daily 2500 g 1   XARELTO 20 MG TABS tablet TAKE ONE TABLET BY MOUTH ONCE DAILY WITH SUPPER. 90 tablet 0   Continuous Glucose Sensor (FREESTYLE LIBRE 14 DAY SENSOR) MISC 1 Units by Does not apply route every 14 (fourteen) days. 6 each 4   insulin aspart (NOVOLOG FLEXPEN) 100 UNIT/ML FlexPen INJECT 4-10 UNITS INTO THE SKIN 3 TIMES DAILY WITH MEALS. 30 mL 4   insulin glargine (LANTUS SOLOSTAR) 100 UNIT/ML Solostar Pen INJECT 30 UNITS INTO THE SKIN IN THE MORNING AN 8 UNITS IN THE EVENING. 45 mL 3   Insulin Pen Needle (DROPLET PEN NEEDLES) 31G X 8 MM MISC USE TO INJECT  INSULIN FIVE TIMES DAILY 500 each 1   No current facility-administered medications for this visit.    PHYSICAL EXAM: Vitals:   06/02/23 0906  BP: (!) 150/70  Pulse: 70  Resp: 20  SpO2: 99%  Weight: 128 lb 3.2 oz (58.2 kg)  Height: 5\' 11"  (1.803 m)   Body mass index is 17.88 kg/m.  Wt Readings from Last 3 Encounters:  06/02/23 128 lb 3.2 oz (58.2 kg)  02/28/23 124 lb 3.2  oz (56.3 kg)  11/21/22 129 lb 9.6 oz (58.8 kg)    General: Well developed, well nourished male in no apparent distress.  HEENT: AT/[place], no external lesions.  Eyes: Conjunctiva clear and no icterus. Neck: Neck supple  Lungs: Respirations not labored Neurologic: Alert, oriented, normal speech Extremities / Skin: Dry. No sores or rashes noted. No acanthosis nigricans Psychiatric: Does not appear depressed or anxious  Diabetic Foot Exam: Monofilament sensory exam intact / decreased b/l, no callus, no ulceration, Dorsalis Pedis 2+ b/l  Diabetic Foot Exam - Simple   No data filed     Diabetic Foot Examination {AMB DIABETIC FOOT ZOXW:96045}  LABS Reviewed Lab Results  Component Value Date   HGBA1C 8.3 (A) 06/02/2023   HGBA1C 8.3 (A) 02/28/2023   HGBA1C 8.3 (A) 11/21/2022   No results found for: "FRUCTOSAMINE" Lab Results  Component Value Date   CHOL 148 08/09/2022   HDL 59.60 08/09/2022   LDLCALC 79 08/09/2022   TRIG 46.0 08/09/2022   CHOLHDL 2 08/09/2022   Lab Results  Component Value Date   MICRALBCREAT 7.3 02/28/2023   MICRALBCREAT 11.4 10/14/2021   Lab Results  Component Value Date   CREATININE 0.69 02/28/2023   Lab Results  Component Value Date   GFR 98.47 02/28/2023    ASSESSMENT / PLAN  1. Uncontrolled type 1 diabetes mellitus with hyperglycemia (HCC)     Diabetes Mellitus type ***, complicated by *** - Diabetic status / severity: ***  Lab Results  Component Value Date   HGBA1C 8.3 (A) 06/02/2023    - Hemoglobin A1c goal : <***%  - Medications: ***  I) II) III) IV)  - Home glucose testing: *** - Discussed/ Gave Hypoglycemia treatment plan.  #  Consult : not required at this time. ***  # Annual urine for microalbuminuria/ creatinine ratio, no microalbuminuria currently, continue ACE/ARB *** Last  Lab Results  Component Value Date   MICRALBCREAT 7.3 02/28/2023    # Foot check nightly / neuropathy, continue ***  # Annual dilated  diabetic eye exams.   - Diet: {dmclinicdiabeticdiettype:42746::"Make healthy diabetic food choices","Eat reasonable portion sizes to promote a healthy weight"} - Life style / activity / exercise: {dmclinicactivity:42747}  2. Blood pressure  -  BP Readings from Last 1 Encounters:  06/02/23 (!) 150/70    - Control {IS/IS NOT:9024::"is"} in target.  - {endo rb hypertension recommendations:35631::"No change in current plans."}  3. Lipid status / Hyperlipidemia - Last  Lab Results  Component Value Date   LDLCALC 79 08/09/2022   - Continue ***   Diagnoses and all orders for this visit:  Uncontrolled type 1 diabetes mellitus with hyperglycemia (HCC) -     POCT glycosylated hemoglobin (Hb A1C) -     insulin glargine (LANTUS SOLOSTAR) 100 UNIT/ML Solostar Pen; INJECT 30 UNITS INTO THE SKIN IN THE MORNING AN 8 UNITS IN THE EVENING. -     insulin aspart (NOVOLOG FLEXPEN) 100 UNIT/ML FlexPen; INJECT 4-10 UNITS INTO THE SKIN 3 TIMES DAILY WITH MEALS.  Other orders -     Insulin Pen Needle (DROPLET PEN NEEDLES) 31G X 8 MM MISC; USE TO INJECT  INSULIN FIVE TIMES DAILY -     Continuous Glucose Sensor (FREESTYLE LIBRE 14 DAY SENSOR) MISC; 1 Units by Does not apply route every 14 (fourteen) days.    DISPOSITION Follow up in clinic in   months suggested.   All questions answered and patient verbalized understanding of the plan.  Peter Samentha Perham, MD [facility] [address] [place], [address] [place], [place] [address] Phone # [phone]  At least part of this note was generated using voice recognition software. Inadvertent word errors may have occurred, which were not recognized during the proofreading process.

## 2023-06-28 ENCOUNTER — Other Ambulatory Visit: Payer: Self-pay

## 2023-06-28 DIAGNOSIS — E1065 Type 1 diabetes mellitus with hyperglycemia: Secondary | ICD-10-CM

## 2023-06-28 MED ORDER — LANTUS SOLOSTAR 100 UNIT/ML ~~LOC~~ SOPN: PEN_INJECTOR | SUBCUTANEOUS | 3 refills | Status: AC

## 2023-08-29 ENCOUNTER — Ambulatory Visit: Payer: Medicare HMO | Admitting: Endocrinology

## 2023-11-22 ENCOUNTER — Telehealth: Payer: Self-pay

## 2023-11-22 NOTE — Telephone Encounter (Signed)
 Patient called and Mychart message sent to patient with instructions: Per MD please schedule appointment with office or current provider

## 2023-12-13 ENCOUNTER — Encounter: Payer: Self-pay | Admitting: *Deleted

## 2024-02-01 ENCOUNTER — Emergency Department (HOSPITAL_COMMUNITY)

## 2024-02-01 ENCOUNTER — Encounter (HOSPITAL_COMMUNITY): Payer: Self-pay

## 2024-02-01 ENCOUNTER — Emergency Department (HOSPITAL_COMMUNITY)
Admission: EM | Admit: 2024-02-01 | Discharge: 2024-02-01 | Disposition: A | Discharge status: Home / Self Care | Attending: Emergency Medicine | Admitting: Emergency Medicine

## 2024-02-01 DIAGNOSIS — M25521 Pain in right elbow: Secondary | ICD-10-CM | POA: Diagnosis present

## 2024-02-01 DIAGNOSIS — M25551 Pain in right hip: Secondary | ICD-10-CM | POA: Diagnosis not present

## 2024-02-01 DIAGNOSIS — M25511 Pain in right shoulder: Secondary | ICD-10-CM | POA: Insufficient documentation

## 2024-02-01 DIAGNOSIS — Z89511 Acquired absence of right leg below knee: Secondary | ICD-10-CM | POA: Diagnosis not present

## 2024-02-01 DIAGNOSIS — M7918 Myalgia, other site: Secondary | ICD-10-CM

## 2024-02-01 DIAGNOSIS — Y92410 Unspecified street and highway as the place of occurrence of the external cause: Secondary | ICD-10-CM | POA: Insufficient documentation

## 2024-02-01 DIAGNOSIS — Z794 Long term (current) use of insulin: Secondary | ICD-10-CM | POA: Diagnosis not present

## 2024-02-01 DIAGNOSIS — R001 Bradycardia, unspecified: Secondary | ICD-10-CM | POA: Diagnosis not present

## 2024-02-01 DIAGNOSIS — Z79899 Other long term (current) drug therapy: Secondary | ICD-10-CM | POA: Insufficient documentation

## 2024-02-01 DIAGNOSIS — I10 Essential (primary) hypertension: Secondary | ICD-10-CM | POA: Diagnosis not present
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
## 2024-02-01 NOTE — ED Notes (Signed)
 Patient transported to X-ray

## 2024-04-17 ENCOUNTER — Other Ambulatory Visit: Payer: Self-pay | Admitting: Cardiovascular Disease

## 2024-04-24 ENCOUNTER — Encounter

## 2024-05-07 ENCOUNTER — Other Ambulatory Visit: Payer: Self-pay | Admitting: Cardiovascular Disease
